# Patient Record
Sex: Female | Born: 1970
Health system: Southern US, Community
[De-identification: ages and names within clinical notes are randomized; demographics above are authoritative.]

## PROBLEM LIST (undated history)

## (undated) DIAGNOSIS — M1990 Unspecified osteoarthritis, unspecified site: Secondary | ICD-10-CM

## (undated) DIAGNOSIS — N83209 Unspecified ovarian cyst, unspecified side: Secondary | ICD-10-CM

## (undated) DIAGNOSIS — F419 Anxiety disorder, unspecified: Secondary | ICD-10-CM

## (undated) DIAGNOSIS — T7840XA Allergy, unspecified, initial encounter: Secondary | ICD-10-CM

## (undated) DIAGNOSIS — D649 Anemia, unspecified: Secondary | ICD-10-CM

## (undated) DIAGNOSIS — D219 Benign neoplasm of connective and other soft tissue, unspecified: Secondary | ICD-10-CM

## (undated) DIAGNOSIS — I10 Essential (primary) hypertension: Secondary | ICD-10-CM

## (undated) HISTORY — PX: CHOLECYSTECTOMY: SHX55

## (undated) HISTORY — PX: ELBOW SURGERY: SHX618

## (undated) HISTORY — DX: Unspecified ovarian cyst, unspecified side: N83.209

## (undated) HISTORY — DX: Anxiety disorder, unspecified: F41.9

## (undated) HISTORY — DX: Unspecified osteoarthritis, unspecified site: M19.90

## (undated) HISTORY — PX: TUBAL LIGATION: SHX77

## (undated) HISTORY — DX: Allergy, unspecified, initial encounter: T78.40XA

## (undated) HISTORY — DX: Benign neoplasm of connective and other soft tissue, unspecified: D21.9

## (undated) HISTORY — PX: COLONOSCOPY: SHX174

---

## 1998-01-23 ENCOUNTER — Other Ambulatory Visit: Admission: RE | Admit: 1998-01-23 | Discharge: 1998-01-23 | Payer: Self-pay | Admitting: Obstetrics and Gynecology

## 1998-04-02 ENCOUNTER — Emergency Department (HOSPITAL_COMMUNITY): Admission: EM | Admit: 1998-04-02 | Discharge: 1998-04-02 | Payer: Self-pay | Admitting: Emergency Medicine

## 1998-04-05 ENCOUNTER — Emergency Department (HOSPITAL_COMMUNITY): Admission: EM | Admit: 1998-04-05 | Discharge: 1998-04-05 | Payer: Self-pay | Admitting: Emergency Medicine

## 1999-01-14 ENCOUNTER — Other Ambulatory Visit: Admission: RE | Admit: 1999-01-14 | Discharge: 1999-01-14 | Payer: Self-pay | Admitting: *Deleted

## 1999-09-02 ENCOUNTER — Ambulatory Visit (HOSPITAL_BASED_OUTPATIENT_CLINIC_OR_DEPARTMENT_OTHER): Admission: RE | Admit: 1999-09-02 | Discharge: 1999-09-02 | Payer: Self-pay | Admitting: Orthopedic Surgery

## 1999-11-16 ENCOUNTER — Encounter: Admission: RE | Admit: 1999-11-16 | Discharge: 2000-02-14 | Payer: Self-pay | Admitting: Anesthesiology

## 2001-02-16 ENCOUNTER — Emergency Department (HOSPITAL_COMMUNITY): Admission: EM | Admit: 2001-02-16 | Discharge: 2001-02-16 | Payer: Self-pay | Admitting: *Deleted

## 2001-02-16 ENCOUNTER — Encounter: Payer: Self-pay | Admitting: Emergency Medicine

## 2001-11-07 ENCOUNTER — Other Ambulatory Visit: Admission: RE | Admit: 2001-11-07 | Discharge: 2001-11-07 | Payer: Self-pay | Admitting: Obstetrics and Gynecology

## 2004-01-26 ENCOUNTER — Encounter: Admission: RE | Admit: 2004-01-26 | Discharge: 2004-01-26 | Payer: Self-pay | Admitting: Sports Medicine

## 2005-09-03 ENCOUNTER — Emergency Department (HOSPITAL_COMMUNITY): Admission: EM | Admit: 2005-09-03 | Discharge: 2005-09-03 | Payer: Self-pay | Admitting: Emergency Medicine

## 2007-04-06 ENCOUNTER — Emergency Department (HOSPITAL_COMMUNITY): Admission: EM | Admit: 2007-04-06 | Discharge: 2007-04-06 | Payer: Self-pay | Admitting: Emergency Medicine

## 2009-07-25 ENCOUNTER — Emergency Department (HOSPITAL_COMMUNITY): Admission: EM | Admit: 2009-07-25 | Discharge: 2009-07-25 | Payer: Self-pay | Admitting: Family Medicine

## 2010-05-27 ENCOUNTER — Emergency Department (HOSPITAL_COMMUNITY): Admission: EM | Admit: 2010-05-27 | Discharge: 2010-05-27 | Payer: Self-pay | Admitting: Family Medicine

## 2010-06-17 ENCOUNTER — Emergency Department (HOSPITAL_COMMUNITY): Admission: EM | Admit: 2010-06-17 | Discharge: 2010-06-17 | Payer: Self-pay | Admitting: Family Medicine

## 2011-01-14 LAB — WET PREP, GENITAL
Clue Cells Wet Prep HPF POC: NONE SEEN
Trich, Wet Prep: NONE SEEN
Yeast Wet Prep HPF POC: NONE SEEN

## 2011-01-14 LAB — POCT URINALYSIS DIPSTICK
Glucose, UA: NEGATIVE mg/dL
Hgb urine dipstick: NEGATIVE
Ketones, ur: NEGATIVE mg/dL
Specific Gravity, Urine: >=1.03 (ref 1.005–1.030)
Urobilinogen, UA: 0.2 mg/dL (ref 0.0–1.0)

## 2011-01-14 LAB — GC/CHLAMYDIA PROBE AMP, GENITAL: GC Probe Amp, Genital: NEGATIVE

## 2011-01-15 LAB — POCT URINALYSIS DIP (DEVICE)
Bilirubin Urine: NEGATIVE
Nitrite: NEGATIVE
Protein, ur: NEGATIVE mg/dL
Urobilinogen, UA: 0.2 mg/dL (ref 0.0–1.0)
pH: 6.5 (ref 5.0–8.0)

## 2011-02-04 LAB — POCT URINALYSIS DIP (DEVICE)
Hgb urine dipstick: NEGATIVE
Nitrite: NEGATIVE
Protein, ur: NEGATIVE mg/dL
Urobilinogen, UA: 0.2 mg/dL (ref 0.0–1.0)
pH: 6.5 (ref 5.0–8.0)

## 2011-03-27 ENCOUNTER — Inpatient Hospital Stay (INDEPENDENT_AMBULATORY_CARE_PROVIDER_SITE_OTHER)
Admission: RE | Admit: 2011-03-27 | Discharge: 2011-03-27 | Disposition: A | Discharge status: Home / Self Care | Payer: 59 | Source: Ambulatory Visit | Attending: Emergency Medicine | Admitting: Emergency Medicine

## 2011-03-27 DIAGNOSIS — J069 Acute upper respiratory infection, unspecified: Secondary | ICD-10-CM

## 2011-06-06 ENCOUNTER — Inpatient Hospital Stay (INDEPENDENT_AMBULATORY_CARE_PROVIDER_SITE_OTHER)
Admission: RE | Admit: 2011-06-06 | Discharge: 2011-06-06 | Disposition: A | Discharge status: Home / Self Care | Payer: 59 | Source: Ambulatory Visit | Attending: Family Medicine | Admitting: Family Medicine

## 2011-06-06 DIAGNOSIS — N760 Acute vaginitis: Secondary | ICD-10-CM

## 2011-06-06 DIAGNOSIS — A499 Bacterial infection, unspecified: Secondary | ICD-10-CM

## 2011-06-07 LAB — GC/CHLAMYDIA PROBE AMP, GENITAL: Chlamydia, DNA Probe: NEGATIVE

## 2011-07-31 ENCOUNTER — Emergency Department (HOSPITAL_COMMUNITY)
Admission: EM | Admit: 2011-07-31 | Discharge: 2011-07-31 | Disposition: A | Discharge status: Home / Self Care | Payer: 59 | Attending: Emergency Medicine | Admitting: Emergency Medicine

## 2011-07-31 ENCOUNTER — Emergency Department (HOSPITAL_COMMUNITY): Payer: 59

## 2011-07-31 DIAGNOSIS — R079 Chest pain, unspecified: Secondary | ICD-10-CM | POA: Insufficient documentation

## 2011-07-31 DIAGNOSIS — Z79899 Other long term (current) drug therapy: Secondary | ICD-10-CM | POA: Insufficient documentation

## 2011-07-31 DIAGNOSIS — R110 Nausea: Secondary | ICD-10-CM | POA: Insufficient documentation

## 2011-07-31 DIAGNOSIS — I10 Essential (primary) hypertension: Secondary | ICD-10-CM | POA: Insufficient documentation

## 2011-07-31 DIAGNOSIS — M25519 Pain in unspecified shoulder: Secondary | ICD-10-CM | POA: Insufficient documentation

## 2011-07-31 LAB — POCT I-STAT, CHEM 8
BUN: 8 mg/dL (ref 6–23)
Creatinine, Ser: 0.7 mg/dL (ref 0.50–1.10)
Potassium: 2.9 mEq/L — ABNORMAL LOW (ref 3.5–5.1)
Sodium: 139 mEq/L (ref 135–145)

## 2011-07-31 LAB — CBC
MCV: 76.7 fL — ABNORMAL LOW (ref 78.0–100.0)
Platelets: 478 10*3/uL — ABNORMAL HIGH (ref 150–400)
RBC: 4.54 MIL/uL (ref 3.87–5.11)
RDW: 14.7 % (ref 11.5–15.5)
WBC: 7.6 10*3/uL (ref 4.0–10.5)

## 2011-07-31 LAB — BASIC METABOLIC PANEL
CO2: 27 mEq/L (ref 19–32)
Chloride: 101 mEq/L (ref 96–112)
Creatinine, Ser: 0.6 mg/dL (ref 0.50–1.10)
GFR calc Af Amer: >60 mL/min (ref >60)
Potassium: 3 mEq/L — ABNORMAL LOW (ref 3.5–5.1)

## 2011-07-31 LAB — DIFFERENTIAL
Basophils Absolute: 0 10*3/uL (ref 0.0–0.1)
Eosinophils Absolute: 0.1 10*3/uL (ref 0.0–0.7)
Lymphocytes Relative: 30 % (ref 12–46)
Lymphs Abs: 2.2 10*3/uL (ref 0.7–4.0)
Neutrophils Relative %: 63 % (ref 43–77)

## 2011-07-31 LAB — POCT I-STAT TROPONIN I
Troponin i, poc: 0.01 ng/mL (ref 0.00–0.08)
Troponin i, poc: 0.01 ng/mL (ref 0.00–0.08)

## 2011-07-31 LAB — POCT PREGNANCY, URINE: Preg Test, Ur: NEGATIVE

## 2011-09-18 ENCOUNTER — Emergency Department (INDEPENDENT_AMBULATORY_CARE_PROVIDER_SITE_OTHER)
Admission: EM | Admit: 2011-09-18 | Discharge: 2011-09-18 | Disposition: A | Discharge status: Home / Self Care | Payer: 59 | Source: Home / Self Care | Attending: Family Medicine | Admitting: Family Medicine

## 2011-09-18 ENCOUNTER — Encounter: Payer: Self-pay | Admitting: *Deleted

## 2011-09-18 DIAGNOSIS — L02219 Cutaneous abscess of trunk, unspecified: Secondary | ICD-10-CM

## 2011-09-18 DIAGNOSIS — L02211 Cutaneous abscess of abdominal wall: Secondary | ICD-10-CM

## 2011-09-18 HISTORY — DX: Essential (primary) hypertension: I10

## 2011-09-18 MED ORDER — FLUCONAZOLE 200 MG PO TABS: 200.0000 mg | ORAL_TABLET | Freq: Every day | ORAL | Status: AC

## 2011-09-18 MED ORDER — HYDROCODONE-ACETAMINOPHEN 5-325 MG PO TABS: 1.0000 | ORAL_TABLET | Freq: Three times a day (TID) | ORAL | Status: AC | PRN

## 2011-09-18 MED ORDER — DOXYCYCLINE HYCLATE 100 MG PO CAPS: 100.0000 mg | ORAL_CAPSULE | Freq: Two times a day (BID) | ORAL | Status: AC

## 2011-09-18 NOTE — ED Provider Notes (Signed)
History     CSN: [id number] Arrival date & time: 09/18/2011  5:46 PM   First MD Initiated Contact with Patient 09/18/11 1710      Chief Complaint  Patient presents with  . Recurrent Skin Infections    ABCESS RIGHT UPPER ABD ONSET X 3 DAYS  DRAINING X ONE REMAINS PAINFUL     (Consider location/radiation/quality/duration/timing/severity/associated sxs/prior treatment) Patient is a 40 y.o. female presenting with abscess. The history is provided by the patient.  Abscess  This is a new problem. The current episode started less than one week ago. The onset was gradual. Progression since onset: STARTED TO DRAIN TODAY. Affected Location: INFERIOR TO RIGHT BREAST. The problem is mild.  SHE STATES IT STARTED TO DRAIN TODAY. HX OF PRIOR EPISODES OF BOILS  Past Medical History  Diagnosis Date  . Hypertension     Past Surgical History  Procedure Date  . Cholecystectomy   . Elbow surgery     History reviewed. No pertinent family history.  History  Substance Use Topics  . Smoking status: Never Smoker   . Smokeless tobacco: Not on file  . Alcohol Use: No    OB History    Grav Para Term Preterm Abortions TAB SAB Ect Mult Living                  Review of Systems  Constitutional: Negative.   HENT: Negative.   Respiratory: Negative.   Cardiovascular: Negative.   Gastrointestinal: Negative.     Allergies  Review of patient's allergies indicates no known allergies.  Home Medications   Current Outpatient Rx  Name Route Sig Dispense Refill  . AMLODIPINE BESYLATE-VALSARTAN 10-160 MG PO TABS Oral Take 1 tablet by mouth daily.      Marland Kitchen FERROUS FUMARATE 325 (106 FE) MG PO TABS Oral Take 1 tablet by mouth.      Marland Kitchen HYDROCHLOROTHIAZIDE 25 MG PO TABS Oral Take 25 mg by mouth daily.      Marland Kitchen DOXYCYCLINE HYCLATE 100 MG PO CAPS Oral Take 1 capsule (100 mg total) by mouth 2 (two) times daily. 20 capsule 0  . HYDROCODONE-ACETAMINOPHEN 5-325 MG PO TABS Oral Take 1 tablet by mouth every 8  (eight) hours as needed for pain. 12 tablet 0    BP 110/61  Pulse 66  Temp(Src) 97.6 F (36.4 C) (Oral)  Resp 19  SpO2 98%  LMP 09/09/2011  Physical Exam  Nursing note and vitals reviewed. Constitutional: She appears well-developed and well-nourished.  HENT:  Head: Normocephalic.  Cardiovascular: Normal rate and regular rhythm.   Pulmonary/Chest: Effort normal and breath sounds normal.  Skin:       DRAINING ABSCESS RIGHT UPPER QUAD OF THE ABS. SURROUNDING INDURATION.     ED Course  Procedures (including critical care time)  Labs Reviewed - No data to display No results found.   1. Abscess of abdominal wall       MDM          Randa Spike, MD 09/18/11 1806

## 2012-01-30 ENCOUNTER — Emergency Department (HOSPITAL_COMMUNITY)
Admission: EM | Admit: 2012-01-30 | Discharge: 2012-01-30 | Disposition: A | Discharge status: Home / Self Care | Payer: 59 | Attending: Emergency Medicine | Admitting: Emergency Medicine

## 2012-01-30 ENCOUNTER — Other Ambulatory Visit: Payer: Self-pay

## 2012-01-30 ENCOUNTER — Encounter (HOSPITAL_COMMUNITY): Payer: Self-pay | Admitting: *Deleted

## 2012-01-30 ENCOUNTER — Emergency Department (HOSPITAL_COMMUNITY): Payer: 59

## 2012-01-30 DIAGNOSIS — I10 Essential (primary) hypertension: Secondary | ICD-10-CM | POA: Insufficient documentation

## 2012-01-30 DIAGNOSIS — R0602 Shortness of breath: Secondary | ICD-10-CM | POA: Insufficient documentation

## 2012-01-30 DIAGNOSIS — R109 Unspecified abdominal pain: Secondary | ICD-10-CM | POA: Insufficient documentation

## 2012-01-30 DIAGNOSIS — R079 Chest pain, unspecified: Secondary | ICD-10-CM | POA: Insufficient documentation

## 2012-01-30 DIAGNOSIS — R10819 Abdominal tenderness, unspecified site: Secondary | ICD-10-CM | POA: Insufficient documentation

## 2012-01-30 LAB — BASIC METABOLIC PANEL
BUN: 7 mg/dL (ref 6–23)
Calcium: 9.6 mg/dL (ref 8.4–10.5)
GFR calc non Af Amer: >90 mL/min (ref >90)
Glucose, Bld: 95 mg/dL (ref 70–99)

## 2012-01-30 LAB — CBC
HCT: 37.7 % (ref 36.0–46.0)
Hemoglobin: 12.3 g/dL (ref 12.0–15.0)
MCH: 25.4 pg — ABNORMAL LOW (ref 26.0–34.0)
MCHC: 32.6 g/dL (ref 30.0–36.0)
MCV: 77.7 fL — ABNORMAL LOW (ref 78.0–100.0)

## 2012-01-30 LAB — HEPATIC FUNCTION PANEL
Alkaline Phosphatase: 71 U/L (ref 39–117)
Total Bilirubin: 0.4 mg/dL (ref 0.3–1.2)

## 2012-01-30 LAB — LIPASE, BLOOD: Lipase: 21 U/L (ref 11–59)

## 2012-01-30 LAB — URINALYSIS, ROUTINE W REFLEX MICROSCOPIC
Bilirubin Urine: NEGATIVE
Glucose, UA: NEGATIVE mg/dL
Hgb urine dipstick: NEGATIVE
Protein, ur: NEGATIVE mg/dL
Specific Gravity, Urine: 1.019 (ref 1.005–1.030)
Urobilinogen, UA: 0.2 mg/dL (ref 0.0–1.0)

## 2012-01-30 MED ORDER — ONDANSETRON HCL 4 MG/2ML IJ SOLN
4.0000 mg | Freq: Once | INTRAMUSCULAR | Status: AC
  Administered 2012-01-30: 4 mg via INTRAVENOUS
  Filled 2012-01-30: qty 2

## 2012-01-30 MED ORDER — IBUPROFEN 600 MG PO TABS: 600.0000 mg | ORAL_TABLET | Freq: Four times a day (QID) | ORAL | Status: AC | PRN

## 2012-01-30 MED ORDER — MORPHINE SULFATE 4 MG/ML IJ SOLN
4.0000 mg | Freq: Once | INTRAMUSCULAR | Status: AC
  Administered 2012-01-30: 4 mg via INTRAVENOUS
  Filled 2012-01-30: qty 1

## 2012-01-30 MED ORDER — ASPIRIN 81 MG PO CHEW
324.0000 mg | CHEWABLE_TABLET | Freq: Once | ORAL | Status: AC
  Administered 2012-01-30: 324 mg via ORAL
  Filled 2012-01-30: qty 4

## 2012-01-30 MED ORDER — KETOROLAC TROMETHAMINE 30 MG/ML IJ SOLN
30.0000 mg | Freq: Once | INTRAMUSCULAR | Status: AC
  Administered 2012-01-30: 30 mg via INTRAVENOUS
  Filled 2012-01-30: qty 1

## 2012-01-30 MED ORDER — SODIUM CHLORIDE 0.9 % IV BOLUS (SEPSIS)
1000.0000 mL | Freq: Once | INTRAVENOUS | Status: AC
  Administered 2012-01-30: 1000 mL via INTRAVENOUS

## 2012-01-30 MED ORDER — POTASSIUM CHLORIDE 20 MEQ/15ML (10%) PO LIQD
60.0000 meq | Freq: Once | ORAL | Status: AC
  Administered 2012-01-30: 60 meq via ORAL
  Filled 2012-01-30: qty 45
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
## 2012-01-30 NOTE — ED Notes (Signed)
Several attempts to place iv-pt unable to relax for placement-MD notified
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
## 2012-01-30 NOTE — Discharge Instructions (Signed)
Abdominal Pain (Nonspecific) Your exam might not show the exact reason you have abdominal pain. Since there are many different causes of abdominal pain, another checkup and more tests may be needed. It is very important to follow up for lasting (persistent) or worsening symptoms. A possible cause of abdominal pain in any person who still has his or her appendix is acute appendicitis. Appendicitis is often hard to diagnose. Normal blood tests, urine tests, ultrasound, and CT scans do not completely rule out early appendicitis or other causes of abdominal pain. Sometimes, only the changes that happen over time will allow appendicitis and other causes of abdominal pain to be determined. Other potential problems that may require surgery may also take time to become more apparent. Because of this, it is important that you follow all of the instructions below. HOME CARE INSTRUCTIONS   Rest as much as possible.   Do not eat solid food until your pain is gone.   While adults or children have pain: A diet of water, weak decaffeinated tea, broth or bouillon, gelatin, oral rehydration solutions (ORS), frozen ice pops, or ice chips may be helpful.   When pain is gone in adults or children: Start a light diet (dry toast, crackers, applesauce, or white rice). Increase the diet slowly as long as it does not bother you. Eat no dairy products (including cheese and eggs) and no spicy, fatty, fried, or high-fiber foods.   Use no alcohol, caffeine, or cigarettes.   Take your regular medicines unless your caregiver told you not to.   Take any prescribed medicine as directed.   Only take over-the-counter or prescription medicines for pain, discomfort, or fever as directed by your caregiver. Do not give aspirin to children.  If your caregiver has given you a follow-up appointment, it is very important to keep that appointment. Not keeping the appointment could result in a permanent injury and/or lasting (chronic) pain  and/or disability. If there is any problem keeping the appointment, you must call to reschedule.  SEEK IMMEDIATE MEDICAL CARE IF:   Your pain is not gone in 24 hours.   Your pain becomes worse, changes location, or feels different.   You or your child has an oral temperature above 102 F (38.9 C), not controlled by medicine.   Your baby is older than 3 months with a rectal temperature of 102 F (38.9 C) or higher.   Your baby is 35 months old or younger with a rectal temperature of 100.4 F (38 C) or higher.   You have shaking chills.   You keep throwing up (vomiting) or cannot drink liquids.   There is blood in your vomit or you see blood in your bowel movements.   Your bowel movements become dark or black.   You have frequent bowel movements.   Your bowel movements stop (become blocked) or you cannot pass gas.   You have bloody, frequent, or painful urination.   You have yellow discoloration in the skin or whites of the eyes.   Your stomach becomes bloated or bigger.   You have dizziness or fainting.   You have chest or back pain.  MAKE SURE YOU:   Understand these instructions.   Will watch your condition.   Will get help right away if you are not doing well or get worse.  Document Released: 10/17/2005 Document Revised: 10/06/2011 Document Reviewed: 09/14/2009 [facility] Patient Information 2012 [place], [place].  Chest Pain (Nonspecific) It is often hard to give a specific diagnosis  for the cause of chest pain. There is always a chance that your pain could be related to something serious, such as a heart attack or a blood clot in the lungs. You need to follow up with your caregiver for further evaluation. CAUSES   Heartburn.   Pneumonia or bronchitis.   Anxiety or stress.   Inflammation around your heart (pericarditis) or lung (pleuritis or pleurisy).   A blood clot in the lung.   A collapsed lung (pneumothorax). It can develop suddenly on its own  (spontaneous pneumothorax) or from injury (trauma) to the chest.   Shingles infection (herpes zoster virus).  The chest wall is composed of bones, muscles, and cartilage. Any of these can be the source of the pain.  The bones can be bruised by injury.   The muscles or cartilage can be strained by coughing or overwork.   The cartilage can be affected by inflammation and become sore (costochondritis).  DIAGNOSIS  Lab tests or other studies, such as X-rays, electrocardiography, stress testing, or cardiac imaging, may be needed to find the cause of your pain.  TREATMENT   Treatment depends on what may be causing your chest pain. Treatment may include:   Acid blockers for heartburn.   Anti-inflammatory medicine.   Pain medicine for inflammatory conditions.   Antibiotics if an infection is present.   You may be advised to change lifestyle habits. This includes stopping smoking and avoiding alcohol, caffeine, and chocolate.   You may be advised to keep your head raised (elevated) when sleeping. This reduces the chance of acid going backward from your stomach into your esophagus.   Most of the time, nonspecific chest pain will improve within 2 to 3 days with rest and mild pain medicine.  HOME CARE INSTRUCTIONS   If antibiotics were prescribed, take your antibiotics as directed. Finish them even if you start to feel better.   For the next few days, avoid physical activities that bring on chest pain. Continue physical activities as directed.   Do not smoke.   Avoid drinking alcohol.   Only take over-the-counter or prescription medicine for pain, discomfort, or fever as directed by your caregiver.   Follow your caregiver's suggestions for further testing if your chest pain does not go away.   Keep any follow-up appointments you made. If you do not go to an appointment, you could develop lasting (chronic) problems with pain. If there is any problem keeping an appointment, you must call  to reschedule.  SEEK MEDICAL CARE IF:   You think you are having problems from the medicine you are taking. Read your medicine instructions carefully.   Your chest pain does not go away, even after treatment.   You develop a rash with blisters on your chest.  SEEK IMMEDIATE MEDICAL CARE IF:   You have increased chest pain or pain that spreads to your arm, neck, jaw, back, or abdomen.   You develop shortness of breath, an increasing cough, or you are coughing up blood.   You have severe back or abdominal pain, feel nauseous, or vomit.   You develop severe weakness, fainting, or chills.   You have a fever.  THIS IS AN EMERGENCY. Do not wait to see if the pain will go away. Get medical help at once. Call your local emergency services (911 in [place] Do not drive yourself to the hospital. MAKE SURE YOU:   Understand these instructions.   Will watch your condition.   Will get help right  away if you are not doing well or get worse.  Document Released: 07/27/2005 Document Revised: 10/06/2011 Document Reviewed: 05/22/2008 [facility] Patient Information 2012 [place], [place].   RESOURCE GUIDE  Dental Problems  Patients with Medicaid: [facility] [phone] [address]                                           [phone] Phone:  [phone]                                                   Phone:  [phone]  If unable to pay or uninsured, contact:  [facility] or [facility]. to become qualified for the adult dental clinic.  Chronic Pain Problems Contact [facility]  [phone] Patients need to be referred by their primary care doctor.  Insufficient Money for Medicine Contact [employer]:  call "211" or [facility] [phone].  No Primary Care Doctor Call [facility]  [phone] Other agencies that provide inexpensive medical care    [facility]  [phone]    [facility]  [phone]    [facility]  [phone]    [facility]  [phone]    [facility]  [phone]    [facility]  [phone]  Psychological Services [facility]  [phone] [facility] [place]  [phone] [facility]   [phone] (emergency services [phone])  Abuse/Neglect [facility] Child Abuse Hotline [phone] [facility] Child Abuse Hotline (715)113-4495 (After Hours)  Emergency Shelter [facility] [phone]  M[facility] Homes [facility] [place] [facility] [phone] [place] Alert Services [phone]  MRSA Hotline #:   [phone]    [facility] Resources  [facility] [place]  [employer]                           [facility] [address] [place]                     [address]         [address]  Blondell Reveal Phone:  [phone]                                  Phone:  [phone]                   Phone:  [phone]  [facility] Phone:  [phone]  [facility] Child Abuse Hotline [phone] [phone] (After Hours)

## 2012-08-10 ENCOUNTER — Emergency Department (HOSPITAL_COMMUNITY)
Admission: EM | Admit: 2012-08-10 | Discharge: 2012-08-10 | Disposition: A | Discharge status: Home / Self Care | Payer: 59 | Source: Home / Self Care | Attending: Emergency Medicine | Admitting: Emergency Medicine

## 2012-08-10 ENCOUNTER — Encounter (HOSPITAL_COMMUNITY): Payer: Self-pay | Admitting: *Deleted

## 2012-08-10 DIAGNOSIS — S335XXA Sprain of ligaments of lumbar spine, initial encounter: Secondary | ICD-10-CM

## 2012-08-10 DIAGNOSIS — S39012A Strain of muscle, fascia and tendon of lower back, initial encounter: Secondary | ICD-10-CM

## 2012-08-10 HISTORY — DX: Anemia, unspecified: D64.9

## 2012-08-10 LAB — POCT URINALYSIS DIP (DEVICE)
Bilirubin Urine: NEGATIVE
Nitrite: NEGATIVE
Specific Gravity, Urine: 1.025 (ref 1.005–1.030)
pH: 6.5 (ref 5.0–8.0)

## 2012-08-10 MED ORDER — METHOCARBAMOL 500 MG PO TABS: 500.0000 mg | ORAL_TABLET | Freq: Three times a day (TID) | ORAL | Status: DC

## 2012-08-10 MED ORDER — TRAMADOL HCL 50 MG PO TABS: 100.0000 mg | ORAL_TABLET | Freq: Three times a day (TID) | ORAL | Status: DC | PRN

## 2012-08-10 MED ORDER — MELOXICAM 15 MG PO TABS: 15.0000 mg | ORAL_TABLET | Freq: Every day | ORAL | Status: DC

## 2012-08-10 NOTE — ED Notes (Signed)
Started with lower back pain Wed while at work; went to PCP that afternoon - was told urine was completely neg, and pain was likely from a pulled muscle (patient started Zumba 3 wks ago); was told to take 800mg  IBU, but it's not helping.  Pain is worse when reaching or bending.  Denies any radiculopathy or parasthesias.

## 2012-08-10 NOTE — ED Provider Notes (Signed)
Chief Complaint  Patient presents with  . Back Pain    History of Present Illness:   The patient is a 41 year old female who has had a three-day history of pain in the lower lumbar spine around the waistline. 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 The pain is rated as 7 to 8/10 in intensity. It's worse with bending and better with heat. There is no radiation down the legs, no numbness, tingling, weakness, dysuria, frequency, urgency, hematuria, or urinary incontinence. There is no incontinence of stool or saddle anesthesia. No fever, chills, unintentional weight loss, or abdominal pain. The patient's primary care physician, Dr. Archie Endo at onset of pain and she had a urinalysis which is normal. She was treated with ibuprofen, but feels no better.  Review of Systems:  Other than noted above, the patient denies any of the following symptoms: Systemic:  No fever, chills, severe fatigue, or unexplained weight loss. GI:  No abdominal pain, nausea, vomiting, diarrhea, constipation, incontinence of bowel, or blood in stool. GU:  No dysuria, frequency, urgency, or hematuria. No incontinence of urine or difficulty urinating.  M-S:  No neck pain, joint pain, arthritis, or myalgias. Neuro:  No paresthesias, saddle anesthesia, muscular weakness, or progressive neurological deficit.  PMFSH:  Past medical history, family history, social history, meds, and allergies were reviewed. Specifically, there is no history of cancer, major trauma, osteoporosis, immunosuppression, HIV, or IV or injection drug use.   Physical Exam:   Vital signs:  BP 139/82  Pulse 82  Temp 99.1 F (37.3 C) (Oral)  Resp 18  SpO2 100%  LMP 07/23/2012 General:  Alert, oriented, in no distress. Abdomen:  Soft, non-tender.  No organomegaly or mass.  No pulsatile midline abdominal mass or bruit. Back:  There is pain to palpation bilaterally at the waistline area. Her back had a full range of motion  with slight pain. Straight leg raising was negative. Lasegue's sign was negative. Neuro:  Normal muscle strength, sensations and DTRs. Extremities: Pedal pulses were full, there was no edema. Skin:  Clear, warm and dry.  No rash.  Labs:   Results for orders placed during the hospital encounter of 08/10/12  POCT URINALYSIS DIP (DEVICE)      Component Value Range   Glucose, UA NEGATIVE  NEGATIVE mg/dL   Bilirubin Urine NEGATIVE  NEGATIVE   Ketones, ur NEGATIVE  NEGATIVE mg/dL   Specific Gravity, Urine 1.025  1.005 - 1.030   Hgb urine dipstick SMALL (*) NEGATIVE   pH 6.5  5.0 - 8.0   Protein, ur 30 (*) NEGATIVE mg/dL   Urobilinogen, UA 0.2  0.0 - 1.0 mg/dL   Nitrite NEGATIVE  NEGATIVE   Leukocytes, UA NEGATIVE  NEGATIVE  POCT PREGNANCY, URINE      Component Value Range   Preg Test, Ur NEGATIVE  NEGATIVE    Assessment:  The encounter diagnosis was Lumbar strain.  Plan:   1.  The following meds were prescribed:   New Prescriptions   MELOXICAM (MOBIC) 15 MG TABLET    Take 1 tablet (15 mg total) by mouth daily.   METHOCARBAMOL (ROBAXIN) 500 MG TABLET    Take 1 tablet (500 mg total) by mouth 3 (three) times daily.   TRAMADOL (ULTRAM) 50 MG TABLET    Take 2 tablets (100 mg total) by mouth every 8 (eight) hours as needed for pain.   2.  The patient was instructed in symptomatic care and handouts were  given. 3.  The patient was told to return if becoming worse in any way, if no better in 2 weeks, and given some red flag symptoms that would indicate earlier return. 4.  The patient was encouraged to try to be as active as possible and given some exercises to do followed by moist heat.    Megan Likes, MD 08/10/12 Jerene Bears

## 2012-08-30 ENCOUNTER — Emergency Department (HOSPITAL_COMMUNITY): Payer: No Typology Code available for payment source

## 2012-08-30 ENCOUNTER — Emergency Department (HOSPITAL_COMMUNITY)
Admission: EM | Admit: 2012-08-30 | Discharge: 2012-08-30 | Disposition: A | Discharge status: Home / Self Care | Payer: No Typology Code available for payment source | Attending: Emergency Medicine | Admitting: Emergency Medicine

## 2012-08-30 DIAGNOSIS — Z79899 Other long term (current) drug therapy: Secondary | ICD-10-CM | POA: Insufficient documentation

## 2012-08-30 DIAGNOSIS — D649 Anemia, unspecified: Secondary | ICD-10-CM | POA: Insufficient documentation

## 2012-08-30 DIAGNOSIS — M25529 Pain in unspecified elbow: Secondary | ICD-10-CM | POA: Insufficient documentation

## 2012-08-30 DIAGNOSIS — M549 Dorsalgia, unspecified: Secondary | ICD-10-CM | POA: Insufficient documentation

## 2012-08-30 DIAGNOSIS — Y9389 Activity, other specified: Secondary | ICD-10-CM | POA: Insufficient documentation

## 2012-08-30 DIAGNOSIS — Y92410 Unspecified street and highway as the place of occurrence of the external cause: Secondary | ICD-10-CM | POA: Insufficient documentation

## 2012-08-30 DIAGNOSIS — I10 Essential (primary) hypertension: Secondary | ICD-10-CM | POA: Insufficient documentation

## 2012-08-30 DIAGNOSIS — M542 Cervicalgia: Secondary | ICD-10-CM | POA: Insufficient documentation

## 2012-08-30 MED ORDER — ONDANSETRON HCL 4 MG PO TABS: 4.0000 mg | ORAL_TABLET | Freq: Once | ORAL | Status: DC

## 2012-08-30 MED ORDER — DIAZEPAM 5 MG/ML IJ SOLN
10.0000 mg | Freq: Once | INTRAMUSCULAR | Status: AC
  Administered 2012-08-30: 10 mg via INTRAMUSCULAR
  Filled 2012-08-30: qty 2

## 2012-08-30 MED ORDER — ONDANSETRON 4 MG PO TBDP
4.0000 mg | ORAL_TABLET | Freq: Once | ORAL | Status: AC
  Administered 2012-08-30: 4 mg via ORAL
  Filled 2012-08-30: qty 1

## 2012-08-30 MED ORDER — HYDROCODONE-ACETAMINOPHEN 5-325 MG PO TABS
2.0000 | ORAL_TABLET | Freq: Once | ORAL | Status: AC
  Administered 2012-08-30: 2 via ORAL
  Filled 2012-08-30: qty 2

## 2012-08-30 MED ORDER — HYDROCODONE-ACETAMINOPHEN 5-325 MG PO TABS: 2.0000 | ORAL_TABLET | Freq: Four times a day (QID) | ORAL | Status: DC | PRN

## 2012-08-30 MED ORDER — DIAZEPAM 5 MG PO TABS: 5.0000 mg | ORAL_TABLET | Freq: Two times a day (BID) | ORAL | Status: DC
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
## 2012-08-30 NOTE — ED Notes (Signed)
Pt is in xray
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
## 2012-08-31 ENCOUNTER — Emergency Department (INDEPENDENT_AMBULATORY_CARE_PROVIDER_SITE_OTHER)
Admission: EM | Admit: 2012-08-31 | Discharge: 2012-08-31 | Disposition: A | Discharge status: Home / Self Care | Payer: Self-pay | Source: Home / Self Care

## 2012-08-31 ENCOUNTER — Encounter (HOSPITAL_COMMUNITY): Payer: Self-pay

## 2012-08-31 DIAGNOSIS — S60229A Contusion of unspecified hand, initial encounter: Secondary | ICD-10-CM
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
## 2012-08-31 NOTE — ED Provider Notes (Signed)
Medical screening examination/treatment/procedure(s) were performed by non-physician practitioner and as supervising physician I was immediately available for consultation/collaboration.  Raynald Blend, MD 08/31/12 1719
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
## 2012-08-31 NOTE — ED Notes (Signed)
discussed wait time w patient

## 2012-08-31 NOTE — ED Provider Notes (Signed)
Medical screening examination/treatment/procedure(s) were performed by non-physician practitioner and as supervising physician I was immediately available for consultation/collaboration.  Flint Melter, MD 08/31/12 [phone]

## 2012-10-18 ENCOUNTER — Other Ambulatory Visit (HOSPITAL_COMMUNITY): Payer: Self-pay | Admitting: Family Medicine

## 2012-11-06 ENCOUNTER — Other Ambulatory Visit (HOSPITAL_COMMUNITY): Payer: Self-pay | Admitting: Family Medicine

## 2012-11-06 DIAGNOSIS — N920 Excessive and frequent menstruation with regular cycle: Secondary | ICD-10-CM

## 2013-02-07 ENCOUNTER — Emergency Department (HOSPITAL_COMMUNITY)
Admission: EM | Admit: 2013-02-07 | Discharge: 2013-02-07 | Disposition: A | Discharge status: Home / Self Care | Payer: 59 | Source: Home / Self Care | Attending: Emergency Medicine | Admitting: Emergency Medicine

## 2013-02-07 ENCOUNTER — Encounter (HOSPITAL_COMMUNITY): Payer: Self-pay | Admitting: *Deleted

## 2013-02-07 DIAGNOSIS — R21 Rash and other nonspecific skin eruption: Secondary | ICD-10-CM

## 2013-02-07 DIAGNOSIS — M67432 Ganglion, left wrist: Secondary | ICD-10-CM

## 2013-02-07 MED ORDER — SULFAMETHOXAZOLE-TRIMETHOPRIM 800-160 MG PO TABS: 1.0000 | ORAL_TABLET | Freq: Two times a day (BID) | ORAL | Status: DC

## 2013-02-07 NOTE — ED Provider Notes (Signed)
History     CSN: [id number]  Arrival date & time 02/07/13  1002   First MD Initiated Contact with Patient 02/07/13 1022      Chief Complaint  Patient presents with  . Insect Bite    (Consider location/radiation/quality/duration/timing/severity/associated sxs/prior treatment) The history is provided by the patient.    Past Medical History  Diagnosis Date  . Hypertension   . Anemia     Past Surgical History  Procedure Laterality Date  . Cholecystectomy    . Elbow surgery    . Tubal ligation      No family history on file.  History  Substance Use Topics  . Smoking status: Never Smoker   . Smokeless tobacco: Not on file  . Alcohol Use: No    OB History   Grav Para Term Preterm Abortions TAB SAB Ect Mult Living                  Review of Systems  Skin: Positive for rash.  All other systems reviewed and are negative.    Allergies  Review of patient's allergies indicates no known allergies.  Home Medications   Current Outpatient Rx  Name  Route  Sig  Dispense  Refill  . amLODipine-valsartan (EXFORGE) 10-160 MG per tablet   Oral   Take 1 tablet by mouth daily.           . ferrous fumarate (HEMOCYTE - 106 MG FE) 325 (106 FE) MG TABS   Oral   Take 1 tablet by mouth.           . fluconazole (DIFLUCAN) 150 MG tablet   Oral   Take 1 tablet (150 mg total) by mouth once.   1 tablet   0   . hydrochlorothiazide (HYDRODIURIL) 25 MG tablet   Oral   Take 25 mg by mouth daily.           Marland Kitchen HYDROcodone-acetaminophen (NORCO/VICODIN) 5-325 MG per tablet   Oral   Take 2 tablets by mouth every 6 (six) hours as needed for pain.   15 tablet   0   . meloxicam (MOBIC) 15 MG tablet   Oral   Take 1 tablet (15 mg total) by mouth daily.   15 tablet   0   . methocarbamol (ROBAXIN) 500 MG tablet   Oral   Take 1 tablet (500 mg total) by mouth 3 (three) times daily.   30 tablet   0   . norethindrone-ethinyl estradiol (MICROGESTIN,JUNEL,LOESTRIN) 1-20  MG-MCG tablet   Oral   Take 1 tablet by mouth daily.         Marland Kitchen sulfamethoxazole-trimethoprim (SEPTRA DS) 800-160 MG per tablet   Oral   Take 1 tablet by mouth every 12 (twelve) hours.   20 tablet   0   . traMADol (ULTRAM) 50 MG tablet   Oral   Take 2 tablets (100 mg total) by mouth every 8 (eight) hours as needed for pain.   30 tablet   0     BP 113/87  Pulse 71  Temp(Src) 99.4 F (37.4 C) (Oral)  Resp 18  SpO2 100%  LMP 02/04/2013  Physical Exam  Constitutional: She appears well-developed.  Musculoskeletal: Normal range of motion. She exhibits tenderness.  Small ganglion cyst left wrist  Skin: Skin is warm. Rash noted.   small blisters noted left lower leg  ED Course  Procedures (including critical care time)  Labs Reviewed - No data to display No results found.  1. Rash and nonspecific skin eruption   2. Ganglion cyst of wrist, left       MDM          Jani Files, MD 02/27/13 [phone]

## 2013-02-07 NOTE — ED Notes (Signed)
Pt  Presents  With  2  Issues  She noticed  A  Small    Bump  That itches  On lefet  Lower  Leg  About 1  Week  Ago      unknwn as  To  Etiology      She  Has  Been  Scratching it  And  Has  Become  Red  And  Irritated  Also  Noticed  sm  Bump on l  Wrist  Today     denys  specefic injury  But types  A  Lot

## 2013-02-12 ENCOUNTER — Telehealth (HOSPITAL_COMMUNITY): Payer: Self-pay | Admitting: *Deleted

## 2013-02-12 MED ORDER — FLUCONAZOLE 150 MG PO TABS: 150.0000 mg | ORAL_TABLET | Freq: Once | ORAL | Status: DC

## 2013-02-12 NOTE — ED Provider Notes (Signed)
Addendum: Patient had an allergic reaction to Septra, so this was stopped. She was switched to another antibiotic which caused a yeast infection. We'll send a prescription for Diflucan to her pharmacy.  Reuben Likes, MD 02/12/13 2100

## 2013-02-12 NOTE — ED Notes (Signed)
Pt. called on VM @ 1547 and said she had been switched to a different antibiotic due to allergic reaction to Septra DS.  She has developed a yeast infection from this antibiotic and requests Diflucan be sent to the [facility].  I called pt. one time to clarify her need and left a message. I listened to her voicemail and asked Dr. Lorenz Coaster if he would do the order.  He e-prescribed the Diflucan to the [facility]. I called pt. back and left her another message telling her what we sent and where to pick it up.  I told her to call back tomorrow if any further questions and left my number and [phone]. Vassie Moselle 02/12/2013

## 2013-02-12 NOTE — ED Notes (Signed)
Pt. called back and said someone took care of it earlier today and she had already taken the pill. I apologized for bothering her and explained that I did not see a note that it was handled or where the medication had been ordered. Vassie Moselle 02/12/2013

## 2013-06-02 ENCOUNTER — Emergency Department (HOSPITAL_COMMUNITY)
Admission: EM | Admit: 2013-06-02 | Discharge: 2013-06-02 | Disposition: A | Discharge status: Home / Self Care | Payer: 59 | Source: Home / Self Care | Attending: Emergency Medicine | Admitting: Emergency Medicine

## 2013-06-02 ENCOUNTER — Encounter (HOSPITAL_COMMUNITY): Payer: Self-pay | Admitting: *Deleted

## 2013-06-02 DIAGNOSIS — J310 Chronic rhinitis: Secondary | ICD-10-CM

## 2013-06-02 DIAGNOSIS — I10 Essential (primary) hypertension: Secondary | ICD-10-CM

## 2013-06-02 MED ORDER — AMLODIPINE BESYLATE-VALSARTAN 10-160 MG PO TABS: 1.0000 | ORAL_TABLET | Freq: Every day | ORAL | Status: DC

## 2013-06-02 MED ORDER — HYDROCHLOROTHIAZIDE 25 MG PO TABS: 25.0000 mg | ORAL_TABLET | Freq: Every day | ORAL | Status: DC

## 2013-06-02 NOTE — ED Notes (Signed)
States she will run out of her Exforge after today, and will need a refill on her HCTZ also until she is able to get appt with PCP in next 4 wks.  Also c/o post-nasal drainage, itchy throat (no pain), slight nasal congestion, sneezing over past 3 days.  Has been taking Alka Seltzer Cold Plus and Sudafed without relief.

## 2013-06-02 NOTE — ED Provider Notes (Signed)
Chief Complaint:   Chief Complaint  Patient presents with  . Medication Refill  . Allergic Rhinitis     History of Present Illness:   Megan Mitchell is a 42 year old female who has had a 3 to four-day history of post nasal drip, scratchy throat, sneezing, headache, and a dry cough. She denies any fever, chills, nasal congestion, rhinorrhea, swollen glands, wheezing, chest pain, or GI symptoms. She has no known sick exposures and denies any history of asthma or allergies.  She also needs refills on her blood pressure medicine. Her primary care physician, Dr. Archie Endo, is out of the state for a couple of months. She needs refills on Exforge 10/160 and hydrochlorothiazide 25 mg per day.  Review of Systems:  Other than noted above, the patient denies any of the following symptoms: Systemic:  No fevers, chills, sweats, weight loss or gain, fatigue, or tiredness. Eye:  No redness or discharge. ENT:  No ear pain, drainage, headache, nasal congestion, drainage, sinus pressure, difficulty swallowing, or sore throat. Neck:  No neck pain or swollen glands. Lungs:  No cough, sputum production, hemoptysis, wheezing, chest tightness, shortness of breath or chest pain. GI:  No abdominal pain, nausea, vomiting or diarrhea.  PMFSH:  Past medical history, family history, social history, meds, and allergies were reviewed. She's allergic to sulfa. She has high blood pressure. She takes an iron supplement for anemia.  Physical Exam:   Vital signs:  BP 131/84  Pulse 76  Temp(Src) 98.3 F (36.8 C) (Oral)  Resp 16  SpO2 97%  LMP 05/27/2013 General:  Alert and oriented.  In no distress.  Skin warm and dry. Eye:  No conjunctival injection or drainage. Lids were normal. ENT:  TMs and canals were normal, without erythema or inflammation.  Nasal mucosa was clear and uncongested, without drainage.  Mucous membranes were moist.  Pharynx was clear with no exudate or drainage.  There were no oral ulcerations or  lesions. Neck:  Supple, no adenopathy, tenderness or mass. Lungs:  No respiratory distress.  Lungs were clear to auscultation, without wheezes, rales or rhonchi.  Breath sounds were clear and equal bilaterally.  Heart:  Regular rhythm, without gallops, murmers or rubs. Skin:  Clear, warm, and dry, without rash or lesions.  Assessment:  The primary encounter diagnosis was Rhinitis. A diagnosis of Hypertension was also pertinent to this visit.  This could be a virus or could be allergic rhinitis. I have suggested she use over-the-counter antihistamines and nasal sprays, throat lozenges and hot salt water gargles. Return if no improvement in a total of 7-10 days.  She was provided with enough refills on her blood pressure medicine to last for 2 months.  Plan:   1.  The following meds were prescribed:   Discharge Medication List as of 06/02/2013  9:33 AM    START taking these medications   Details  !! amLODipine-valsartan (EXFORGE) 10-160 MG per tablet Take 1 tablet by mouth daily., Starting 06/02/2013, Until Discontinued, Normal    !! hydrochlorothiazide (HYDRODIURIL) 25 MG tablet Take 1 tablet (25 mg total) by mouth daily., Starting 06/02/2013, Until Discontinued, Normal     !! - Potential duplicate medications found. Please discuss with provider.     2.  The patient was instructed in symptomatic care and handouts were given. 3.  The patient was told to return if becoming worse in any way, if no better in 3 or 4 days, and given some red flag symptoms such as fever or  difficulty breathing that would indicate earlier return. 4.  Follow up here if necessary.      Reuben Likes, MD 06/02/13 [phone]

## 2013-07-30 ENCOUNTER — Encounter (HOSPITAL_COMMUNITY): Payer: Self-pay | Admitting: Emergency Medicine

## 2013-07-30 ENCOUNTER — Emergency Department (HOSPITAL_COMMUNITY)
Admission: EM | Admit: 2013-07-30 | Discharge: 2013-07-30 | Disposition: A | Discharge status: Home / Self Care | Payer: 59 | Source: Home / Self Care

## 2013-07-30 DIAGNOSIS — I10 Essential (primary) hypertension: Secondary | ICD-10-CM

## 2013-07-30 MED ORDER — HYDROCHLOROTHIAZIDE 25 MG PO TABS: 25.0000 mg | ORAL_TABLET | Freq: Every day | ORAL | Status: DC

## 2013-07-30 MED ORDER — AMLODIPINE BESYLATE-VALSARTAN 10-160 MG PO TABS: 1.0000 | ORAL_TABLET | Freq: Every day | ORAL | Status: DC

## 2013-07-30 NOTE — ED Notes (Signed)
Reports needing refill on bp med. Exforge. Unable to see primary due to balance owed. Pt states will run out on Thursday. Pt voices no concerns at this time.

## 2013-07-30 NOTE — ED Provider Notes (Signed)
CSN: [id number]     Arrival date & time 07/30/13  1751 History   First MD Initiated Contact with Patient 07/30/13 1850     Chief Complaint  Patient presents with  . Medication Refill    needs refill on BP medication.    (Consider location/radiation/quality/duration/timing/severity/associated sxs/prior Treatment) HPI Comments: 42 year old obese female with a history of hypertension presents to the urgent care to have her medications refilled. She states she has her PCP money and they will not see her. She was here a little over 2 months ago for the same reason and received his two-month supply. She has been told by the PCP that she needs lab work and an office visit to continue her blood pressure medication. She is having no complaints.   Past Medical History  Diagnosis Date  . Hypertension   . Anemia    Past Surgical History  Procedure Laterality Date  . Cholecystectomy    . Elbow surgery    . Tubal ligation     History reviewed. No pertinent family history. History  Substance Use Topics  . Smoking status: Never Smoker   . Smokeless tobacco: Not on file  . Alcohol Use: No   OB History   Grav Para Term Preterm Abortions TAB SAB Ect Mult Living                 Review of Systems  HENT: Negative.   Respiratory: Negative.   Cardiovascular: Negative.   Gastrointestinal: Negative.   Genitourinary: Negative.   Neurological: Negative.   All other systems reviewed and are negative.    Allergies  Sulfa antibiotics  Home Medications   Current Outpatient Rx  Name  Route  Sig  Dispense  Refill  . amLODipine-valsartan (EXFORGE) 10-160 MG per tablet   Oral   Take 1 tablet by mouth daily.           . hydrochlorothiazide (HYDRODIURIL) 25 MG tablet   Oral   Take 25 mg by mouth daily.           . hydrochlorothiazide (HYDRODIURIL) 25 MG tablet   Oral   Take 1 tablet (25 mg total) by mouth daily.   30 tablet   1   . meloxicam (MOBIC) 15 MG tablet   Oral   Take 1  tablet (15 mg total) by mouth daily.   15 tablet   0   . norethindrone-ethinyl estradiol (MICROGESTIN,JUNEL,LOESTRIN) 1-20 MG-MCG tablet   Oral   Take 1 tablet by mouth daily.         Marland Kitchen amLODipine-valsartan (EXFORGE) 10-160 MG per tablet   Oral   Take 1 tablet by mouth daily.   30 tablet   1   . amLODipine-valsartan (EXFORGE) 10-160 MG per tablet   Oral   Take 1 tablet by mouth daily.   30 tablet   0   . ferrous fumarate (HEMOCYTE - 106 MG FE) 325 (106 FE) MG TABS   Oral   Take 1 tablet by mouth.           . fluconazole (DIFLUCAN) 150 MG tablet   Oral   Take 1 tablet (150 mg total) by mouth once.   1 tablet   0   . hydrochlorothiazide (HYDRODIURIL) 25 MG tablet   Oral   Take 1 tablet (25 mg total) by mouth daily.   30 tablet   0   . HYDROcodone-acetaminophen (NORCO/VICODIN) 5-325 MG per tablet   Oral   Take 2 tablets by mouth  every 6 (six) hours as needed for pain.   15 tablet   0   . methocarbamol (ROBAXIN) 500 MG tablet   Oral   Take 1 tablet (500 mg total) by mouth 3 (three) times daily.   30 tablet   0   . sulfamethoxazole-trimethoprim (SEPTRA DS) 800-160 MG per tablet   Oral   Take 1 tablet by mouth every 12 (twelve) hours.   20 tablet   0   . traMADol (ULTRAM) 50 MG tablet   Oral   Take 2 tablets (100 mg total) by mouth every 8 (eight) hours as needed for pain.   30 tablet   0    BP 135/85  Pulse 70  Temp(Src) 98.3 F (36.8 C) (Oral)  Resp 14  SpO2 98%  LMP 07/23/2013 Physical Exam  Nursing note and vitals reviewed. Constitutional: She is oriented to person, place, and time. She appears well-developed and well-nourished. No distress.  Neck: Normal range of motion. Neck supple.  Pulmonary/Chest: Effort normal and breath sounds normal.  Musculoskeletal: She exhibits no edema.  Neurological: She is alert and oriented to person, place, and time. She exhibits normal muscle tone.  Skin: No rash noted.    ED Course  Procedures  (including critical care time) Labs Review Labs Reviewed - No data to display Imaging Review No results found.  MDM   1. HTN (hypertension)     Rx for Exforge 10/161 daily #30 Rx for HCTZ 25 mg one daily for blood pressure #30. Followup with your PCP soon as possible.   Hayden Rasmussen, NP 07/30/13 [phone]

## 2013-07-30 NOTE — ED Provider Notes (Signed)
Medical screening examination/treatment/procedure(s) were performed by non-physician practitioner and as supervising physician I was immediately available for consultation/collaboration.  Leslee Home, M.D.  Reuben Likes, MD 07/30/13 2250

## 2013-09-01 ENCOUNTER — Emergency Department (INDEPENDENT_AMBULATORY_CARE_PROVIDER_SITE_OTHER)
Admission: EM | Admit: 2013-09-01 | Discharge: 2013-09-01 | Disposition: A | Discharge status: Home / Self Care | Payer: 59 | Source: Home / Self Care

## 2013-09-01 ENCOUNTER — Encounter (HOSPITAL_COMMUNITY): Payer: Self-pay | Admitting: Emergency Medicine

## 2013-09-01 DIAGNOSIS — N921 Excessive and frequent menstruation with irregular cycle: Secondary | ICD-10-CM

## 2013-09-01 DIAGNOSIS — N920 Excessive and frequent menstruation with regular cycle: Secondary | ICD-10-CM

## 2013-09-01 DIAGNOSIS — I10 Essential (primary) hypertension: Secondary | ICD-10-CM

## 2013-09-01 MED ORDER — AMLODIPINE BESYLATE-VALSARTAN 10-160 MG PO TABS: 1.0000 | ORAL_TABLET | Freq: Every day | ORAL | Status: DC

## 2013-09-01 MED ORDER — HYDROCHLOROTHIAZIDE 25 MG PO TABS: 25.0000 mg | ORAL_TABLET | Freq: Every day | ORAL | Status: DC

## 2013-09-01 MED ORDER — NORETHIN ACE-ETH ESTRAD-FE 1.5-30 MG-MCG PO TABS: 1.0000 | ORAL_TABLET | Freq: Every day | ORAL | Status: DC

## 2013-09-01 NOTE — ED Notes (Signed)
Pt  Here  For  Refill    Of her meds       She  denys   Any         Physical  Symptoms

## 2013-09-01 NOTE — ED Provider Notes (Signed)
Chief Complaint:   Chief Complaint  Patient presents with  . 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 She presents today for refills on her blood pressure medication. She cannot get in to see her primary care physician since she has an outstanding balance. She has been on Exforge 10/160 once a day and hydrochlorothiazide 25 mg once a day. She also takes a birth control pill, Larin/FE 1.5/30, for cycle control. She has a tubal ligation, so she does not need it for birth control. She denies any side effects with any of these medications and she has had no headaches, dizziness, blurry vision, shortness of breath, chest pain, tightness, pressure, ankle edema, or strokelike symptoms. She denies any history of diabetes, elevated cholesterol, renal disease, or cigarette smoking. She does watch her salt intake and is trying to lose weight. She plans to get in to see her primary care physician in the near future. She has had blood work done within the past year.  Review of Systems:  Other than noted above, the patient denies any of the following symptoms: Systemic:  No fever, chills, fatigue, weight loss or gain. Respiratory:  No coughing, wheezing, or shortness of breath. Cardiac:  No chest pain, tightness, pressure, palpitations, syncope, or edema. Neuro:  No headache, dizziness, blurred vision, weakness, paresthesias, or strokelike symptoms.  PMFSH:  Past medical history, family history, social history, meds, and allergies were reviewed.  Physical Exam:   Vital signs:  BP 129/69  Pulse 80  Temp(Src) 99 F (37.2 C) (Oral)  Resp 18  SpO2 100%  LMP 08/20/2013 General:  Alert, oriented, in no distress. Lungs:  Breath sounds clear and equal bilaterally.  No wheezes, rales, or rhonchi. Heart:  Regular rhythm, no gallops, murmers, clicks or rubs.  Abdomen:  Soft and flat.  Nontender,  no organomegaly or mass.  No pulsatile midline abdominal mass or bruit. Ext:  No edema, pulses full. Neurological exam:  Alert and oriented.  Speech is clear.  No pronator drift.  CNs intact.  Assessment:  The primary encounter diagnosis was Hypertension. A diagnosis of Menometrorrhagia was also pertinent to this visit.  Plan:   1.  Meds:  The following meds were prescribed:   Discharge Medication List as of 09/01/2013  2:45 PM    START taking these medications   Details  !! amLODipine-valsartan (EXFORGE) 10-160 MG per tablet Take 1 tablet by mouth daily., Starting 09/01/2013, Until Discontinued, Normal    !! hydrochlorothiazide (HYDRODIURIL) 25 MG tablet Take 1 tablet (25 mg total) by mouth daily., Starting 09/01/2013, Until Discontinued, Normal    norethindrone-ethinyl estradiol-iron (LARIN FE 1.5/30) 1.5-30 MG-MCG tablet Take 1 tablet by mouth daily., Starting 09/01/2013, Until Discontinued, Normal     !! - Potential duplicate medications found. Please discuss with provider.      2.  Patient Education/Counseling:  The patient was given appropriate handouts, self care instructions, and instructed in symptomatic relief. Specifically discussed salt and sodium restriction, weight control, and exercise.  3.  Follow up:  The patient was told to follow up if no better in 3 to 4 days, if becoming worse in any way, and given some red flag symptoms such as shortness of breath or chest pain which would prompt immediate return.  Follow up with Dr. Merri Brunette as soon as possible.     Reuben Likes, MD  09/01/13 2019 

## 2014-01-27 ENCOUNTER — Ambulatory Visit (INDEPENDENT_AMBULATORY_CARE_PROVIDER_SITE_OTHER): Payer: 59 | Admitting: Internal Medicine

## 2014-01-27 ENCOUNTER — Ambulatory Visit: Payer: 59

## 2014-01-27 VITALS — BP 105/81 | HR 75 | Temp 98.0°F | Resp 18 | Ht 68.25 in | Wt 273.0 lb

## 2014-01-27 DIAGNOSIS — M25529 Pain in unspecified elbow: Secondary | ICD-10-CM

## 2014-01-27 DIAGNOSIS — S5000XA Contusion of unspecified elbow, initial encounter: Secondary | ICD-10-CM

## 2014-01-27 MED ORDER — MELOXICAM 15 MG PO TABS: 15.0000 mg | ORAL_TABLET | Freq: Every day | ORAL | Status: DC

## 2014-01-27 NOTE — Progress Notes (Signed)
   Subjective:    Patient ID: HIYA POINT, female    DOB: 1971/06/10, 43 y.o.   MRN: [id number]  HPIhit elbow (lateral aspect) yesterday--ok after Woke today with pain lateral L elbow worse with touching or moving  There are no active problems to display for this patient.     Review of Systems noncontr    Objective:   Physical Exam BP 105/81  Pulse 75  Temp(Src) 98 F (36.7 C) (Oral)  Resp 18  Ht 5' 8.25" (1.734 m)  Wt 273 lb (123.832 kg)  BMI 41.18 kg/m2  SpO2 98%  LMP 01/13/2014  Tender over Lateral epicondyle with lack of full extension due to pain, but no swelling or ecchymoses and full sup and pron w/out pain//grip full      [facility] reading (PRIMARY) by  Dr. Stasia Cavalier acute fracture. ?defect at medial joint margin.   Assessment & Plan:  Pain, elbow - Plan: DG Elbow Complete Left  Contusion, elbow - Plan: DG Elbow Complete Left  Meds ordered this encounter  Medications  . meloxicam (MOBIC) 15 MG tablet    Sig: Take 1 tablet (15 mg total) by mouth daily.    Dispense:  30 tablet    Refill:  0   ROM Ice F/u 7 days

## 2014-03-22 ENCOUNTER — Ambulatory Visit (INDEPENDENT_AMBULATORY_CARE_PROVIDER_SITE_OTHER): Payer: 59 | Admitting: Internal Medicine

## 2014-03-22 VITALS — BP 122/75 | HR 72 | Temp 98.8°F | Resp 18 | Wt 268.0 lb

## 2014-03-22 DIAGNOSIS — R05 Cough: Secondary | ICD-10-CM

## 2014-03-22 DIAGNOSIS — R059 Cough, unspecified: Secondary | ICD-10-CM

## 2014-03-22 DIAGNOSIS — I10 Essential (primary) hypertension: Secondary | ICD-10-CM | POA: Insufficient documentation

## 2014-03-22 DIAGNOSIS — J329 Chronic sinusitis, unspecified: Secondary | ICD-10-CM

## 2014-03-22 DIAGNOSIS — J301 Allergic rhinitis due to pollen: Secondary | ICD-10-CM

## 2014-03-22 MED ORDER — AMLODIPINE BESYLATE-VALSARTAN 10-160 MG PO TABS: 1.0000 | ORAL_TABLET | Freq: Every day | ORAL | Status: DC

## 2014-03-22 MED ORDER — AMOXICILLIN 875 MG PO TABS: 875.0000 mg | ORAL_TABLET | Freq: Two times a day (BID) | ORAL | Status: DC

## 2014-03-22 MED ORDER — PREDNISONE 20 MG PO TABS: ORAL_TABLET | ORAL | Status: DC

## 2014-03-22 MED ORDER — HYDROCODONE-HOMATROPINE 5-1.5 MG/5ML PO SYRP: 5.0000 mL | ORAL_SOLUTION | Freq: Four times a day (QID) | ORAL | Status: DC | PRN

## 2014-03-22 NOTE — Progress Notes (Signed)
Subjective:    Patient ID: Megan Mitchell, female    DOB: 04-23-1971, 43 y.o.   MRN: [id number]  URI  Associated symptoms include coughing, ear pain, sneezing and a sore throat.   This chart was scribed for Tami Lin, MD by Ladene Artist, ED Scribe. The patient was seen in room 9. Patient's care was started at 8:20 AM.  HPI Comments: Megan Mitchell is a 43 y.o. female who presents to the [facility] complaining of sneezing onset 1 week ago. She reports associated sore throat, productive cough, ear pain, eye itching, eye discharge. She denies fever. Pt has a h/o allergies. She has tried Claritin, Robitussin and Mucinex with no relief.   Past Medical History  Diagnosis Date  . Hypertension   . Anemia    Current Outpatient Prescriptions on File Prior to Visit  Medication Sig Dispense Refill  . amLODipine-valsartan (EXFORGE) 10-160 MG per tablet Take 1 tablet by mouth daily.        . ferrous fumarate (HEMOCYTE - 106 MG FE) 325 (106 FE) MG TABS Take 1 tablet by mouth.        . hydrochlorothiazide (HYDRODIURIL) 25 MG tablet Take 25 mg by mouth daily.        . norethindrone-ethinyl estradiol-iron (LARIN FE 1.5/30) 1.5-30 MG-MCG tablet Take 1 tablet by mouth daily.  1 Package  11  . HYDROcodone-acetaminophen (NORCO/VICODIN) 5-325 MG per tablet Take 2 tablets by mouth every 6 (six) hours as needed for pain.  15 tablet  0   No current facility-administered medications on file prior to visit.   Allergies  Allergen Reactions  . Sulfa Antibiotics   she also wants refill exforge and will sch f/u w/ her MD  Review of Systems  Constitutional: Negative for fever.  HENT: Positive for ear pain, sneezing and sore throat.   Eyes: Positive for discharge and itching.  Respiratory: Positive for cough.   Allergic/Immunologic: Positive for environmental allergies.      Objective:   Physical Exam  Nursing note and vitals reviewed. Constitutional: She is oriented to  person, place, and time. She appears well-developed and well-nourished. No distress.  HENT:  Head: Normocephalic and atraumatic.  Right Ear: Tympanic membrane normal.  Left Ear: Tympanic membrane normal.  Nose: Rhinorrhea present.  Mouth/Throat: Oropharynx is clear and moist and mucous membranes are normal.  Eyes: EOM are normal. Right conjunctiva is injected.  Neck: Neck supple.  Cardiovascular: Normal rate.   Pulmonary/Chest: Effort normal and breath sounds normal. No respiratory distress. She has no wheezes.  Musculoskeletal: Normal range of motion.  Lymphadenopathy:    She has cervical adenopathy (small but tender).  Neurological: She is alert and oriented to person, place, and time.  Skin: Skin is warm and dry.  Psychiatric: She has a normal mood and affect. Her behavior is normal.      Assessment & Plan:  Sinusitis - Plan: amoxicillin (AMOXIL) 875 MG tablet  Allergic rhinitis due to pollen - Plan: predniSONE (DELTASONE) 20 MG tablet  Cough - Plan: HYDROcodone-homatropine (HYCODAN) 5-1.5 MG/5ML syrup  HTN (hypertension)  Severe obesity (BMI >= 40)  Meds ordered this encounter  Medications  . amoxicillin (AMOXIL) 875 MG tablet    Sig: Take 1 tablet (875 mg total) by mouth 2 (two) times daily.    Dispense:  20 tablet    Refill:  0  . predniSONE (DELTASONE) 20 MG tablet    Sig: 3/3/2/2/1/1 single daily dose for 6 days  Dispense:  12 tablet    Refill:  0  . HYDROcodone-homatropine (HYCODAN) 5-1.5 MG/5ML syrup    Sig: Take 5 mLs by mouth every 6 (six) hours as needed for cough.    Dispense:  120 mL    Refill:  0  . amLODipine-valsartan (EXFORGE) 10-160 MG per tablet    Sig: Take 1 tablet by mouth daily.    Dispense:  90 tablet    Refill:  1     I personally performed the services described in this documentation, which was scribed in my presence. The recorded information has been reviewed and is accurate.

## 2014-03-24 ENCOUNTER — Telehealth: Payer: Self-pay | Admitting: Internal Medicine

## 2014-03-24 MED ORDER — FLUCONAZOLE 150 MG PO TABS: 150.0000 mg | ORAL_TABLET | Freq: Once | ORAL | Status: DC

## 2014-03-24 NOTE — Telephone Encounter (Signed)
Done

## 2014-03-24 NOTE — Telephone Encounter (Signed)
LMVM that medication was called into the pharmacy as requested.  Please call back if you have any further questions or concerns.

## 2014-03-24 NOTE — Telephone Encounter (Signed)
Patient was on an antibiotic and now has a yeast infection. States that she wants Diflucan called in to [place]

## 2014-05-30 ENCOUNTER — Other Ambulatory Visit: Payer: Self-pay

## 2014-05-30 MED ORDER — HYDROCHLOROTHIAZIDE 25 MG PO TABS: 25.0000 mg | ORAL_TABLET | Freq: Every day | ORAL | Status: DC

## 2014-05-30 NOTE — Telephone Encounter (Signed)
Dr Laney Pastor, you saw pt 03/22/14 and wrote for another BP med, but you haven't Rxd the HCTZ previously (Dr Philipp Deputy has in the past). Do you want to Rx for pt? I verified w/pt that she wants it to go to [facility] outpt, not [facility].

## 2014-05-30 NOTE — Telephone Encounter (Signed)
PT WOULD LIKE A REFILL ON HCTZ, SHE IS CURRENTLY COMPLETELY OUT. Megan Mitchell

## 2014-08-15 ENCOUNTER — Ambulatory Visit (INDEPENDENT_AMBULATORY_CARE_PROVIDER_SITE_OTHER): Payer: 59 | Admitting: Emergency Medicine

## 2014-08-15 VITALS — BP 108/64 | HR 68 | Temp 99.3°F | Resp 16 | Ht 67.5 in | Wt 265.0 lb

## 2014-08-15 DIAGNOSIS — B373 Candidiasis of vulva and vagina: Secondary | ICD-10-CM

## 2014-08-15 DIAGNOSIS — R1900 Intra-abdominal and pelvic swelling, mass and lump, unspecified site: Secondary | ICD-10-CM

## 2014-08-15 DIAGNOSIS — B3731 Acute candidiasis of vulva and vagina: Secondary | ICD-10-CM

## 2014-08-15 DIAGNOSIS — R350 Frequency of micturition: Secondary | ICD-10-CM

## 2014-08-15 LAB — POCT WET PREP WITH KOH
Clue Cells Wet Prep HPF POC: NEGATIVE
KOH PREP POC: POSITIVE
RBC Wet Prep HPF POC: NEGATIVE
TRICHOMONAS UA: NEGATIVE
YEAST WET PREP PER HPF POC: POSITIVE

## 2014-08-15 LAB — POCT URINALYSIS DIPSTICK
BILIRUBIN UA: NEGATIVE
GLUCOSE UA: NEGATIVE
KETONES UA: NEGATIVE
LEUKOCYTES UA: NEGATIVE
NITRITE UA: NEGATIVE
Protein, UA: NEGATIVE
Spec Grav, UA: >=1.03
Urobilinogen, UA: 2
pH, UA: 7

## 2014-08-15 LAB — POCT UA - MICROSCOPIC ONLY
Bacteria, U Microscopic: NEGATIVE
Casts, Ur, LPF, POC: NEGATIVE
Crystals, Ur, HPF, POC: NEGATIVE
Mucus, UA: NEGATIVE
WBC, UR, HPF, POC: NEGATIVE
YEAST UA: NEGATIVE

## 2014-08-15 LAB — POCT URINE PREGNANCY: PREG TEST UR: NEGATIVE

## 2014-08-15 MED ORDER — NORETHIN ACE-ETH ESTRAD-FE 1.5-30 MG-MCG PO TABS: 1.0000 | ORAL_TABLET | Freq: Every day | ORAL | Status: DC

## 2014-08-15 MED ORDER — FLUCONAZOLE 150 MG PO TABS: 150.0000 mg | ORAL_TABLET | Freq: Once | ORAL | Status: DC

## 2014-08-15 NOTE — Progress Notes (Addendum)
Subjective:    Patient ID: Megan Mitchell, female    DOB: October 30, 1971, 43 y.o.   MRN: [id number] This chart was scribed for Arlyss Queen, MD by Marti Sleigh, Medical Scribe. This patient was seen in Room 12 and the patient's care was started at 4:49 PM.  HPI HPI Comments: Megan SANTOYO is a 43 y.o. female who presents to [facility] complaining of dysuria that started two days ago. Pt got up one time last night to urinate. Pt is on birth control, and states her period is normal. Pt states she will see her PCP in the second week of December. Pt states her pcp told her she would order an ultrasound at her next visit.   Review of Systems  Genitourinary: Positive for dysuria.       Objective:   Physical Exam  Nursing note and vitals reviewed. Constitutional: She is oriented to person, place, and time. She appears well-developed and well-nourished.  HENT:  Head: Normocephalic and atraumatic.  Eyes: Pupils are equal, round, and reactive to light.  Neck: No JVD present.  Cardiovascular: Normal rate and regular rhythm.   Pulmonary/Chest: Effort normal and breath sounds normal. No respiratory distress.  Abdominal: Bowel sounds are normal. There is no tenderness.  Palpable irregular uterus.   Neurological: She is alert and oriented to person, place, and time.  Skin: Skin is warm and dry.  Psychiatric: She has a normal mood and affect. Her behavior is normal.   Results for orders placed in visit on 08/15/14  POCT UA - MICROSCOPIC ONLY      Result Value Ref Range   WBC, Ur, HPF, POC neg     RBC, urine, microscopic 0-1     Bacteria, U Microscopic neg     Mucus, UA neg     Epithelial cells, urine per micros 1-3     Crystals, Ur, HPF, POC neg     Casts, Ur, LPF, POC neg     Yeast, UA neg    POCT URINALYSIS DIPSTICK      Result Value Ref Range   Color, UA yellow     Clarity, UA clear     Glucose, UA neg     Bilirubin, UA neg     Ketones, UA neg     Spec Grav, UA >=1.030     Blood, UA  trace-lysed     pH, UA 7.0     Protein, UA neg     Urobilinogen, UA 2.0     Nitrite, UA neg     Leukocytes, UA Negative    POCT URINE PREGNANCY      Result Value Ref Range   Preg Test, Ur Negative     Results for orders placed in visit on 08/15/14  POCT UA - MICROSCOPIC ONLY      Result Value Ref Range   WBC, Ur, HPF, POC neg     RBC, urine, microscopic 0-1     Bacteria, U Microscopic neg     Mucus, UA neg     Epithelial cells, urine per micros 1-3     Crystals, Ur, HPF, POC neg     Casts, Ur, LPF, POC neg     Yeast, UA neg    POCT URINALYSIS DIPSTICK      Result Value Ref Range   Color, UA yellow     Clarity, UA clear     Glucose, UA neg     Bilirubin, UA neg     Ketones,  UA neg     Spec Grav, UA >=1.030     Blood, UA trace-lysed     pH, UA 7.0     Protein, UA neg     Urobilinogen, UA 2.0     Nitrite, UA neg     Leukocytes, UA Negative    POCT URINE PREGNANCY      Result Value Ref Range   Preg Test, Ur Negative    POCT WET PREP WITH KOH      Result Value Ref Range   Trichomonas, UA Negative     Clue Cells Wet Prep HPF POC neg     Epithelial Wet Prep HPF POC 3-5     Yeast Wet Prep HPF POC pos     Bacteria Wet Prep HPF POC trace     RBC Wet Prep HPF POC neg     WBC Wet Prep HPF POC 0-1     KOH Prep POC Positive         Assessment & Plan:  Patient had positive monilia on her self wet prep. She does have what appears to be fibroids on exam. She is due to see her regular doctor in December for evaluation. She has been advised to have an ultrasound. We'll treat with Diflucan. No pelvic was done today. If she is symptomatic at the end of the week and treatment for yeast we'll do her exam at that time. Pregnancy test was negative . She has refills on her blood pressure medication. She was given 3 months of her birth control pills I personally performed the services described in this documentation, which was scribed in my presence. The recorded information has been  reviewed and is accurate. Patient declined physical exam her pelvic exam while she was in the office she prefers to wait

## 2014-08-15 NOTE — Patient Instructions (Signed)

## 2014-08-16 LAB — URINE CULTURE
COLONY COUNT: NO GROWTH
ORGANISM ID, BACTERIA: NO GROWTH

## 2014-08-18 ENCOUNTER — Other Ambulatory Visit: Payer: Self-pay | Admitting: Emergency Medicine

## 2014-08-18 DIAGNOSIS — R1900 Intra-abdominal and pelvic swelling, mass and lump, unspecified site: Secondary | ICD-10-CM

## 2014-08-21 ENCOUNTER — Other Ambulatory Visit: Payer: Self-pay | Admitting: Emergency Medicine

## 2014-08-21 ENCOUNTER — Telehealth: Payer: Self-pay | Admitting: *Deleted

## 2014-08-21 MED ORDER — ONDANSETRON 8 MG PO TBDP: 8.0000 mg | ORAL_TABLET | Freq: Three times a day (TID) | ORAL | Status: DC | PRN

## 2014-08-21 NOTE — Telephone Encounter (Signed)
Call and let her know I sent the prescription in. If she is not feeling well she needs to RTC for recheck

## 2014-08-21 NOTE — Telephone Encounter (Signed)
Pt called and is asking if she may get a prescription for Zofran. She states she has been nauseous and has diarrhea since her visit on Friday.   [place]

## 2014-08-22 NOTE — Telephone Encounter (Signed)
Pt.notified

## 2014-10-03 ENCOUNTER — Other Ambulatory Visit: Payer: Self-pay | Admitting: Internal Medicine

## 2014-10-23 ENCOUNTER — Other Ambulatory Visit: Payer: Self-pay | Admitting: Emergency Medicine

## 2014-11-27 ENCOUNTER — Other Ambulatory Visit: Payer: Self-pay | Admitting: Family Medicine

## 2014-11-27 ENCOUNTER — Other Ambulatory Visit: Payer: Self-pay | Admitting: Physician Assistant

## 2014-12-02 ENCOUNTER — Telehealth: Payer: Self-pay

## 2014-12-02 NOTE — Telephone Encounter (Signed)
Pt needs a refill on her birth control and blood pressure medicine.  She may have said another one, but I cant remember now.  I have been trying to put this message in for almost a hour.   [phone]

## 2014-12-04 ENCOUNTER — Other Ambulatory Visit: Payer: Self-pay | Admitting: Family Medicine

## 2014-12-04 ENCOUNTER — Telehealth: Payer: Self-pay

## 2014-12-04 NOTE — Telephone Encounter (Signed)
I spoke with pt to verify which Rx's she needed. LARIN FE 1.5/30 1.5-30 MG-MCG tablet [[id number]] , hydrochlorothiazide (HYDRODIURIL) 25 MG tablet , amLODipine-valsartan (EXFORGE) 10-160 MG per tablet   Her pharmacist advised her that it would be cheaper to separate the Amlodipine and Valsartan and would like to change it. Can we do this for pt? Please advise.

## 2014-12-26 ENCOUNTER — Ambulatory Visit (INDEPENDENT_AMBULATORY_CARE_PROVIDER_SITE_OTHER): Payer: 59 | Admitting: Physician Assistant

## 2014-12-26 VITALS — BP 126/70 | HR 61 | Temp 97.8°F | Resp 19 | Ht 68.0 in | Wt 271.8 lb

## 2014-12-26 DIAGNOSIS — N920 Excessive and frequent menstruation with regular cycle: Secondary | ICD-10-CM

## 2014-12-26 DIAGNOSIS — I10 Essential (primary) hypertension: Secondary | ICD-10-CM

## 2014-12-26 DIAGNOSIS — D649 Anemia, unspecified: Secondary | ICD-10-CM | POA: Insufficient documentation

## 2014-12-26 MED ORDER — VALSARTAN 160 MG PO TABS
160.0000 mg | ORAL_TABLET | Freq: Every day | ORAL | Status: DC
Start: 2014-12-26 — End: 2015-03-16

## 2014-12-26 MED ORDER — HYDROCHLOROTHIAZIDE 25 MG PO TABS: 25.0000 mg | ORAL_TABLET | Freq: Every day | ORAL | Status: DC

## 2014-12-26 MED ORDER — AMLODIPINE BESYLATE 10 MG PO TABS: 10.0000 mg | ORAL_TABLET | Freq: Every day | ORAL | Status: DC

## 2014-12-26 MED ORDER — NORETHIN ACE-ETH ESTRAD-FE 1.5-30 MG-MCG PO TABS: 1.0000 | ORAL_TABLET | Freq: Every day | ORAL | Status: DC

## 2014-12-26 NOTE — Patient Instructions (Signed)
Your prescriptions have been sent to your pharmacy. Follow up with your primary care provider in April. Return with any problems/concerns.

## 2014-12-26 NOTE — Progress Notes (Signed)
Subjective:    Patient ID: Megan Mitchell, female    DOB: 1971/07/14, 44 y.o.   MRN: [id number]  HPI  This is a 44 year old female with PMH anemia and HTN who is presenting for BP recheck and med refills.  HTN: been on has been on BP meds for 5-6 years. She has a PCP, Carol Ada, who she is seeing for CPE in 2 months. She reports she was required to have OV for refills however a new office policy is that all balances have to be paid for new OVs. Pt has not yet paid balance from last visit and is unable to make full payment at this time. She takes her BP meds every day and states they have worked well for her. She is wanting combo valsartan-amlodipine pill to be prescribed separately for cost purposes. She denies CP, SOB, palpitations, LE edema.  Heavy menses: takes OCP to keep period regular. Has had a tubal ligation. Does not smoke.   Review of Systems  Constitutional: Negative for fever and chills.  Respiratory: Negative for shortness of breath.   Cardiovascular: Negative for chest pain, palpitations and leg swelling.  Gastrointestinal: Negative for nausea, vomiting and abdominal pain.  Skin: Negative for rash.  Neurological: Negative for dizziness and headaches.  Hematological: Negative for adenopathy.   Patient Active Problem List   Diagnosis Date Noted  . Anemia 12/26/2014  . HTN (hypertension) 03/22/2014  . Severe obesity (BMI >= 40) 03/22/2014    Prior to Admission medications   Medication Sig Start Date End Date Taking? Authorizing Provider  ferrous fumarate (HEMOCYTE - 106 MG FE) 325 (106 FE) MG TABS Take 1 tablet by mouth.     Yes Historical Provider, MD  hydrochlorothiazide (HYDRODIURIL) 25 MG tablet TAKE 1 TABLET BY MOUTH ONCE DAILY 10/05/14  Yes Mancel Bale, PA-C  LARIN FE 1.5/30 1.5-30 MG-MCG tablet TAKE 1 TABLET BY MOUTH ONCE DAILY 10/23/14  Yes Mancel Bale, PA-C  amLODipine-valsartan (EXFORGE) 10-160 MG per tablet Take 1 tablet by mouth daily. NO MORE REFILLS  WITHOUT OFFICE VISIT - 2ND NOTICE Patient not taking: Reported on 12/26/2014 11/27/14  Yes Wardell Honour, MD   Allergies  Allergen Reactions  . Sulfa Antibiotics    Patient's social and family history were reviewed.     Objective:   Physical Exam  Constitutional: She is oriented to person, place, and time. She appears well-developed and well-nourished. No distress.  HENT:  Head: Normocephalic and atraumatic.  Right Ear: Hearing normal.  Left Ear: Hearing normal.  Nose: Nose normal.  Eyes: Conjunctivae and lids are normal. Right eye exhibits no discharge. Left eye exhibits no discharge. No scleral icterus.  Cardiovascular: Normal rate, regular rhythm, normal heart sounds, intact distal pulses and normal pulses.   No murmur heard. Pulmonary/Chest: Effort normal and breath sounds normal. No respiratory distress. She has no wheezes. She has no rhonchi. She has no rales.  Musculoskeletal: Normal range of motion.  Neurological: She is alert and oriented to person, place, and time.  Skin: Skin is warm, dry and intact. No lesion and no rash noted.  Psychiatric: She has a normal mood and affect. Her speech is normal and behavior is normal. Thought content normal.   BP 126/70 mmHg  Pulse 61  Temp(Src) 97.8 F (36.6 C) (Oral)  Resp 19  Ht 5\' 8"  (1.727 m)  Wt 271 lb 12.8 oz (123.288 kg)  BMI 41.34 kg/m2  SpO2 95%  LMP 12/08/2014  Assessment & Plan:  1. Essential hypertension Refilled pt's meds for two months until she is able to get in to see her PCP 01/2015. Blood pressure stable.  - hydrochlorothiazide (HYDRODIURIL) 25 MG tablet; Take 1 tablet (25 mg total) by mouth daily.  Dispense: 30 tablet; Refill: 2 - amLODipine (NORVASC) 10 MG tablet; Take 1 tablet (10 mg total) by mouth daily.  Dispense: 30 tablet; Refill: 2 - valsartan (DIOVAN) 160 MG tablet; Take 1 tablet (160 mg total) by mouth daily.  Dispense: 30 tablet; Refill: 2  2. Menorrhagia with regular cycle Refilled one  month of OCP until she is able to get in to see her PCP 12/2014.  - norethindrone-ethinyl estradiol-iron (LARIN FE 1.5/30) 1.5-30 MG-MCG tablet; Take 1 tablet by mouth daily.  Dispense: 28 tablet; Refill: 1   Chelli Yerkes V. Drenda Freeze, MHS [facility]  12/26/2014

## 2014-12-26 NOTE — Progress Notes (Signed)
  Medical screening examination/treatment/procedure(s) were performed by non-physician practitioner and as supervising physician I was immediately available for consultation/collaboration.     

## 2015-03-16 ENCOUNTER — Other Ambulatory Visit: Payer: Self-pay | Admitting: Physician Assistant

## 2015-04-22 ENCOUNTER — Ambulatory Visit (INDEPENDENT_AMBULATORY_CARE_PROVIDER_SITE_OTHER): Payer: 59 | Admitting: Physician Assistant

## 2015-04-22 VITALS — BP 128/76 | HR 74 | Temp 98.9°F | Resp 17 | Ht 68.0 in | Wt 274.2 lb

## 2015-04-22 DIAGNOSIS — N920 Excessive and frequent menstruation with regular cycle: Secondary | ICD-10-CM | POA: Diagnosis not present

## 2015-04-22 DIAGNOSIS — I10 Essential (primary) hypertension: Secondary | ICD-10-CM

## 2015-04-22 MED ORDER — NORETHIN ACE-ETH ESTRAD-FE 1.5-30 MG-MCG PO TABS: 1.0000 | ORAL_TABLET | Freq: Every day | ORAL | Status: DC

## 2015-04-22 MED ORDER — HYDROCHLOROTHIAZIDE 25 MG PO TABS: 25.0000 mg | ORAL_TABLET | Freq: Every day | ORAL | Status: DC

## 2015-04-22 MED ORDER — AMLODIPINE BESYLATE 10 MG PO TABS: 10.0000 mg | ORAL_TABLET | Freq: Every day | ORAL | Status: DC

## 2015-04-22 MED ORDER — VALSARTAN 160 MG PO TABS: 160.0000 mg | ORAL_TABLET | Freq: Every day | ORAL | Status: DC

## 2015-04-22 NOTE — Progress Notes (Signed)
[facility] [address], [place] [address] [phone]  Date:  04/22/2015   Name:  Megan Mitchell   DOB:  12/25/1970   MRN:  [id number]  PCP:  Reginia Naas, MD    Chief Complaint: Medication Refill   History of Present Illness:  This is a 44 y.o. female with PMH HTN, obesity and anemia who is presenting for medication refills.   I met patient 4 months ago. At that time she was coming in for refills of her HTN medications and OCP until she was able to get in with her PCP. Pt had outstanding bill at her PCPs office and was not allowed back until it was paid. Pt is back here today because she still has not paid the bill and states she is not planning to go back. She is doing well on her BP meds. She takes amlodopine, losartan and HCTZ daily and stable. She denies CP, SOB, palpitations, headache, dizziness, palpitations.  Pt has gained 3 pounds since last visit and 9 pounds in the past 8 months. She has an appointment with a nutritionist through [place] on July 7th. She is committed to losing weight but wants help with what she should and shouldn't eat. She will come back for CPE in 6 months and has a weight loss goal of 25 pounds by that time. She does not have a history of DM. Wt Readings from Last 3 Encounters:  04/22/15 274 lb 3.2 oz (124.376 kg)  12/26/14 271 lb 12.8 oz (123.288 kg)  08/15/14 265 lb (120.203 kg)   Pt is doing well on her OCP. She had a tubal ligation several years ago. She takes OCPs to keep her periods regular. When off OCPs she has very heavy and painful periods. She is starting to consider coming off OCPs as she does not want to continue taking estrogen at her age. She is interested in possible progesterone only medications/devices. She has an upcoming appt with GYN and plans to discuss with them. Her mother went through menopause in her early 51s. She is not a smoker and denies a personal or family history of blood clots.  Upcoming  trips: [place] girls trip in September and [place] cruise with family in November.  Review of Systems:  Review of Systems See HPI  Patient Active Problem List   Diagnosis Date Noted  . Anemia 12/26/2014  . HTN (hypertension) 03/22/2014  . Severe obesity (BMI >= 40) 03/22/2014    Prior to Admission medications   Medication Sig Start Date End Date Taking? Authorizing Provider  amLODipine (NORVASC) 10 MG tablet TAKE 1 TABLET BY MOUTH ONCE DAILY 03/16/15  Yes Bennett Scrape V, PA-C         ferrous fumarate (HEMOCYTE - 106 MG FE) 325 (106 FE) MG TABS Take 1 tablet by mouth.     Yes Historical Provider, MD  hydrochlorothiazide (HYDRODIURIL) 25 MG tablet TAKE 1 TABLET BY MOUTH DAILY. 03/16/15  Yes Nicole Bush V, PA-C  LARIN FE 1.5/30 1.5-30 MG-MCG tablet TAKE 1 TABLET BY MOUTH DAILY. 03/16/15  Yes Bennett Scrape V, PA-C  valsartan (DIOVAN) 160 MG tablet TAKE 1 TABLET BY MOUTH ONCE DAILY 03/16/15  Yes Bennett Scrape V, PA-C    Allergies  Allergen Reactions  . Sulfa Antibiotics     Past Surgical History  Procedure Laterality Date  . Cholecystectomy    . Elbow surgery    . Tubal ligation      History  Substance Use Topics  . Smoking status: Never Smoker   . Smokeless tobacco: Not on file  . Alcohol Use: No    Family History  Problem Relation Age of Onset  . Hypertension Mother     Medication list has been reviewed and updated.  Physical Examination:  Physical Exam  Constitutional: She is oriented to person, place, and time. She appears well-developed and well-nourished. No distress.  HENT:  Head: Normocephalic and atraumatic.  Right Ear: Hearing normal.  Left Ear: Hearing normal.  Nose: Nose normal.  Eyes: Conjunctivae and lids are normal. Right eye exhibits no discharge. Left eye exhibits no discharge. No scleral icterus.  Cardiovascular: Normal rate, regular rhythm, normal heart sounds and normal pulses.   No murmur heard. Pulmonary/Chest: Effort normal and breath sounds  normal. No respiratory distress. She has no wheezes. She has no rhonchi. She has no rales.  Musculoskeletal: Normal range of motion.  Neurological: She is alert and oriented to person, place, and time.  Skin: Skin is warm, dry and intact. No lesion and no rash noted.  No LE edema  Psychiatric: She has a normal mood and affect. Her speech is normal and behavior is normal. Thought content normal.   BP 128/76 mmHg  Pulse 74  Temp(Src) 98.9 F (37.2 C) (Oral)  Resp 17  Ht 5' 8" (1.727 m)  Wt 274 lb 3.2 oz (124.376 kg)  BMI 41.70 kg/m2  SpO2 99%  Assessment and Plan:  1. Essential hypertension BP stable, meds refilled - amLODipine (NORVASC) 10 MG tablet; Take 1 tablet (10 mg total) by mouth daily.  Dispense: 30 tablet; Refill: 5 - hydrochlorothiazide (HYDRODIURIL) 25 MG tablet; Take 1 tablet (25 mg total) by mouth daily.  Dispense: 30 tablet; Refill: 5 - valsartan (DIOVAN) 160 MG tablet; Take 1 tablet (160 mg total) by mouth daily.  Dispense: 30 tablet; Refill: 5 - Comprehensive metabolic panel - CBC  2. Menorrhagia with regular cycle OCP refilled. TSH pending. We discussed other contraceptive options that may be better than OCPs. She has an upcoming appt with GYN to discuss. - norethindrone-ethinyl estradiol-iron (LARIN FE 1.5/30) 1.5-30 MG-MCG tablet; Take 1 tablet by mouth daily.  Dispense: 28 tablet; Refill: 11 - TSH  3. Severe obesity (BMI >= 40) Upcoming appt with nutritionist. She will return in 6 months for CPE and f/u. 25 pound weight loss goal by that time.   Benjaman Pott Drenda Freeze, MHS [facility]  04/25/2015

## 2015-04-22 NOTE — Patient Instructions (Signed)
Return in 6 months for complete physical. Come fasting so we can check your cholesterol. Mirena IUD Work on weight loss - goal 25 pound weight loss by beginning - mid December. 249.2 lb goal weight loss by then. Return with further problems/concerns.    Levonorgestrel intrauterine device (IUD) What is this medicine? LEVONORGESTREL IUD (LEE voe nor jes trel) is a contraceptive (birth control) device. The device is placed inside the uterus by a healthcare professional. It is used to prevent pregnancy and can also be used to treat heavy bleeding that occurs during your period. Depending on the device, it can be used for 3 to 5 years. This medicine may be used for other purposes; ask your health care provider or pharmacist if you have questions. COMMON BRAND NAME(S): Jackson Latino, Isla Pence

## 2015-04-23 LAB — CBC
HCT: 37.8 % (ref 36.0–46.0)
HEMOGLOBIN: 12.6 g/dL (ref 12.0–15.0)
MCH: 26.4 pg (ref 26.0–34.0)
MCHC: 33.3 g/dL (ref 30.0–36.0)
MCV: 79.1 fL (ref 78.0–100.0)
MPV: 9.7 fL (ref 8.6–12.4)
Platelets: 539 10*3/uL — ABNORMAL HIGH (ref 150–400)
RBC: 4.78 MIL/uL (ref 3.87–5.11)
RDW: 14.1 % (ref 11.5–15.5)
WBC: 9.5 10*3/uL (ref 4.0–10.5)

## 2015-04-23 LAB — COMPREHENSIVE METABOLIC PANEL
ALBUMIN: 4 g/dL (ref 3.5–5.2)
ALT: 14 U/L (ref 0–35)
AST: 13 U/L (ref 0–37)
Alkaline Phosphatase: 50 U/L (ref 39–117)
BUN: 11 mg/dL (ref 6–23)
CHLORIDE: 99 meq/L (ref 96–112)
CO2: 25 mEq/L (ref 19–32)
CREATININE: 0.74 mg/dL (ref 0.50–1.10)
Calcium: 10.1 mg/dL (ref 8.4–10.5)
Glucose, Bld: 72 mg/dL (ref 70–99)
Potassium: 3.8 mEq/L (ref 3.5–5.3)
Sodium: 139 mEq/L (ref 135–145)
Total Bilirubin: 0.5 mg/dL (ref 0.2–1.2)
Total Protein: 7.9 g/dL (ref 6.0–8.3)

## 2015-04-23 LAB — TSH: TSH: 2.865 u[IU]/mL (ref 0.350–4.500)

## 2015-04-24 ENCOUNTER — Telehealth: Payer: Self-pay

## 2015-04-24 NOTE — Telephone Encounter (Signed)
Pt calling about labs. Please review. Thanks  

## 2015-04-25 ENCOUNTER — Encounter: Payer: Self-pay | Admitting: Physician Assistant

## 2015-04-30 NOTE — Telephone Encounter (Signed)
Patient returned missed phone call. Please call her at work. [phone]

## 2015-04-30 NOTE — Telephone Encounter (Signed)
Called to discuss lab results. Left VM to call back.

## 2015-05-06 ENCOUNTER — Ambulatory Visit (INDEPENDENT_AMBULATORY_CARE_PROVIDER_SITE_OTHER): Payer: 59 | Admitting: Family Medicine

## 2015-05-06 ENCOUNTER — Encounter: Payer: Self-pay | Admitting: Physician Assistant

## 2015-05-06 ENCOUNTER — Telehealth: Payer: Self-pay

## 2015-05-06 VITALS — BP 118/70 | HR 85 | Temp 98.3°F | Resp 18 | Ht 68.25 in | Wt 274.8 lb

## 2015-05-06 DIAGNOSIS — R300 Dysuria: Secondary | ICD-10-CM

## 2015-05-06 DIAGNOSIS — N3000 Acute cystitis without hematuria: Secondary | ICD-10-CM

## 2015-05-06 LAB — POCT UA - MICROSCOPIC ONLY
Casts, Ur, LPF, POC: NEGATIVE
Crystals, Ur, HPF, POC: NEGATIVE
YEAST UA: NEGATIVE

## 2015-05-06 LAB — POCT URINALYSIS DIPSTICK
BILIRUBIN UA: NEGATIVE
Glucose, UA: NEGATIVE
KETONES UA: NEGATIVE
Nitrite, UA: POSITIVE
Protein, UA: 30
SPEC GRAV UA: 1.02
Urobilinogen, UA: 1
pH, UA: 6.5

## 2015-05-06 MED ORDER — CIPROFLOXACIN HCL 250 MG PO TABS: 500.0000 mg | ORAL_TABLET | Freq: Once | ORAL | Status: DC

## 2015-05-06 MED ORDER — CIPROFLOXACIN HCL 250 MG PO TABS: 250.0000 mg | ORAL_TABLET | Freq: Two times a day (BID) | ORAL | Status: DC

## 2015-05-06 NOTE — Telephone Encounter (Signed)
Pt think she may have a uti and since she work at the [employer], wanted to know if she was to do a culture there and send the results to Bennett Scrape, would she call her something in if it is positive Please call [phone]. Spoke with Elmyra Ricks and she said yes

## 2015-05-06 NOTE — Progress Notes (Signed)
[facility] [address] [place] [address] [phone]  Date:  05/06/2015   Name:  Megan Mitchell   DOB:  06-Jan-1971   MRN:  [id number]  PCP:  Reginia Naas, MD    Chief Complaint: Urinary Tract Infection   History of Present Illness:  Megan Mitchell is a 44 y.o. very pleasant female patient who presents with the following:  Here today with possible UTI- she notes sx of dysuria and urgency.  No hematuria She just had her menses- ended a few days ago No fever, belly pain, back pain, vomiting.   No vaginal sx.  She notes onset of sx this am when she was preparing for work She does tend to hold her urine- she is often busy at work and has to hold her bladder for long periods.    Patient Active Problem List   Diagnosis Date Noted  . Anemia 12/26/2014  . HTN (hypertension) 03/22/2014  . Severe obesity (BMI >= 40) 03/22/2014    Past Medical History  Diagnosis Date  . Hypertension   . Anemia     Past Surgical History  Procedure Laterality Date  . Cholecystectomy    . Elbow surgery    . Tubal ligation      History  Substance Use Topics  . Smoking status: Never Smoker   . Smokeless tobacco: Not on file  . Alcohol Use: No    Family History  Problem Relation Age of Onset  . Hypertension Mother     Allergies  Allergen Reactions  . Sulfa Antibiotics     Medication list has been reviewed and updated.  Current Outpatient Prescriptions on File Prior to Visit  Medication Sig Dispense Refill  . amLODipine (NORVASC) 10 MG tablet Take 1 tablet (10 mg total) by mouth daily. 30 tablet 5  . ferrous fumarate (HEMOCYTE - 106 MG FE) 325 (106 FE) MG TABS Take 1 tablet by mouth.      . hydrochlorothiazide (HYDRODIURIL) 25 MG tablet Take 1 tablet (25 mg total) by mouth daily. 30 tablet 5  . norethindrone-ethinyl estradiol-iron (LARIN FE 1.5/30) 1.5-30 MG-MCG tablet Take 1 tablet by mouth daily. 28 tablet 11  . valsartan (DIOVAN) 160 MG tablet  Take 1 tablet (160 mg total) by mouth daily. 30 tablet 5   No current facility-administered medications on file prior to visit.    Review of Systems:  As per HPI- otherwise negative.   Physical Examination: Filed Vitals:   05/06/15 1901  BP: 118/70  Pulse: 85  Temp: 98.3 F (36.8 C)  Resp: 18   Filed Vitals:   05/06/15 1901  Height: 5' 8.25" (1.734 m)  Weight: 274 lb 12.8 oz (124.648 kg)   Body mass index is 41.46 kg/(m^2). Ideal Body Weight: Weight in (lb) to have BMI = 25: 165.3  GEN: WDWN, NAD, Non-toxic, A & O x 3, obese, looks well HEENT: Atraumatic, Normocephalic. Neck supple. No masses, No LAD. Ears and Nose: No external deformity. CV: RRR, No M/G/R. No JVD. No thrill. No extra heart sounds. PULM: CTA B, no wheezes, crackles, rhonchi. No retractions. No resp. distress. No accessory muscle use. ABD: S, NT, ND, +BS. No rebound. No HSM. EXTR: No c/c/e NEURO Normal gait.  PSYCH: Normally interactive. Conversant. Not depressed or anxious appearing.  Calm demeanor.   She does not wish to use an alternative pharmacy to [facility] which is closed now- will give her one 500 mg cipro to take now from  our stock bottle  Assessment and Plan: Acute cystitis without hematuria - Plan: ciprofloxacin (CIPRO) tablet 500 mg  Burning with urination - Plan: POCT UA - Microscopic Only, POCT urinalysis dipstick, ciprofloxacin (CIPRO) 250 MG tablet, Urine culture  Start on cipro for UTI.   Await culture She will let me know if not better, and I will contact if any concerns when culture comes in She does not use ocp for contraception- s/p BTL  Signed Lamar Blinks, MD

## 2015-05-06 NOTE — Patient Instructions (Signed)
We are going to treat you for a UTI with cipro- take the pill I gave you tonight and then start on the rx tomorrow I will be in touch if your urine culture shows any problems Let me know if you do not feel better in 1-2 days

## 2015-05-06 NOTE — Telephone Encounter (Signed)
Notified of results by phone. She was aware she had elevated platelets. she was told about 3 years ago. No further work up. When she returns for follow up we will do ESR, ferritin, and peripheral blood smear. She has an appt with GYN in 2 weeks. Advised she let her GYN know she has elevated plts to determine if she should still be on OCPs with estrogen.

## 2015-05-07 ENCOUNTER — Telehealth: Payer: Self-pay | Admitting: Family Medicine

## 2015-05-07 MED ORDER — FLUCONAZOLE 150 MG PO TABS: 150.0000 mg | ORAL_TABLET | Freq: Once | ORAL | Status: DC

## 2015-05-07 NOTE — Telephone Encounter (Signed)
Diflucan sent

## 2015-05-07 NOTE — Telephone Encounter (Signed)
Patient needs Diflucan to go with her antibiotic. [place]   [phone]

## 2015-05-08 NOTE — Telephone Encounter (Signed)
Left message Rx sent in. 

## 2015-05-09 ENCOUNTER — Encounter: Payer: 59 | Attending: Family Medicine | Admitting: Dietician

## 2015-05-09 ENCOUNTER — Encounter: Payer: Self-pay | Admitting: Family Medicine

## 2015-05-09 DIAGNOSIS — Z713 Dietary counseling and surveillance: Secondary | ICD-10-CM | POA: Insufficient documentation

## 2015-05-09 LAB — URINE CULTURE

## 2015-05-09 NOTE — Progress Notes (Signed)
Patient was seen on 05/09/15 for the Weight Loss Class at the [facility]. The following learning objectives were met by the patient during this class:   Describe healthy choices in each food group  Describe portion size of foods  Use plate method for meal planning  Demonstrate how to read Nutrition Facts food label  Set realistic goals for weight loss, diet changes, and physical activity.   Goals:  1. Make healthy food choices in each food group.  2. Reduce portion size of foods.  3. Increase fruit and vegetable intake.  4. Use plate method for meal planning.  5. Increase physical activity.    Handouts given:   1. Nutrition Strategies for Weight Loss   2. Meal plan/portion card   3. MyPlate Planner   4. Weight Management Recipe Resources   5. Bake, Broil, [place]

## 2015-07-31 ENCOUNTER — Ambulatory Visit (INDEPENDENT_AMBULATORY_CARE_PROVIDER_SITE_OTHER): Payer: 59 | Admitting: Family Medicine

## 2015-07-31 VITALS — BP 138/76 | HR 93 | Temp 98.8°F | Resp 18 | Ht 68.5 in | Wt 275.0 lb

## 2015-07-31 DIAGNOSIS — T3695XA Adverse effect of unspecified systemic antibiotic, initial encounter: Secondary | ICD-10-CM

## 2015-07-31 DIAGNOSIS — R350 Frequency of micturition: Secondary | ICD-10-CM | POA: Diagnosis not present

## 2015-07-31 DIAGNOSIS — I10 Essential (primary) hypertension: Secondary | ICD-10-CM

## 2015-07-31 LAB — POC MICROSCOPIC URINALYSIS (UMFC): Mucus: ABSENT

## 2015-07-31 LAB — POCT URINALYSIS DIP (MANUAL ENTRY)
BILIRUBIN UA: NEGATIVE
BILIRUBIN UA: NEGATIVE
Glucose, UA: NEGATIVE
Nitrite, UA: NEGATIVE
Protein Ur, POC: NEGATIVE
Spec Grav, UA: 1.02
Urobilinogen, UA: 0.2
pH, UA: 6.5

## 2015-07-31 MED ORDER — AMLODIPINE BESYLATE 10 MG PO TABS: 10.0000 mg | ORAL_TABLET | Freq: Every day | ORAL | Status: DC

## 2015-07-31 MED ORDER — FLUCONAZOLE 150 MG PO TABS: 150.0000 mg | ORAL_TABLET | Freq: Once | ORAL | Status: DC

## 2015-07-31 MED ORDER — VALSARTAN 160 MG PO TABS: 160.0000 mg | ORAL_TABLET | Freq: Every day | ORAL | Status: DC

## 2015-07-31 MED ORDER — CEPHALEXIN 500 MG PO CAPS: 500.0000 mg | ORAL_CAPSULE | Freq: Two times a day (BID) | ORAL | Status: DC

## 2015-07-31 MED ORDER — HYDROCHLOROTHIAZIDE 25 MG PO TABS: 25.0000 mg | ORAL_TABLET | Freq: Every day | ORAL | Status: DC

## 2015-07-31 NOTE — Progress Notes (Signed)
[facility] [address], [place] [address] [phone]  Date:  07/31/2015   Name:  Megan Mitchell   DOB:  1971-09-15   MRN:  [id number]  PCP:  Reginia Naas, MD    Chief Complaint: Urinary Frequency   History of Present Illness:  Megan Mitchell is a 44 y.o. very pleasant female patient who presents with the following:  Seen by myself 2 months ago with complaint of cystitis, treated with cirpo, urine culture showed staph aureus sensitive to cipro.  Here today with complaint of urinary frequency yesterday am.  She has started on cranberry juice and hydration.  No dysuria, no hematuria.  No belly pain, fever, vomiting or back pain  She tends to drink a lot of water in the evenings, and then overnight does not want to get up and pee, tends to hold her bladder at work as well.  She wonders if this could have triggered a UTI again  She has recently entered into a romantic relationship with a female partner- in fact, they are recently engaged.  They became sexually intimate a few months ago and she has not been urinating after sex. She wonders if this could be why she keeps getting UTI  Patient Active Problem List   Diagnosis Date Noted  . Anemia 12/26/2014  . HTN (hypertension) 03/22/2014  . Severe obesity (BMI >= 40) 03/22/2014    Past Medical History  Diagnosis Date  . Hypertension   . Anemia     Past Surgical History  Procedure Laterality Date  . Cholecystectomy    . Elbow surgery    . Tubal ligation      Social History  Substance Use Topics  . Smoking status: Never Smoker   . Smokeless tobacco: None  . Alcohol Use: No    Family History  Problem Relation Age of Onset  . Hypertension Mother     Allergies  Allergen Reactions  . Sulfa Antibiotics     Medication list has been reviewed and updated.  Current Outpatient Prescriptions on File Prior to Visit  Medication Sig Dispense Refill  . amLODipine (NORVASC) 10 MG tablet Take 1  tablet (10 mg total) by mouth daily. 30 tablet 5  . ferrous fumarate (HEMOCYTE - 106 MG FE) 325 (106 FE) MG TABS Take 1 tablet by mouth.      . hydrochlorothiazide (HYDRODIURIL) 25 MG tablet Take 1 tablet (25 mg total) by mouth daily. 30 tablet 5  . norethindrone-ethinyl estradiol-iron (LARIN FE 1.5/30) 1.5-30 MG-MCG tablet Take 1 tablet by mouth daily. 28 tablet 11  . valsartan (DIOVAN) 160 MG tablet Take 1 tablet (160 mg total) by mouth daily. 30 tablet 5  . ciprofloxacin (CIPRO) 250 MG tablet Take 1 tablet (250 mg total) by mouth 2 (two) times daily. 10 tablet 0  . fluconazole (DIFLUCAN) 150 MG tablet Take 1 tablet (150 mg total) by mouth once. Repeat if needed (Patient not taking: Reported on 07/31/2015) 2 tablet 0   Current Facility-Administered Medications on File Prior to Visit  Medication Dose Route Frequency Provider Last Rate Last Dose  . ciprofloxacin (CIPRO) tablet 500 mg  500 mg Oral Once Darreld Mclean, MD        Review of Systems:  As per HPI- otherwise negative.   Physical Examination: Filed Vitals:   07/31/15 0928  BP: 138/76  Pulse: 93  Temp: 98.8 F (37.1 C)  Resp: 18   Filed Vitals:   07/31/15 2956  Height: 5' 8.5" (1.74 m)  Weight: 275 lb (124.739 kg)   Body mass index is 41.2 kg/(m^2). Ideal Body Weight: Weight in (lb) to have BMI = 25: 166.5  GEN: WDWN, NAD, Non-toxic, A & O x 3, obese, looks well HEENT: Atraumatic, Normocephalic. Neck supple. No masses, No LAD. Ears and Nose: No external deformity. CV: RRR, No M/G/R. No JVD. No thrill. No extra heart sounds. PULM: CTA B, no wheezes, crackles, rhonchi. No retractions. No resp. distress. No accessory muscle use. ABD: S, NT, ND. No rebound. No HSM.  Benign belly no CVA tenderness  EXTR: No c/c/e NEURO Normal gait.  PSYCH: Normally interactive. Conversant. Not depressed or anxious appearing.  Calm demeanor.   Results for orders placed or performed in visit on 07/31/15  POCT urinalysis dipstick   Result Value Ref Range   Color, UA yellow yellow   Clarity, UA cloudy (A) clear   Glucose, UA negative negative   Bilirubin, UA negative negative   Ketones, POC UA negative negative   Spec Grav, UA 1.020    Blood, UA small (A) negative   pH, UA 6.5    Protein Ur, POC negative negative   Urobilinogen, UA 0.2    Nitrite, UA Negative Negative   Leukocytes, UA Trace (A) Negative  POCT Microscopic Urinalysis ([facility])  Result Value Ref Range   WBC,UR,HPF,POC Few (A) None WBC/hpf   RBC,UR,HPF,POC Few (A) None RBC/hpf   Bacteria Few (A) None   Mucus Absent Absent   Epithelial Cells, UR Per Microscopy Few (A) None cells/hpf    Assessment and Plan: Urinary frequency - Plan: POCT urinalysis dipstick, POCT Microscopic Urinalysis ([facility]), Urine culture, cephALEXin (KEFLEX) 500 MG capsule  Antibiotics causing adverse effect in therapeutic use, initial encounter - Plan: fluconazole (DIFLUCAN) 150 MG tablet  Essential hypertension - Plan: amLODipine (NORVASC) 10 MG tablet, hydrochlorothiazide (HYDRODIURIL) 25 MG tablet, valsartan (DIOVAN) 160 MG tablet  Treat for likely UTI with keflex for one week, await culture Encouraged her to urinate when she feels the needs, and to urinate before and after intercourse.   Also diflucan rx in case she gets a yeast infection   Signed Lamar Blinks, MD

## 2015-07-31 NOTE — Patient Instructions (Addendum)
I will be in touch with your urine .  We are going to treat you for likely UTI with keflex antibiotic  Be sure to urinate before and after intercourse- this will help to prevent UTI.   Also try to be better about using the bathroom when you need to during the day- I know that this is hard! Let me know if you do not feel better in the next few days- Sooner if worse.

## 2015-08-02 LAB — URINE CULTURE: Colony Count: >=100000

## 2015-08-03 ENCOUNTER — Encounter: Payer: Self-pay | Admitting: Family Medicine

## 2015-08-08 ENCOUNTER — Ambulatory Visit (INDEPENDENT_AMBULATORY_CARE_PROVIDER_SITE_OTHER): Payer: 59 | Admitting: Physician Assistant

## 2015-08-08 VITALS — BP 117/79 | HR 81 | Temp 98.3°F | Resp 16 | Ht 69.5 in | Wt 274.6 lb

## 2015-08-08 DIAGNOSIS — M545 Low back pain, unspecified: Secondary | ICD-10-CM

## 2015-08-08 DIAGNOSIS — M62830 Muscle spasm of back: Secondary | ICD-10-CM

## 2015-08-08 MED ORDER — CYCLOBENZAPRINE HCL 5 MG PO TABS: 5.0000 mg | ORAL_TABLET | Freq: Three times a day (TID) | ORAL | Status: DC | PRN

## 2015-08-08 NOTE — Progress Notes (Signed)
   Subjective:    Patient ID: Megan Mitchell, female    DOB: 02-11-1971, 44 y.o.   MRN: [id number]  Chief Complaint  Patient presents with  . Back Pain   Medications, allergies, past medical history, surgical history, family history, social history and problem list reviewed and updated.  HPI  10 yof presents with low back pain.   Grocery shopping 2 days ago, moving groceries from cart to car and while twisting felt sudden pull left lumbar area. Pain has been constant 8/10. No radiation. No sciatica. No numbness or tingling. Denies bowel/bladder dysfunction. Ibuprofen with minimal relief.   Treated for uti last week, denies dysuria.   Review of Systems No fevers, chills.     Objective:   Physical Exam  Constitutional: She is oriented to person, place, and time. She appears well-developed and well-nourished.  Non-toxic appearance. She does not have a sickly appearance. She does not appear ill. No distress.  BP 117/79 mmHg  Pulse 81  Temp(Src) 98.3 F (36.8 C) (Oral)  Resp 16  Ht 5' 9.5" (1.765 m)  Wt 274 lb 9.6 oz (124.558 kg)  BMI 39.98 kg/m2  SpO2 99%  LMP 07/21/2015   Musculoskeletal:       Lumbar back: She exhibits tenderness and spasm. She exhibits no bony tenderness.       Back:  Large palpable spasm left lumbar paraspinals. Negative straight leg raise.   Neurological: She is alert and oriented to person, place, and time.  Psychiatric: She has a normal mood and affect. Her speech is normal and behavior is normal.      Assessment & Plan:   Muscle spasm of back - Plan: cyclobenzaprine (FLEXERIL) 5 MG tablet  Left-sided low back pain without sciatica --suspect muscle strain/spasm as etiology of pain --flexeril, heat, massage, rest, light range of motion --rtc if no improvement one week  [name], PA-C Physician Assistant-Certified [facility]  08/08/2015 11:02 AM

## 2015-08-08 NOTE — Patient Instructions (Signed)
You have a muscle spasm causing your sharp pain in the low back. Please take the flexeril every 8 hours as needed for the spasm.  Keep in mind this can make you drowsy.  Applying heat to the area along with light massage will help.  Please come back to see [place] if you're not better in 1 week.   Muscle Cramps and Spasms Muscle cramps and spasms occur when a muscle or muscles tighten and you have no control over this tightening (involuntary muscle contraction). They are a common problem and can develop in any muscle. The most common place is in the calf muscles of the leg. Both muscle cramps and muscle spasms are involuntary muscle contractions, but they also have differences:   Muscle cramps are sporadic and painful. They may last a few seconds to a quarter of an hour. Muscle cramps are often more forceful and last longer than muscle spasms.  Muscle spasms may or may not be painful. They may also last just a few seconds or much longer. CAUSES  It is uncommon for cramps or spasms to be due to a serious underlying problem. In many cases, the cause of cramps or spasms is unknown. Some common causes are:   Overexertion.   Overuse from repetitive motions (doing the same thing over and over).   Remaining in a certain position for a long period of time.   Improper preparation, form, or technique while performing a sport or activity.   Dehydration.   Injury.   Side effects of some medicines.   Abnormally low levels of the salts and ions in your blood (electrolytes), especially potassium and calcium. This could happen if you are taking water pills (diuretics) or you are pregnant.  Some underlying medical problems can make it more likely to develop cramps or spasms. These include, but are not limited to:   Diabetes.   Parkinson disease.   Hormone disorders, such as thyroid problems.   Alcohol abuse.   Diseases specific to muscles, joints, and bones.   Blood vessel disease  where not enough blood is getting to the muscles.  HOME CARE INSTRUCTIONS   Stay well hydrated. Drink enough water and fluids to keep your urine clear or pale yellow.  It may be helpful to massage, stretch, and relax the affected muscle.  For tight or tense muscles, use a warm towel, heating pad, or hot shower water directed to the affected area.  If you are sore or have pain after a cramp or spasm, applying ice to the affected area may relieve discomfort.  Put ice in a plastic bag.  Place a towel between your skin and the bag.  Leave the ice on for 15-20 minutes, 03-04 times a day.  Medicines used to treat a known cause of cramps or spasms may help reduce their frequency or severity. Only take over-the-counter or prescription medicines as directed by your caregiver. SEEK MEDICAL CARE IF:  Your cramps or spasms get more severe, more frequent, or do not improve over time.  MAKE SURE YOU:   Understand these instructions.  Will watch your condition.  Will get help right away if you are not doing well or get worse.   This information is not intended to replace advice given to you by your health care provider. Make sure you discuss any questions you have with your health care provider.   Document Released: 04/08/2002 Document Revised: 02/11/2013 Document Reviewed: 10/03/2012 Elsevier Interactive Patient Education [employer].

## 2015-09-09 ENCOUNTER — Telehealth: Payer: Self-pay

## 2015-09-09 NOTE — Telephone Encounter (Signed)
She was seen on  Progress Notes      Darreld Mclean, MD at 07/31/2015 9:35 AM     Status: Signed     I believe she has to RTC? Please advise.

## 2015-09-09 NOTE — Telephone Encounter (Signed)
Patient states that she thinks she has another UTI. She wants some medication called to [facility]   [phone] (work) or [phone] (cell)

## 2015-09-09 NOTE — Telephone Encounter (Signed)
Advised pt to RTC 

## 2015-09-09 NOTE — Telephone Encounter (Addendum)
First message was placed at 1:08pm. Patient called again at 3:40pm to check the status. Informed the patient, as I did with the previous phone call, that this could take 24-72 hours to address. She has requested that if we call something in today after 4:00pm she would like it sent to Vladimir Faster at [facility].   Thanks, [employer]

## 2015-09-09 NOTE — Telephone Encounter (Signed)
RTC.  This is a new problem.  Philis Fendt, MS, PA-C   4:12 PM, 09/09/2015

## 2015-09-10 ENCOUNTER — Ambulatory Visit (INDEPENDENT_AMBULATORY_CARE_PROVIDER_SITE_OTHER): Payer: 59 | Admitting: Family Medicine

## 2015-09-10 VITALS — BP 132/80 | HR 85 | Temp 98.4°F | Resp 18 | Ht 69.0 in | Wt 277.8 lb

## 2015-09-10 DIAGNOSIS — N3090 Cystitis, unspecified without hematuria: Secondary | ICD-10-CM | POA: Diagnosis not present

## 2015-09-10 DIAGNOSIS — R300 Dysuria: Secondary | ICD-10-CM | POA: Diagnosis not present

## 2015-09-10 DIAGNOSIS — N9489 Other specified conditions associated with female genital organs and menstrual cycle: Secondary | ICD-10-CM

## 2015-09-10 DIAGNOSIS — N898 Other specified noninflammatory disorders of vagina: Secondary | ICD-10-CM

## 2015-09-10 LAB — POC MICROSCOPIC URINALYSIS (UMFC)

## 2015-09-10 LAB — POCT URINALYSIS DIP (MANUAL ENTRY)
BILIRUBIN UA: NEGATIVE
Glucose, UA: NEGATIVE
Leukocytes, UA: NEGATIVE
NITRITE UA: NEGATIVE
PH UA: 6
PROTEIN UA: NEGATIVE
Spec Grav, UA: 1.025
Urobilinogen, UA: 0.2

## 2015-09-10 MED ORDER — METRONIDAZOLE 500 MG PO TABS: 500.0000 mg | ORAL_TABLET | Freq: Two times a day (BID) | ORAL | Status: DC

## 2015-09-10 MED ORDER — NITROFURANTOIN MONOHYD MACRO 100 MG PO CAPS: 100.0000 mg | ORAL_CAPSULE | Freq: Two times a day (BID) | ORAL | Status: DC

## 2015-09-10 NOTE — Patient Instructions (Addendum)
Drink plenty of fluids  Take the nitrofurantoin one twice daily for infection  If the vaginal odor persists, then get the prescription for metronidazole filled and take one twice daily  If problems persist she will need a vaginal exam  Return if not improving  Urinary Tract Infection Urinary tract infections (UTIs) can develop anywhere along your urinary tract. Your urinary tract is your body's drainage system for removing wastes and extra water. Your urinary tract includes two kidneys, two ureters, a bladder, and a urethra. Your kidneys are a pair of bean-shaped organs. Each kidney is about the size of your fist. They are located below your ribs, one on each side of your spine. CAUSES Infections are caused by microbes, which are microscopic organisms, including fungi, viruses, and bacteria. These organisms are so small that they can only be seen through a microscope. Bacteria are the microbes that most commonly cause UTIs. SYMPTOMS  Symptoms of UTIs may vary by age and gender of the patient and by the location of the infection. Symptoms in young women typically include a frequent and intense urge to urinate and a painful, burning feeling in the bladder or urethra during urination. Older women and men are more likely to be tired, shaky, and weak and have muscle aches and abdominal pain. A fever may mean the infection is in your kidneys. Other symptoms of a kidney infection include pain in your back or sides below the ribs, nausea, and vomiting. DIAGNOSIS To diagnose a UTI, your caregiver will ask you about your symptoms. Your caregiver will also ask you to provide a urine sample. The urine sample will be tested for bacteria and white blood cells. White blood cells are made by your body to help fight infection. TREATMENT  Typically, UTIs can be treated with medication. Because most UTIs are caused by a bacterial infection, they usually can be treated with the use of antibiotics. The choice of  antibiotic and length of treatment depend on your symptoms and the type of bacteria causing your infection. HOME CARE INSTRUCTIONS  If you were prescribed antibiotics, take them exactly as your caregiver instructs you. Finish the medication even if you feel better after you have only taken some of the medication.  Drink enough water and fluids to keep your urine clear or pale yellow.  Avoid caffeine, tea, and carbonated beverages. They tend to irritate your bladder.  Empty your bladder often. Avoid holding urine for long periods of time.  Empty your bladder before and after sexual intercourse.  After a bowel movement, women should cleanse from front to back. Use each tissue only once. SEEK MEDICAL CARE IF:   You have back pain.  You develop a fever.  Your symptoms do not begin to resolve within 3 days. SEEK IMMEDIATE MEDICAL CARE IF:   You have severe back pain or lower abdominal pain.  You develop chills.  You have nausea or vomiting.  You have continued burning or discomfort with urination. MAKE SURE YOU:   Understand these instructions.  Will watch your condition.  Will get help right away if you are not doing well or get worse.   This information is not intended to replace advice given to you by your health care provider. Make sure you discuss any questions you have with your health care provider.   Document Released: 07/27/2005 Document Revised: 07/08/2015 Document Reviewed: 11/25/2011 Elsevier Interactive Patient Education [employer].

## 2015-09-10 NOTE — Progress Notes (Signed)
Patient ID: Megan Mitchell, female    DOB: 07-22-1971  Age: 44 y.o. MRN: [id number]  Chief Complaint  Patient presents with  . Urinary Tract Infection    Subjective:   44 year old lady who has been having problems with some discomfort when her bladder is been over 4 and she urinates and stops she started hurting a little. She has not been running fevers. She has not had nausea or vomiting. She has not had nocturia. She has some frequency just because of her diuretic medication. She does not feel certain that she is having a lot of other frequency. She does not have any abnormal bleeding, he is on contraceptive pills and has regular cycles from that. She has not been having excessive pain with intercourse. She has a history of some UTIs in the past, last treated here couple of months ago.  Current allergies, medications, problem list, past/family and social histories reviewed.  Objective:  BP 132/80 mmHg  Pulse 85  Temp(Src) 98.4 F (36.9 C) (Oral)  Resp 18  Ht 5\' 9"  (1.753 m)  Wt 277 lb 12.8 oz (126.009 kg)  BMI 41.01 kg/m2  SpO2 99%  LMP 09/02/2015  Abdomen soft without masses or tenderness. No CVA tenderness. Genitourinary exam not done.  Results for orders placed or performed in visit on 09/10/15  POCT Microscopic Urinalysis ([facility])  Result Value Ref Range   WBC,UR,HPF,POC Moderate (A) None WBC/hpf   RBC,UR,HPF,POC Few (A) None RBC/hpf   Bacteria Few (A) None, Too numerous to count   Mucus Present (A) Absent   Epithelial Cells, UR Per Microscopy Few (A) None, Too numerous to count cells/hpf  POCT urinalysis dipstick  Result Value Ref Range   Color, UA yellow yellow   Clarity, UA hazy (A) clear   Glucose, UA negative negative   Bilirubin, UA negative negative   Ketones, POC UA trace (5) (A) negative   Spec Grav, UA 1.025    Blood, UA trace-intact (A) negative   pH, UA 6.0    Protein Ur, POC negative negative   Urobilinogen, UA 0.2    Nitrite, UA Negative Negative   Leukocytes, UA Negative Negative     Assessment & Plan:   Assessment: 1. Dysuria   2. Cystitis   3. Vaginal odor       Plan: Urinalysis.  She does mention that she has had a little bit of a nonspecific discharge.  Orders Placed This Encounter  Procedures  . POCT Microscopic Urinalysis ([facility])  . POCT urinalysis dipstick    Meds ordered this encounter  Medications  . nitrofurantoin, macrocrystal-monohydrate, (MACROBID) 100 MG capsule    Sig: Take 1 capsule (100 mg total) by mouth 2 (two) times daily.    Dispense:  20 capsule    Refill:  0  . metroNIDAZOLE (FLAGYL) 500 MG tablet    Sig: Take 1 tablet (500 mg total) by mouth 2 (two) times daily with a meal. DO NOT CONSUME ALCOHOL WHILE TAKING THIS MEDICATION.    Dispense:  14 tablet    Refill:  0         Patient Instructions  Drink plenty of fluids  Take the nitrofurantoin one twice daily for infection  If the vaginal odor persists, then get the prescription for metronidazole filled and take one twice daily  If problems persist she will need a vaginal exam  Return if not improving  Urinary Tract Infection Urinary tract infections (UTIs) can develop anywhere along your urinary tract. Your urinary tract  is your body's drainage system for removing wastes and extra water. Your urinary tract includes two kidneys, two ureters, a bladder, and a urethra. Your kidneys are a pair of bean-shaped organs. Each kidney is about the size of your fist. They are located below your ribs, one on each side of your spine. CAUSES Infections are caused by microbes, which are microscopic organisms, including fungi, viruses, and bacteria. These organisms are so small that they can only be seen through a microscope. Bacteria are the microbes that most commonly cause UTIs. SYMPTOMS  Symptoms of UTIs may vary by age and gender of the patient and by the location of the infection. Symptoms in young women typically include a frequent and  intense urge to urinate and a painful, burning feeling in the bladder or urethra during urination. Older women and men are more likely to be tired, shaky, and weak and have muscle aches and abdominal pain. A fever may mean the infection is in your kidneys. Other symptoms of a kidney infection include pain in your back or sides below the ribs, nausea, and vomiting. DIAGNOSIS To diagnose a UTI, your caregiver will ask you about your symptoms. Your caregiver will also ask you to provide a urine sample. The urine sample will be tested for bacteria and white blood cells. White blood cells are made by your body to help fight infection. TREATMENT  Typically, UTIs can be treated with medication. Because most UTIs are caused by a bacterial infection, they usually can be treated with the use of antibiotics. The choice of antibiotic and length of treatment depend on your symptoms and the type of bacteria causing your infection. HOME CARE INSTRUCTIONS  If you were prescribed antibiotics, take them exactly as your caregiver instructs you. Finish the medication even if you feel better after you have only taken some of the medication.  Drink enough water and fluids to keep your urine clear or pale yellow.  Avoid caffeine, tea, and carbonated beverages. They tend to irritate your bladder.  Empty your bladder often. Avoid holding urine for long periods of time.  Empty your bladder before and after sexual intercourse.  After a bowel movement, women should cleanse from front to back. Use each tissue only once. SEEK MEDICAL CARE IF:   You have back pain.  You develop a fever.  Your symptoms do not begin to resolve within 3 days. SEEK IMMEDIATE MEDICAL CARE IF:   You have severe back pain or lower abdominal pain.  You develop chills.  You have nausea or vomiting.  You have continued burning or discomfort with urination. MAKE SURE YOU:   Understand these instructions.  Will watch your  condition.  Will get help right away if you are not doing well or get worse.   This information is not intended to replace advice given to you by your health care provider. Make sure you discuss any questions you have with your health care provider.   Document Released: 07/27/2005 Document Revised: 07/08/2015 Document Reviewed: 11/25/2011 Elsevier Interactive Patient Education [employer].      did not give any Diflucan but if she calls back that she has yeast type symptoms we might need to give her some.   Return if symptoms worsen or fail to improve.   Shakeita Vandevander, MD 09/10/2015

## 2015-09-28 ENCOUNTER — Other Ambulatory Visit: Payer: Self-pay | Admitting: Family Medicine

## 2015-09-29 NOTE — Telephone Encounter (Signed)
Pt  States [facility] pharmacy has requested a refill on her Flagyl(she left hers in hotel room in [place]),and has received no response   Best phone for pt is [phone]

## 2015-11-09 ENCOUNTER — Ambulatory Visit (INDEPENDENT_AMBULATORY_CARE_PROVIDER_SITE_OTHER): Payer: 59 | Admitting: Internal Medicine

## 2015-11-09 ENCOUNTER — Telehealth: Payer: Self-pay

## 2015-11-09 VITALS — BP 116/70 | HR 82 | Temp 98.0°F | Resp 16 | Ht 68.5 in | Wt 276.8 lb

## 2015-11-09 DIAGNOSIS — J069 Acute upper respiratory infection, unspecified: Secondary | ICD-10-CM

## 2015-11-09 DIAGNOSIS — H66003 Acute suppurative otitis media without spontaneous rupture of ear drum, bilateral: Secondary | ICD-10-CM

## 2015-11-09 DIAGNOSIS — B9789 Other viral agents as the cause of diseases classified elsewhere: Secondary | ICD-10-CM

## 2015-11-09 DIAGNOSIS — H60393 Other infective otitis externa, bilateral: Secondary | ICD-10-CM | POA: Diagnosis not present

## 2015-11-09 MED ORDER — FLUCONAZOLE 150 MG PO TABS: 150.0000 mg | ORAL_TABLET | Freq: Once | ORAL | Status: DC

## 2015-11-09 MED ORDER — NEOMYCIN-POLYMYXIN-HC 3.5-10000-1 OT SUSP: 3.0000 [drp] | Freq: Two times a day (BID) | OTIC | Status: DC

## 2015-11-09 MED ORDER — AMOXICILLIN 875 MG PO TABS: 875.0000 mg | ORAL_TABLET | Freq: Two times a day (BID) | ORAL | Status: DC

## 2015-11-09 MED ORDER — HYDROCODONE-HOMATROPINE 5-1.5 MG/5ML PO SYRP: 5.0000 mL | ORAL_SOLUTION | Freq: Four times a day (QID) | ORAL | Status: DC | PRN

## 2015-11-09 NOTE — Telephone Encounter (Signed)
Advised pt to RTC. Pt understood. 

## 2015-11-09 NOTE — Telephone Encounter (Signed)
Patient has a sinus infection and wants to know if we can send something in.

## 2015-11-09 NOTE — Progress Notes (Addendum)
Subjective:  By signing my name below, I, [name], attest that this documentation has been prepared under the direction and in the presence of Baxter Kail, MD.  Electronically Signed: Thea Alken, ED Scribe. 11/09/2015. 5:13 PM.   Patient ID: Megan Mitchell, female    DOB: 04-07-1971, 45 y.o.   MRN: [id number]  HPI   Chief Complaint  Patient presents with  . Nasal Congestion    x 3 days  . Headache  . Ear Fullness  . Sore Throat    describes as scratchy   HPI Comments: Megan Mitchell is a 45 y.o. female who presents to the [facility] complaining of otalgia. She reports symptoms started 2-3 days ago with a scratchy throat which gradually worsened to a sore throat along with cough, congestion and otalgia. She uses Qtips in her ear occasionally. She denies nausea, emesis and fever. Pt is allergic to sulfa abx.    Past Medical History  Diagnosis Date  . Hypertension   . Anemia    Past Surgical History  Procedure Laterality Date  . Cholecystectomy    . Elbow surgery    . Tubal ligation     Prior to Admission medications   Medication Sig Start Date End Date Taking? Authorizing Provider  amLODipine (NORVASC) 10 MG tablet Take 1 tablet (10 mg total) by mouth daily. 07/31/15  Yes Gay Filler Copland, MD  ferrous fumarate (HEMOCYTE - 106 MG FE) 325 (106 FE) MG TABS Take 1 tablet by mouth.     Yes Historical Provider, MD  hydrochlorothiazide (HYDRODIURIL) 25 MG tablet Take 1 tablet (25 mg total) by mouth daily. 07/31/15  Yes Gay Filler Copland, MD  norethindrone-ethinyl estradiol-iron (LARIN FE 1.5/30) 1.5-30 MG-MCG tablet Take 1 tablet by mouth daily. 04/22/15  Yes Bennett Scrape V, PA-C  valsartan (DIOVAN) 160 MG tablet Take 1 tablet (160 mg total) by mouth daily. 07/31/15  Yes Gay Filler Copland, MD  cyclobenzaprine (FLEXERIL) 5 MG tablet Take 1 tablet (5 mg total) by mouth 3 (three) times daily as needed for muscle spasms. Patient not taking: Reported on 11/09/2015  08/08/15   Araceli Bouche, PA  fluconazole (DIFLUCAN) 150 MG tablet Take 1 tablet (150 mg total) by mouth once. Repeat if needed Patient not taking: Reported on 11/09/2015 07/31/15   Gay Filler Copland, MD  metroNIDAZOLE (FLAGYL) 500 MG tablet TAKE 1 TABLET BY MOUTH TWICE DAILY WITH A MEAL. *DO NOT CONSUME ANY ALCOHOL WHILE TAKING THIS MEDICATION* Patient not taking: Reported on 11/09/2015 09/29/15   Mancel Bale, PA-C  nitrofurantoin, macrocrystal-monohydrate, (MACROBID) 100 MG capsule Take 1 capsule (100 mg total) by mouth 2 (two) times daily. Patient not taking: Reported on 11/09/2015 09/10/15   Posey Boyer, MD   Review of Systems  Constitutional: Negative for fever and chills.  HENT: Positive for congestion, ear pain and sore throat.   Respiratory: Positive for cough. Negative for shortness of breath.   Gastrointestinal: Negative for nausea and vomiting.  Neurological: Positive for headaches.    Objective:   Physical Exam  Constitutional: She is oriented to person, place, and time. She appears well-developed and well-nourished. No distress.  HENT:  Head: Normocephalic and atraumatic.  Right Ear: Tympanic membrane is erythematous.  Left Ear: Tympanic membrane is erythematous.  Nose: Nose normal.  Mouth/Throat: Uvula is midline, oropharynx is clear and moist and mucous membranes are normal. No oropharyngeal exudate or posterior oropharyngeal erythema.  Cerumen present bilaterally. Skin of ear canal are  irritated.  Eyes: Conjunctivae and EOM are normal.  Neck: Neck supple.  Cardiovascular: Normal rate.   Pulmonary/Chest: Effort normal and breath sounds normal. She has no wheezes. She has no rales.  Musculoskeletal: Normal range of motion.  Neurological: She is alert and oriented to person, place, and time.  Skin: Skin is warm and dry.  Psychiatric: She has a normal mood and affect. Her behavior is normal.  Nursing note and vitals reviewed.  Filed Vitals:   11/09/15 1703  BP: 116/70    Pulse: 82  Temp: 98 F (36.7 C)  TempSrc: Oral  Resp: 16  Height: 5' 8.5" (1.74 m)  Weight: 276 lb 12.8 oz (125.556 kg)  SpO2: 99%   Assessment & Plan:  Acute suppurative otitis media of both ears without spontaneous rupture of tympanic membranes, recurrence not specified  Otitis, externa, infective, bilateral  Viral URI with cough     Meds ordered this encounter  Medications  . amoxicillin (AMOXIL) 875 MG tablet    Sig: Take 1 tablet (875 mg total) by mouth 2 (two) times daily.    Dispense:  20 tablet    Refill:  0  . HYDROcodone-homatropine (HYCODAN) 5-1.5 MG/5ML syrup    Sig: Take 5 mLs by mouth every 6 (six) hours as needed.    Dispense:  120 mL    Refill:  0    I have completed the patient encounter in its entirety as documented by the scribe, with editing by me where necessary. Mikelle Myrick P. Laney Pastor, M.D.   Addend---these were actual orders for 1/9 Meds ordered this encounter  Medications  . amoxicillin (AMOXIL) 875 MG tablet    Sig: Take 1 tablet (875 mg total) by mouth 2 (two) times daily.    Dispense:  20 tablet    Refill:  0  . HYDROcodone-homatropine (HYCODAN) 5-1.5 MG/5ML syrup    Sig: Take 5 mLs by mouth every 6 (six) hours as needed.    Dispense:  120 mL    Refill:  0  . neomycin-polymyxin-hydrocortisone (CORTISPORIN) 3.5-10000-1 otic suspension    Sig: Place 3 drops into the left ear 2 (two) times daily.    Dispense:  10 mL    Refill:  0  . fluconazole (DIFLUCAN) 150 MG tablet    Sig: Take 1 tablet (150 mg total) by mouth once. If reacts to antibiotic    Dispense:  1 tablet    Refill:  1  gets yeast with antibios

## 2015-11-10 ENCOUNTER — Telehealth: Payer: Self-pay

## 2015-11-10 NOTE — Telephone Encounter (Signed)
Maybe an ear drop?

## 2015-11-10 NOTE — Telephone Encounter (Addendum)
Pt states her ears are still hurting really bad and doesn't understand why she wasn't given a pill instead of the other medicine. States she works in the [employer] at [employer] and have to speak on the phone A lot but her ears are really bothering her. Please call before 4:00 to [place]

## 2015-11-10 NOTE — Telephone Encounter (Signed)
Spoke with pt, yes she did, she wants something for the ear to take orally. Her head is still congested really bad. I advised her that the amoxicillin can treat ear infection as well. What should we advise pt.

## 2015-11-10 NOTE — Telephone Encounter (Signed)
She was given amox .Marland Kitchen A pill and cortisporinn otic suspension--added later/not in the note as I forgot to refresh--both sent in on 9th

## 2015-11-10 NOTE — Telephone Encounter (Signed)
Pt is requesting a "PO" medication- would like pain relief// and states the ear drops are running right out/// she is still dealing with congestion.  Please call before 4:00 to [id number] After 4pm  [phone]

## 2015-11-10 NOTE — Telephone Encounter (Signed)
i chose amox as it covers both

## 2015-11-11 NOTE — Telephone Encounter (Signed)
Advised pt. She feels better.

## 2015-11-23 MED FILL — HYDROCHLOROTHIAZIDE 25 MG T: 25 | 90 days supply | Qty: 90 | Fill #0

## 2015-11-23 MED FILL — VALSARTAN 160 MG TABLET: 160 | 30 days supply | Qty: 30 | Fill #1

## 2015-12-25 MED FILL — VALSARTAN 160 MG TABLET: 160 | 30 days supply | Qty: 30 | Fill #2

## 2015-12-25 MED FILL — LARIN FE 1.5-30 TABLET: 1.5-30 | 84 days supply | Qty: 84 | Fill #3

## 2016-01-02 ENCOUNTER — Ambulatory Visit (INDEPENDENT_AMBULATORY_CARE_PROVIDER_SITE_OTHER): Payer: 59 | Admitting: Family Medicine

## 2016-01-02 VITALS — BP 125/78 | HR 76 | Temp 98.5°F | Resp 18 | Ht 68.5 in | Wt 277.1 lb

## 2016-01-02 DIAGNOSIS — F4322 Adjustment disorder with anxiety: Secondary | ICD-10-CM | POA: Diagnosis not present

## 2016-01-02 MED ORDER — HYDROXYZINE HCL 25 MG PO TABS: 25.0000 mg | ORAL_TABLET | Freq: Three times a day (TID) | ORAL | Status: DC | PRN

## 2016-01-02 MED ORDER — BUSPIRONE HCL 10 MG PO TABS: ORAL_TABLET | ORAL | Status: DC

## 2016-01-02 NOTE — Patient Instructions (Signed)
adjustmen Adjustment Disorder Adjustment disorder is an unusually severe reaction to a stressful life event, such as the loss of a job or physical illness. The event may be any stressful event other than the loss of a loved one. Adjustment disorder may affect your feelings, your thinking, how you act, or a combination of these. It may interfere with personal relationships or with the way you are at work, school, or home. People with this disorder are at risk for suicide and substance abuse. They may develop a more serious mental disorder, such as major depressive disorder or post-traumatic stress disorder. SIGNS AND SYMPTOMS  Symptoms may include:  Sadness, depressed mood, or crying spells.  Loss of enjoyment.  Change in appetite or weight.  Sense of loss or hopelessness.  Thoughts of suicide.  Anxiety, worry, or nervousness.  Trouble sleeping.  Avoiding family and friends.  Poor school performance.  Fighting or vandalism.  Reckless driving.  Skipping school.  Poor work Systems analyst.  Ignoring bills. Symptoms of adjustment disorder start within 3 months of the stressful life event. They do not last more than 6 months after the event has ended. DIAGNOSIS  To make a diagnosis, your health care provider will ask about what has happened in your life and how it has affected you. He or she may also ask about your medical history and use of medicines, alcohol, and other substances. Your health care provider may do a physical exam and order lab tests or other studies. You may be referred to a mental health specialist for evaluation. TREATMENT  Treatment options include:  Counseling or talk therapy. Talk therapy is usually provided by mental health specialists.  Medicine. Certain medicines may help with depression, anxiety, and sleep.  Support groups. Support groups offer emotional support, advice, and guidance. They are made up of people who have had similar experiences. HOME CARE  INSTRUCTIONS  Keep all follow-up visits as directed by your health care provider. This is important.  Take medicines only as directed by your health care provider. SEEK MEDICAL CARE IF:  Your symptoms get worse.  SEEK IMMEDIATE MEDICAL CARE IF: You have serious thoughts about hurting yourself or someone else. MAKE SURE YOU:  Understand these instructions.  Will watch your condition.  Will get help right away if you are not doing well or get worse.   This information is not intended to replace advice given to you by your health care provider. Make sure you discuss any questions you have with your health care provider.   Document Released: 06/21/2006 Document Revised: 11/07/2014 Document Reviewed: 03/11/2014 Elsevier Interactive Patient Education 2016 [place] and Stress Management Stress is a normal reaction to life events. It is what you feel when life demands more than you are used to or more than you can handle. Some stress can be useful. For example, the stress reaction can help you catch the last bus of the day, study for a test, or meet a deadline at work. But stress that occurs too often or for too long can cause problems. It can affect your emotional health and interfere with relationships and normal daily activities. Too much stress can weaken your immune system and increase your risk for physical illness. If you already have a medical problem, stress can make it worse. CAUSES  All sorts of life events may cause stress. An event that causes stress for one person may not be stressful for another person. Major life events commonly cause stress. These may be positive  or negative. Examples include losing your job, moving into a new home, getting married, having a baby, or losing a loved one. Less obvious life events may also cause stress, especially if they occur day after day or in combination. Examples include working long hours, driving in traffic, caring for children,  being in debt, or being in a difficult relationship. SIGNS AND SYMPTOMS Stress may cause emotional symptoms including, the following:  Anxiety. This is feeling worried, afraid, on edge, overwhelmed, or out of control.  Anger. This is feeling irritated or impatient.  Depression. This is feeling sad, down, helpless, or guilty.  Difficulty focusing, remembering, or making decisions. Stress may cause physical symptoms, including the following:   Aches and pains. These may affect your head, neck, back, stomach, or other areas of your body.  Tight muscles or clenched jaw.  Low energy or trouble sleeping. Stress may cause unhealthy behaviors, including the following:   Eating to feel better (overeating) or skipping meals.  Sleeping too little, too much, or both.  Working too much or putting off tasks (procrastination).  Smoking, drinking alcohol, or using drugs to feel better. DIAGNOSIS  Stress is diagnosed through an assessment by your health care provider. Your health care provider will ask questions about your symptoms and any stressful life events.Your health care provider will also ask about your medical history and may order blood tests or other tests. Certain medical conditions and medicine can cause physical symptoms similar to stress. Mental illness can cause emotional symptoms and unhealthy behaviors similar to stress. Your health care provider may refer you to a mental health professional for further evaluation.  TREATMENT  Stress management is the recommended treatment for stress.The goals of stress management are reducing stressful life events and coping with stress in healthy ways.  Techniques for reducing stressful life events include the following:  Stress identification. Self-monitor for stress and identify what causes stress for you. These skills may help you to avoid some stressful events.  Time management. Set your priorities, keep a calendar of events, and learn  to say "no." These tools can help you avoid making too many commitments. Techniques for coping with stress include the following:  Rethinking the problem. Try to think realistically about stressful events rather than ignoring them or overreacting. Try to find the positives in a stressful situation rather than focusing on the negatives.  Exercise. Physical exercise can release both physical and emotional tension. The key is to find a form of exercise you enjoy and do it regularly.  Relaxation techniques. These relax the body and mind. Examples include yoga, meditation, tai chi, biofeedback, deep breathing, progressive muscle relaxation, listening to music, being out in nature, journaling, and other hobbies. Again, the key is to find one or more that you enjoy and can do regularly.  Healthy lifestyle. Eat a balanced diet, get plenty of sleep, and do not smoke. Avoid using alcohol or drugs to relax.  Strong support network. Spend time with family, friends, or other people you enjoy being around.Express your feelings and talk things over with someone you trust. Counseling or talktherapy with a mental health professional may be helpful if you are having difficulty managing stress on your own. Medicine is typically not recommended for the treatment of stress.Talk to your health care provider if you think you need medicine for symptoms of stress. HOME CARE INSTRUCTIONS  Keep all follow-up visits as directed by your health care provider.  Take all medicines as directed by your health care  provider. SEEK MEDICAL CARE IF:  Your symptoms get worse or you start having new symptoms.  You feel overwhelmed by your problems and can no longer manage them on your own. SEEK IMMEDIATE MEDICAL CARE IF:  You feel like hurting yourself or someone else.   This information is not intended to replace advice given to you by your health care provider. Make sure you discuss any questions you have with your health  care provider.   Document Released: 04/12/2001 Document Revised: 11/07/2014 Document Reviewed: 06/11/2013 Elsevier Interactive Patient Education [employer].

## 2016-01-02 NOTE — Progress Notes (Signed)
Subjective:  By signing my name below, I, [name], attest that this documentation has been prepared under the direction and in the presence of Delman Cheadle, MD.  Electronically Signed: Thea Alken, ED Scribe. 01/02/2016. 4:09 PM.   Patient ID: Megan Mitchell, female    DOB: 14-Sep-1971, 45 y.o.   MRN: [id number]  HPI   Chief Complaint  Patient presents with  . Anxiety    work related-started about a month ago   HPI Comments: Megan Mitchell is a 45 y.o. female who presents to the [facility] complaining of anxiety. Pt not on any psychiatric meds. 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 She is planning a wedding and planning to buy a house. Pt states over the past couple weeks she's become more fidgety and that she feels like she is going to fall apart. She states she gets upset over little thing which is not normal for her. She states she had a situation at work a couple days ago where a patient "went off" on her causing her to break down and fall apart. She had similar symptoms 15 years ago while going through her divorce. At that time she was not on medication but found relief through counseling with her church. She is not sleeping well at night due to her mind race. She tries to be in bed by 8-8:30 because she wakes up at 5-5:30. She occasionally wakes up around 2-3 but has trouble falling asleep due to her mind racing.  She denies family hx of anxiety. Her old son, 85 y.o., has ADD. No change in appetite, tremors, SOB, and palpitation.    Past Medical History  Diagnosis Date  . Hypertension   . Anemia    Past Surgical History  Procedure Laterality Date  . Cholecystectomy    . Elbow surgery    . Tubal ligation     Prior to Admission medications   Medication Sig Start Date End Date Taking? Authorizing Provider  amLODipine (NORVASC) 10 MG tablet Take 1 tablet (10 mg total) by mouth daily. 07/31/15  Yes Gay Filler Copland, MD  ferrous fumarate (HEMOCYTE  - 106 MG FE) 325 (106 FE) MG TABS Take 1 tablet by mouth.     Yes Historical Provider, MD  hydrochlorothiazide (HYDRODIURIL) 25 MG tablet Take 1 tablet (25 mg total) by mouth daily. 07/31/15  Yes Gay Filler Copland, MD  norethindrone-ethinyl estradiol-iron (LARIN FE 1.5/30) 1.5-30 MG-MCG tablet Take 1 tablet by mouth daily. 04/22/15  Yes Bennett Scrape V, PA-C  valsartan (DIOVAN) 160 MG tablet Take 1 tablet (160 mg total) by mouth daily. 07/31/15  Yes Gay Filler Copland, MD  amoxicillin (AMOXIL) 875 MG tablet Take 1 tablet (875 mg total) by mouth 2 (two) times daily. Patient not taking: Reported on 01/02/2016 11/09/15   Leandrew Koyanagi, MD  fluconazole (DIFLUCAN) 150 MG tablet Take 1 tablet (150 mg total) by mouth once. Repeat if needed Patient not taking: Reported on 11/09/2015 07/31/15   Gay Filler Copland, MD  fluconazole (DIFLUCAN) 150 MG tablet Take 1 tablet (150 mg total) by mouth once. If reacts to antibiotic Patient not taking: Reported on 01/02/2016 11/09/15   Leandrew Koyanagi, MD  HYDROcodone-homatropine [facility]) 5-1.5 MG/5ML syrup Take 5 mLs by mouth every 6 (six) hours as needed. Patient not taking: Reported on 01/02/2016 11/09/15   Leandrew Koyanagi, MD  neomycin-polymyxin-hydrocortisone (CORTISPORIN) 3.5-10000-1 otic suspension Place 3 drops into the left ear  2 (two) times daily. Patient not taking: Reported on 01/02/2016 11/09/15   Leandrew Koyanagi, MD  nitrofurantoin, macrocrystal-monohydrate, (MACROBID) 100 MG capsule Take 1 capsule (100 mg total) by mouth 2 (two) times daily. Patient not taking: Reported on 11/09/2015 09/10/15   Posey Boyer, MD   Review of Systems  Constitutional: Negative for appetite change.  Respiratory: Negative for apnea and shortness of breath.   Cardiovascular: Negative for chest pain and palpitations.  Neurological: Negative for tremors.  Psychiatric/Behavioral: Positive for sleep disturbance. Negative for dysphoric mood. The patient is nervous/anxious.    Objective:     Physical Exam  Constitutional: She is oriented to person, place, and time. She appears well-developed and well-nourished. No distress.  HENT:  Head: Normocephalic and atraumatic.  Eyes: Conjunctivae and EOM are normal.  Neck: Neck supple.  Cardiovascular: Normal rate, regular rhythm and normal heart sounds.   No murmur heard. Pulmonary/Chest: Effort normal and breath sounds normal. No respiratory distress. She has no wheezes. She has no rales. She exhibits no tenderness.  Musculoskeletal: Normal range of motion.  Neurological: She is alert and oriented to person, place, and time.  Skin: Skin is warm and dry.  Psychiatric: She has a normal mood and affect. Her behavior is normal.  Nursing note and vitals reviewed.  Filed Vitals:   01/02/16 1601  BP: 125/78  Pulse: 76  Temp: 98.5 F (36.9 C)  TempSrc: Oral  Resp: 18  Height: 5' 8.5" (1.74 m)  Weight: 277 lb 2 oz (125.703 kg)  SpO2: 97%   Assessment & Plan:   1. Adjustment disorder with anxious mood   Has never been on any mood sxs prior but under a lot of pressure with mult current important life events so try below.  Meds ordered this encounter  Medications  . busPIRone (BUSPAR) 10 MG tablet    Sig: Start 1/2 tab po bid x 1 wk, then 1 tab po qd    Dispense:  60 tablet    Refill:  5  . hydrOXYzine (ATARAX/VISTARIL) 25 MG tablet    Sig: Take 1-2 tablets (25-50 mg total) by mouth 3 (three) times daily as needed for anxiety.    Dispense:  30 tablet    Refill:  1    I personally performed the services described in this documentation, which was scribed in my presence. The recorded information has been reviewed and considered, and addended by me as needed.  Delman Cheadle, MD MPH

## 2016-01-15 ENCOUNTER — Ambulatory Visit (INDEPENDENT_AMBULATORY_CARE_PROVIDER_SITE_OTHER): Payer: 59 | Admitting: Physician Assistant

## 2016-01-15 VITALS — BP 132/82 | HR 94 | Temp 99.0°F | Resp 18 | Ht 68.5 in | Wt 279.0 lb

## 2016-01-15 DIAGNOSIS — F458 Other somatoform disorders: Secondary | ICD-10-CM | POA: Diagnosis not present

## 2016-01-15 DIAGNOSIS — K2100 Gastro-esophageal reflux disease with esophagitis, without bleeding: Secondary | ICD-10-CM

## 2016-01-15 DIAGNOSIS — R0989 Other specified symptoms and signs involving the circulatory and respiratory systems: Secondary | ICD-10-CM

## 2016-01-15 DIAGNOSIS — R198 Other specified symptoms and signs involving the digestive system and abdomen: Secondary | ICD-10-CM

## 2016-01-15 MED ORDER — LORAZEPAM 0.5 MG PO TABS: 0.5000 mg | ORAL_TABLET | Freq: Two times a day (BID) | ORAL | Status: DC | PRN

## 2016-01-15 MED ORDER — RANITIDINE HCL 150 MG PO TABS: 150.0000 mg | ORAL_TABLET | Freq: Two times a day (BID) | ORAL | Status: DC

## 2016-01-15 MED ORDER — ESCITALOPRAM OXALATE 10 MG PO TABS: 10.0000 mg | ORAL_TABLET | Freq: Every day | ORAL | Status: DC

## 2016-01-15 NOTE — Patient Instructions (Signed)
     IF you received an x-ray today, you will receive an invoice from [facility]. Please contact [facility] at [phone] with questions or concerns regarding your invoice.   IF you received labwork today, you will receive an invoice from [facility]. Please contact [facility] at [phone] with questions or concerns regarding your invoice.   Our billing staff will not be able to assist you with questions regarding bills from these companies.  You will be contacted with the lab results as soon as they are available. The fastest way to get your results is to activate your My Chart account. Instructions are located on the last page of this paperwork. If you have not heard from us regarding the results in 2 weeks, please contact this office.      

## 2016-01-15 NOTE — Progress Notes (Signed)
01/21/2016 6:18 AM   DOB: October 11, 1971 / MRN: [id number]  SUBJECTIVE:  Megan Mitchell is a 45 y.o. female presenting for throat tightness. This started two days ago and she feels she is getting worse.  She denies throat pain, neck swelling, fever, dysphagia, odynophagia and difficulty tolerating PO. She is a non smoker. She has no history of ENT surgery.   She complains of increased stress at this time.  Reports her work is very busy right now and she is getting married in a few months. Also reports there is some illness in her family and this is contributing.  Has a long history of increased stress and reports she has had similar sensations of throat tightness.  She denies dysthymic mood and anhedonia.  Does report having some difficulty with falling asleep, however has no problem staying asleep. CHL reveals she has been tried on several nongabaergics.    She does complain of one episode of GERD with esophagitis that started three days ago after eating some takeout.  She took some tums and had good relief with this.  She reports this rarely happens to her.       She is allergic to sulfa antibiotics.   She  has a past medical history of Hypertension and Anemia.    She  reports that she has never smoked. She does not have any smokeless tobacco history on file. She reports that she does not drink alcohol or use illicit drugs. She  reports that she currently engages in sexual activity. She reports using the following method of birth control/protection: Pill. The patient  has past surgical history that includes Cholecystectomy; Elbow surgery; and Tubal ligation.  Her family history includes Hypertension in her mother.  Review of Systems  Constitutional: Negative for fever and chills.  Respiratory: Negative for cough.   Cardiovascular: Negative for chest pain.  Gastrointestinal: Positive for heartburn. Negative for diarrhea.  Skin: Negative for rash.  Neurological: Negative for dizziness and  headaches.  Psychiatric/Behavioral: Negative for depression, hallucinations, memory loss and substance abuse. The patient is nervous/anxious. The patient does not have insomnia.     Problem list and medications reviewed and updated by myself where necessary, and exist elsewhere in the encounter.   OBJECTIVE:  BP 132/82 mmHg  Pulse 94  Temp(Src) 99 F (37.2 C) (Oral)  Resp 18  Ht 5' 8.5" (1.74 m)  Wt 279 lb (126.554 kg)  BMI 41.80 kg/m2  SpO2 99%  LMP 01/08/2016  Physical Exam  Constitutional: She appears well-developed and well-nourished. No distress.  HENT:  Mouth/Throat: Uvula is midline, oropharynx is clear and moist and mucous membranes are normal. No oral lesions. No trismus in the jaw. No uvula swelling. No oropharyngeal exudate, posterior oropharyngeal edema, posterior oropharyngeal erythema or tonsillar abscesses.  Neck: Normal range of motion. No tracheal tenderness present. No rigidity. Normal range of motion present. No thyroid mass and no thyromegaly present.  Pulmonary/Chest: No stridor.  Skin: She is not diaphoretic.    No results found for this or any previous visit (from the past 72 hour(s)).  No results found.  Lab Results  Component Value Date   TSH 2.865 04/22/2015   Lab Results  Component Value Date   WBC 9.5 04/22/2015   HGB 12.6 04/22/2015   HCT 37.8 04/22/2015   MCV 79.1 04/22/2015   PLT 539* 04/22/2015     ASSESSMENT AND PLAN  Megan Mitchell was seen today for trouble swallowing.  Diagnoses and all orders for this visit:  Globus sensation: Her physical exam is normal.  She does feel that stress is causing her symptoms.  Treat her for anxiety and see her back in three months, sooner if needed.   -     escitalopram (LEXAPRO) 10 MG tablet; Take 1 tablet (10 mg total) by mouth daily. -     LORazepam (ATIVAN) 0.5 MG tablet; Take 1 tablet (0.5 mg total) by mouth 2 (two) times daily as needed for anxiety. Take for acute anxiety only.  Gastroesophageal  reflux disease with esophagitis -     ranitidine (ZANTAC) 150 MG tablet; Take 1 tablet (150 mg total) by mouth 2 (two) times daily.   The patient was advised to call or return to clinic if she does not see an improvement in symptoms or to seek the care of the closest emergency department if she worsens with the above plan.   Megan Mitchell, MHS, PA-C [facility] 01/21/2016 6:18 AM

## 2016-01-21 MED FILL — AMLODIPINE BESYLATE 10 MG T: 10 | 90 days supply | Qty: 90 | Fill #1

## 2016-01-21 MED FILL — VALSARTAN 160 MG TABLET: 160 | 30 days supply | Qty: 30 | Fill #3

## 2016-02-16 ENCOUNTER — Ambulatory Visit: Payer: 59 | Admitting: Dietician

## 2016-02-26 MED FILL — HYDROCHLOROTHIAZIDE 25 MG T: 25 | 90 days supply | Qty: 90 | Fill #1

## 2016-02-26 MED FILL — VALSARTAN 160 MG TABLET: 160 | 30 days supply | Qty: 30 | Fill #4

## 2016-03-01 ENCOUNTER — Other Ambulatory Visit: Payer: Self-pay | Admitting: Physician Assistant

## 2016-03-11 NOTE — Telephone Encounter (Signed)
close

## 2016-03-23 MED FILL — LARIN FE 1.5-30 TABLET: 1.5-30 | 28 days supply | Qty: 28 | Fill #0

## 2016-03-23 MED FILL — VALSARTAN 160 MG TABLET: 160 | 30 days supply | Qty: 30 | Fill #5

## 2016-03-25 MED FILL — FLUCONAZOLE 150 MG TABLET: 150 | 1 days supply | Qty: 1 | Fill #0

## 2016-04-27 ENCOUNTER — Ambulatory Visit (INDEPENDENT_AMBULATORY_CARE_PROVIDER_SITE_OTHER): Payer: 59

## 2016-04-27 ENCOUNTER — Ambulatory Visit (INDEPENDENT_AMBULATORY_CARE_PROVIDER_SITE_OTHER): Payer: 59 | Admitting: Physician Assistant

## 2016-04-27 VITALS — BP 130/86 | HR 81 | Temp 98.2°F | Resp 18 | Ht 68.5 in | Wt 281.0 lb

## 2016-04-27 DIAGNOSIS — M25561 Pain in right knee: Secondary | ICD-10-CM

## 2016-04-27 DIAGNOSIS — M179 Osteoarthritis of knee, unspecified: Secondary | ICD-10-CM | POA: Diagnosis not present

## 2016-04-27 DIAGNOSIS — I10 Essential (primary) hypertension: Secondary | ICD-10-CM | POA: Diagnosis not present

## 2016-04-27 DIAGNOSIS — Z3040 Encounter for surveillance of contraceptives, unspecified: Secondary | ICD-10-CM | POA: Diagnosis not present

## 2016-04-27 MED ORDER — HYDROCHLOROTHIAZIDE 25 MG PO TABS: 25.0000 mg | ORAL_TABLET | Freq: Every day | ORAL | Status: DC

## 2016-04-27 MED ORDER — NORETHIN ACE-ETH ESTRAD-FE 1.5-30 MG-MCG PO TABS: 1.0000 | ORAL_TABLET | Freq: Every day | ORAL | Status: DC

## 2016-04-27 MED ORDER — VALSARTAN 160 MG PO TABS: 160.0000 mg | ORAL_TABLET | Freq: Every day | ORAL | Status: DC

## 2016-04-27 MED FILL — LARIN FE 1.5-30 TABLET: 1.5-30 | 84 days supply | Qty: 84 | Fill #0

## 2016-04-27 NOTE — Progress Notes (Signed)
04/27/2016 2:38 PM   DOB: 1970-12-04 / MRN: [id number]  SUBJECTIVE:  Megan Mitchell is a 45 y.o. female presenting for medication refills and knee pain.  She would like her BP medications refilled today and has a history of well controlled HTN. She denies chest pain, SOB, DOE, presyncope and diaphoresis.  She takes her medication without fail.    She would like her OCP refilled today for one year.  She is a non smoker and denies leg pain and swelling.  She is not missing doses.   She complains right knee pain that is worse in the morning and feels better once warmed up.  The pain is mostly medial.  There has been no frank injury.  She thinks her weight is contributing to the pain.  She denies a history of trauma to the knee.    She is allergic to sulfa antibiotics.   She  has a past medical history of Hypertension and Anemia.    She  reports that she has never smoked. She does not have any smokeless tobacco history on file. She reports that she does not drink alcohol or use illicit drugs. She  reports that she currently engages in sexual activity. She reports using the following method of birth control/protection: Pill. The patient  has past surgical history that includes Cholecystectomy; Elbow surgery; and Tubal ligation.  Her family history includes Hypertension in her mother.  Review of Systems  Constitutional: Negative for fever and chills.  Skin: Negative for itching and rash.  Neurological: Negative for headaches.    Problem list and medications reviewed and updated by myself where necessary, and exist elsewhere in the encounter.   OBJECTIVE:  BP 130/86 mmHg  Pulse 81  Temp(Src) 98.2 F (36.8 C) (Oral)  Resp 18  Ht 5' 8.5" (1.74 m)  Wt 281 lb (127.461 kg)  BMI 42.10 kg/m2  SpO2 99%  LMP 04/26/2016  Physical Exam  Constitutional: She is oriented to person, place, and time. She appears well-nourished. No distress.  Eyes: EOM are normal. Pupils are equal, round, and  reactive to light.  Cardiovascular: Normal rate, regular rhythm and normal heart sounds.   Pulmonary/Chest: Effort normal and breath sounds normal.  Abdominal: She exhibits no distension.  Musculoskeletal: Normal range of motion.       Right knee: She exhibits normal range of motion, no swelling, no effusion, no erythema, normal patellar mobility, no bony tenderness, normal meniscus and no MCL laxity. Tenderness found. Medial joint line tenderness noted. No lateral joint line, no MCL, no LCL and no patellar tendon tenderness noted.  Neurological: She is alert and oriented to person, place, and time. No cranial nerve deficit. Gait normal.  Skin: Skin is dry. She is not diaphoretic.  Psychiatric: She has a normal mood and affect.  Vitals reviewed.   No results found for this or any previous visit (from the past 72 hour(s)).  No results found.  ASSESSMENT AND PLAN  [name] was seen today for knee pain and medication refill.  Diagnoses and all orders for this visit:  Essential hypertension -     Basic metabolic panel -     hydrochlorothiazide (HYDRODIURIL) 25 MG tablet; Take 1 tablet (25 mg total) by mouth daily. -     valsartan (DIOVAN) 160 MG tablet; Take 1 tablet (160 mg total) by mouth daily.  Encounter for surveillance of contraceptives -     norethindrone-ethinyl estradiol-iron (LARIN FE 1.5/30) 1.5-30 MG-MCG tablet; Take 1 tablet by mouth  daily.  Acute knee pain, right: Rads showing mild OA.  Advised she exercise and lose weight.  Tylenol as needed, ibuprofen for flares as outline in the AVS.  RTC if this fails to help and will consider a brace.   -     DG Knee Complete 4 Views Right; Future    The patient was advised to call or return to clinic if she does not see an improvement in symptoms or to seek the care of the closest emergency department if she worsens with the above plan.   Philis Fendt, MHS, PA-C [facility] 04/27/2016  2:38 PM

## 2016-04-27 NOTE — Patient Instructions (Addendum)
For knee arthritis exercise often and take Tylenol [phone] mg every 8 hours as needed for pain.  If you are having a flare then its okay to take 400 mg of Ibuprofen every eight hours for 7 days.      IF you received an x-ray today, you will receive an invoice from [facility]. Please contact [facility] at [phone] with questions or concerns regarding your invoice.   IF you received labwork today, you will receive an invoice from [facility]. Please contact [facility] at [phone] with questions or concerns regarding your invoice.   Our billing staff will not be able to assist you with questions regarding bills from these companies.  You will be contacted with the lab results as soon as they are available. The fastest way to get your results is to activate your My Chart account. Instructions are located on the last page of this paperwork. If you have not heard from [place] regarding the results in 2 weeks, please contact this office.

## 2016-04-28 MED FILL — VALSARTAN 160 MG TABLET: 160 | 90 days supply | Qty: 90 | Fill #0

## 2016-04-29 MED FILL — AMLODIPINE BESYLATE 10 MG T: 10 | 90 days supply | Qty: 90 | Fill #2

## 2016-05-25 ENCOUNTER — Encounter: Payer: Self-pay | Admitting: Skilled Nursing Facility1

## 2016-05-25 ENCOUNTER — Encounter: Payer: 59 | Attending: Family Medicine | Admitting: Skilled Nursing Facility1

## 2016-05-25 DIAGNOSIS — Z713 Dietary counseling and surveillance: Secondary | ICD-10-CM | POA: Diagnosis not present

## 2016-05-25 NOTE — Progress Notes (Signed)
  Medical Nutrition Therapy:  Appt start time: 1700 end time:  1730.   Assessment:  Primary concerns today: [employer] employee. Pt states she just wants her wellness points but now wants to lose wt. Pt states she has cut out sugar. Pts states she wants to lose 20-30 pounds. Pts diet hx: slim fast and exercising when she was younger. Pt states she will continue to do what she is doing and was considering calorie counting and the sleeve gastrectomy but maybe will not.  Preferred Learning Style:   No preference indicated   Learning Readiness:   Change in progress  MEDICATIONS: See List    Usual physical activity:  30 minutes a day   Progress Towards Goal(s):  In progress.  Intervention:  Nutrition counseling for obesity. Dietitian answered the pts questions and educated on general healthy habits with the desired effect of wt loss.  Teaching Method Utilized:  Visual Auditory Hands on   Barriers to learning/adherence to lifestyle change: none identified  Demonstrated degree of understanding via:  Teach Back   Monitoring/Evaluation:  Dietary intake, exercise, and body weight prn.

## 2016-06-09 MED FILL — HYDROCHLOROTHIAZIDE 25 MG T: 25 | 90 days supply | Qty: 90 | Fill #2

## 2016-06-25 ENCOUNTER — Ambulatory Visit (INDEPENDENT_AMBULATORY_CARE_PROVIDER_SITE_OTHER): Payer: 59 | Admitting: Physician Assistant

## 2016-06-25 ENCOUNTER — Encounter: Payer: Self-pay | Admitting: Physician Assistant

## 2016-06-25 VITALS — BP 130/80 | HR 71 | Temp 98.8°F | Resp 17 | Ht 68.0 in | Wt 278.0 lb

## 2016-06-25 DIAGNOSIS — F411 Generalized anxiety disorder: Secondary | ICD-10-CM

## 2016-06-25 NOTE — Patient Instructions (Signed)
     IF you received an x-ray today, you will receive an invoice from [facility]. Please contact [facility] at [phone] with questions or concerns regarding your invoice.   IF you received labwork today, you will receive an invoice from [facility]. Please contact [facility] at [phone] with questions or concerns regarding your invoice.   Our billing staff will not be able to assist you with questions regarding bills from these companies.  You will be contacted with the lab results as soon as they are available. The fastest way to get your results is to activate your My Chart account. Instructions are located on the last page of this paperwork. If you have not heard from us regarding the results in 2 weeks, please contact this office.      

## 2016-06-25 NOTE — Progress Notes (Signed)
06/25/2016 10:10 AM   DOB: 1971-09-19 / MRN: [id number]  SUBJECTIVE:  Megan Mitchell is a 45 y.o. female presenting for acute anxiety.  I have seen her in the past and treated her for anxiety and she did not follow through with the treatment plan.  Reports that she is increasingly stressed out right now due to some changes at work.  She is also closing on a house and this is stressful as well.  She denies any chest pain, SOB.  She has her meds with her today which includes a benzodiazepine and Lexapro.  She is will to start this today to get some relief.   GAD 7 : Generalized Anxiety Score 06/25/2016  Nervous, Anxious, on Edge 3  Control/stop worrying 2  Worry too much - different things 3  Trouble relaxing 3  Restless 1  Easily annoyed or irritable 1  Afraid - awful might happen 1  Total GAD 7 Score 14  Anxiety Difficulty Somewhat difficult      She is allergic to sulfa antibiotics.   She  has a past medical history of Anemia and Hypertension.    She  reports that she has never smoked. She does not have any smokeless tobacco history on file. She reports that she does not drink alcohol or use drugs. She  reports that she currently engages in sexual activity. She reports using the following method of birth control/protection: Pill. The patient  has a past surgical history that includes Cholecystectomy; Elbow surgery; and Tubal ligation.  Her family history includes Hypertension in her mother.  Review of Systems  Constitutional: Negative for fever.  Gastrointestinal: Negative for nausea.  Neurological: Negative for dizziness, tingling, loss of consciousness and headaches.  Psychiatric/Behavioral: Negative for depression, hallucinations, memory loss, substance abuse and suicidal ideas. The patient is nervous/anxious. The patient does not have insomnia.     The problem list and medications were reviewed and updated by myself where necessary and exist elsewhere in the encounter.    OBJECTIVE:  BP 130/80 (BP Location: Right Arm, Patient Position: Sitting, Cuff Size: Large)   Pulse 71   Temp 98.8 F (37.1 C) (Oral)   Resp 17   Ht 5\' 8"  (1.727 m)   Wt 278 lb (126.1 kg)   LMP 06/21/2016 (Approximate)   SpO2 97%   BMI 42.27 kg/m   Physical Exam  Constitutional: She is oriented to person, place, and time. She appears well-nourished. No distress.  Eyes: EOM are normal. Pupils are equal, round, and reactive to light.  Cardiovascular: Normal rate.   Pulmonary/Chest: Effort normal.  Abdominal: She exhibits no distension.  Neurological: She is alert and oriented to person, place, and time. No cranial nerve deficit. Gait normal.  Skin: Skin is dry. She is not diaphoretic.  Psychiatric: Her speech is normal and behavior is normal. Judgment and thought content normal. Her mood appears anxious. Her affect is not labile. She does not exhibit a depressed mood.  Vitals reviewed.   No results found for this or any previous visit (from the past 72 hour(s)).  No results found.  ASSESSMENT AND PLAN  Marcine was seen today for anxiety.  Diagnoses and all orders for this visit:  GAD (generalized anxiety disorder): See GAD7.  Advised she start her SSRI and take her benoz as needed.  Mechanisms of medications explained with particular attention to abuse potential of benzo and the delayed relief effect of Lexapro.  She understands and will follow through with treatment this time.  RTC as needed.  Call for refills.     The patient is advised to call or return to clinic if she does not see an improvement in symptoms, or to seek the care of the closest emergency department if she worsens with the above plan.   Philis Fendt, MHS, PA-C [facility] 06/25/2016 10:10 AM

## 2016-06-28 ENCOUNTER — Telehealth: Payer: Self-pay

## 2016-06-28 NOTE — Telephone Encounter (Signed)
Patient states that she was advised to call if the lexapro doesn't work. Patient states that it causing stomach pain and nausea. Patient already has a prescription for hydroxyzine and wants to know if taking this will be okay.  [phone]

## 2016-06-29 ENCOUNTER — Other Ambulatory Visit: Payer: Self-pay | Admitting: Physician Assistant

## 2016-06-29 DIAGNOSIS — F411 Generalized anxiety disorder: Secondary | ICD-10-CM

## 2016-06-29 MED ORDER — FLUOXETINE HCL 20 MG PO CAPS: 20.0000 mg | ORAL_CAPSULE | Freq: Every day | ORAL | 3 refills | Status: DC

## 2016-06-29 NOTE — Progress Notes (Signed)
Patient having too much nausea with Lexapro.  Will stop that and start prozac 20 mg daily. Philis Fendt, MS, PA-C 12:07 PM, 06/29/2016

## 2016-06-29 NOTE — Telephone Encounter (Signed)
Please call her. Will start her on Fluoxetine 20 mg.  Sending to pharmacy via orders only.  Stop Lexapro today.  Start Fluoxetine today and continue Klonopin only as needed. Philis Fendt, MS, PA-C 12:04 PM, 06/29/2016

## 2016-06-30 ENCOUNTER — Telehealth: Payer: Self-pay

## 2016-06-30 NOTE — Telephone Encounter (Signed)
You do not have to wean off of fluoxetine as the half life is very long. This is used for Anxiety and/or depression. Philis Fendt, MS, PA-C 5:07 PM, 06/30/2016

## 2016-06-30 NOTE — Telephone Encounter (Signed)
Pharmacy advised patient that fluoxetine was for depression and that she would have to wean off of the fluoxetine if she started to take it.  She wanted to know why she would have to wean off.

## 2016-06-30 NOTE — Telephone Encounter (Signed)
Patient stated the medication that was called in is for depression. The pharmacist explained to her husband that if she take the medication then she may need to take another medication to get off of it. If you call after 4 please contact patient at [phone].

## 2016-06-30 NOTE — Telephone Encounter (Signed)
See other phone note

## 2016-07-01 ENCOUNTER — Telehealth: Payer: Self-pay | Admitting: Emergency Medicine

## 2016-07-01 NOTE — Telephone Encounter (Signed)
Called patient to follow up with medication concerns. Pt will start taking Fluoxetine today and will call us if any problems next week.   Informed her, she will not need to wean off medication if taking more than 30 days

## 2016-07-18 MED FILL — LARIN FE 1.5-30 TABLET: 1.5-30 | 84 days supply | Qty: 84 | Fill #1

## 2016-07-18 MED FILL — FLUCONAZOLE 150 MG TABLET: 150 | 1 days supply | Qty: 1 | Fill #1

## 2016-07-27 MED FILL — AMLODIPINE BESYLATE 10 MG T: 10 | 90 days supply | Qty: 90 | Fill #3

## 2016-07-27 MED FILL — VALSARTAN 160 MG TABLET: 160 | 90 days supply | Qty: 90 | Fill #1

## 2016-09-05 MED FILL — HYDROCHLOROTHIAZIDE 25 MG T: 25 | 90 days supply | Qty: 90 | Fill #0

## 2016-10-03 MED FILL — LARIN FE 1.5-30 TABLET: 1.5-30 | 84 days supply | Qty: 84 | Fill #2

## 2016-10-28 ENCOUNTER — Other Ambulatory Visit: Payer: Self-pay | Admitting: Family Medicine

## 2016-10-28 DIAGNOSIS — I10 Essential (primary) hypertension: Secondary | ICD-10-CM

## 2016-10-28 MED FILL — AMLODIPINE BESYLATE 10 MG T: 10 | 90 days supply | Qty: 90 | Fill #0

## 2016-10-28 MED FILL — VALSARTAN 160 MG TABLET: 160 | 90 days supply | Qty: 90 | Fill #2

## 2016-11-04 ENCOUNTER — Encounter (HOSPITAL_COMMUNITY): Payer: Self-pay

## 2016-11-04 ENCOUNTER — Emergency Department (HOSPITAL_COMMUNITY): Payer: 59

## 2016-11-04 ENCOUNTER — Emergency Department (HOSPITAL_COMMUNITY)
Admission: EM | Admit: 2016-11-04 | Discharge: 2016-11-05 | Disposition: A | Discharge status: Home / Self Care | Payer: 59 | Attending: Physician Assistant | Admitting: Physician Assistant

## 2016-11-04 DIAGNOSIS — R0789 Other chest pain: Secondary | ICD-10-CM | POA: Insufficient documentation

## 2016-11-04 DIAGNOSIS — R079 Chest pain, unspecified: Secondary | ICD-10-CM

## 2016-11-04 DIAGNOSIS — I10 Essential (primary) hypertension: Secondary | ICD-10-CM | POA: Diagnosis not present

## 2016-11-04 LAB — BASIC METABOLIC PANEL
Anion gap: 13 (ref 5–15)
BUN: 12 mg/dL (ref 6–20)
CO2: 22 mmol/L (ref 22–32)
CREATININE: 0.71 mg/dL (ref 0.44–1.00)
Calcium: 9 mg/dL (ref 8.9–10.3)
Chloride: 102 mmol/L (ref 101–111)
GFR calc non Af Amer: >60 mL/min (ref >60)
GLUCOSE: 80 mg/dL (ref 65–99)
Potassium: 3 mmol/L — ABNORMAL LOW (ref 3.5–5.1)
Sodium: 137 mmol/L (ref 135–145)

## 2016-11-04 LAB — CBC
HCT: 37.5 % (ref 36.0–46.0)
Hemoglobin: 12.6 g/dL (ref 12.0–15.0)
MCH: 26.5 pg (ref 26.0–34.0)
MCHC: 33.6 g/dL (ref 30.0–36.0)
MCV: 78.8 fL (ref 78.0–100.0)
PLATELETS: 506 10*3/uL — AB (ref 150–400)
RBC: 4.76 MIL/uL (ref 3.87–5.11)
RDW: 14.1 % (ref 11.5–15.5)
WBC: 10.6 10*3/uL — ABNORMAL HIGH (ref 4.0–10.5)

## 2016-11-04 LAB — I-STAT TROPONIN, ED: TROPONIN I, POC: 0.01 ng/mL (ref 0.00–0.08)

## 2016-11-04 MED ORDER — SODIUM CHLORIDE 0.9 % IV BOLUS (SEPSIS)
1000.0000 mL | Freq: Once | INTRAVENOUS | Status: AC
  Administered 2016-11-04: 1000 mL via INTRAVENOUS

## 2016-11-04 MED ORDER — POTASSIUM CHLORIDE CRYS ER 20 MEQ PO TBCR
40.0000 meq | EXTENDED_RELEASE_TABLET | Freq: Once | ORAL | Status: AC
  Administered 2016-11-04: 40 meq via ORAL
  Filled 2016-11-04: qty 2

## 2016-11-04 MED ORDER — POTASSIUM CHLORIDE ER 10 MEQ PO TBCR: 10.0000 meq | EXTENDED_RELEASE_TABLET | Freq: Every day | ORAL | 0 refills | Status: DC

## 2016-11-04 NOTE — ED Provider Notes (Signed)
[place] DEPT Provider Note   CSN: [id number] Arrival date & time: 11/04/16  2015     History   Chief Complaint Chief Complaint  Patient presents with  . Chest Pain    HPI Megan Mitchell is a 46 y.o. female.  HPI   Patient is a 46 year old female presenting today with chest pain. Patient has occasional chest pains in her left lateral chest wall and into her left shoulder blade. This happens occasionally over the last couple of days. Not associated with exertion.   Not associated with diaphoresis, SOB. Does not radiate to left arm or neck.  Past Medical History:  Diagnosis Date  . Anemia   . Hypertension     Patient Active Problem List   Diagnosis Date Noted  . Anemia 12/26/2014  . HTN (hypertension) 03/22/2014  . Severe obesity (BMI >= 40) ([place]) 03/22/2014    Past Surgical History:  Procedure Laterality Date  . CHOLECYSTECTOMY    . ELBOW SURGERY    . TUBAL LIGATION      OB History    No data available       Home Medications    Prior to Admission medications   Medication Sig Start Date End Date Taking? Authorizing Provider  amLODipine (NORVASC) 10 MG tablet TAKE 1 TABLET BY MOUTH ONCE DAILY 10/28/16  Yes Gay Filler Copland, MD  ferrous fumarate (HEMOCYTE - 106 MG FE) 325 (106 FE) MG TABS Take 1 tablet by mouth 2 (two) times daily.    Yes Historical Provider, MD  hydrochlorothiazide (HYDRODIURIL) 25 MG tablet Take 1 tablet (25 mg total) by mouth daily. 04/27/16  Yes Tereasa Coop, PA-C  ibuprofen (ADVIL,MOTRIN) 200 MG tablet Take 200-800 mg by mouth every 6 (six) hours as needed for moderate pain.   Yes Historical Provider, MD  norethindrone-ethinyl estradiol-iron (LARIN FE 1.5/30) 1.5-30 MG-MCG tablet Take 1 tablet by mouth daily. 04/27/16  Yes Tereasa Coop, PA-C  valsartan (DIOVAN) 160 MG tablet Take 160 mg by mouth daily.   Yes Historical Provider, MD    Family History Family History  Problem Relation Age of Onset  . Hypertension Mother      Social History Social History  Substance Use Topics  . Smoking status: Never Smoker  . Smokeless tobacco: Never Used  . Alcohol use No     Allergies   Sulfa antibiotics   Review of Systems Review of Systems  Constitutional: Negative for fatigue and fever.  Respiratory: Positive for chest tightness. Negative for cough and shortness of breath.   Cardiovascular: Negative for palpitations.  Gastrointestinal: Negative for abdominal pain.  All other systems reviewed and are negative.    Physical Exam Updated Vital Signs BP 97/85   Pulse 61   Temp 98.5 F (36.9 C) (Oral)   Resp 12   Ht 5\' 7"  (1.702 m)   Wt 254 lb (115.2 kg)   LMP 10/11/2016 (Within Days)   SpO2 100%   BMI 39.78 kg/m   Physical Exam  Constitutional: She is oriented to person, place, and time. She appears well-developed and well-nourished.  HENT:  Head: Normocephalic and atraumatic.  Eyes: Right eye exhibits no discharge.  Cardiovascular: Normal rate, regular rhythm and normal heart sounds.   No murmur heard. Pulmonary/Chest: Effort normal and breath sounds normal. She has no wheezes. She has no rales.  Abdominal: Soft. She exhibits no distension. There is no tenderness.  Neurological: She is oriented to person, place, and time.  Skin: Skin is warm and  dry. She is not diaphoretic.  Psychiatric: She has a normal mood and affect.  Nursing note and vitals reviewed.    ED Treatments / Results  Labs (all labs ordered are listed, but only abnormal results are displayed) Labs Reviewed  [facility], ED    EKG  EKG Interpretation  Date/Time:  Friday November 04 2016 20:22:29 EST Ventricular Rate:  64 PR Interval:    QRS Duration: 93 QT Interval:  420 QTC Calculation: 434 R Axis:   -15 Text Interpretation:  Normal sinus rhythm Confirmed by Gerald Leitz ([id number]) on 11/04/2016 11:19:33 PM       Radiology Dg Chest 2 View  Result Date: 11/04/2016 CLINICAL  DATA:  Chest pain EXAM: CHEST  2 VIEW COMPARISON:  January 30, 2012 FINDINGS: Lungs are clear. Heart size and pulmonary vascularity are normal. No adenopathy. No pneumothorax. There is mild degenerative change in the thoracic spine. IMPRESSION: No edema or consolidation. Electronically Signed   By: Lowella Grip III M.D.   On: 11/04/2016 21:12    Procedures Procedures (including critical care time)  Medications Ordered in ED Medications - No data to display   Initial Impression / Assessment and Plan / ED Course  I have reviewed the triage vital signs and the nursing notes.  Pertinent labs & imaging results that were available during my care of the patient were reviewed by me and considered in my medical decision making (see chart for details).  Clinical Course    Patient is a 46 year old female presenting with chest pain. She's had occasional pangs of pain to her left chest and left shoulder. There less than 10 seconds in nature and sharp. Patient has no history of diabetes no hypertension no hyperlipidemia no early primary cardiac death. And is under 31 years old. Patient therefore presents very low risk chest pain.  Initial EKG and troponin are nonischemic. Chest x-ray negative.  We will get a delta troponin.  Patient has chronically low platelets and low potassium, she's not been taking her potassium at home. She reports she does not like the taste liquid. We will put her a prescription for pill potassium and have her follow up this week with her primary care physician.  Final Clinical Impressions(s) / ED Diagnoses   Final diagnoses:  None    New Prescriptions New Prescriptions   No medications on file     Noble Cicalese Julio Alm, MD 11/04/16 2322

## 2016-11-04 NOTE — ED Triage Notes (Signed)
Pt from home with new on set chest pain that intermittently started with in the last week and got worse tonight around 7pm. Pt given 324 ASA and 2 nitros with slight relief of pain

## 2016-11-04 NOTE — ED Notes (Signed)
Pt requested Lab to come draw blood

## 2016-11-04 NOTE — Discharge Instructions (Signed)
We are unsure what is causing  your chest pain. Please follow up with her primary care physician this week. You may need to see a cardiologist.  You still have a low potassium because you are not taking her potassium at home. We'll recommend take your oral potassium for the next several days and follow up and get repeat labs.  His return with any concerning symptoms such as increasing chest pain, shortness of breath or other concerns.

## 2016-11-04 NOTE — ED Notes (Signed)
Patient transported to X-ray 

## 2016-11-05 LAB — I-STAT TROPONIN, ED: Troponin i, poc: 0 ng/mL (ref 0.00–0.08)

## 2016-11-08 MED FILL — POTASSIUM CL 10 MEQ TAB SA: 10 | 10 days supply | Qty: 10 | Fill #0

## 2016-11-25 ENCOUNTER — Ambulatory Visit: Payer: 59

## 2016-12-05 MED FILL — HYDROCHLOROTHIAZIDE 25 MG T: 25 | 90 days supply | Qty: 90 | Fill #1

## 2016-12-10 ENCOUNTER — Ambulatory Visit (INDEPENDENT_AMBULATORY_CARE_PROVIDER_SITE_OTHER): Payer: 59 | Admitting: Physician Assistant

## 2016-12-10 ENCOUNTER — Ambulatory Visit (INDEPENDENT_AMBULATORY_CARE_PROVIDER_SITE_OTHER): Payer: 59

## 2016-12-10 VITALS — BP 138/86 | HR 82 | Temp 98.4°F | Resp 18 | Ht 69.0 in | Wt 284.0 lb

## 2016-12-10 DIAGNOSIS — M545 Low back pain: Secondary | ICD-10-CM | POA: Diagnosis not present

## 2016-12-10 DIAGNOSIS — R292 Abnormal reflex: Secondary | ICD-10-CM | POA: Diagnosis not present

## 2016-12-10 DIAGNOSIS — M47817 Spondylosis without myelopathy or radiculopathy, lumbosacral region: Secondary | ICD-10-CM

## 2016-12-10 DIAGNOSIS — M1288 Other specific arthropathies, not elsewhere classified, other specified site: Secondary | ICD-10-CM

## 2016-12-10 DIAGNOSIS — F411 Generalized anxiety disorder: Secondary | ICD-10-CM

## 2016-12-10 MED ORDER — CYCLOBENZAPRINE HCL 10 MG PO TABS: 5.0000 mg | ORAL_TABLET | Freq: Three times a day (TID) | ORAL | 0 refills | Status: DC | PRN

## 2016-12-10 MED ORDER — MELOXICAM 15 MG PO TABS: 7.5000 mg | ORAL_TABLET | Freq: Every day | ORAL | 0 refills | Status: AC

## 2016-12-10 NOTE — Patient Instructions (Addendum)
Please do not miss doses of prozac.  It will take 6 weeks before you notice significant reductions in daily anxiety.  Cmoe back in three months fo anxiety recheck. Come back in about 2 weeks if you back is still bothering you.   Dg Lumbar Spine Complete  Result Date: 12/10/2016 CLINICAL DATA:  Low back pain no history of injury  NCP EXAM: LUMBAR SPINE - COMPLETE 4+ VIEW COMPARISON:  08/30/2012 FINDINGS: There is no evidence of lumbar spine fracture. Alignment is normal. Intervertebral disc spaces are maintained. Progressive hypertrophic facet DJD L3-S1. Progressive peripheral calcification in the pelvis presumably degenerated fibroids. Cholecystectomy clips. IMPRESSION: 1. Negative for fracture or other acute finding. 2. Progressive lower lumbar facet DJD. 3. Degenerated fibroids Electronically Signed   By: Lucrezia Europe M.D.   On: 12/10/2016 10:15      IF you received an x-ray today, you will receive an invoice from [facility]. Please contact [facility] at [phone] with questions or concerns regarding your invoice.   IF you received labwork today, you will receive an invoice from [facility]. Please contact [facility] at [phone] with questions or concerns regarding your invoice.   Our billing staff will not be able to assist you with questions regarding bills from these companies.  You will be contacted with the lab results as soon as they are available. The fastest way to get your results is to activate your My Chart account. Instructions are located on the last page of this paperwork. If you have not heard from [place] regarding the results in 2 weeks, please contact this office.

## 2016-12-10 NOTE — Progress Notes (Signed)
12/10/2016 10:22 AM   DOB: [birth year] / MRN: [id number]  SUBJECTIVE:  Megan Mitchell is a 46 y.o. female presenting for "piercing" low back pain that started about 6 days ago and she says "i could barely move." She denies weakness and numbness in her legs. She denies dysuria, urgency, frequency and hematuria.  She takes birth control and also has had tubal ligation in 1997 or so.  She has tried Ibuprofen 800 mg once a days.  This helps some.  SHe would like to discuss her anxiety.  I am treating her with prozac 20 (which she has stopped) and hydroxazine, buspar and klonopin.  I have given her celexa in the past and she does not take this either.  She wants to stop the klonopin and celexa and start back the prozac.     Immunization History  Administered Date(s) Administered  . Influenza-Unspecified 07/31/2016   Current Meds  Medication Sig  . amLODipine (NORVASC) 10 MG tablet TAKE 1 TABLET BY MOUTH ONCE DAILY  . ferrous fumarate (HEMOCYTE - 106 MG FE) 325 (106 FE) MG TABS Take 1 tablet by mouth 2 (two) times daily.   Marland Kitchen FLUoxetine (PROZAC) 20 MG capsule Take 20 mg by mouth daily.  . hydrochlorothiazide (HYDRODIURIL) 25 MG tablet Take 1 tablet (25 mg total) by mouth daily.  . hydrOXYzine (ATARAX/VISTARIL) 25 MG tablet Take 25 mg by mouth 3 (three) times daily as needed.  . norethindrone-ethinyl estradiol-iron (LARIN FE 1.5/30) 1.5-30 MG-MCG tablet Take 1 tablet by mouth daily.  . valsartan (DIOVAN) 160 MG tablet Take 160 mg by mouth daily.   She is allergic to sulfa antibiotics.   She  has a past medical history of Anemia and Hypertension.    She  reports that she has never smoked. She has never used smokeless tobacco. She reports that she does not drink alcohol or use drugs. She  reports that she currently engages in sexual activity. She reports using the following method of birth control/protection: Pill. The patient  has a past surgical history that includes Cholecystectomy; Elbow surgery;  and Tubal ligation.  Her family history includes Hypertension in her mother.  Review of Systems  Constitutional: Negative for chills and fever.  Cardiovascular: Negative for chest pain.  Genitourinary: Negative for dysuria, frequency and urgency.  Musculoskeletal: Positive for back pain and myalgias. Negative for falls, joint pain and neck pain.  Neurological: Negative for dizziness.  Psychiatric/Behavioral: The patient is nervous/anxious.     The problem list and medications were reviewed and updated by myself where necessary and exist elsewhere in the encounter.   OBJECTIVE:  BP 138/86   Pulse 82   Temp 98.4 F (36.9 C) (Oral)   Resp 18   Ht 5\' 9"  (1.753 m)   Wt 284 lb (128.8 kg)   LMP 12/06/2016   SpO2 98%   BMI 41.94 kg/m   Lab Results  Component Value Date   TSH 2.865 04/22/2015     Physical Exam  Constitutional: She is oriented to person, place, and time.  Cardiovascular: Normal rate and regular rhythm.   Pulmonary/Chest: Effort normal and breath sounds normal.  Musculoskeletal: Normal range of motion.  Neurological: She is alert and oriented to person, place, and time.  Skin: Skin is warm.  Psychiatric: She has a normal mood and affect. Her behavior is normal. Judgment and thought content normal.    No results found for this or any previous visit (from the past 72 hour(s)).  Dg Lumbar Spine  Complete  Result Date: 12/10/2016 CLINICAL DATA:  Low back pain no history of injury  NCP EXAM: LUMBAR SPINE - COMPLETE 4+ VIEW COMPARISON:  08/30/2012 FINDINGS: There is no evidence of lumbar spine fracture. Alignment is normal. Intervertebral disc spaces are maintained. Progressive hypertrophic facet DJD L3-S1. Progressive peripheral calcification in the pelvis presumably degenerated fibroids. Cholecystectomy clips. IMPRESSION: 1. Negative for fracture or other acute finding. 2. Progressive lower lumbar facet DJD. 3. Degenerated fibroids Electronically Signed   By: Lucrezia Europe M.D.   On: 12/10/2016 10:15    ASSESSMENT AND PLAN:  Tone was seen today for back pain and anxiety.  Diagnoses and all orders for this visit:  Diminished reflexes on examination: See rads.  I would like an orthopedist opinion and have referred.  She may need an MR.  Comments: Odd exam finding and at the patellar reflexes only.  Will follow labs and rads.  Orders: -     Basic metabolic panel -     TSH  Acute low back pain, unspecified back pain laterality, with sciatica presence unspecified -     DG Lumbar Spine Complete; Future -     meloxicam (MOBIC) 15 MG tablet; Take 0.5-1 tablets (7.5-15 mg total) by mouth daily. Take with food. Do not take Ibuprofen, Goody's, or Aleve while taking this medication. -     cyclobenzaprine (FLEXERIL) 10 MG tablet; Take 0.5-1 tablets (5-10 mg total) by mouth 3 (three) times daily as needed for muscle spasms (May cause drowsiness.).  GAD (generalized anxiety disorder): Advised she pick an SSRI and stay with it.  She wants to stay on prozac.  I agree.  She has buspar and hydrox for relief of acute symptoms.  Klonopin disposed of today along with celexa.     The patient is advised to call or return to clinic if she does not see an improvement in symptoms, or to seek the care of the closest emergency department if she worsens with the above plan.   Philis Fendt, MHS, PA-C [facility] 12/10/2016 10:22 AM

## 2016-12-11 LAB — TSH: TSH: 1.29 u[IU]/mL (ref 0.450–4.500)

## 2016-12-11 LAB — BASIC METABOLIC PANEL
BUN / CREAT RATIO: 15 (ref 9–23)
BUN: 11 mg/dL (ref 6–24)
CHLORIDE: 100 mmol/L (ref 96–106)
CO2: 24 mmol/L (ref 18–29)
CREATININE: 0.75 mg/dL (ref 0.57–1.00)
Calcium: 9.6 mg/dL (ref 8.7–10.2)
GFR calc Af Amer: 110 mL/min/{1.73_m2} (ref >59)
GFR calc non Af Amer: 95 mL/min/{1.73_m2} (ref >59)
GLUCOSE: 91 mg/dL (ref 65–99)
Potassium: 4 mmol/L (ref 3.5–5.2)
SODIUM: 142 mmol/L (ref 134–144)

## 2016-12-12 MED FILL — MELOXICAM 15 MG TABLET: 15 | 30 days supply | Qty: 30 | Fill #0

## 2016-12-12 MED FILL — CYCLOBENZAPRINE 10 MG TAB: 10 | 10 days supply | Qty: 30 | Fill #0

## 2016-12-17 ENCOUNTER — Telehealth: Payer: Self-pay

## 2016-12-17 NOTE — Telephone Encounter (Signed)
Patient would like to have someone give her a call about her lab work from 12/10/16 her call back number is [phone]

## 2016-12-17 NOTE — Telephone Encounter (Signed)
I advised the labs look normal, will keep her referral appt for ortho

## 2017-01-03 MED FILL — LARIN FE 1.5-30 TABLET: 1.5-30 | 84 days supply | Qty: 84 | Fill #3

## 2017-01-30 MED FILL — VALSARTAN 160 MG TABLET: 160 | 90 days supply | Qty: 90 | Fill #3

## 2017-01-30 MED FILL — AMLODIPINE BESYLATE 10 MG T: 10 | 90 days supply | Qty: 90 | Fill #1

## 2017-03-01 ENCOUNTER — Other Ambulatory Visit: Payer: Self-pay | Admitting: Physician Assistant

## 2017-03-01 DIAGNOSIS — Z3040 Encounter for surveillance of contraceptives, unspecified: Secondary | ICD-10-CM

## 2017-03-02 NOTE — Telephone Encounter (Signed)
10/2016 last lab 12/2016 last ov

## 2017-03-10 MED FILL — HYDROCHLOROTHIAZIDE 25 MG T: 25 | 90 days supply | Qty: 90 | Fill #2

## 2017-03-14 MED FILL — LARIN FE 1.5-30 TABLET: 1.5-30 | 84 days supply | Qty: 84 | Fill #0

## 2017-03-20 IMAGING — DX DG LUMBAR SPINE COMPLETE 4+V
5 series · 5 of 5 positions shown · non-contrast
Comparison: 08/30/2012

CLINICAL DATA: Low back pain no history of injury  NCP

EXAM:
LUMBAR SPINE - COMPLETE 4+ VIEW

[l-spine ap]
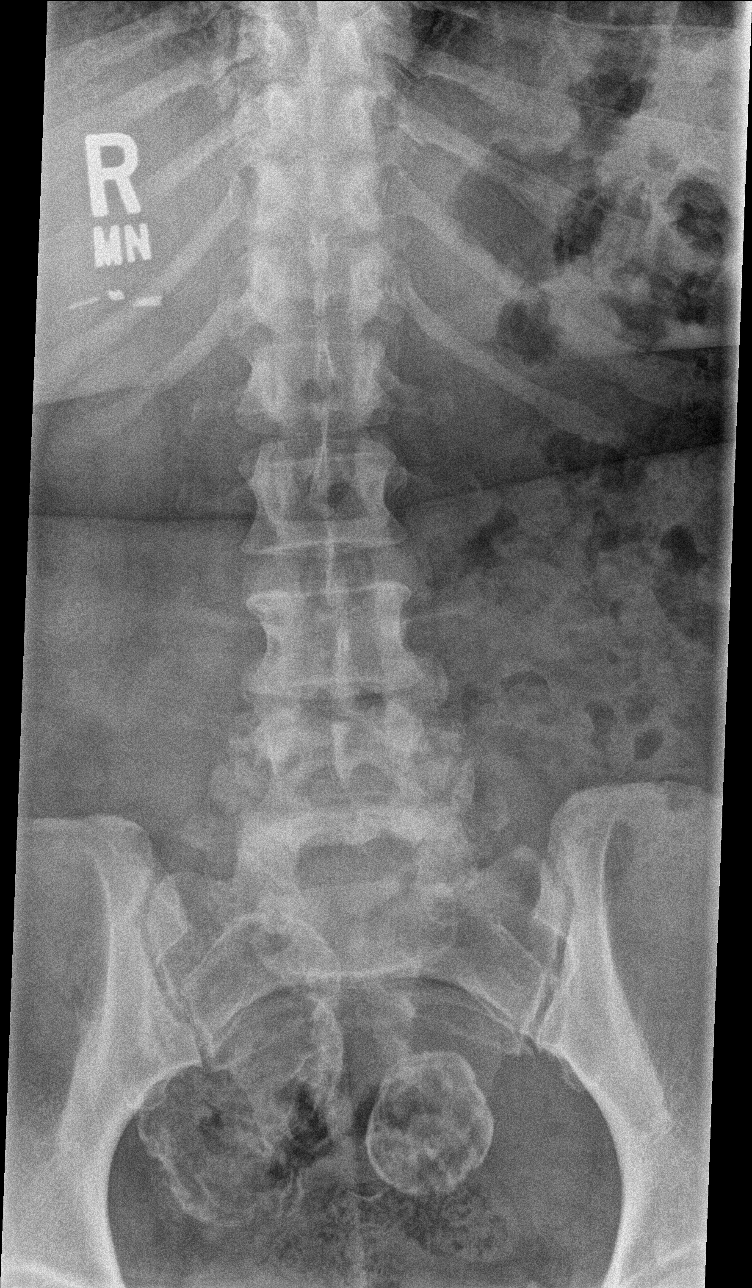

[l-spine obl (1 of 2)]
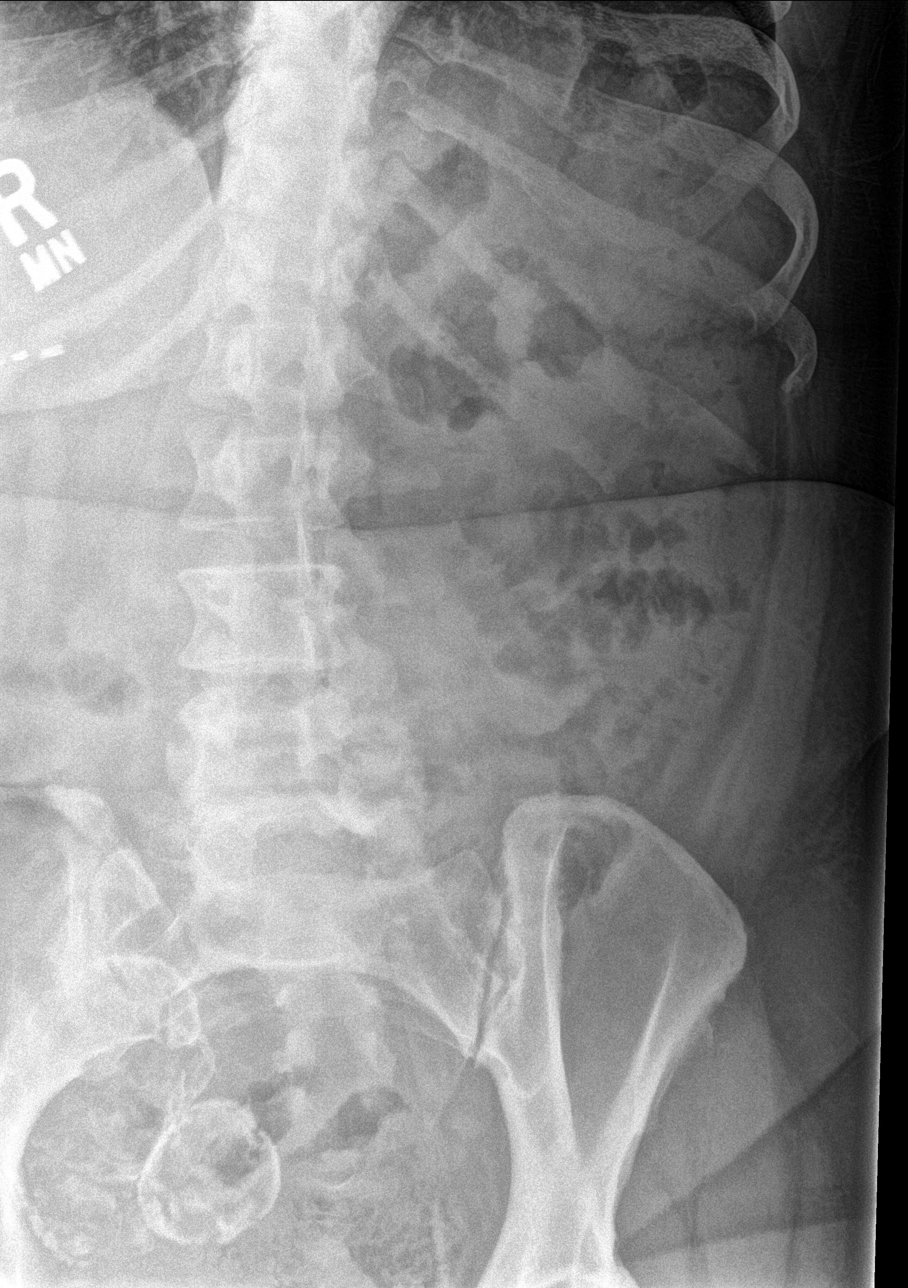

[l-spine obl (2 of 2)]
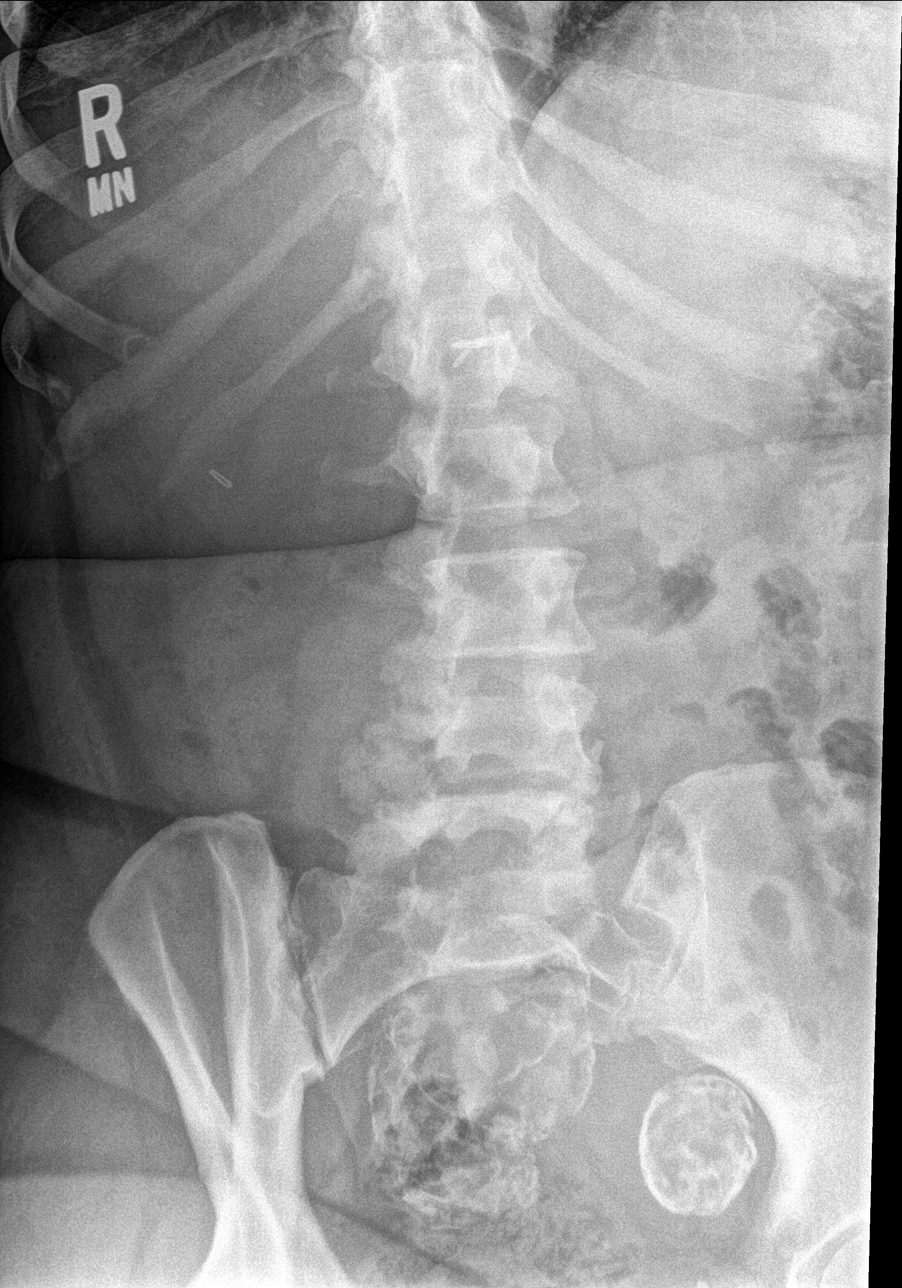

[l-spine lat]
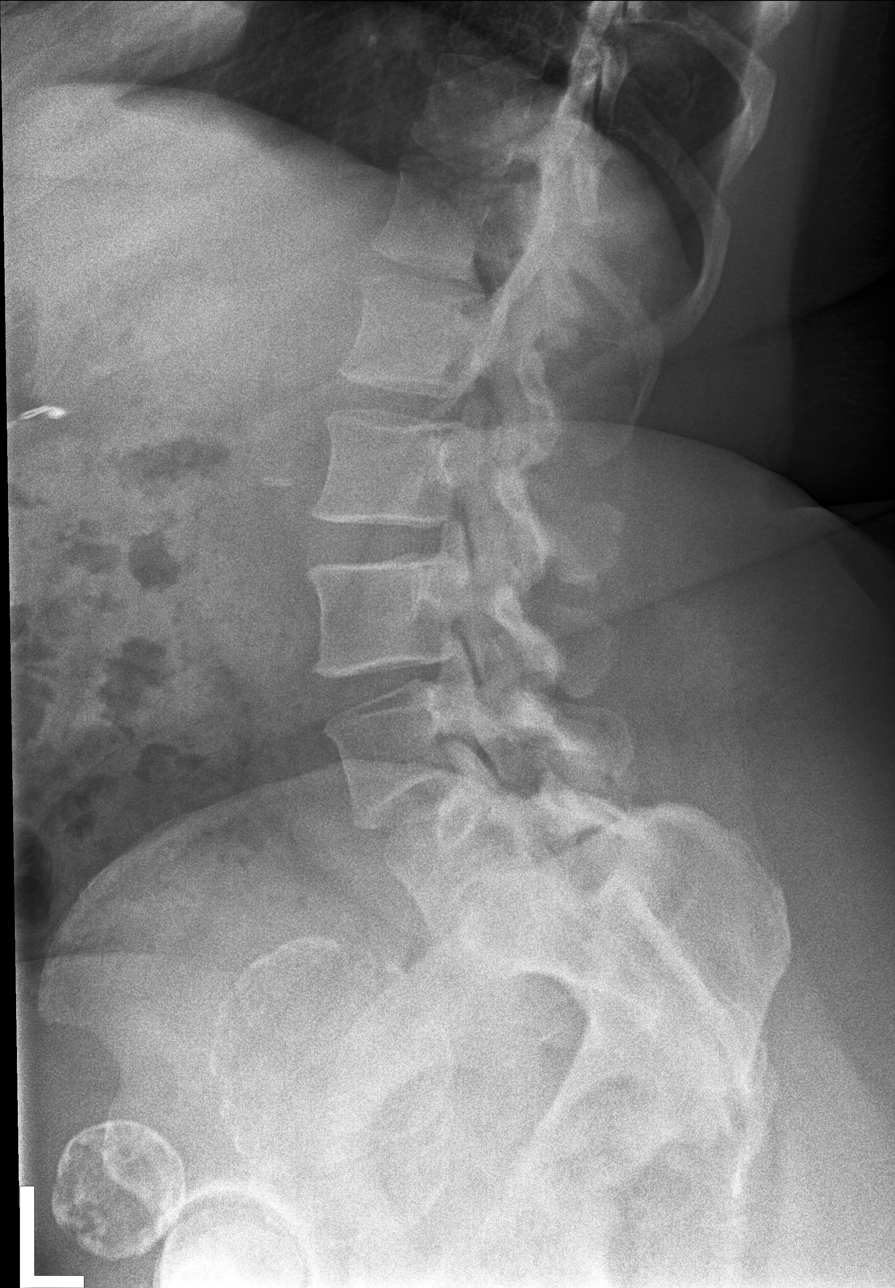

[l-spine l5-s1]
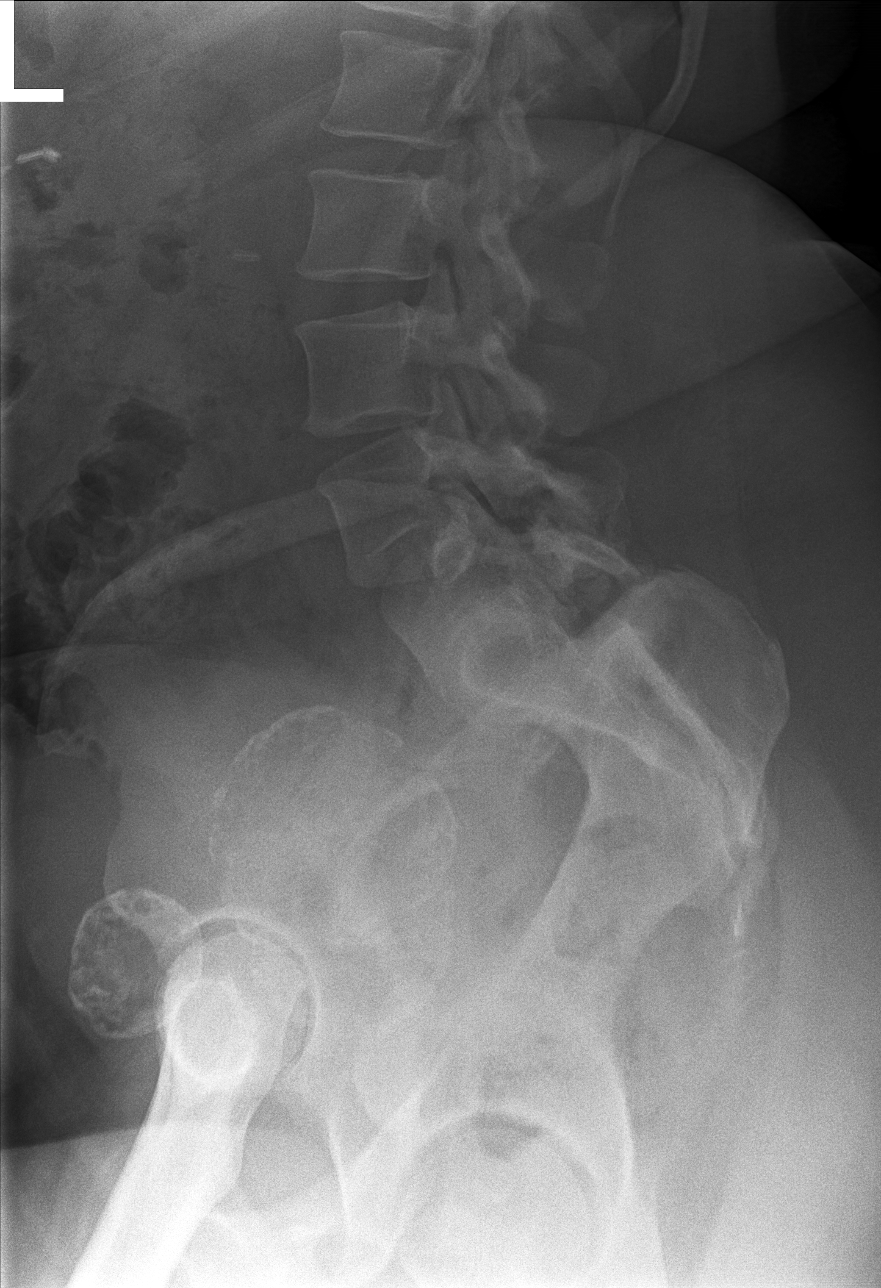

[5 of 5 positions shown; findings below may reference images not displayed]

FINDINGS: There is no evidence of lumbar spine fracture. Alignment is normal.
Intervertebral disc spaces are maintained. Progressive hypertrophic
facet DJD L3-S1. Progressive peripheral calcification in the pelvis
presumably degenerated fibroids. Cholecystectomy clips.
IMPRESSION: 1. Negative for fracture or other acute finding.
2. Progressive lower lumbar facet DJD.
3. Degenerated fibroids

## 2017-04-06 ENCOUNTER — Ambulatory Visit: Payer: 59 | Admitting: Physician Assistant

## 2017-04-28 ENCOUNTER — Other Ambulatory Visit: Payer: Self-pay | Admitting: Physician Assistant

## 2017-04-28 DIAGNOSIS — I10 Essential (primary) hypertension: Secondary | ICD-10-CM

## 2017-04-28 NOTE — Telephone Encounter (Signed)
Please review and advise, thank you/ S.Ulis Kaps,CMA

## 2017-05-05 MED FILL — VALSARTAN 160 MG TABLET: 160 | 90 days supply | Qty: 90 | Fill #0

## 2017-05-05 MED FILL — AMLODIPINE BESYLATE 10 MG T: 10 | 90 days supply | Qty: 90 | Fill #2

## 2017-06-07 ENCOUNTER — Other Ambulatory Visit: Payer: Self-pay | Admitting: Physician Assistant

## 2017-06-07 DIAGNOSIS — I10 Essential (primary) hypertension: Secondary | ICD-10-CM

## 2017-06-07 MED FILL — HYDROCHLOROTHIAZIDE 25 MG T: 25 | 90 days supply | Qty: 90 | Fill #0

## 2017-06-07 MED FILL — LARIN FE 1.5-30 TABLET: 1.5-30 | 84 days supply | Qty: 84 | Fill #1

## 2017-07-27 DIAGNOSIS — M9905 Segmental and somatic dysfunction of pelvic region: Secondary | ICD-10-CM | POA: Diagnosis not present

## 2017-07-27 DIAGNOSIS — M791 Myalgia: Secondary | ICD-10-CM | POA: Diagnosis not present

## 2017-07-27 DIAGNOSIS — M9903 Segmental and somatic dysfunction of lumbar region: Secondary | ICD-10-CM | POA: Diagnosis not present

## 2017-07-27 DIAGNOSIS — M545 Low back pain: Secondary | ICD-10-CM | POA: Diagnosis not present

## 2017-07-27 DIAGNOSIS — M9904 Segmental and somatic dysfunction of sacral region: Secondary | ICD-10-CM | POA: Diagnosis not present

## 2017-07-27 DIAGNOSIS — M5442 Lumbago with sciatica, left side: Secondary | ICD-10-CM | POA: Diagnosis not present

## 2017-07-31 MED FILL — VALSARTAN 160 MG TABLET: 160 | 30 days supply | Qty: 30 | Fill #1

## 2017-07-31 MED FILL — AMLODIPINE BESYLATE 10 MG T: 10 | 90 days supply | Qty: 90 | Fill #3

## 2017-08-07 DIAGNOSIS — M9905 Segmental and somatic dysfunction of pelvic region: Secondary | ICD-10-CM | POA: Diagnosis not present

## 2017-08-07 DIAGNOSIS — M9904 Segmental and somatic dysfunction of sacral region: Secondary | ICD-10-CM | POA: Diagnosis not present

## 2017-08-07 DIAGNOSIS — M7918 Myalgia, other site: Secondary | ICD-10-CM | POA: Diagnosis not present

## 2017-08-07 DIAGNOSIS — M9903 Segmental and somatic dysfunction of lumbar region: Secondary | ICD-10-CM | POA: Diagnosis not present

## 2017-08-07 DIAGNOSIS — M5442 Lumbago with sciatica, left side: Secondary | ICD-10-CM | POA: Diagnosis not present

## 2017-08-07 DIAGNOSIS — M545 Low back pain: Secondary | ICD-10-CM | POA: Diagnosis not present

## 2017-08-14 DIAGNOSIS — M9905 Segmental and somatic dysfunction of pelvic region: Secondary | ICD-10-CM | POA: Diagnosis not present

## 2017-08-14 DIAGNOSIS — M7918 Myalgia, other site: Secondary | ICD-10-CM | POA: Diagnosis not present

## 2017-08-14 DIAGNOSIS — M5442 Lumbago with sciatica, left side: Secondary | ICD-10-CM | POA: Diagnosis not present

## 2017-08-14 DIAGNOSIS — M9904 Segmental and somatic dysfunction of sacral region: Secondary | ICD-10-CM | POA: Diagnosis not present

## 2017-08-14 DIAGNOSIS — M545 Low back pain: Secondary | ICD-10-CM | POA: Diagnosis not present

## 2017-08-14 DIAGNOSIS — M9903 Segmental and somatic dysfunction of lumbar region: Secondary | ICD-10-CM | POA: Diagnosis not present

## 2017-08-17 ENCOUNTER — Ambulatory Visit: Payer: 59 | Admitting: Physician Assistant

## 2017-08-21 ENCOUNTER — Ambulatory Visit (INDEPENDENT_AMBULATORY_CARE_PROVIDER_SITE_OTHER): Payer: 59

## 2017-08-21 ENCOUNTER — Encounter: Payer: Self-pay | Admitting: Physician Assistant

## 2017-08-21 ENCOUNTER — Ambulatory Visit (INDEPENDENT_AMBULATORY_CARE_PROVIDER_SITE_OTHER): Payer: 59 | Admitting: Physician Assistant

## 2017-08-21 VITALS — BP 126/88 | HR 84 | Temp 98.6°F | Resp 16 | Ht 69.0 in | Wt 290.2 lb

## 2017-08-21 DIAGNOSIS — M179 Osteoarthritis of knee, unspecified: Secondary | ICD-10-CM | POA: Diagnosis not present

## 2017-08-21 DIAGNOSIS — Z3041 Encounter for surveillance of contraceptive pills: Secondary | ICD-10-CM

## 2017-08-21 DIAGNOSIS — R718 Other abnormality of red blood cells: Secondary | ICD-10-CM

## 2017-08-21 DIAGNOSIS — M25562 Pain in left knee: Secondary | ICD-10-CM | POA: Diagnosis not present

## 2017-08-21 DIAGNOSIS — I10 Essential (primary) hypertension: Secondary | ICD-10-CM

## 2017-08-21 MED ORDER — NORETHIN ACE-ETH ESTRAD-FE 1.5-30 MG-MCG PO TABS: 1.0000 | ORAL_TABLET | Freq: Every day | ORAL | 5 refills | Status: DC

## 2017-08-21 MED ORDER — VALSARTAN 160 MG PO TABS: 160.0000 mg | ORAL_TABLET | Freq: Every day | ORAL | 3 refills | Status: DC

## 2017-08-21 MED ORDER — HYDROCHLOROTHIAZIDE 25 MG PO TABS: ORAL_TABLET | ORAL | 0 refills | Status: DC

## 2017-08-21 MED ORDER — AMLODIPINE BESYLATE 10 MG PO TABS: 10.0000 mg | ORAL_TABLET | Freq: Every day | ORAL | 3 refills | Status: DC

## 2017-08-21 MED ORDER — DICLOFENAC SODIUM 1 % TD GEL: 4.0000 g | Freq: Two times a day (BID) | TRANSDERMAL | 1 refills | Status: DC

## 2017-08-21 NOTE — Patient Instructions (Signed)
     IF you received an x-ray today, you will receive an invoice from [facility]. Please contact [facility] at [phone] with questions or concerns regarding your invoice.   IF you received labwork today, you will receive an invoice from [facility]. Please contact [facility] at [phone] with questions or concerns regarding your invoice.   Our billing staff will not be able to assist you with questions regarding bills from these companies.  You will be contacted with the lab results as soon as they are available. The fastest way to get your results is to activate your My Chart account. Instructions are located on the last page of this paperwork. If you have not heard from us regarding the results in 2 weeks, please contact this office.     

## 2017-08-21 NOTE — Progress Notes (Signed)
08/23/2017 9:15 AM   DOB: [birth year] / MRN: [id number]  SUBJECTIVE:  Megan Mitchell is a 46 y.o. female presenting for left knee pain that started 2 weeks ago. Pain is worsened by ambulating and getting up. Pain is better with rest. No known injury and she has never had problems with the left knew.  She has tried ibuprofen 800 and will do this once every 4-5 days. Ice has also been somewhat helpful.   She would like refills of her BP medication today.   Most recent menstrual cycle was about 3 days and was off 2 days later. Last pap smear was about 1 year ago. She had a tubal ligation 23 years ago.      She is allergic to sulfa antibiotics.   She  has a past medical history of Anemia and Hypertension.    She  reports that she has never smoked. She has never used smokeless tobacco. She reports that she does not drink alcohol or use drugs. She  reports that she currently engages in sexual activity. She reports using the following method of birth control/protection: Pill. The patient  has a past surgical history that includes Cholecystectomy; Elbow surgery; and Tubal ligation.  Her family history includes Hypertension in her mother.  Review of Systems  Constitutional: Negative for chills, diaphoresis and fever.  Eyes: Negative.   Respiratory: Negative for shortness of breath.   Cardiovascular: Negative for chest pain, orthopnea and leg swelling.  Gastrointestinal: Negative for abdominal pain, blood in stool, constipation, diarrhea, heartburn, melena, nausea and vomiting.  Genitourinary: Negative for flank pain.  Skin: Negative for rash.  Neurological: Negative for dizziness, sensory change, speech change, focal weakness and headaches.    The problem list and medications were reviewed and updated by myself where necessary and exist elsewhere in the encounter.   OBJECTIVE:  BP 126/88 (BP Location: Right Arm, Patient Position: Sitting, Cuff Size: Large)   Pulse 84   Temp 98.6 F (37  C) (Oral)   Resp 16   Ht 5\' 9"  (1.753 m)   Wt 290 lb 3.2 oz (131.6 kg)   LMP 08/16/2017   SpO2 97%   BMI 42.86 kg/m     Physical Exam  Constitutional: She is oriented to person, place, and time. She is active.  Non-toxic appearance.  Cardiovascular: Normal rate.   Pulmonary/Chest: Effort normal. No tachypnea.  Musculoskeletal: Normal range of motion. She exhibits no edema, tenderness or deformity.  Right knee exam WNL.  Neurological: She is alert and oriented to person, place, and time. She displays normal reflexes. No cranial nerve deficit. She exhibits normal muscle tone. Coordination normal.  Skin: Skin is warm and dry. She is not diaphoretic. No pallor.    Results for orders placed or performed in visit on 08/21/17 (from the past 72 hour(s))  CBC     Status: Abnormal   Collection Time: 08/21/17  6:03 PM  Result Value Ref Range   WBC 10.8 3.4 - 10.8 x10E3/uL   RBC 4.71 3.77 - 5.28 x10E6/uL   Hemoglobin 12.3 11.1 - 15.9 g/dL   Hematocrit 37.7 34.0 - 46.6 %   MCV 80 79 - 97 fL   MCH 26.1 (L) 26.6 - 33.0 pg   MCHC 32.6 31.5 - 35.7 g/dL   RDW 14.0 12.3 - 15.4 %   Platelets 589 (H) 150 - 379 x10E3/uL  TSH     Status: None   Collection Time: 08/21/17  6:03 PM  Result Value  Ref Range   TSH 1.470 0.450 - 4.500 uIU/mL  Basic metabolic panel     Status: Abnormal   Collection Time: 08/21/17  6:03 PM  Result Value Ref Range   Glucose 83 65 - 99 mg/dL   BUN 11 6 - 24 mg/dL   Creatinine, Ser 0.81 0.57 - 1.00 mg/dL   GFR calc non Af Amer 86 >59 mL/min/1.73   GFR calc Af Amer 99 >59 mL/min/1.73   BUN/Creatinine Ratio 14 9 - 23   Sodium 141 134 - 144 mmol/L   Potassium 3.5 3.5 - 5.2 mmol/L   Chloride 96 96 - 106 mmol/L   CO2 28 20 - 29 mmol/L   Calcium 10.3 (H) 8.7 - 10.2 mg/dL  Hepatic function panel     Status: None   Collection Time: 08/21/17  6:03 PM  Result Value Ref Range   Total Protein 7.4 6.0 - 8.5 g/dL   Albumin 3.9 3.5 - 5.5 g/dL   Bilirubin Total 0.4 0.0 - 1.2  mg/dL   Bilirubin, Direct 0.11 0.00 - 0.40 mg/dL   Alkaline Phosphatase 65 39 - 117 IU/L   AST 12 0 - 40 IU/L   ALT 14 0 - 32 IU/L    No results found.  ASSESSMENT AND PLAN:  Megan Mitchell was seen today for knee pain and medication refill.  Diagnoses and all orders for this visit:  Acute pain of left knee: OA.  Will start with gel and advised that she take tylenol 1000 mg every 8.   -     DG Knee Complete 4 Views Left; Future -     diclofenac sodium (VOLTAREN) 1 % GEL; Apply 4 g topically 2 (two) times daily.  Essential hypertension -     valsartan (DIOVAN) 160 MG tablet; Take 1 tablet (160 mg total) by mouth daily. -     hydrochlorothiazide (HYDRODIURIL) 25 MG tablet; TAKE 1 TABLET BY MOUTH ONCE DAILY. -     amLODipine (NORVASC) 10 MG tablet; Take 1 tablet (10 mg total) by mouth daily. -     CBC -     TSH -     Basic metabolic panel -     Hepatic function panel  Encounter for surveillance of contraceptive pills -     norethindrone-ethinyl estradiol-iron (LARIN FE 1.5/30) 1.5-30 MG-MCG tablet; Take 1 tablet by mouth daily.    The patient is advised to call or return to clinic if she does not see an improvement in symptoms, or to seek the care of the closest emergency department if she worsens with the above plan.   Philis Fendt, MHS, PA-C [facility] 08/23/2017 9:15 AM

## 2017-08-22 DIAGNOSIS — M9904 Segmental and somatic dysfunction of sacral region: Secondary | ICD-10-CM | POA: Diagnosis not present

## 2017-08-22 DIAGNOSIS — M9903 Segmental and somatic dysfunction of lumbar region: Secondary | ICD-10-CM | POA: Diagnosis not present

## 2017-08-22 DIAGNOSIS — M7918 Myalgia, other site: Secondary | ICD-10-CM | POA: Diagnosis not present

## 2017-08-22 DIAGNOSIS — M5442 Lumbago with sciatica, left side: Secondary | ICD-10-CM | POA: Diagnosis not present

## 2017-08-22 DIAGNOSIS — M9905 Segmental and somatic dysfunction of pelvic region: Secondary | ICD-10-CM | POA: Diagnosis not present

## 2017-08-22 DIAGNOSIS — M545 Low back pain: Secondary | ICD-10-CM | POA: Diagnosis not present

## 2017-08-22 LAB — HEPATIC FUNCTION PANEL
ALK PHOS: 65 IU/L (ref 39–117)
ALT: 14 IU/L (ref 0–32)
AST: 12 IU/L (ref 0–40)
Albumin: 3.9 g/dL (ref 3.5–5.5)
BILIRUBIN TOTAL: 0.4 mg/dL (ref 0.0–1.2)
BILIRUBIN, DIRECT: 0.11 mg/dL (ref 0.00–0.40)
Total Protein: 7.4 g/dL (ref 6.0–8.5)

## 2017-08-22 LAB — BASIC METABOLIC PANEL
BUN/Creatinine Ratio: 14 (ref 9–23)
BUN: 11 mg/dL (ref 6–24)
CALCIUM: 10.3 mg/dL — AB (ref 8.7–10.2)
CO2: 28 mmol/L (ref 20–29)
CREATININE: 0.81 mg/dL (ref 0.57–1.00)
Chloride: 96 mmol/L (ref 96–106)
GFR, EST AFRICAN AMERICAN: 99 mL/min/{1.73_m2} (ref >59)
GFR, EST NON AFRICAN AMERICAN: 86 mL/min/{1.73_m2} (ref >59)
Glucose: 83 mg/dL (ref 65–99)
POTASSIUM: 3.5 mmol/L (ref 3.5–5.2)
Sodium: 141 mmol/L (ref 134–144)

## 2017-08-22 LAB — CBC
HEMATOCRIT: 37.7 % (ref 34.0–46.6)
Hemoglobin: 12.3 g/dL (ref 11.1–15.9)
MCH: 26.1 pg — AB (ref 26.6–33.0)
MCHC: 32.6 g/dL (ref 31.5–35.7)
MCV: 80 fL (ref 79–97)
PLATELETS: 589 10*3/uL — AB (ref 150–379)
RBC: 4.71 x10E6/uL (ref 3.77–5.28)
RDW: 14 % (ref 12.3–15.4)
WBC: 10.8 10*3/uL (ref 3.4–10.8)

## 2017-08-22 LAB — TSH: TSH: 1.47 u[IU]/mL (ref 0.450–4.500)

## 2017-08-22 MED FILL — DICLOFENAC SODIUM 1% GEL: 1 | 25 days supply | Qty: 200 | Fill #0

## 2017-08-22 MED FILL — HYDROCHLOROTHIAZIDE 25 MG T: 25 | 90 days supply | Qty: 90 | Fill #0

## 2017-08-22 MED FILL — LARIN FE 1.5-30 TABLET: 1.5-30 | 28 days supply | Qty: 28 | Fill #0

## 2017-08-28 DIAGNOSIS — R718 Other abnormality of red blood cells: Secondary | ICD-10-CM | POA: Diagnosis not present

## 2017-08-28 NOTE — Addendum Note (Signed)
Addended by: Gari Crown D on: 08/28/2017 11:40 AM   Modules accepted: Orders

## 2017-08-29 ENCOUNTER — Telehealth: Payer: Self-pay | Admitting: Physician Assistant

## 2017-08-29 LAB — IRON,TIBC AND FERRITIN PANEL
FERRITIN: 81 ng/mL (ref 15–150)
IRON SATURATION: 17 % (ref 15–55)
IRON: 66 ug/dL (ref 27–159)
TIBC: 398 ug/dL (ref 250–450)
UIBC: 332 ug/dL (ref 131–425)

## 2017-08-29 NOTE — Telephone Encounter (Signed)
Pt states it has been over a week and no lab results yet   Best number [phone] til [phone]

## 2017-08-30 NOTE — Telephone Encounter (Signed)
Patient calling back about labs that's been over a week pt is upset that know one haven't called her

## 2017-08-30 NOTE — Telephone Encounter (Signed)
Sent to Le[facility] Como to advise.  Some labs added on. Not released yet.

## 2017-08-30 NOTE — Telephone Encounter (Signed)
Her labs look great. Blood counts, iron, kidneys and liver all WNL.   Please call and advise that all the labs are fine and no medication changes are required.

## 2017-08-30 NOTE — Telephone Encounter (Signed)
IC pt with Michael's lab message.  She was happy to get results.  Asked about her potassium - advised in normal limits.  Can eat bananas and baked potatoes for potassium

## 2017-09-01 MED FILL — VALSARTAN 160 MG TABS: 160 | 30 days supply | Qty: 30 | Fill #2

## 2017-09-27 MED FILL — VALSARTAN 160 MG TABS: 160 | 30 days supply | Qty: 30 | Fill #3

## 2017-09-27 MED FILL — LARIN FE 1.5-30 TABLET: 1.5-30 | 84 days supply | Qty: 84 | Fill #1

## 2017-10-20 ENCOUNTER — Ambulatory Visit: Payer: 59 | Admitting: Physician Assistant

## 2017-10-25 MED FILL — VALSARTAN 160 MG TABLET: 160 | 30 days supply | Qty: 30 | Fill #4

## 2017-11-09 ENCOUNTER — Other Ambulatory Visit: Payer: Self-pay | Admitting: Physician Assistant

## 2017-11-09 DIAGNOSIS — I10 Essential (primary) hypertension: Secondary | ICD-10-CM

## 2017-11-09 MED FILL — AMLODIPINE BESYLATE 10 MG T: 10 | 90 days supply | Qty: 90 | Fill #0

## 2017-11-10 MED FILL — HYDROCHLOROTHIAZIDE 25 MG T: 25 | 30 days supply | Qty: 30 | Fill #0

## 2017-11-30 ENCOUNTER — Other Ambulatory Visit: Payer: Self-pay | Admitting: Physician Assistant

## 2017-11-30 DIAGNOSIS — I10 Essential (primary) hypertension: Secondary | ICD-10-CM

## 2017-11-30 MED FILL — VALSARTAN 160 MG TABLET: 160 | 90 days supply | Qty: 90 | Fill #5

## 2017-11-30 NOTE — Telephone Encounter (Signed)
Attempted to contact pt regarding refill request for hydrodiuril; pt needs office visit before refill can be granted; left message on voice mail [phone].

## 2017-12-05 ENCOUNTER — Other Ambulatory Visit: Payer: Self-pay | Admitting: Physician Assistant

## 2017-12-05 DIAGNOSIS — I10 Essential (primary) hypertension: Secondary | ICD-10-CM

## 2018-01-01 ENCOUNTER — Other Ambulatory Visit: Payer: Self-pay | Admitting: Physician Assistant

## 2018-01-01 DIAGNOSIS — Z3041 Encounter for surveillance of contraceptive pills: Secondary | ICD-10-CM

## 2018-01-01 MED FILL — LARIN FE 1.5-30 TABLET: 1.5-30 | 56 days supply | Qty: 56 | Fill #2

## 2018-01-05 ENCOUNTER — Other Ambulatory Visit: Payer: Self-pay

## 2018-01-05 ENCOUNTER — Encounter: Payer: Self-pay | Admitting: Physician Assistant

## 2018-01-05 ENCOUNTER — Ambulatory Visit: Payer: 59 | Admitting: Physician Assistant

## 2018-01-05 VITALS — BP 125/80 | HR 86 | Temp 98.7°F | Resp 18 | Ht 69.0 in | Wt 294.8 lb

## 2018-01-05 DIAGNOSIS — Z3041 Encounter for surveillance of contraceptive pills: Secondary | ICD-10-CM

## 2018-01-05 DIAGNOSIS — I10 Essential (primary) hypertension: Secondary | ICD-10-CM | POA: Diagnosis not present

## 2018-01-05 DIAGNOSIS — J069 Acute upper respiratory infection, unspecified: Secondary | ICD-10-CM

## 2018-01-05 MED ORDER — CETIRIZINE-PSEUDOEPHEDRINE ER 5-120 MG PO TB12: 1.0000 | ORAL_TABLET | Freq: Two times a day (BID) | ORAL | 0 refills | Status: AC

## 2018-01-05 MED ORDER — HYDROCHLOROTHIAZIDE 25 MG PO TABS: ORAL_TABLET | ORAL | 3 refills | Status: DC

## 2018-01-05 MED ORDER — NORETHIN ACE-ETH ESTRAD-FE 1.5-30 MG-MCG PO TABS: 1.0000 | ORAL_TABLET | Freq: Every day | ORAL | 11 refills | Status: DC

## 2018-01-05 MED FILL — HYDROCHLOROTHIAZIDE 25 MG T: 25 | 90 days supply | Qty: 90 | Fill #0

## 2018-01-05 NOTE — Patient Instructions (Signed)
     IF you received an x-ray today, you will receive an invoice from [facility]. Please contact [facility] at [phone] with questions or concerns regarding your invoice.   IF you received labwork today, you will receive an invoice from [facility]. Please contact [facility] at [phone] with questions or concerns regarding your invoice.   Our billing staff will not be able to assist you with questions regarding bills from these companies.  You will be contacted with the lab results as soon as they are available. The fastest way to get your results is to activate your My Chart account. Instructions are located on the last page of this paperwork. If you have not heard from us regarding the results in 2 weeks, please contact this office.     

## 2018-01-05 NOTE — Progress Notes (Signed)
01/07/2018 12:07 PM   DOB: [birth year] / MRN: [id number]  SUBJECTIVE:  Megan Mitchell is a 47 y.o. female presenting for sinus congestion and dry cough for three days.  She is worsening.  Husband has also been sick at home and has been starting to feel better.  Taking OTC cold and cough with some relief.   She would like her HCTZ refilled today.  Was seen recently and tells me the HCTZ was the only medication that was not written for long term.   She would like her OCP refilled.  Still having periods which are coming regularly however they are shorter and she has less bleeding.  She wonders if she is about to go through "the change."   She is allergic to sulfa antibiotics.   She  has a past medical history of Anemia and Hypertension.    She  reports that  has never smoked. she has never used smokeless tobacco. She reports that she does not drink alcohol or use drugs. She  reports that she currently engages in sexual activity. She reports using the following method of birth control/protection: Pill. The patient  has a past surgical history that includes Cholecystectomy; Elbow surgery; and Tubal ligation.  Her family history includes Hypertension in her mother.  ROS  Negative for hemoptysis, leg swelling, shortness of breath, DOE.  The problem list and medications were reviewed and updated by myself where necessary and exist elsewhere in the encounter.   OBJECTIVE:  BP 125/80 (BP Location: Right Arm, Patient Position: Sitting, Cuff Size: Large)   Pulse 86   Temp 98.7 F (37.1 C) (Oral)   Resp 18   Ht 5\' 9"  (1.753 m)   Wt 294 lb 12.8 oz (133.7 kg)   LMP 01/02/2018   SpO2 98%   BMI 43.53 kg/m   Lab Results  Component Value Date   CREATININE 0.81 08/21/2017   BUN 11 08/21/2017   NA 141 08/21/2017   K 3.5 08/21/2017   CL 96 08/21/2017   CO2 28 08/21/2017   Physical Exam  Constitutional: She is oriented to person, place, and time. She is active.  Non-toxic appearance.    Eyes: EOM are normal. Pupils are equal, round, and reactive to light.  Cardiovascular: Normal rate, regular rhythm, S1 normal, S2 normal, normal heart sounds and intact distal pulses. Exam reveals no gallop, no friction rub and no decreased pulses.  No murmur heard. Pulmonary/Chest: Effort normal. No stridor. No tachypnea. No respiratory distress. She has no wheezes. She has no rales.  Abdominal: She exhibits no distension.  Musculoskeletal: She exhibits no edema.  Neurological: She is alert and oriented to person, place, and time. She has normal strength and normal reflexes. She is not disoriented. She displays no atrophy. No cranial nerve deficit or sensory deficit. She exhibits normal muscle tone. Coordination and gait normal.  Skin: Skin is warm and dry. She is not diaphoretic. No pallor.  Psychiatric: Her behavior is normal.    No results found for this or any previous visit (from the past 72 hour(s)).  No results found.  ASSESSMENT AND PLAN:  Shaquanda was seen today for sinus problem and medication refill.  Diagnoses and all orders for this visit:  Acute URI -     cetirizine-pseudoephedrine (ZYRTEC-D) 5-120 MG tablet; Take 1 tablet by mouth 2 (two) times daily for 7 days.  Encounter for surveillance of contraceptive pills -     norethindrone-ethinyl estradiol-iron (LARIN FE 1.5/30) 1.5-30 MG-MCG tablet;  Take 1 tablet by mouth daily.  Essential hypertension -     hydrochlorothiazide (HYDRODIURIL) 25 MG tablet; TAKE 1 TABLET BY MOUTH ONCE DAILY.    The patient is advised to call or return to clinic if she does not see an improvement in symptoms, or to seek the care of the closest emergency department if she worsens with the above plan.   Philis Fendt, MHS, PA-C [facility] 01/07/2018 12:07 PM

## 2018-01-07 ENCOUNTER — Encounter: Payer: Self-pay | Admitting: Physician Assistant

## 2018-02-08 MED FILL — AMLODIPINE BESYLATE 10 MG T: 10 | 90 days supply | Qty: 90 | Fill #1

## 2018-02-21 MED FILL — LARIN FE 1.5-30 TABLET: 1.5-30 | 84 days supply | Qty: 84 | Fill #0

## 2018-02-26 MED FILL — VALSARTAN 160 MG TABLET: 160 | 60 days supply | Qty: 60 | Fill #6

## 2018-04-10 MED FILL — HYDROCHLOROTHIAZIDE 25 MG T: 25 | 90 days supply | Qty: 90 | Fill #1

## 2018-04-23 ENCOUNTER — Ambulatory Visit (INDEPENDENT_AMBULATORY_CARE_PROVIDER_SITE_OTHER): Payer: 59 | Admitting: Physician Assistant

## 2018-04-23 ENCOUNTER — Encounter: Payer: Self-pay | Admitting: Physician Assistant

## 2018-04-23 VITALS — BP 124/85 | HR 83 | Temp 98.0°F | Resp 18 | Ht 69.0 in | Wt 286.6 lb

## 2018-04-23 DIAGNOSIS — Z0000 Encounter for general adult medical examination without abnormal findings: Secondary | ICD-10-CM

## 2018-04-23 DIAGNOSIS — D473 Essential (hemorrhagic) thrombocythemia: Secondary | ICD-10-CM

## 2018-04-23 DIAGNOSIS — Z114 Encounter for screening for human immunodeficiency virus [HIV]: Secondary | ICD-10-CM | POA: Diagnosis not present

## 2018-04-23 DIAGNOSIS — Z130 Encounter for screening for diseases of the blood and blood-forming organs and certain disorders involving the immune mechanism: Secondary | ICD-10-CM | POA: Diagnosis not present

## 2018-04-23 DIAGNOSIS — Z1389 Encounter for screening for other disorder: Secondary | ICD-10-CM

## 2018-04-23 DIAGNOSIS — Z1329 Encounter for screening for other suspected endocrine disorder: Secondary | ICD-10-CM | POA: Diagnosis not present

## 2018-04-23 DIAGNOSIS — D75839 Thrombocytosis, unspecified: Secondary | ICD-10-CM

## 2018-04-23 DIAGNOSIS — Z131 Encounter for screening for diabetes mellitus: Secondary | ICD-10-CM | POA: Diagnosis not present

## 2018-04-23 DIAGNOSIS — Z13220 Encounter for screening for lipoid disorders: Secondary | ICD-10-CM

## 2018-04-23 NOTE — Progress Notes (Signed)
04/23/2018 4:37 PM   DOB: 03/03/1971 / MRN: [id number]  SUBJECTIVE:  Megan Mitchell is a 47 y.o. female presenting for annual exam.  She is receiving GYN care at [place].  She plans to continue there.  Pap smear and mammogram are current and within normal limits.  She is a never smoker, has a  history of very well-controlled hypertension, is not on diabetic and does denys a family history of early cardiac death.  No family history of colon cancer.  She is allergic to sulfa antibiotics.   She  has a past medical history of Anemia and Hypertension.    She  reports that she has never smoked. She has never used smokeless tobacco. She reports that she does not drink alcohol or use drugs. She  reports that she currently engages in sexual activity. She reports using the following method of birth control/protection: Pill. The patient  has a past surgical history that includes Cholecystectomy; Elbow surgery; and Tubal ligation.  Her family history includes Hypertension in her mother.  Review of Systems  Constitutional: Negative for chills, diaphoresis and fever.  Eyes: Negative.   Respiratory: Negative for cough, hemoptysis, sputum production, shortness of breath and wheezing.   Cardiovascular: Negative for chest pain, orthopnea and leg swelling.  Gastrointestinal: Negative for abdominal pain, blood in stool, constipation, diarrhea, heartburn, melena, nausea and vomiting.  Genitourinary: Negative for dysuria, flank pain, frequency, hematuria and urgency.  Skin: Negative for rash.  Neurological: Negative for dizziness, sensory change, speech change, focal weakness and headaches.    The problem list and medications were reviewed and updated by myself where necessary and exist elsewhere in the encounter.   OBJECTIVE:  BP 124/85   Pulse 83   Temp 98 F (36.7 C) (Oral)   Resp 18   Ht 5\' 9"  (1.753 m)   Wt 286 lb 9.6 oz (130 kg)   LMP 03/05/2018   SpO2 98%   BMI 42.32 kg/m   Wt  Readings from Last 3 Encounters:  04/23/18 286 lb 9.6 oz (130 kg)  01/05/18 294 lb 12.8 oz (133.7 kg)  08/21/17 290 lb 3.2 oz (131.6 kg)   Temp Readings from Last 3 Encounters:  04/23/18 98 F (36.7 C) (Oral)  01/05/18 98.7 F (37.1 C) (Oral)  08/21/17 98.6 F (37 C) (Oral)   BP Readings from Last 3 Encounters:  04/23/18 124/85  01/05/18 125/80  08/21/17 126/88   Pulse Readings from Last 3 Encounters:  04/23/18 83  01/05/18 86  08/21/17 84    Physical Exam  Constitutional: She is oriented to person, place, and time. She appears well-nourished. No distress.  Eyes: Pupils are equal, round, and reactive to light. EOM are normal.  Cardiovascular: Normal rate, regular rhythm, S1 normal, S2 normal, normal heart sounds and intact distal pulses. Exam reveals no gallop, no friction rub and no decreased pulses.  No murmur heard. Pulmonary/Chest: Effort normal. No stridor. No respiratory distress. She has no wheezes. She has no rales.  Abdominal: She exhibits no distension and no mass. There is no tenderness. There is no rebound and no guarding. No hernia.  Musculoskeletal: She exhibits no edema.  Neurological: She is alert and oriented to person, place, and time. No cranial nerve deficit. Gait normal.  Skin: Skin is dry. She is not diaphoretic.  Psychiatric: She has a normal mood and affect.  Vitals reviewed.    Lab Results  Component Value Date   WBC 10.8 08/21/2017   HGB  12.3 08/21/2017   HCT 37.7 08/21/2017   MCV 80 08/21/2017   PLT 589 (H) 08/21/2017    Lab Results  Component Value Date   CREATININE 0.81 08/21/2017   BUN 11 08/21/2017   NA 141 08/21/2017   K 3.5 08/21/2017   CL 96 08/21/2017   CO2 28 08/21/2017    Lab Results  Component Value Date   ALT 14 08/21/2017   AST 12 08/21/2017   ALKPHOS 65 08/21/2017   BILITOT 0.4 08/21/2017    Lab Results  Component Value Date   TSH 1.470 08/21/2017    ASSESSMENT AND PLAN:  Megan Mitchell was seen today for annual  exam.  Diagnoses and all orders for this visit:  Annual physical exam  Screening for HIV (human immunodeficiency virus) -     HIV antibody  Screening for diabetes mellitus -     Hemoglobin A1c  Screening for deficiency anemia -     CBC  Screening for nephropathy -     Comprehensive metabolic panel  Screening for lipid disorders -     Lipid panel  Screening for thyroid disorder -     TSH -     Cancel: HM Diabetes Foot Exam  Other orders -     Cancel: Pneumococcal polysaccharide vaccine 23-valent greater than or equal to 2yo subcutaneous/IM -     Cancel: Tdap vaccine greater than or equal to 7yo IM    The patient is advised to call or return to clinic if she does not see an improvement in symptoms, or to seek the care of the closest emergency department if she worsens with the above plan.   Philis Fendt, MHS, PA-C [facility] 04/23/2018 4:37 PM

## 2018-04-23 NOTE — Patient Instructions (Addendum)
Come back in three months for a hypertension check.   Take tylenol 1000 mg every 8 hours as needed for everyday aches and pain. Ibuprofen 600 mg every 8 hours is also safe.    Health Maintenance, Female Adopting a healthy lifestyle and getting preventive care can go a long way to promote health and wellness. Talk with your health care provider about what schedule of regular examinations is right for you. This is a good chance for you to check in with your provider about disease prevention and staying healthy. In between checkups, there are plenty of things you can do on your own. Experts have done a lot of research about which lifestyle changes and preventive measures are most likely to keep you healthy. Ask your health care provider for more information. Weight and diet Eat a healthy diet  Be sure to include plenty of vegetables, fruits, low-fat dairy products, and lean protein.  Do not eat a lot of foods high in solid fats, added sugars, or salt.  Get regular exercise. This is one of the most important things you can do for your health. ? Most adults should exercise for at least 150 minutes each week. The exercise should increase your heart rate and make you sweat (moderate-intensity exercise). ? Most adults should also do strengthening exercises at least twice a week. This is in addition to the moderate-intensity exercise.  Maintain a healthy weight  Body mass index (BMI) is a measurement that can be used to identify possible weight problems. It estimates body fat based on height and weight. Your health care provider can help determine your BMI and help you achieve or maintain a healthy weight.  For females 40 years of age and older: ? A BMI below 18.5 is considered underweight. ? A BMI of 18.5 to 24.9 is normal. ? A BMI of 25 to 29.9 is considered overweight. ? A BMI of 30 and above is considered obese.  Watch levels of cholesterol and blood lipids  You should start having your  blood tested for lipids and cholesterol at 49 years of age, then have this test every 5 years.  You may need to have your cholesterol levels checked more often if: ? Your lipid or cholesterol levels are high. ? You are older than 49 years of age. ? You are at high risk for heart disease.  Cancer screening Lung Cancer  Lung cancer screening is recommended for adults 60-61 years old who are at high risk for lung cancer because of a history of smoking.  A yearly low-dose CT scan of the lungs is recommended for people who: ? Currently smoke. ? Have quit within the past 15 years. ? Have at least a 30-pack-year history of smoking. A pack year is smoking an average of one pack of cigarettes a day for 1 year.  Yearly screening should continue until it has been 15 years since you quit.  Yearly screening should stop if you develop a health problem that would prevent you from having lung cancer treatment.  Breast Cancer  Practice breast self-awareness. This means understanding how your breasts normally appear and feel.  It also means doing regular breast self-exams. Let your health care provider know about any changes, no matter how small.  If you are in your 20s or 30s, you should have a clinical breast exam (CBE) by a health care provider every 1-3 years as part of a regular health exam.  If you are 54 or older, have a CBE  every year. Also consider having a breast X-ray (mammogram) every year.  If you have a family history of breast cancer, talk to your health care provider about genetic screening.  If you are at high risk for breast cancer, talk to your health care provider about having an MRI and a mammogram every year.  Breast cancer gene (BRCA) assessment is recommended for women who have family members with BRCA-related cancers. BRCA-related cancers include: ? Breast. ? Ovarian. ? Tubal. ? Peritoneal cancers.  Results of the assessment will determine the need for genetic  counseling and BRCA1 and BRCA2 testing.  Cervical Cancer Your health care provider may recommend that you be screened regularly for cancer of the pelvic organs (ovaries, uterus, and vagina). This screening involves a pelvic examination, including checking for microscopic changes to the surface of your cervix (Pap test). You may be encouraged to have this screening done every 3 years, beginning at age 21.  For women ages 30-65, health care providers may recommend pelvic exams and Pap testing every 3 years, or they may recommend the Pap and pelvic exam, combined with testing for human papilloma virus (HPV), every 5 years. Some types of HPV increase your risk of cervical cancer. Testing for HPV may also be done on women of any age with unclear Pap test results.  Other health care providers may not recommend any screening for nonpregnant women who are considered low risk for pelvic cancer and who do not have symptoms. Ask your health care provider if a screening pelvic exam is right for you.  If you have had past treatment for cervical cancer or a condition that could lead to cancer, you need Pap tests and screening for cancer for at least 20 years after your treatment. If Pap tests have been discontinued, your risk factors (such as having a new sexual partner) need to be reassessed to determine if screening should resume. Some women have medical problems that increase the chance of getting cervical cancer. In these cases, your health care provider may recommend more frequent screening and Pap tests.  Colorectal Cancer  This type of cancer can be detected and often prevented.  Routine colorectal cancer screening usually begins at 50 years of age and continues through 49 years of age.  Your health care provider may recommend screening at an earlier age if you have risk factors for colon cancer.  Your health care provider may also recommend using home test kits to check for hidden blood in the  stool.  A small camera at the end of a tube can be used to examine your colon directly (sigmoidoscopy or colonoscopy). This is done to check for the earliest forms of colorectal cancer.  Routine screening usually begins at age 50.  Direct examination of the colon should be repeated every 5-10 years through 49 years of age. However, you may need to be screened more often if early forms of precancerous polyps or small growths are found.  Skin Cancer  Check your skin from head to toe regularly.  Tell your health care provider about any new moles or changes in moles, especially if there is a change in a mole's shape or color.  Also tell your health care provider if you have a mole that is larger than the size of a pencil eraser.  Always use sunscreen. Apply sunscreen liberally and repeatedly throughout the day.  Protect yourself by wearing long sleeves, pants, a wide-brimmed hat, and sunglasses whenever you are outside.  Heart disease, diabetes,   and high blood pressure  High blood pressure causes heart disease and increases the risk of stroke. High blood pressure is more likely to develop in: ? People who have blood pressure in the high end of the normal range (130-139/85-89 mm Hg). ? People who are overweight or obese. ? People who are African American.  If you are 18-39 years of age, have your blood pressure checked every 3-5 years. If you are 40 years of age or older, have your blood pressure checked every year. You should have your blood pressure measured twice-once when you are at a hospital or clinic, and once when you are not at a hospital or clinic. Record the average of the two measurements. To check your blood pressure when you are not at a hospital or clinic, you can use: ? An automated blood pressure machine at a pharmacy. ? A home blood pressure monitor.  If you are between 55 years and 79 years old, ask your health care provider if you should take aspirin to prevent  strokes.  Have regular diabetes screenings. This involves taking a blood sample to check your fasting blood sugar level. ? If you are at a normal weight and have a low risk for diabetes, have this test once every three years after 49 years of age. ? If you are overweight and have a high risk for diabetes, consider being tested at a younger age or more often. Preventing infection Hepatitis B  If you have a higher risk for hepatitis B, you should be screened for this virus. You are considered at high risk for hepatitis B if: ? You were born in a country where hepatitis B is common. Ask your health care provider which countries are considered high risk. ? Your parents were born in a high-risk country, and you have not been immunized against hepatitis B (hepatitis B vaccine). ? You have HIV or AIDS. ? You use needles to inject street drugs. ? You live with someone who has hepatitis B. ? You have had sex with someone who has hepatitis B. ? You get hemodialysis treatment. ? You take certain medicines for conditions, including cancer, organ transplantation, and autoimmune conditions.  Hepatitis C  Blood testing is recommended for: ? Everyone born from 1945 through 1965. ? Anyone with known risk factors for hepatitis C.  Sexually transmitted infections (STIs)  You should be screened for sexually transmitted infections (STIs) including gonorrhea and chlamydia if: ? You are sexually active and are younger than 49 years of age. ? You are older than 49 years of age and your health care provider tells you that you are at risk for this type of infection. ? Your sexual activity has changed since you were last screened and you are at an increased risk for chlamydia or gonorrhea. Ask your health care provider if you are at risk.  If you do not have HIV, but are at risk, it may be recommended that you take a prescription medicine daily to prevent HIV infection. This is called pre-exposure prophylaxis  (PrEP). You are considered at risk if: ? You are sexually active and do not regularly use condoms or know the HIV status of your partner(s). ? You take drugs by injection. ? You are sexually active with a partner who has HIV.  Talk with your health care provider about whether you are at high risk of being infected with HIV. If you choose to begin PrEP, you should first be tested for HIV. You should then   be tested every 3 months for as long as you are taking PrEP. Pregnancy  If you are premenopausal and you may become pregnant, ask your health care provider about preconception counseling.  If you may become pregnant, take 400 to 800 micrograms (mcg) of folic acid every day.  If you want to prevent pregnancy, talk to your health care provider about birth control (contraception). Osteoporosis and menopause  Osteoporosis is a disease in which the bones lose minerals and strength with aging. This can result in serious bone fractures. Your risk for osteoporosis can be identified using a bone density scan.  If you are 45 years of age or older, or if you are at risk for osteoporosis and fractures, ask your health care provider if you should be screened.  Ask your health care provider whether you should take a calcium or vitamin D supplement to lower your risk for osteoporosis.  Menopause may have certain physical symptoms and risks.  Hormone replacement therapy may reduce some of these symptoms and risks. Talk to your health care provider about whether hormone replacement therapy is right for you. Follow these instructions at home:  Schedule regular health, dental, and eye exams.  Stay current with your immunizations.  Do not use any tobacco products including cigarettes, chewing tobacco, or electronic cigarettes.  If you are pregnant, do not drink alcohol.  If you are breastfeeding, limit how much and how often you drink alcohol.  Limit alcohol intake to no more than 1 drink per day for  nonpregnant women. One drink equals 12 ounces of beer, 5 ounces of wine, or 1 ounces of hard liquor.  Do not use street drugs.  Do not share needles.  Ask your health care provider for help if you need support or information about quitting drugs.  Tell your health care provider if you often feel depressed.  Tell your health care provider if you have ever been abused or do not feel safe at home. This information is not intended to replace advice given to you by your health care provider. Make sure you discuss any questions you have with your health care provider. Document Released: 05/02/2011 Document Revised: 03/24/2016 Document Reviewed: 07/21/2015 Elsevier Interactive Patient Education  2018 [employer].    IF you received an x-ray today, you will receive an invoice from [facility]. Please contact [facility] at [phone] with questions or concerns regarding your invoice.   IF you received labwork today, you will receive an invoice from [place]. Please contact [facility] at [phone] with questions or concerns regarding your invoice.   Our billing staff will not be able to assist you with questions regarding bills from these companies.  You will be contacted with the lab results as soon as they are available. The fastest way to get your results is to activate your My Chart account. Instructions are located on the last page of this paperwork. If you have not heard from [place] regarding the results in 2 weeks, please contact this office.

## 2018-04-24 LAB — COMPREHENSIVE METABOLIC PANEL
A/G RATIO: 1.2 (ref 1.2–2.2)
ALK PHOS: 61 IU/L (ref 39–117)
ALT: 14 IU/L (ref 0–32)
AST: 12 IU/L (ref 0–40)
Albumin: 4.2 g/dL (ref 3.5–5.5)
BILIRUBIN TOTAL: 0.3 mg/dL (ref 0.0–1.2)
BUN/Creatinine Ratio: 13 (ref 9–23)
BUN: 10 mg/dL (ref 6–24)
CHLORIDE: 101 mmol/L (ref 96–106)
CO2: 24 mmol/L (ref 20–29)
Calcium: 10.4 mg/dL — ABNORMAL HIGH (ref 8.7–10.2)
Creatinine, Ser: 0.75 mg/dL (ref 0.57–1.00)
GFR calc Af Amer: 109 mL/min/{1.73_m2} (ref >59)
GFR calc non Af Amer: 95 mL/min/{1.73_m2} (ref >59)
Globulin, Total: 3.4 g/dL (ref 1.5–4.5)
Glucose: 67 mg/dL (ref 65–99)
Potassium: 4 mmol/L (ref 3.5–5.2)
Sodium: 143 mmol/L (ref 134–144)
Total Protein: 7.6 g/dL (ref 6.0–8.5)

## 2018-04-24 LAB — HEMOGLOBIN A1C
Est. average glucose Bld gHb Est-mCnc: 117 mg/dL
Hgb A1c MFr Bld: 5.7 % — ABNORMAL HIGH (ref 4.8–5.6)

## 2018-04-24 LAB — LIPID PANEL
Chol/HDL Ratio: 3.9 ratio (ref 0.0–4.4)
Cholesterol, Total: 216 mg/dL — ABNORMAL HIGH (ref 100–199)
HDL: 55 mg/dL (ref >39)
LDL CALC: 142 mg/dL — AB (ref 0–99)
TRIGLYCERIDES: 96 mg/dL (ref 0–149)
VLDL CHOLESTEROL CAL: 19 mg/dL (ref 5–40)

## 2018-04-24 LAB — CBC
Hematocrit: 38.9 % (ref 34.0–46.6)
Hemoglobin: 12.2 g/dL (ref 11.1–15.9)
MCH: 25.5 pg — AB (ref 26.6–33.0)
MCHC: 31.4 g/dL — AB (ref 31.5–35.7)
MCV: 81 fL (ref 79–97)
PLATELETS: 612 10*3/uL — AB (ref 150–450)
RBC: 4.78 x10E6/uL (ref 3.77–5.28)
RDW: 14.8 % (ref 12.3–15.4)
WBC: 8.6 10*3/uL (ref 3.4–10.8)

## 2018-04-24 LAB — HIV ANTIBODY (ROUTINE TESTING W REFLEX): HIV Screen 4th Generation wRfx: NONREACTIVE

## 2018-04-24 LAB — TSH: TSH: 2.55 u[IU]/mL (ref 0.450–4.500)

## 2018-04-26 DIAGNOSIS — D473 Essential (hemorrhagic) thrombocythemia: Secondary | ICD-10-CM | POA: Diagnosis not present

## 2018-04-26 NOTE — Addendum Note (Signed)
Addended by: Ileana Roup on: 04/26/2018 08:17 AM   Modules accepted: Orders

## 2018-04-27 LAB — IRON,TIBC AND FERRITIN PANEL
Ferritin: 57 ng/mL (ref 15–150)
Iron Saturation: 12 % — ABNORMAL LOW (ref 15–55)
Iron: 52 ug/dL (ref 27–159)
Total Iron Binding Capacity: 439 ug/dL (ref 250–450)
UIBC: 387 ug/dL (ref 131–425)

## 2018-04-30 ENCOUNTER — Other Ambulatory Visit: Payer: Self-pay | Admitting: Physician Assistant

## 2018-04-30 DIAGNOSIS — I10 Essential (primary) hypertension: Secondary | ICD-10-CM

## 2018-04-30 LAB — PATHOLOGIST SMEAR REVIEW
BASOS: 0 %
Basophils Absolute: 0 10*3/uL (ref 0.0–0.2)
EOS (ABSOLUTE): 0.3 10*3/uL (ref 0.0–0.4)
Eos: 3 %
HEMOGLOBIN: 10.8 g/dL — AB (ref 11.1–15.9)
Hematocrit: 34.9 % (ref 34.0–46.6)
Immature Grans (Abs): 0 10*3/uL (ref 0.0–0.1)
Immature Granulocytes: 0 %
LYMPHS ABS: 2.7 10*3/uL (ref 0.7–3.1)
Lymphs: 30 %
MCH: 26 pg — AB (ref 26.6–33.0)
MCHC: 30.9 g/dL — ABNORMAL LOW (ref 31.5–35.7)
MCV: 84 fL (ref 79–97)
Monocytes Absolute: 0.5 10*3/uL (ref 0.1–0.9)
Monocytes: 5 %
Neutrophils Absolute: 5.4 10*3/uL (ref 1.4–7.0)
Neutrophils: 62 %
Platelets: 649 10*3/uL — ABNORMAL HIGH (ref 150–450)
RBC: 4.16 x10E6/uL (ref 3.77–5.28)
RDW: 15 % (ref 12.3–15.4)
WBC: 8.9 10*3/uL (ref 3.4–10.8)

## 2018-04-30 MED FILL — VALSARTAN 160 MG TABS: 160 | 90 days supply | Qty: 90 | Fill #0

## 2018-04-30 NOTE — Telephone Encounter (Signed)
valsartan refill Last Refill:08/21/17 # 90 3 RF Last OV: 04/23/18 PCP: Philis Fendt PA [place]

## 2018-05-07 ENCOUNTER — Telehealth: Payer: Self-pay | Admitting: Physician Assistant

## 2018-05-07 NOTE — Telephone Encounter (Signed)
Copied from [place] [id number]. Topic: Quick Communication - Lab Results >> May 07, 2018  8:58 AM Scherrie Gerlach wrote: Pt has not heard anything about labs.  Pt wants to know if she should return?  Pt states she keeps getting something on mychart, but does not know what it is  Pt st work until 3:30 pm and ok to call at work.

## 2018-05-10 ENCOUNTER — Other Ambulatory Visit: Payer: Self-pay

## 2018-05-10 ENCOUNTER — Other Ambulatory Visit: Payer: Self-pay | Admitting: Physician Assistant

## 2018-05-10 ENCOUNTER — Encounter: Payer: Self-pay | Admitting: Physician Assistant

## 2018-05-10 DIAGNOSIS — D473 Essential (hemorrhagic) thrombocythemia: Secondary | ICD-10-CM

## 2018-05-10 DIAGNOSIS — D75839 Thrombocytosis, unspecified: Secondary | ICD-10-CM

## 2018-05-10 NOTE — Telephone Encounter (Signed)
Message to Philis Fendt.  Add on labs need review and comment.

## 2018-05-14 MED FILL — AMLODIPINE BESYLATE 10 MG T: 10 | 90 days supply | Qty: 90 | Fill #2

## 2018-05-14 MED FILL — LARIN FE 1.5-30 TABLET: 1.5-30 | 84 days supply | Qty: 84 | Fill #1

## 2018-06-15 ENCOUNTER — Ambulatory Visit: Payer: 59 | Admitting: Physician Assistant

## 2018-06-22 ENCOUNTER — Ambulatory Visit (INDEPENDENT_AMBULATORY_CARE_PROVIDER_SITE_OTHER): Payer: 59 | Admitting: Physician Assistant

## 2018-06-22 ENCOUNTER — Encounter: Payer: Self-pay | Admitting: Physician Assistant

## 2018-06-22 VITALS — BP 141/79 | HR 71 | Temp 98.4°F | Resp 18 | Ht 69.0 in | Wt 290.0 lb

## 2018-06-22 DIAGNOSIS — Z3041 Encounter for surveillance of contraceptive pills: Secondary | ICD-10-CM

## 2018-06-22 DIAGNOSIS — Z23 Encounter for immunization: Secondary | ICD-10-CM | POA: Diagnosis not present

## 2018-06-22 DIAGNOSIS — I10 Essential (primary) hypertension: Secondary | ICD-10-CM

## 2018-06-22 MED ORDER — HYDROCHLOROTHIAZIDE 25 MG PO TABS: ORAL_TABLET | ORAL | 3 refills | Status: DC

## 2018-06-22 MED ORDER — AMLODIPINE BESYLATE 10 MG PO TABS: 10.0000 mg | ORAL_TABLET | Freq: Every day | ORAL | 3 refills | Status: DC

## 2018-06-22 MED ORDER — VALSARTAN 160 MG PO TABS: 160.0000 mg | ORAL_TABLET | Freq: Every day | ORAL | 3 refills | Status: DC

## 2018-06-22 MED ORDER — NORETHIN ACE-ETH ESTRAD-FE 1.5-30 MG-MCG PO TABS: 1.0000 | ORAL_TABLET | Freq: Every day | ORAL | 11 refills | Status: DC

## 2018-06-22 NOTE — Progress Notes (Signed)
06/22/2018 4:18 PM   DOB: 06-17-1971 / MRN: [id number]  SUBJECTIVE:  Megan Mitchell is a 47 y.o. female presenting for med refills. She feels well and denies complaint.   She is allergic to sulfa antibiotics.   She  has a past medical history of Anemia and Hypertension.    She  reports that she has never smoked. She has never used smokeless tobacco. She reports that she does not drink alcohol or use drugs. She  reports that she currently engages in sexual activity. She reports using the following method of birth control/protection: Pill. The patient  has a past surgical history that includes Cholecystectomy; Elbow surgery; and Tubal ligation.  Her family history includes Hypertension in her mother.  Review of Systems  Constitutional: Negative for chills, diaphoresis and fever.  Respiratory: Negative for cough, hemoptysis, sputum production, shortness of breath and wheezing.   Cardiovascular: Negative for chest pain, orthopnea and leg swelling.  Gastrointestinal: Negative for abdominal pain, blood in stool, constipation, diarrhea, heartburn, melena, nausea and vomiting.  Genitourinary: Negative for dysuria, flank pain, frequency, hematuria and urgency.  Skin: Negative for rash.  Neurological: Negative for dizziness.    The problem list and medications were reviewed and updated by myself where necessary and exist elsewhere in the encounter.   OBJECTIVE:  BP (!) 141/79   Pulse 71   Temp 98.4 F (36.9 C) (Oral)   Resp 18   Ht 5\' 9"  (1.753 m)   Wt 290 lb (131.5 kg)   SpO2 100%   BMI 42.83 kg/m   Wt Readings from Last 3 Encounters:  06/22/18 290 lb (131.5 kg)  04/23/18 286 lb 9.6 oz (130 kg)  01/05/18 294 lb 12.8 oz (133.7 kg)   Temp Readings from Last 3 Encounters:  06/22/18 98.4 F (36.9 C) (Oral)  04/23/18 98 F (36.7 C) (Oral)  01/05/18 98.7 F (37.1 C) (Oral)   BP Readings from Last 3 Encounters:  06/22/18 (!) 141/79  04/23/18 124/85  01/05/18 125/80   Pulse  Readings from Last 3 Encounters:  06/22/18 71  04/23/18 83  01/05/18 86    Physical Exam  Constitutional: She is oriented to person, place, and time. She appears well-nourished.  Non-toxic appearance. No distress.  HENT:  Right Ear: Hearing, tympanic membrane, external ear and ear canal normal.  Left Ear: Hearing, tympanic membrane, external ear and ear canal normal.  Nose: Nose normal. Right sinus exhibits no maxillary sinus tenderness and no frontal sinus tenderness. Left sinus exhibits no maxillary sinus tenderness and no frontal sinus tenderness.  Mouth/Throat: Uvula is midline, oropharynx is clear and moist and mucous membranes are normal. Mucous membranes are not dry. No oropharyngeal exudate, posterior oropharyngeal edema or tonsillar abscesses.  Eyes: Pupils are equal, round, and reactive to light. EOM are normal.  Cardiovascular: Normal rate, regular rhythm, S1 normal, S2 normal, normal heart sounds and intact distal pulses. Exam reveals no gallop, no friction rub and no decreased pulses.  No murmur heard. Pulmonary/Chest: Effort normal. No stridor. No respiratory distress. She has no wheezes. She has no rales.  Abdominal: She exhibits no distension.  Musculoskeletal: She exhibits no edema.  Lymphadenopathy:       Head (right side): No submandibular and no tonsillar adenopathy present.       Head (left side): No submandibular and no tonsillar adenopathy present.    She has no cervical adenopathy.  Neurological: She is alert and oriented to person, place, and time. She has normal strength  and normal reflexes. She is not disoriented. She displays no atrophy. No cranial nerve deficit or sensory deficit. She exhibits normal muscle tone. Coordination and gait normal.  Skin: Skin is warm and dry. She is not diaphoretic. No pallor.  Psychiatric: She has a normal mood and affect. Her behavior is normal.  Vitals reviewed.   Lab Results  Component Value Date   HGBA1C 5.7 (H) 04/23/2018      Lab Results  Component Value Date   WBC 8.9 04/26/2018   HGB 10.8 (L) 04/26/2018   HCT 34.9 04/26/2018   MCV 84 04/26/2018   PLT 649 (H) 04/26/2018    Lab Results  Component Value Date   CREATININE 0.75 04/23/2018   BUN 10 04/23/2018   NA 143 04/23/2018   K 4.0 04/23/2018   CL 101 04/23/2018   CO2 24 04/23/2018    Lab Results  Component Value Date   ALT 14 04/23/2018   AST 12 04/23/2018   ALKPHOS 61 04/23/2018   BILITOT 0.3 04/23/2018    Lab Results  Component Value Date   TSH 2.550 04/23/2018    Lab Results  Component Value Date   CHOL 216 (H) 04/23/2018   HDL 55 04/23/2018   LDLCALC 142 (H) 04/23/2018   TRIG 96 04/23/2018   CHOLHDL 3.9 04/23/2018   The 10-year ASCVD risk score Mikey Bussing DC Jr., et al., 2013) is: 5.1%   Values used to calculate the score:     Age: 44 years     Sex: Female     Is Non-Hispanic African American: Yes     Diabetic: No     Tobacco smoker: No     Systolic Blood Pressure: 237 mmHg     Is BP treated: Yes     HDL Cholesterol: 55 mg/dL     Total Cholesterol: 216 mg/dL    ASSESSMENT AND PLAN:  Megan Mitchell was seen today for medication refill.  Diagnoses and all orders for this visit:  Essential hypertension: Well controlled.  Meds refilled.   Encounter for surveillance of contraceptive pills    The patient is advised to call or return to clinic if she does not see an improvement in symptoms, or to seek the care of the closest emergency department if she worsens with the above plan.   Philis Fendt, MHS, PA-C [facility] 06/22/2018 4:18 PM

## 2018-06-22 NOTE — Patient Instructions (Addendum)
     If you stay here I recommend Dr. Pamella Pert.  If you want to stay with me I will see you at [facility] at [address], [place], [place] [address]. [phone]     If you have lab work done today you will be contacted with your lab results within the next 2 weeks.  If you have not heard from [place] then please contact us. The fastest way to get your results is to register for My Chart.   IF you received an x-ray today, you will receive an invoice from [facility]. Please contact [facility] at [phone] with questions or concerns regarding your invoice.   IF you received labwork today, you will receive an invoice from [place]. Please contact [facility] at [phone] with questions or concerns regarding your invoice.   Our billing staff will not be able to assist you with questions regarding bills from these companies.  You will be contacted with the lab results as soon as they are available. The fastest way to get your results is to activate your My Chart account. Instructions are located on the last page of this paperwork. If you have not heard from [place] regarding the results in 2 weeks, please contact this office.

## 2018-06-26 ENCOUNTER — Emergency Department (HOSPITAL_COMMUNITY): Payer: 59

## 2018-06-26 ENCOUNTER — Other Ambulatory Visit: Payer: Self-pay

## 2018-06-26 ENCOUNTER — Emergency Department (HOSPITAL_COMMUNITY)
Admission: EM | Admit: 2018-06-26 | Discharge: 2018-06-26 | Disposition: A | Discharge status: Home / Self Care | Payer: 59 | Attending: Emergency Medicine | Admitting: Emergency Medicine

## 2018-06-26 DIAGNOSIS — M7918 Myalgia, other site: Secondary | ICD-10-CM | POA: Diagnosis not present

## 2018-06-26 DIAGNOSIS — Z79899 Other long term (current) drug therapy: Secondary | ICD-10-CM | POA: Diagnosis not present

## 2018-06-26 DIAGNOSIS — S3992XA Unspecified injury of lower back, initial encounter: Secondary | ICD-10-CM | POA: Diagnosis not present

## 2018-06-26 DIAGNOSIS — M545 Low back pain: Secondary | ICD-10-CM | POA: Diagnosis not present

## 2018-06-26 DIAGNOSIS — M546 Pain in thoracic spine: Secondary | ICD-10-CM | POA: Diagnosis not present

## 2018-06-26 DIAGNOSIS — R52 Pain, unspecified: Secondary | ICD-10-CM | POA: Diagnosis not present

## 2018-06-26 DIAGNOSIS — I10 Essential (primary) hypertension: Secondary | ICD-10-CM | POA: Insufficient documentation

## 2018-06-26 DIAGNOSIS — M5489 Other dorsalgia: Secondary | ICD-10-CM | POA: Diagnosis not present

## 2018-06-26 LAB — POC URINE PREG, ED: PREG TEST UR: NEGATIVE

## 2018-06-26 MED ORDER — HYDROCODONE-ACETAMINOPHEN 5-325 MG PO TABS
1.0000 | ORAL_TABLET | Freq: Once | ORAL | Status: AC
  Administered 2018-06-26: 1 via ORAL
  Filled 2018-06-26: qty 1

## 2018-06-26 MED ORDER — METHOCARBAMOL 500 MG PO TABS: 500.0000 mg | ORAL_TABLET | Freq: Two times a day (BID) | ORAL | 0 refills | Status: DC

## 2018-06-26 MED ORDER — METHOCARBAMOL 500 MG PO TABS: 500.0000 mg | ORAL_TABLET | Freq: Two times a day (BID) | ORAL | 0 refills | Status: AC
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
## 2018-06-26 NOTE — Discharge Instructions (Signed)

## 2018-06-27 MED FILL — METHOCARBAMOL 500 MG TABLET: 500 | 5 days supply | Qty: 10 | Fill #0

## 2018-07-09 MED FILL — HYDROCHLOROTHIAZIDE 25 MG T: 25 | 90 days supply | Qty: 90 | Fill #2

## 2018-07-27 ENCOUNTER — Telehealth: Payer: Self-pay

## 2018-07-27 NOTE — Telephone Encounter (Signed)
Pt arrived to office.  States she was seen by Philis Fendt and told to return here for f/u.  States she has been calling all week, was originally told she has appt 09/27 at 11:00 then called back and told 09/27 at 1:30 to follow up.  Pt advised that no record of conversations and no appts available at this time.  Offered to find another appointment and pt questioned what to do for her pain.  Offered again to find next available appointment but patient walked out of office.

## 2018-07-27 NOTE — Telephone Encounter (Signed)
Pt will see dr hopper on Monday 07-30-18 at 4 pm

## 2018-07-30 ENCOUNTER — Encounter: Payer: Self-pay | Admitting: Family Medicine

## 2018-07-30 ENCOUNTER — Ambulatory Visit (INDEPENDENT_AMBULATORY_CARE_PROVIDER_SITE_OTHER): Payer: 59 | Admitting: Family Medicine

## 2018-07-30 VITALS — BP 93/57 | HR 73 | Temp 98.0°F | Resp 16 | Ht 68.0 in | Wt 285.0 lb

## 2018-07-30 DIAGNOSIS — S39012D Strain of muscle, fascia and tendon of lower back, subsequent encounter: Secondary | ICD-10-CM

## 2018-07-30 DIAGNOSIS — M545 Low back pain, unspecified: Secondary | ICD-10-CM

## 2018-07-30 MED ORDER — DICLOFENAC SODIUM 75 MG PO TBEC: 75.0000 mg | DELAYED_RELEASE_TABLET | Freq: Two times a day (BID) | ORAL | 1 refills | Status: DC

## 2018-07-30 MED ORDER — METAXALONE 800 MG PO TABS: ORAL_TABLET | ORAL | 1 refills | Status: DC

## 2018-07-30 MED FILL — LARIN FE 1.5-30 TABLET: 1.5-30 | 84 days supply | Qty: 84 | Fill #2

## 2018-07-30 MED FILL — METAXALONE 800 MG TABLET: 800 | 20 days supply | Qty: 60 | Fill #0

## 2018-07-30 MED FILL — VALSARTAN 160 MG TABLET: 160 | 90 days supply | Qty: 90 | Fill #1

## 2018-07-30 MED FILL — DICLOFENAC SOD EC 75 MG TAB: 75 | 15 days supply | Qty: 30 | Fill #0
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
## 2018-07-30 NOTE — Patient Instructions (Addendum)
    Take Skelaxin (metaxalone) 1 pill 2-3 times daily for muscle relaxant for the back.  Try to take 1 twice daily at least on a regular basis.  Take diclofenac (Voltaren) 75 mg 1 twice daily for pain and inflammation.  Do not take ibuprofen while you are on this.  In addition to this you can take Tylenol (acetaminophen) 2 tablets 500 mg 3 times daily for pain.  If you are getting worse at any time please return, but if you are continuing to have pain in 1 month please also return.  If after 2 weeks of this medication you do not feel like you are not improving, call back for a referral to physical therapy.   If you have lab work done today you will be contacted with your lab results within the next 2 weeks.  If you have not heard from [place] then please contact us. The fastest way to get your results is to register for My Chart.   IF you received an x-ray today, you will receive an invoice from [facility]. Please contact [facility] at [phone] with questions or concerns regarding your invoice.   IF you received labwork today, you will receive an invoice from [facility]. Please contact [facility] at [phone] with questions or concerns regarding your invoice.   Our billing staff will not be able to assist you with questions regarding bills from these companies.  You will be contacted with the lab results as soon as they are available. The fastest way to get your results is to activate your My Chart account. Instructions are located on the last page of this paperwork. If you have not heard from [place] regarding the results in 2 weeks, please contact this office.    '

## 2018-08-07 ENCOUNTER — Ambulatory Visit: Payer: 59 | Admitting: Family Medicine

## 2018-08-13 MED FILL — AMLODIPINE BESYLATE 10 MG T: 10 | 90 days supply | Qty: 90 | Fill #3

## 2018-09-10 ENCOUNTER — Ambulatory Visit: Payer: 59 | Admitting: Family Medicine

## 2018-10-01 MED FILL — HYDROCHLOROTHIAZIDE 25 MG T: 25 | 90 days supply | Qty: 90 | Fill #3

## 2018-10-05 ENCOUNTER — Ambulatory Visit: Payer: 59 | Admitting: Family Medicine

## 2018-10-29 MED FILL — DICLOFENAC SODIUM 75 MG TAB: 75 | 15 days supply | Qty: 30 | Fill #1

## 2018-10-29 MED FILL — AMLODIPINE BESYLATE 10 MG T: 10 | 90 days supply | Qty: 90 | Fill #0

## 2018-10-29 MED FILL — LARIN FE 1.5-30 TABLET: 1.5-30 | 84 days supply | Qty: 84 | Fill #3

## 2018-10-29 MED FILL — VALSARTAN 160 MG TABLET: 160 | 90 days supply | Qty: 90 | Fill #0

## 2018-11-30 ENCOUNTER — Encounter: Payer: Self-pay | Admitting: Family Medicine

## 2018-11-30 ENCOUNTER — Ambulatory Visit: Payer: 59 | Admitting: Family Medicine

## 2018-11-30 ENCOUNTER — Other Ambulatory Visit: Payer: Self-pay

## 2018-11-30 VITALS — BP 120/70 | HR 82 | Temp 98.0°F | Resp 20 | Ht 67.52 in | Wt 278.0 lb

## 2018-11-30 DIAGNOSIS — D473 Essential (hemorrhagic) thrombocythemia: Secondary | ICD-10-CM

## 2018-11-30 DIAGNOSIS — R7303 Prediabetes: Secondary | ICD-10-CM | POA: Diagnosis not present

## 2018-11-30 DIAGNOSIS — I10 Essential (primary) hypertension: Secondary | ICD-10-CM | POA: Diagnosis not present

## 2018-11-30 DIAGNOSIS — Z3041 Encounter for surveillance of contraceptive pills: Secondary | ICD-10-CM

## 2018-11-30 DIAGNOSIS — D75839 Thrombocytosis, unspecified: Secondary | ICD-10-CM

## 2018-11-30 DIAGNOSIS — F411 Generalized anxiety disorder: Secondary | ICD-10-CM | POA: Diagnosis not present

## 2018-11-30 MED ORDER — FLUOXETINE HCL 10 MG PO CAPS: 10.0000 mg | ORAL_CAPSULE | Freq: Every day | ORAL | 3 refills | Status: DC

## 2018-11-30 MED ORDER — NORETHIN ACE-ETH ESTRAD-FE 1.5-30 MG-MCG PO TABS: 1.0000 | ORAL_TABLET | Freq: Every day | ORAL | 11 refills | Status: DC

## 2018-11-30 MED FILL — FLUoxetine HCL 10 MG CAPS: 10 | 30 days supply | Qty: 30 | Fill #0

## 2018-11-30 NOTE — Progress Notes (Signed)
1/31/20204:14 PM  Megan Mitchell April 25, 1971, 48 y.o. female [id number]  Chief Complaint  Patient presents with  . Establish Care  . Medication Refill    norethin ace-eth estrad- fe  . Depression    score 5  . Anxiety    score 4    HPI:   Patient is a 48 y.o. female with past medical history significant for anemia from menorrhagia, thrombocytosis, HTN, HLP, prediabetes who presents today to establish care   Previous PCP Carlis Abbott, PA-C Having issues with anxiety, stressful work Has been prescribed fluoxetine and buspar but never really tried either Has never done counseling, does have good support in her pastor Started weight watchers in [place], has lost about 15 lbs Trying to walk twice a week Has had no interruption in her [facility]  CBC Latest Ref Rng & Units 04/26/2018 04/23/2018 08/21/2017  WBC 3.4 - 10.8 x10E3/uL 8.9 8.6 10.8  Hemoglobin 11.1 - 15.9 g/dL 10.8(L) 12.2 12.3  Hematocrit 34.0 - 46.6 % 34.9 38.9 37.7  Platelets 150 - 450 x10E3/uL 649(H) 612(H) 589(H)    Fall Risk  11/30/2018 01/05/2018 08/21/2017 12/10/2016 04/27/2016  Falls in the past year? 0 No No No No  Number falls in past yr: 1 - - - -  Injury with Fall? 0 - - - -    GAD 7 : Generalized Anxiety Score 11/30/2018 06/25/2016  Nervous, Anxious, on Edge 1 3  Control/stop worrying 1 2  Worry too much - different things 1 3  Trouble relaxing 0 3  Restless 0 1  Easily annoyed or irritable 1 1  Afraid - awful might happen 0 1  Total GAD 7 Score 4 14  Anxiety Difficulty Somewhat difficult Somewhat difficult     Depression screen [facility] 2/9 11/30/2018 11/30/2018 01/05/2018  Decreased Interest 0 0 0  Down, Depressed, Hopeless 1 0 0  PHQ - 2 Score 1 0 0  Altered sleeping 0 - -  Tired, decreased energy 2 - -  Change in appetite 2 - -  Feeling bad or failure about yourself  0 - -  Trouble concentrating 0 - -  Moving slowly or fidgety/restless 0 - -  Suicidal thoughts 0 - -  PHQ-9 Score 5 - -  Difficult doing work/chores  Somewhat difficult - -    Allergies  Allergen Reactions  . Sulfa Antibiotics Other (See Comments)    Makes mouth break out with a rash    Prior to Admission medications   Medication Sig Start Date End Date Taking? Authorizing Provider  amLODipine (NORVASC) 10 MG tablet Take 1 tablet (10 mg total) by mouth daily. 06/22/18  Yes Tereasa Coop, PA-C  diclofenac (VOLTAREN) 75 MG EC tablet Take 1 tablet (75 mg total) by mouth 2 (two) times daily. 07/30/18  Yes Posey Boyer, MD  ferrous fumarate (HEMOCYTE - 106 MG FE) 325 (106 FE) MG TABS Take 1 tablet by mouth 2 (two) times daily.    Yes [provider]  hydrochlorothiazide (HYDRODIURIL) 25 MG tablet TAKE 1 TABLET BY MOUTH ONCE DAILY. 06/22/18  Yes Tereasa Coop, PA-C  metaxalone (SKELAXIN) 800 MG tablet Take 1 tablet 2-3 times daily for back 07/30/18  Yes Posey Boyer, MD  norethindrone-ethinyl estradiol-iron (LARIN FE 1.5/30) 1.5-30 MG-MCG tablet Take 1 tablet by mouth daily. 06/22/18  Yes Tereasa Coop, PA-C  valsartan (DIOVAN) 160 MG tablet Take 1 tablet (160 mg total) by mouth daily. 06/22/18  Yes Tereasa Coop, PA-C  Past Medical History:  Diagnosis Date  . Anemia   . Hypertension     Past Surgical History:  Procedure Laterality Date  . CHOLECYSTECTOMY    . ELBOW SURGERY    . TUBAL LIGATION      Social History   Tobacco Use  . Smoking status: Never Smoker  . Smokeless tobacco: Never Used  Substance Use Topics  . Alcohol use: No    Family History  Problem Relation Age of Onset  . Hypertension Mother     Review of Systems  Constitutional: Negative for chills and fever.  Respiratory: Negative for cough and shortness of breath.   Cardiovascular: Negative for chest pain, palpitations and leg swelling.  Gastrointestinal: Negative for abdominal pain, constipation, nausea and vomiting.     OBJECTIVE:  Blood pressure 120/70, pulse 82, temperature 98 F (36.7 C), temperature source Oral, resp.  rate 20, height 5' 7.52" (1.715 m), weight 278 lb (126.1 kg), last menstrual period 11/14/2018, SpO2 100 %. Body mass index is 42.87 kg/m.   Physical Exam Vitals signs and nursing note reviewed.  Constitutional:      Appearance: She is well-developed.  HENT:     Head: Normocephalic and atraumatic.     Mouth/Throat:     Pharynx: No oropharyngeal exudate.  Eyes:     General: No scleral icterus.    Conjunctiva/sclera: Conjunctivae normal.     Pupils: Pupils are equal, round, and reactive to light.  Neck:     Musculoskeletal: Neck supple.  Cardiovascular:     Rate and Rhythm: Normal rate and regular rhythm.     Heart sounds: Normal heart sounds. No murmur. No friction rub. No gallop.   Pulmonary:     Effort: Pulmonary effort is normal.     Breath sounds: Normal breath sounds. No wheezing or rales.  Skin:    General: Skin is warm and dry.  Neurological:     Mental Status: She is alert and oriented to person, place, and time.    ASSESSMENT and PLAN  1. Essential hypertension Controlled. Continue current regime.  - Comprehensive metabolic panel  2. Thrombocytosis ([place]) Rechecking labs. Consider heme referral - CBC with Differential/Platelet  3. Prediabetes Checking labs today. Cont with LFM - Hemoglobin A1c  4. Encounter for surveillance of contraceptive pills - norethindrone-ethinyl estradiol-iron (LARIN FE 1.5/30) 1.5-30 MG-MCG tablet; Take 1 tablet by mouth daily.  5. GAD (generalized anxiety disorder) New diagnosis to me. Discussed treatment options, would like to try medication. Not interested in counseling at this time. Will restart fluoxetine low dose. Discussed with patient she is able to increase to 20mg  once a day after 2 weeks if needed. Reviewed med r/se/b. - FLUoxetine (PROZAC) 10 MG capsule; Take 1 capsule (10 mg total) by mouth daily.  Return in about 4 weeks (around 12/28/2018) for anxiety.    Rutherford Guys, MD Primary Care at [place], [place] [address] Ph.  [phone] Fax [phone]

## 2018-11-30 NOTE — Patient Instructions (Signed)
° ° ° °  If you have lab work done today you will be contacted with your lab results within the next 2 weeks.  If you have not heard from us then please contact us. The fastest way to get your results is to register for My Chart. ° ° °IF you received an x-ray today, you will receive an invoice from [facility]. Please contact [facility] at [phone] with questions or concerns regarding your invoice.  ° °IF you received labwork today, you will receive an invoice from [facility]. Please contact [facility] at [phone] with questions or concerns regarding your invoice.  ° °Our billing staff will not be able to assist you with questions regarding bills from these companies. ° °You will be contacted with the lab results as soon as they are available. The fastest way to get your results is to activate your My Chart account. Instructions are located on the last page of this paperwork. If you have not heard from us regarding the results in 2 weeks, please contact this office. °  ° ° ° °

## 2018-12-01 LAB — COMPREHENSIVE METABOLIC PANEL
ALT: 17 IU/L (ref 0–32)
AST: 18 IU/L (ref 0–40)
Albumin/Globulin Ratio: 1.3 (ref 1.2–2.2)
Albumin: 4.4 g/dL (ref 3.8–4.8)
Alkaline Phosphatase: 62 IU/L (ref 39–117)
BUN/Creatinine Ratio: 16 (ref 9–23)
BUN: 11 mg/dL (ref 6–24)
Bilirubin Total: 0.3 mg/dL (ref 0.0–1.2)
CO2: 20 mmol/L (ref 20–29)
Calcium: 10.5 mg/dL — ABNORMAL HIGH (ref 8.7–10.2)
Chloride: 99 mmol/L (ref 96–106)
Creatinine, Ser: 0.69 mg/dL (ref 0.57–1.00)
GFR calc Af Amer: 118 mL/min/{1.73_m2} (ref >59)
GFR calc non Af Amer: 103 mL/min/{1.73_m2} (ref >59)
Globulin, Total: 3.5 g/dL (ref 1.5–4.5)
Glucose: 88 mg/dL (ref 65–99)
Potassium: 4.5 mmol/L (ref 3.5–5.2)
Sodium: 142 mmol/L (ref 134–144)
Total Protein: 7.9 g/dL (ref 6.0–8.5)

## 2018-12-01 LAB — CBC WITH DIFFERENTIAL/PLATELET
Basophils Absolute: 0.1 10*3/uL (ref 0.0–0.2)
Basos: 1 %
EOS (ABSOLUTE): 0.2 10*3/uL (ref 0.0–0.4)
Eos: 2 %
Hematocrit: 38.4 % (ref 34.0–46.6)
Hemoglobin: 12.8 g/dL (ref 11.1–15.9)
Immature Grans (Abs): 0 10*3/uL (ref 0.0–0.1)
Immature Granulocytes: 0 %
Lymphocytes Absolute: 3.3 10*3/uL — ABNORMAL HIGH (ref 0.7–3.1)
Lymphs: 35 %
MCH: 26.6 pg (ref 26.6–33.0)
MCHC: 33.3 g/dL (ref 31.5–35.7)
MCV: 80 fL (ref 79–97)
Monocytes Absolute: 0.6 10*3/uL (ref 0.1–0.9)
Monocytes: 7 %
Neutrophils Absolute: 5.2 10*3/uL (ref 1.4–7.0)
Neutrophils: 55 %
Platelets: 587 10*3/uL — ABNORMAL HIGH (ref 150–450)
RBC: 4.81 x10E6/uL (ref 3.77–5.28)
RDW: 13.3 % (ref 11.7–15.4)
WBC: 9.4 10*3/uL (ref 3.4–10.8)

## 2018-12-01 LAB — HEMOGLOBIN A1C
Est. average glucose Bld gHb Est-mCnc: 114 mg/dL
Hgb A1c MFr Bld: 5.6 % (ref 4.8–5.6)

## 2018-12-07 ENCOUNTER — Telehealth: Payer: Self-pay | Admitting: Family Medicine

## 2018-12-07 NOTE — Telephone Encounter (Signed)
Copied from [place] [id number]. Topic: General - Other >> Dec 07, 2018  3:18 PM Yvette Rack wrote: Reason for CRM: : pt calling wanting someone to go over her labs with her she saw them in my chart she doesn't understand them

## 2018-12-08 ENCOUNTER — Other Ambulatory Visit: Payer: Self-pay

## 2018-12-08 DIAGNOSIS — D473 Essential (hemorrhagic) thrombocythemia: Secondary | ICD-10-CM

## 2018-12-08 DIAGNOSIS — D75839 Thrombocytosis, unspecified: Secondary | ICD-10-CM

## 2018-12-08 NOTE — Telephone Encounter (Signed)
Spoke with pt about labs, she verbalized understanding. Pt was advised to stop taking her hctz 3 days before appointment for labs. She verbalized understanding.

## 2018-12-17 NOTE — Addendum Note (Signed)
Addended by: Rutherford Guys on: 12/17/2018 08:29 PM   Modules accepted: Orders

## 2018-12-25 ENCOUNTER — Ambulatory Visit: Payer: 59 | Admitting: Family Medicine

## 2018-12-25 ENCOUNTER — Other Ambulatory Visit: Payer: Self-pay

## 2018-12-25 ENCOUNTER — Encounter: Payer: Self-pay | Admitting: Family Medicine

## 2018-12-25 VITALS — BP 128/84 | HR 111 | Temp 101.0°F | Resp 18 | Ht 67.52 in | Wt 274.2 lb

## 2018-12-25 DIAGNOSIS — R6889 Other general symptoms and signs: Secondary | ICD-10-CM | POA: Diagnosis not present

## 2018-12-25 DIAGNOSIS — R509 Fever, unspecified: Secondary | ICD-10-CM | POA: Diagnosis not present

## 2018-12-25 LAB — POC INFLUENZA A&B (BINAX/QUICKVUE)
INFLUENZA A, POC: NEGATIVE
Influenza B, POC: NEGATIVE

## 2018-12-25 MED ORDER — OSELTAMIVIR PHOSPHATE 75 MG PO CAPS: 75.0000 mg | ORAL_CAPSULE | Freq: Two times a day (BID) | ORAL | 0 refills | Status: DC

## 2018-12-25 MED FILL — OSELTAMIVIR PHOSPHATE 75 MG: 75 | 5 days supply | Qty: 10 | Fill #0

## 2018-12-25 NOTE — Patient Instructions (Addendum)
  Even though your flu test is negative, you have the classic symptoms of influenza.  It is been going for about 48 hours, so you are still in the range where you should benefit from Tamiflu.  Take 1 twice daily for 5 days.  Drink plenty of fluids and get enough rest  Take Tylenol or ibuprofen as needed for fever and aching  Over-the-counter cough syrups if needed  Do not return to work until you have been fever free for 24 hours.  If you are having more difficulty breathing or are getting worse, get rechecked.  You do have some wax building up in your ear, and when you get feeling better I recommend you use some over-the-counter Debrox drops to try and soften the wax and flush it out.   If you have lab work done today you will be contacted with your lab results within the next 2 weeks.  If you have not heard from [place] then please contact us. The fastest way to get your results is to register for My Chart.   IF you received an x-ray today, you will receive an invoice from [facility]. Please contact [facility] at [phone] with questions or concerns regarding your invoice.   IF you received labwork today, you will receive an invoice from [facility]. Please contact [facility] at [phone] with questions or concerns regarding your invoice.   Our billing staff will not be able to assist you with questions regarding bills from these companies.  You will be contacted with the lab results as soon as they are available. The fastest way to get your results is to activate your My Chart account. Instructions are located on the last page of this paperwork. If you have not heard from [place] regarding the results in 2 weeks, please contact this office.

## 2018-12-25 NOTE — Progress Notes (Signed)
Patient ID: Megan Mitchell, female    DOB: 09/03/1971  Age: 48 y.o. MRN: [id number]  Chief Complaint  Patient presents with  . Cough    started yesterday   . Fever    temp was 101 today   . Chills    everything hurts     Subjective:   Patient has a 48-hour history of being sick.  She has had some sore throat.  Head congestion.  Cough.  Feels wheezy at times.  She is coughing day and night.  Breathing deep triggers her cough.  She has a bad headache and generalized body aches.  Has not been around anyone with the flu that she is aware of, but had been at a rodeo with a lot of people all weekend.  She has had her flu shot.  Works the [employer].  Does insurance billing.  Current allergies, medications, problem list, past/family and social histories reviewed.  Objective:  BP 128/84   Pulse (!) 111   Temp (!) 101 F (38.3 C) (Oral)   Resp 18   Ht 5' 7.52" (1.715 m)   Wt 274 lb 3.2 oz (124.4 kg)   LMP 11/27/2018   SpO2 97%   BMI 42.29 kg/m   Ill-appearing lady, mask on, coughing a moderate amount.  TMs normal but some dry wax in both canals.  Throat clear.  Neck supple without nodes.  Eyes mildly injected.  Chest clear to auscultation.  Heart regular without murmur.  Assessment & Plan:   Assessment: 1. Fever, unspecified   2. Flu-like symptoms       Plan: Even though flu test is negative, her symptoms are classic enough that I think she deserves treatment.  Orders Placed This Encounter  Procedures  . POC Influenza A&B(BINAX/QUICKVUE)    Meds ordered this encounter  Medications  . oseltamivir (TAMIFLU) 75 MG capsule    Sig: Take 1 capsule (75 mg total) by mouth 2 (two) times daily.    Dispense:  10 capsule    Refill:  0         Patient Instructions    Even though your flu test is negative, you have the classic symptoms of influenza.  It is been going for about 48 hours, so you are still in the range where you should benefit from Tamiflu.  Take 1 twice  daily for 5 days.  Drink plenty of fluids and get enough rest  Take Tylenol or ibuprofen as needed for fever and aching  Over-the-counter cough syrups if needed  Do not return to work until you have been fever free for 24 hours.  If you are having more difficulty breathing or are getting worse, get rechecked.  You do have some wax building up in your ear, and when you get feeling better I recommend you use some over-the-counter Debrox drops to try and soften the wax and flush it out.   If you have lab work done today you will be contacted with your lab results within the next 2 weeks.  If you have not heard from [place] then please contact us. The fastest way to get your results is to register for My Chart.   IF you received an x-ray today, you will receive an invoice from [facility]. Please contact [facility] at [phone] with questions or concerns regarding your invoice.   IF you received labwork today, you will receive an invoice from [place]. Please contact [facility] at [phone] with questions or concerns regarding your  invoice.   Our billing staff will not be able to assist you with questions regarding bills from these companies.  You will be contacted with the lab results as soon as they are available. The fastest way to get your results is to activate your My Chart account. Instructions are located on the last page of this paperwork. If you have not heard from [place] regarding the results in 2 weeks, please contact this office.        Return if symptoms worsen or fail to improve.   Ruben Reason, MD 12/25/2018

## 2019-01-01 MED FILL — HYDROCHLOROTHIAZIDE 25 MG T: 25 | 90 days supply | Qty: 90 | Fill #0

## 2019-01-01 MED FILL — FLUoxetine HCL 10 MG CAPS: 10 | 30 days supply | Qty: 30 | Fill #1

## 2019-01-11 ENCOUNTER — Encounter: Payer: Self-pay | Admitting: Family Medicine

## 2019-01-11 ENCOUNTER — Ambulatory Visit: Payer: 59 | Admitting: Family Medicine

## 2019-01-11 ENCOUNTER — Other Ambulatory Visit: Payer: Self-pay

## 2019-01-11 VITALS — BP 112/62 | HR 74 | Temp 98.3°F | Resp 18 | Ht 67.32 in | Wt 278.2 lb

## 2019-01-11 DIAGNOSIS — I10 Essential (primary) hypertension: Secondary | ICD-10-CM | POA: Diagnosis not present

## 2019-01-11 DIAGNOSIS — F411 Generalized anxiety disorder: Secondary | ICD-10-CM | POA: Diagnosis not present

## 2019-01-11 DIAGNOSIS — Z3009 Encounter for other general counseling and advice on contraception: Secondary | ICD-10-CM | POA: Diagnosis not present

## 2019-01-11 DIAGNOSIS — E785 Hyperlipidemia, unspecified: Secondary | ICD-10-CM

## 2019-01-11 MED ORDER — NORETHINDRONE ACET-ETHINYL EST 1-20 MG-MCG PO TABS: 1.0000 | ORAL_TABLET | Freq: Every day | ORAL | 11 refills | Status: DC

## 2019-01-11 MED ORDER — FLUOXETINE HCL 20 MG PO CAPS: 20.0000 mg | ORAL_CAPSULE | Freq: Every day | ORAL | 1 refills | Status: DC

## 2019-01-11 MED FILL — FLUoxetine HCL 20 MG CAPS: 20 | 90 days supply | Qty: 90 | Fill #0

## 2019-01-11 MED FILL — NORETHIND-ETH ESTRAD 1-0.02: 1-20 | 21 days supply | Qty: 21 | Fill #0

## 2019-01-11 NOTE — Patient Instructions (Addendum)
For therapy -- 1. [facility] (Beth Kincaid, Ernest McCoy,Heather Kitchens, Karla Townsend) - [phone]  2. [facility] (Julie Whitt) - [phone] 3. [facility] - [phone] 4. [facility] - [phone] (do not file insurance) 5. [facility] - accepts most major insurances. [phone] or email [email or web address]    If you have lab work done today you will be contacted with your lab results within the next 2 weeks.  If you have not heard from us then please contact us. The fastest way to get your results is to register for My Chart.   IF you received an x-ray today, you will receive an invoice from [facility]. Please contact [facility] at [phone] with questions or concerns regarding your invoice.   IF you received labwork today, you will receive an invoice from [facility]. Please contact [facility] at [phone] with questions or concerns regarding your invoice.   Our billing staff will not be able to assist you with questions regarding bills from these companies.  You will be contacted with the lab results as soon as they are available. The fastest way to get your results is to activate your My Chart account. Instructions are located on the last page of this paperwork. If you have not heard from us regarding the results in 2 weeks, please contact this office.     

## 2019-01-11 NOTE — Progress Notes (Signed)
3/13/20203:17 PM  Megan Mitchell November 08, 1970, 48 y.o. female [id number]  Chief Complaint  Patient presents with  . Depression    score 3  . Anxiety    score 7    HPI:   Patient is a 48 y.o. 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 [place], elevated plts since at least 2012 She forgot to stop her HCTZ, denies any supplements, lactose intolerance, has minimal calcium intake She is wanting to decrease her OCPs as she almost 48 and getting close to perimenopause, periods and acne well controlled, has BTL   CBC Latest Ref Rng & Units 11/30/2018 04/26/2018 04/23/2018  WBC 3.4 - 10.8 x10E3/uL 9.4 8.9 8.6  Hemoglobin 11.1 - 15.9 g/dL 12.8 10.8(L) 12.2  Hematocrit 34.0 - 46.6 % 38.4 34.9 38.9  Platelets 150 - 450 x10E3/uL 587(H) 649(H) 612(H)    Lab Results  Component Value Date   CALCIUM 10.5 (H) 11/30/2018   CAION 1.13 07/31/2011    Lab Results  Component Value Date   HGBA1C 5.6 11/30/2018   HGBA1C 5.7 (H) 04/23/2018   Lab Results  Component Value Date   LDLCALC 142 (H) 04/23/2018   CREATININE 0.69 11/30/2018    Fall Risk  01/11/2019 12/25/2018 11/30/2018 01/05/2018 08/21/2017  Falls in the past year? 0 0 0 No No  Number falls in past yr: 0 - 1 - -  Injury with Fall? 0 - 0 - -  Follow up Falls evaluation completed Falls evaluation completed - - -    GAD 7 : Generalized Anxiety Score 01/11/2019 11/30/2018 06/25/2016  Nervous, Anxious, on Edge 1 1 3   Control/stop worrying 1 1 2   Worry too much - different things 1 1 3   Trouble relaxing 0 0 3  Restless 0 0 1  Easily annoyed or irritable 1 1 1   Afraid - awful  might happen 0 0 1  Total GAD 7 Score 4 4 14   Anxiety Difficulty Somewhat difficult Somewhat difficult Somewhat difficult     Depression screen [facility] 2/9 01/11/2019 12/25/2018 11/30/2018  Decreased Interest 0 0 0  Down, Depressed, Hopeless 2 0 1  PHQ - 2 Score 2 0 1  Altered sleeping 0 - 0  Tired, decreased energy 1 - 2  Change in appetite 0 - 2  Feeling bad or failure about yourself  0 - 0  Trouble concentrating 0 - 0  Moving slowly or fidgety/restless 0 - 0  Suicidal thoughts 0 - 0  PHQ-9 Score 3 - 5  Difficult doing work/chores Not difficult at all - Somewhat difficult    Allergies  Allergen Reactions  . Sulfa Antibiotics Other (See Comments)    Makes mouth break out with a rash    Prior to Admission medications   Medication Sig Start Date End Date Taking? Authorizing Provider  amLODipine (NORVASC) 10 MG tablet Take 1 tablet (10 mg total) by mouth daily. 06/22/18  Yes Tereasa Coop, PA-C  FLUoxetine (PROZAC) 10 MG capsule Take 1 capsule (10 mg total) by mouth daily. 11/30/18  Yes Rutherford Guys, MD  hydrochlorothiazide (HYDRODIURIL) 25 MG tablet TAKE 1 TABLET  BY MOUTH ONCE DAILY. 06/22/18  Yes Tereasa Coop, PA-C  norethindrone-ethinyl estradiol-iron (LARIN FE 1.5/30) 1.5-30 MG-MCG tablet Take 1 tablet by mouth daily. 11/30/18  Yes Rutherford Guys, MD  oseltamivir (TAMIFLU) 75 MG capsule Take 1 capsule (75 mg total) by mouth 2 (two) times daily. 12/25/18  Yes Posey Boyer, MD  valsartan (DIOVAN) 160 MG tablet Take 1 tablet (160 mg total) by mouth daily. 06/22/18  Yes Hillis Range    Past Medical History:  Diagnosis Date  . Anemia   . Hypertension     Past Surgical History:  Procedure Laterality Date  . CHOLECYSTECTOMY    . ELBOW SURGERY    . TUBAL LIGATION      Social History   Tobacco Use  . Smoking status: Never Smoker  . Smokeless tobacco: Never Used  Substance Use Topics  . Alcohol use: No    Family History  Problem Relation Age of Onset   . Hypertension Mother     ROS Per hpi  OBJECTIVE:  Blood pressure 112/62, pulse 74, temperature 98.3 F (36.8 C), temperature source Oral, resp. rate 18, height 5' 7.32" (1.71 m), weight 278 lb 3.2 oz (126.2 kg), last menstrual period 01/01/2019, SpO2 99 %. Body mass index is 43.16 kg/m.    Physical Exam Vitals signs and nursing note reviewed.  Constitutional:      Appearance: She is well-developed.  HENT:     Head: Normocephalic and atraumatic.  Eyes:     General: No scleral icterus.    Conjunctiva/sclera: Conjunctivae normal.     Pupils: Pupils are equal, round, and reactive to light.  Neck:     Musculoskeletal: Neck supple.  Pulmonary:     Effort: Pulmonary effort is normal.  Skin:    General: Skin is warm and dry.  Neurological:     Mental Status: She is alert and oriented to person, place, and time.     ASSESSMENT and PLAN  1. GAD (generalized anxiety disorder) Not controlled. Increasing prozac to 20mg  daily Provided referral for counseling  2. Essential hypertension Controlled. Continue current regime.  Might need to dc HCTZ if elevated iCa  3. Hypercalcemia Long standing, ionized calcium in the past normal. Rechecking ionize calcium. Further workup pending results. - Calcium, ionized  4. Hyperlipidemia, unspecified hyperlipidemia type Checking labs today, medications will be adjusted as needed.  - Lipid panel  5. Birth control counseling Decreasing dose. Discussed stopping. Reviewed r/se/b  Other orders - FLUoxetine (PROZAC) 20 MG capsule; Take 1 capsule (20 mg total) by mouth daily. - norethindrone-ethinyl estradiol (MICROGESTIN,JUNEL,LOESTRIN) 1-20 MG-MCG tablet; Take 1 tablet by mouth daily.  Return in about 3 months (around 04/13/2019) for anxiety.    Rutherford Guys, MD Primary Care at [place], [place] [address] Ph.  [phone] Fax [phone]

## 2019-01-12 LAB — LIPID PANEL
Chol/HDL Ratio: 3.7 ratio (ref 0.0–4.4)
Cholesterol, Total: 217 mg/dL — ABNORMAL HIGH (ref 100–199)
HDL: 58 mg/dL (ref >39)
LDL Calculated: 141 mg/dL — ABNORMAL HIGH (ref 0–99)
Triglycerides: 90 mg/dL (ref 0–149)
VLDL Cholesterol Cal: 18 mg/dL (ref 5–40)

## 2019-01-12 LAB — CALCIUM, IONIZED: Calcium, Ion: 5.2 mg/dL (ref 4.5–5.6)

## 2019-01-18 ENCOUNTER — Encounter: Payer: Self-pay | Admitting: Family Medicine

## 2019-01-21 MED FILL — LARIN FE 1.5-30 TABLET: 1.5-30 | 84 days supply | Qty: 84 | Fill #0

## 2019-01-26 MED FILL — VALSARTAN 160 MG TABLET: 160 | 90 days supply | Qty: 90 | Fill #1

## 2019-01-26 MED FILL — AMLODIPINE BESYLATE 10 MG T: 10 | 90 days supply | Qty: 90 | Fill #1

## 2019-04-02 MED FILL — HYDROCHLOROTHIAZIDE 25 MG T: 25 | 90 days supply | Qty: 90 | Fill #1

## 2019-04-03 ENCOUNTER — Ambulatory Visit: Payer: 59 | Admitting: Family Medicine

## 2019-04-03 ENCOUNTER — Encounter: Payer: Self-pay | Admitting: Family Medicine

## 2019-04-03 ENCOUNTER — Other Ambulatory Visit: Payer: Self-pay

## 2019-04-03 VITALS — BP 128/84 | HR 97 | Temp 99.1°F

## 2019-04-03 DIAGNOSIS — H1131 Conjunctival hemorrhage, right eye: Secondary | ICD-10-CM

## 2019-04-03 DIAGNOSIS — I10 Essential (primary) hypertension: Secondary | ICD-10-CM

## 2019-04-03 DIAGNOSIS — F411 Generalized anxiety disorder: Secondary | ICD-10-CM

## 2019-04-03 MED ORDER — FLUOXETINE HCL 40 MG PO CAPS: 40.0000 mg | ORAL_CAPSULE | Freq: Every day | ORAL | 3 refills | Status: DC

## 2019-04-03 NOTE — Patient Instructions (Addendum)
No concerns per history or exam. Routine HCM labs ordered. HCM reviewed/discussed. Anticipatory guidance regarding healthy weight, lifestyle and choices given.      If you have lab work done today you will be contacted with your lab results within the next 2 weeks.  If you have not heard from [place] then please contact us. The fastest way to get your results is to register for My Chart.   IF you received an x-ray today, you will receive an invoice from [facility]. Please contact [facility] at [phone] with questions or concerns regarding your invoice.   IF you received labwork today, you will receive an invoice from [facility]. Please contact [facility] at [phone] with questions or concerns regarding your invoice.   Our billing staff will not be able to assist you with questions regarding bills from these companies.  You will be contacted with the lab results as soon as they are available. The fastest way to get your results is to activate your My Chart account. Instructions are located on the last page of this paperwork. If you have not heard from [place] regarding the results in 2 weeks, please contact this office.     Subconjunctival Hemorrhage  Subconjunctival hemorrhage is bleeding that happens between the white part of your eye (sclera) and the clear membrane that covers the outside of your eye (conjunctiva). There are many tiny blood vessels near the surface of your eye. A subconjunctival hemorrhage happens when one or more of these vessels breaks and bleeds, causing a red patch to appear on your eye. This is similar to a bruise. Depending on the amount of bleeding, the red patch may only cover a small area of your eye or it may cover the entire visible part of the sclera. If a lot of blood collects under the conjunctiva, there may also be swelling. Subconjunctival hemorrhages do not affect your vision or cause pain, but your eye may feel irritated if there is swelling.  Subconjunctival hemorrhages usually do not require treatment, and they disappear on their own within two weeks. What are the causes? This condition may be caused by:  Mild trauma, such as rubbing your eye too hard.  Severe trauma or blunt injuries.  Coughing, sneezing, or vomiting.  Straining, such as when lifting a heavy object.  High blood pressure.  Recent eye surgery.  A history of diabetes.  Certain medicines, especially blood thinners (anticoagulants).  Other conditions, such as eye tumors, bleeding disorders, or blood vessel abnormalities. Subconjunctival hemorrhages can happen without an obvious cause. What are the signs or symptoms? Symptoms of this condition include:  A bright red or dark red patch on the white part of the eye. ? The red area may spread out to cover a larger area of the eye before it goes away. ? The red area may turn brownish-yellow before it goes away.  Swelling.  Mild eye irritation. How is this diagnosed? This condition is diagnosed with a physical exam. If your subconjunctival hemorrhage was caused by trauma, your health care provider may refer you to an eye specialist (ophthalmologist) or another specialist to check for other injuries. You may have other tests, including:  An eye exam.  A blood pressure check.  Blood tests to check for bleeding disorders. If your subconjunctival hemorrhage was caused by trauma, X-rays or a CT scan may be done to check for other injuries. How is this treated? Usually, no treatment is needed. Your health care provider may recommend eye drops or  cold compresses to help with discomfort. Follow these instructions at home:  Take over-the-counter and prescription medicines only as directed by your health care provider.  Use eye drops or cold compresses to help with discomfort as directed by your health care provider.  Avoid activities, things, and environments that may irritate or injure your eye.  Keep all  follow-up visits as told by your health care provider. This is important. Contact a health care provider if:  You have pain in your eye.  The bleeding does not go away within 3 weeks.  You keep getting new subconjunctival hemorrhages. Get help right away if:  Your vision changes or you have difficulty seeing.  You suddenly develop severe sensitivity to light.  You develop a severe headache, persistent vomiting, confusion, or abnormal tiredness (lethargy).  Your eye seems to bulge or protrude from your eye socket.  You develop unexplained bruises on your body.  You have unexplained bleeding in another area of your body. This information is not intended to replace advice given to you by your health care provider. Make sure you discuss any questions you have with your health care provider. Document Released: 10/17/2005 Document Revised: 06/18/2018 Document Reviewed: 12/24/2014 Elsevier Interactive Patient Education  2019 [employer].

## 2019-04-03 NOTE — Progress Notes (Signed)
6/3/20204:39 PM  Megan Mitchell 1971-04-16, 48 y.o., female [id number]  Chief Complaint  Patient presents with  . Conjunctivitis    discovered red eye 2 days ago, denies anything hitting in the eye or pain. No problem with vision, but did buy readers. has been working on pc for along time.    HPI:   Patient is a 48 y.o. female with past medical history significant for HTN and GAD who presents today for red eye  Red eye, right, 2 days Denies any pain,drainage, trauma or vision changes  Reports that anxiety is better but not controlled Would like to increase medication    Visual Acuity Screening   Right eye Left eye Both eyes  Without correction:     With correction: 20 /50 20/50 20/50    Fall Risk  04/03/2019 01/11/2019 12/25/2018 11/30/2018 01/05/2018  Falls in the past year? 0 0 0 0 No  Number falls in past yr: 0 0 - 1 -  Injury with Fall? 0 0 - 0 -  Follow up - Falls evaluation completed Falls evaluation completed - -     Depression screen [facility] 2/9 04/03/2019 01/11/2019 12/25/2018  Decreased Interest 0 0 0  Down, Depressed, Hopeless 0 2 0  PHQ - 2 Score 0 2 0  Altered sleeping - 0 -  Tired, decreased energy - 1 -  Change in appetite - 0 -  Feeling bad or failure about yourself  - 0 -  Trouble concentrating - 0 -  Moving slowly or fidgety/restless - 0 -  Suicidal thoughts - 0 -  PHQ-9 Score - 3 -  Difficult doing work/chores - Not difficult at all -    Allergies  Allergen Reactions  . Sulfa Antibiotics Other (See Comments)    Makes mouth break out with a rash    Prior to Admission medications   Medication Sig Start Date End Date Taking? Authorizing Provider  amLODipine (NORVASC) 10 MG tablet Take 1 tablet (10 mg total) by mouth daily. 06/22/18  Yes Tereasa Coop, PA-C  FLUoxetine (PROZAC) 20 MG capsule Take 1 capsule (20 mg total) by mouth daily. 01/11/19  Yes Rutherford Guys, MD  hydrochlorothiazide (HYDRODIURIL) 25 MG tablet TAKE 1 TABLET BY MOUTH ONCE DAILY.  06/22/18  Yes Tereasa Coop, PA-C  norethindrone-ethinyl estradiol (MICROGESTIN,JUNEL,LOESTRIN) 1-20 MG-MCG tablet Take 1 tablet by mouth daily. 01/11/19  Yes Rutherford Guys, MD  valsartan (DIOVAN) 160 MG tablet Take 1 tablet (160 mg total) by mouth daily. 06/22/18  Yes Hillis Range    Past Medical History:  Diagnosis Date  . Anemia   . Hypertension     Past Surgical History:  Procedure Laterality Date  . CHOLECYSTECTOMY    . ELBOW SURGERY    . TUBAL LIGATION      Social History   Tobacco Use  . Smoking status: Never Smoker  . Smokeless tobacco: Never Used  Substance Use Topics  . Alcohol use: No    Family History  Problem Relation Age of Onset  . Hypertension Mother     ROS Per hpi  OBJECTIVE:  Today's Vitals   04/03/19 1553  BP: 128/84  Pulse: 97  Temp: 99.1 F (37.3 C)  TempSrc: Oral  SpO2: 95%   There is no height or weight on file to calculate BMI.   Physical Exam Vitals signs and nursing note reviewed.  Constitutional:      Appearance: Normal appearance.  HENT:  Head: Normocephalic and atraumatic.  Eyes:     General: Lids are normal. No visual field deficit.       Right eye: No discharge.        Left eye: No discharge.     Extraocular Movements: Extraocular movements intact.     Conjunctiva/sclera:     Right eye: Hemorrhage present.     Left eye: No hemorrhage. Neurological:     Mental Status: She is alert.     ASSESSMENT and PLAN  1. Subconjunctival hemorrhage of right eye Reassured patient. Provided educational handout.  2. GAD (generalized anxiety disorder) Not controlled. Increasing fluoxetine. Check-in via mychart in 1 month.   3. Essential hypertension Controlled. Continue current regime.   Other orders - FLUoxetine (PROZAC) 40 MG capsule; Take 1 capsule (40 mg total) by mouth daily.  Return in about 6 months (around 10/03/2019).    Rutherford Guys, MD Primary Care at [place],   [address] Ph.  [phone] Fax [phone]

## 2019-04-04 MED FILL — FLUoxetine HCL 40 MG CAPS: 40 | 30 days supply | Qty: 30 | Fill #0

## 2019-04-17 ENCOUNTER — Other Ambulatory Visit: Payer: Self-pay | Admitting: Family Medicine

## 2019-04-17 MED ORDER — LOSARTAN POTASSIUM 50 MG PO TABS: 50.0000 mg | ORAL_TABLET | Freq: Every day | ORAL | 3 refills | Status: DC

## 2019-04-17 MED FILL — LARIN FE 1.5-30 TABLET: 1.5-30 | 84 days supply | Qty: 84 | Fill #1

## 2019-04-17 MED FILL — LOSARTAN POTASSIUM 50 MG TA: 50 | 30 days supply | Qty: 30 | Fill #0

## 2019-04-17 MED FILL — AMLODIPINE BESYLATE 10 MG T: 10 | 90 days supply | Qty: 90 | Fill #2

## 2019-04-18 ENCOUNTER — Ambulatory Visit: Payer: 59 | Admitting: Family Medicine

## 2019-05-01 MED FILL — FLUoxetine HCL 40 MG CAPS: 40 | 30 days supply | Qty: 30 | Fill #1

## 2019-05-27 MED FILL — FLUoxetine HCL 40 MG CAPS: 40 | 30 days supply | Qty: 30 | Fill #2

## 2019-05-27 MED FILL — LOSARTAN POTASSIUM 50 MG TA: 50 | 30 days supply | Qty: 30 | Fill #1

## 2019-06-28 ENCOUNTER — Other Ambulatory Visit: Payer: Self-pay | Admitting: Physician Assistant

## 2019-06-28 DIAGNOSIS — I10 Essential (primary) hypertension: Secondary | ICD-10-CM

## 2019-06-28 MED FILL — HYDROCHLOROTHIAZIDE 25 MG T: 25 | 90 days supply | Qty: 90 | Fill #0

## 2019-06-28 MED FILL — FLUoxetine HCL 40 MG CAPS: 40 | 30 days supply | Qty: 30 | Fill #3

## 2019-06-28 MED FILL — LOSARTAN POTASSIUM 50 MG TA: 50 | 30 days supply | Qty: 30 | Fill #2

## 2019-06-28 NOTE — Telephone Encounter (Signed)
Requested medication (s) are due for refill today: yes  Requested medication (s) are on the active medication list: yes  Last refill:  04/02/2019  Future visit scheduled: yes  Notes to clinic: ordering provider and pcp are different  Requested Prescriptions  Pending Prescriptions Disp Refills   hydrochlorothiazide (HYDRODIURIL) 25 MG tablet [Pharmacy Med Name: HYDROCHLOROTHIAZIDE 25 MG T 25 TAB] 90 tablet 3    Sig: TAKE 1 TABLET BY MOUTH ONCE DAILY.     Cardiovascular: Diuretics - Thiazide Failed - 06/28/2019  7:40 AM      Failed - Ca in normal range and within 360 days    Calcium  Date Value Ref Range Status  11/30/2018 10.5 (H) 8.7 - 10.2 mg/dL Final         Passed - Cr in normal range and within 360 days    Creat  Date Value Ref Range Status  04/22/2015 0.74 0.50 - 1.10 mg/dL Final   Creatinine, Ser  Date Value Ref Range Status  11/30/2018 0.69 0.57 - 1.00 mg/dL Final         Passed - K in normal range and within 360 days    Potassium  Date Value Ref Range Status  11/30/2018 4.5 3.5 - 5.2 mmol/L Final    Comment:    Specimen received hemolyzed. Clinical correlation indicated.         Passed - Na in normal range and within 360 days    Sodium  Date Value Ref Range Status  11/30/2018 142 134 - 144 mmol/L Final         Passed - Last BP in normal range    BP Readings from Last 1 Encounters:  04/03/19 128/84         Passed - Valid encounter within last 6 months    Recent Outpatient Visits          2 months ago Subconjunctival hemorrhage of right eye   [facility], Lilia Argue, MD   5 months ago GAD (generalized anxiety disorder)   [facility], Lilia Argue, MD   6 months ago Fever, unspecified   Primary Care at [facility], Fenton Malling, MD   7 months ago Essential hypertension   [facility], Lilia Argue, MD   11 months ago Acute midline low back pain without sciatica   Primary Care at [facility], Fenton Malling, MD       Future Appointments            In 3 months Rutherford Guys, MD Primary Care at [place], [facility]

## 2019-07-03 MED FILL — LARIN FE 1.5-30 TABLET: 1.5-30 | 84 days supply | Qty: 84 | Fill #2

## 2019-07-29 ENCOUNTER — Other Ambulatory Visit: Payer: Self-pay | Admitting: Family Medicine

## 2019-07-29 ENCOUNTER — Other Ambulatory Visit: Payer: Self-pay | Admitting: Physician Assistant

## 2019-07-29 DIAGNOSIS — I10 Essential (primary) hypertension: Secondary | ICD-10-CM

## 2019-07-29 MED FILL — LOSARTAN POTASSIUM 50 MG TA: 50 | 30 days supply | Qty: 30 | Fill #3

## 2019-07-29 MED FILL — FLUoxetine HCL 40 MG CAPS: 40 | 30 days supply | Qty: 30 | Fill #0

## 2019-07-29 NOTE — Telephone Encounter (Signed)
Requested medication (s) are due for refill today: yes  Requested medication (s) are on the active medication list: yes  Last refill:  04/17/2019  Future visit scheduled: yes  Notes to clinic: ordering provider and pcp are different    Requested Prescriptions  Pending Prescriptions Disp Refills   amLODipine (NORVASC) 10 MG tablet [Pharmacy Med Name: AMLODIPINE BESYLATE 10 MG T 10 Tablet] 90 tablet 3    Sig: Take 1 tablet (10 mg total) by mouth daily.     Cardiovascular:  Calcium Channel Blockers Passed - 07/29/2019  9:41 AM      Passed - Last BP in normal range    BP Readings from Last 1 Encounters:  04/03/19 128/84         Passed - Valid encounter within last 6 months    Recent Outpatient Visits          3 months ago Subconjunctival hemorrhage of right eye   [facility], Lilia Argue, MD   6 months ago GAD (generalized anxiety disorder)   [facility], Lilia Argue, MD   7 months ago Fever, unspecified   Primary Care at [facility], Fenton Malling, MD   8 months ago Essential hypertension   [facility], Lilia Argue, MD   12 months ago Acute midline low back pain without sciatica   Primary Care at [facility], Fenton Malling, MD      Future Appointments            In 2 months Rutherford Guys, MD Primary Care at [place], [facility]

## 2019-07-30 MED FILL — AMLODIPINE BESYLATE 10 MG T: 10 | 90 days supply | Qty: 90 | Fill #0

## 2019-08-06 ENCOUNTER — Other Ambulatory Visit (HOSPITAL_COMMUNITY)
Admission: RE | Admit: 2019-08-06 | Discharge: 2019-08-06 | Disposition: A | Discharge status: Home / Self Care | Payer: 59 | Source: Ambulatory Visit | Attending: Family Medicine | Admitting: Family Medicine

## 2019-08-06 ENCOUNTER — Encounter: Payer: Self-pay | Admitting: Family Medicine

## 2019-08-06 ENCOUNTER — Other Ambulatory Visit: Payer: Self-pay

## 2019-08-06 ENCOUNTER — Ambulatory Visit: Payer: 59 | Admitting: Family Medicine

## 2019-08-06 VITALS — BP 113/75 | HR 83 | Temp 98.5°F | Ht 67.32 in | Wt 267.0 lb

## 2019-08-06 DIAGNOSIS — R300 Dysuria: Secondary | ICD-10-CM | POA: Insufficient documentation

## 2019-08-06 LAB — POCT URINALYSIS DIP (MANUAL ENTRY)
Bilirubin, UA: NEGATIVE
Glucose, UA: NEGATIVE mg/dL
Ketones, POC UA: NEGATIVE mg/dL
Leukocytes, UA: NEGATIVE
Nitrite, UA: NEGATIVE
Protein Ur, POC: NEGATIVE mg/dL
Spec Grav, UA: 1.025 (ref 1.010–1.025)
Urobilinogen, UA: 0.2 E.U./dL
pH, UA: 7 (ref 5.0–8.0)

## 2019-08-06 NOTE — Progress Notes (Signed)
10/6/20204:30 PM  Megan Mitchell 1971-01-29, 48 y.o., female [id number]  Chief Complaint  Patient presents with  . Dysuria    for the past 2 days, taking otc for the sx. Also wants to talk about if she should be considering coming of the bc pills    HPI:   Patient is a 48 y.o. female with past medical history significant for HTN, HLP, prediabetes, GAD who presents today for dysuria  2 days started having mild burning after urinating No frequency, no urgency No vaginal discharge or irritation She has been drinking enough water No fever, chills, nausea, vomiting, abd pain, flank pain Menses just ended Has been taking OTC AZO Sexually active BTL, on OCPs due to menorrhagia  Depression screen [facility] 2/9 04/03/2019 01/11/2019 12/25/2018  Decreased Interest 0 0 0  Down, Depressed, Hopeless 0 2 0  PHQ - 2 Score 0 2 0  Altered sleeping - 0 -  Tired, decreased energy - 1 -  Change in appetite - 0 -  Feeling bad or failure about yourself  - 0 -  Trouble concentrating - 0 -  Moving slowly or fidgety/restless - 0 -  Suicidal thoughts - 0 -  PHQ-9 Score - 3 -  Difficult doing work/chores - Not difficult at all -    Fall Risk  04/03/2019 01/11/2019 12/25/2018 11/30/2018 01/05/2018  Falls in the past year? 0 0 0 0 No  Number falls in past yr: 0 0 - 1 -  Injury with Fall? 0 0 - 0 -  Follow up - Falls evaluation completed Falls evaluation completed - -     Allergies  Allergen Reactions  . Sulfa Antibiotics Other (See Comments)    Makes mouth break out with a rash    Prior to Admission medications   Medication Sig Start Date End Date Taking? Authorizing Provider  amLODipine (NORVASC) 10 MG tablet TAKE 1 TABLET (10 MG TOTAL) BY MOUTH DAILY. 07/30/19  Yes Rutherford Guys, MD  FLUoxetine (PROZAC) 40 MG capsule TAKE 1 CAPSULE BY MOUTH ONCE A DAY 07/29/19  Yes Rutherford Guys, MD  hydrochlorothiazide (HYDRODIURIL) 25 MG tablet TAKE 1 TABLET BY MOUTH ONCE DAILY. 06/28/19  Yes Rutherford Guys,  MD  losartan (COZAAR) 50 MG tablet Take 1 tablet (50 mg total) by mouth daily. 04/17/19  Yes Rutherford Guys, MD  norethindrone-ethinyl estradiol (MICROGESTIN,JUNEL,LOESTRIN) 1-20 MG-MCG tablet Take 1 tablet by mouth daily. 01/11/19  Yes Rutherford Guys, MD    Past Medical History:  Diagnosis Date  . Anemia   . Hypertension     Past Surgical History:  Procedure Laterality Date  . CHOLECYSTECTOMY    . ELBOW SURGERY    . TUBAL LIGATION      Social History   Tobacco Use  . Smoking status: Never Smoker  . Smokeless tobacco: Never Used  Substance Use Topics  . Alcohol use: No    Family History  Problem Relation Age of Onset  . Hypertension Mother     ROS Per hpi  OBJECTIVE:  Today's Vitals   08/06/19 1612  BP: 113/75  Pulse: 83  Temp: 98.5 F (36.9 C)  SpO2: 99%  Weight: 267 lb (121.1 kg)  Height: 5' 7.32" (1.71 m)   Body mass index is 41.42 kg/m.   Physical Exam Vitals signs and nursing note reviewed.  Constitutional:      Appearance: She is well-developed.  HENT:     Head: Normocephalic and atraumatic.  Eyes:  General: No scleral icterus.    Conjunctiva/sclera: Conjunctivae normal.     Pupils: Pupils are equal, round, and reactive to light.  Neck:     Musculoskeletal: Neck supple.  Pulmonary:     Effort: Pulmonary effort is normal.  Skin:    General: Skin is warm and dry.  Neurological:     Mental Status: She is alert and oriented to person, place, and time.     Results for orders placed or performed in visit on 08/06/19 (from the past 24 hour(s))  POCT urinalysis dipstick     Status: Abnormal   Collection Time: 08/06/19  4:23 PM  Result Value Ref Range   Color, UA yellow yellow   Clarity, UA clear clear   Glucose, UA negative negative mg/dL   Bilirubin, UA negative negative   Ketones, POC UA negative negative mg/dL   Spec Grav, UA 1.025 1.010 - 1.025   Blood, UA small (A) negative   pH, UA 7.0 5.0 - 8.0   Protein Ur, POC negative  negative mg/dL   Urobilinogen, UA 0.2 0.2 or 1.0 E.U./dL   Nitrite, UA Negative Negative   Leukocytes, UA Negative Negative    No results found.   ASSESSMENT and PLAN  1. Dysuria UA no infection. Labs pending. Discussed pushing fluids, avoidance of bladder irritating foods. RTC precautions given - POCT urinalysis dipstick - no signs of infection - Urine cytology ancillary only - Urine Culture  Return if symptoms worsen or fail to improve.    Rutherford Guys, MD Primary Care at [place], [place] [address] Ph.  [phone] Fax [phone]

## 2019-08-06 NOTE — Patient Instructions (Signed)

## 2019-08-08 ENCOUNTER — Other Ambulatory Visit: Payer: Self-pay | Admitting: Family Medicine

## 2019-08-08 ENCOUNTER — Encounter: Payer: Self-pay | Admitting: Family Medicine

## 2019-08-08 ENCOUNTER — Telehealth: Payer: Self-pay | Admitting: Family Medicine

## 2019-08-08 LAB — URINE CULTURE

## 2019-08-08 MED ORDER — CEPHALEXIN 500 MG PO CAPS: 500.0000 mg | ORAL_CAPSULE | Freq: Two times a day (BID) | ORAL | 0 refills | Status: DC

## 2019-08-08 MED ORDER — NITROFURANTOIN MONOHYD MACRO 100 MG PO CAPS: 100.0000 mg | ORAL_CAPSULE | Freq: Two times a day (BID) | ORAL | 0 refills | Status: DC

## 2019-08-08 MED FILL — CEPHALEXIN 500 MG CAPSULE: 500 | 5 days supply | Qty: 10 | Fill #0

## 2019-08-08 NOTE — Addendum Note (Signed)
Addended by: Rutherford Guys on: 08/08/2019 08:07 AM   Modules accepted: Orders

## 2019-08-08 NOTE — Telephone Encounter (Signed)
Pt would like a 1 day yeast infection pill. She would like 2 pills of this medication, she say she has to take this when she is on an antibiotic. Please advise pt at [phone]. Please use same pharmacy.

## 2019-08-09 MED ORDER — FLUCONAZOLE 150 MG PO TABS: 150.0000 mg | ORAL_TABLET | Freq: Once | ORAL | 0 refills | Status: AC

## 2019-08-09 MED FILL — FLUCONAZOLE 150 MG TABS: 150 | 2 days supply | Qty: 2 | Fill #0

## 2019-08-09 NOTE — Telephone Encounter (Signed)
Could you send this pt medication for yeast infection?

## 2019-08-13 MED ORDER — FLUCONAZOLE 150 MG PO TABS: 150.0000 mg | ORAL_TABLET | Freq: Once | ORAL | 0 refills | Status: AC

## 2019-08-19 ENCOUNTER — Telehealth: Payer: Self-pay

## 2019-08-19 ENCOUNTER — Telehealth: Payer: Self-pay | Admitting: Family Medicine

## 2019-08-19 ENCOUNTER — Other Ambulatory Visit: Payer: Self-pay | Admitting: Family Medicine

## 2019-08-19 DIAGNOSIS — R300 Dysuria: Secondary | ICD-10-CM

## 2019-08-19 LAB — URINE CYTOLOGY ANCILLARY ONLY
Bacterial Vaginitis-Urine: NEGATIVE
Candida Urine: NEGATIVE
Chlamydia: NEGATIVE
Comment: NEGATIVE
Comment: NEGATIVE
Comment: NORMAL
Neisseria Gonorrhea: NEGATIVE
Trichomonas: NEGATIVE

## 2019-08-19 MED ORDER — FLUCONAZOLE 150 MG PO TABS: 150.0000 mg | ORAL_TABLET | Freq: Once | ORAL | 0 refills | Status: AC

## 2019-08-19 MED FILL — FLUCONAZOLE 150 MG TABS: 150 | 1 days supply | Qty: 1 | Fill #0

## 2019-08-19 NOTE — Telephone Encounter (Signed)
Pt is wanting a return call . Over the counter med is working .Marland Kitchen wants a call with lab results and does she need to taking something prescribed . FYI she stopped taking birth control pills, wants a call back asap  ok to call her at work   [employer]

## 2019-08-19 NOTE — Telephone Encounter (Signed)
Pt wants to know what to expect due to stopping the birth control pills

## 2019-08-20 ENCOUNTER — Telehealth: Payer: Self-pay

## 2019-08-20 NOTE — Telephone Encounter (Signed)
Spoke with pt verbalized understanding of doctor's advice with stopping birth control

## 2019-08-23 NOTE — Telephone Encounter (Signed)
I have called pt back and she stated that it was more an FYI to provider in regards to stopping the birth control. Pt reports that she has been feeling better off of it even though it did give her a cycle.    FYI to provider

## 2019-08-26 MED FILL — LOSARTAN POTASSIUM 50 MG TA: 50 | 30 days supply | Qty: 30 | Fill #4

## 2019-08-26 MED FILL — FLUoxetine HCL 40 MG CAPS: 40 | 30 days supply | Qty: 30 | Fill #1

## 2019-09-20 MED FILL — FLUoxetine HCL 40 MG CAPS: 40 | 30 days supply | Qty: 30 | Fill #2

## 2019-09-20 MED FILL — LARIN FE 1.5-30 TABLET: 1.5-30 | 84 days supply | Qty: 84 | Fill #3

## 2019-09-20 MED FILL — LOSARTAN POTASSIUM 50 MG TA: 50 | 30 days supply | Qty: 30 | Fill #5

## 2019-09-20 MED FILL — HYDROCHLOROTHIAZIDE 25 MG T: 25 | 90 days supply | Qty: 90 | Fill #1

## 2019-10-04 ENCOUNTER — Encounter: Payer: Self-pay | Admitting: Family Medicine

## 2019-10-04 ENCOUNTER — Other Ambulatory Visit: Payer: Self-pay

## 2019-10-04 ENCOUNTER — Ambulatory Visit: Payer: 59 | Admitting: Family Medicine

## 2019-10-04 VITALS — BP 132/84 | HR 74 | Temp 97.6°F | Ht 67.32 in | Wt 261.2 lb

## 2019-10-04 DIAGNOSIS — I10 Essential (primary) hypertension: Secondary | ICD-10-CM

## 2019-10-04 DIAGNOSIS — F411 Generalized anxiety disorder: Secondary | ICD-10-CM | POA: Diagnosis not present

## 2019-10-04 DIAGNOSIS — E785 Hyperlipidemia, unspecified: Secondary | ICD-10-CM | POA: Diagnosis not present

## 2019-10-04 DIAGNOSIS — R7303 Prediabetes: Secondary | ICD-10-CM

## 2019-10-04 MED ORDER — HYDROCHLOROTHIAZIDE 25 MG PO TABS: 25.0000 mg | ORAL_TABLET | Freq: Every day | ORAL | 3 refills | Status: DC

## 2019-10-04 MED ORDER — FLUOXETINE HCL 20 MG PO CAPS: 60.0000 mg | ORAL_CAPSULE | Freq: Every day | ORAL | 3 refills | Status: DC

## 2019-10-04 MED ORDER — AMLODIPINE BESYLATE 10 MG PO TABS: 10.0000 mg | ORAL_TABLET | Freq: Every day | ORAL | 3 refills | Status: DC

## 2019-10-04 MED ORDER — LOSARTAN POTASSIUM 50 MG PO TABS: 50.0000 mg | ORAL_TABLET | Freq: Every day | ORAL | 3 refills | Status: DC

## 2019-10-04 MED FILL — FLUoxetine HCL 20 MG CAPS: 20 | 30 days supply | Qty: 90 | Fill #0

## 2019-10-04 NOTE — Progress Notes (Signed)
12/4/20204:53 PM  Megan Mitchell 09/19/1971, 48 y.o., female [id number]  Chief Complaint  Patient presents with  . Hyperlipidemia  . Hypertension  . Anxiety    wants to increse    HPI:   Patient is a 48 y.o. female with past medical history significant for  HTN, HLP, prediabetes, GAD who presents today for routine followup  She is doing well Stopped OCPs wo any issues Has joined weight watchers - doing well Cont struggle with exercise Anxiety not well controlled - work/covid Gad7=13  Depression screen [facility] 2/9 04/03/2019 01/11/2019 12/25/2018  Decreased Interest 0 0 0  Down, Depressed, Hopeless 0 2 0  PHQ - 2 Score 0 2 0  Altered sleeping - 0 -  Tired, decreased energy - 1 -  Change in appetite - 0 -  Feeling bad or failure about yourself  - 0 -  Trouble concentrating - 0 -  Moving slowly or fidgety/restless - 0 -  Suicidal thoughts - 0 -  PHQ-9 Score - 3 -  Difficult doing work/chores - Not difficult at all -    Fall Risk  10/04/2019 04/03/2019 01/11/2019 12/25/2018 11/30/2018  Falls in the past year? 0 0 0 0 0  Number falls in past yr: 0 0 0 - 1  Injury with Fall? 0 0 0 - 0  Follow up - - Falls evaluation completed Falls evaluation completed -     Allergies  Allergen Reactions  . Sulfa Antibiotics Other (See Comments)    Makes mouth break out with a rash    Prior to Admission medications   Medication Sig Start Date End Date Taking? Authorizing Provider  amLODipine (NORVASC) 10 MG tablet TAKE 1 TABLET (10 MG TOTAL) BY MOUTH DAILY. 07/30/19  Yes Rutherford Guys, MD  cephALEXin (KEFLEX) 500 MG capsule Take 1 capsule (500 mg total) by mouth 2 (two) times daily. 08/08/19  Yes Rutherford Guys, MD  FLUoxetine (PROZAC) 40 MG capsule TAKE 1 CAPSULE BY MOUTH ONCE A DAY 07/29/19  Yes Rutherford Guys, MD  hydrochlorothiazide (HYDRODIURIL) 25 MG tablet TAKE 1 TABLET BY MOUTH ONCE DAILY. 06/28/19  Yes Rutherford Guys, MD  losartan (COZAAR) 50 MG tablet Take 1 tablet (50 mg  total) by mouth daily. 04/17/19  Yes Rutherford Guys, MD    Past Medical History:  Diagnosis Date  . Anemia   . Hypertension     Past Surgical History:  Procedure Laterality Date  . CHOLECYSTECTOMY    . ELBOW SURGERY    . TUBAL LIGATION      Social History   Tobacco Use  . Smoking status: Never Smoker  . Smokeless tobacco: Never Used  Substance Use Topics  . Alcohol use: No    Family History  Problem Relation Age of Onset  . Hypertension Mother     Review of Systems  Constitutional: Negative for chills and fever.  Respiratory: Negative for cough and shortness of breath.   Cardiovascular: Negative for chest pain, palpitations and leg swelling.  Gastrointestinal: Negative for abdominal pain, nausea and vomiting.     OBJECTIVE:  Today's Vitals   10/04/19 1642  BP: 132/84  Pulse: 74  Temp: 97.6 F (36.4 C)  SpO2: 96%  Weight: 261 lb 3.2 oz (118.5 kg)  Height: 5' 7.32" (1.71 m)   Body mass index is 40.52 kg/m.  Wt Readings from Last 3 Encounters:  10/04/19 261 lb 3.2 oz (118.5 kg)  08/06/19 267 lb (121.1 kg)  01/11/19 278  lb 3.2 oz (126.2 kg)    Physical Exam Vitals signs and nursing note reviewed.  Constitutional:      Appearance: She is well-developed.  HENT:     Head: Normocephalic and atraumatic.  Eyes:     General: No scleral icterus.    Conjunctiva/sclera: Conjunctivae normal.     Pupils: Pupils are equal, round, and reactive to light.  Neck:     Musculoskeletal: Neck supple.  Pulmonary:     Effort: Pulmonary effort is normal.  Skin:    General: Skin is warm and dry.  Neurological:     Mental Status: She is alert and oriented to person, place, and time.     No results found for this or any previous visit (from the past 24 hour(s)).  No results found.   ASSESSMENT and PLAN  1. GAD (generalized anxiety disorder) Not controlled. Increase prozac, consider counseling, discussed exercise.  2. Essential hypertension Controlled.  Continue current regime. Cont with LFM - amLODipine (NORVASC) 10 MG tablet; Take 1 tablet (10 mg total) by mouth daily. - hydrochlorothiazide (HYDRODIURIL) 25 MG tablet; Take 1 tablet (25 mg total) by mouth daily.  3. Hyperlipidemia, unspecified hyperlipidemia type Checking labs today, medications will be adjusted as needed.  - Comprehensive metabolic panel - Lipid panel  4. Prediabetes Labs pending. Cont with LFM - Hemoglobin A1c  Other orders - losartan (COZAAR) 50 MG tablet; Take 1 tablet (50 mg total) by mouth daily. - FLUoxetine (PROZAC) 20 MG capsule; Take 3 capsules (60 mg total) by mouth daily.  Return in about 3 months (around 01/02/2020).    Rutherford Guys, MD Primary Care at [place], [place] [address] Ph.  [phone] Fax [phone]

## 2019-10-04 NOTE — Patient Instructions (Signed)
° ° ° °  If you have lab work done today you will be contacted with your lab results within the next 2 weeks.  If you have not heard from us then please contact us. The fastest way to get your results is to register for My Chart. ° ° °IF you received an x-ray today, you will receive an invoice from [facility]. Please contact [facility] at [phone] with questions or concerns regarding your invoice.  ° °IF you received labwork today, you will receive an invoice from [facility]. Please contact [facility] at [phone] with questions or concerns regarding your invoice.  ° °Our billing staff will not be able to assist you with questions regarding bills from these companies. ° °You will be contacted with the lab results as soon as they are available. The fastest way to get your results is to activate your My Chart account. Instructions are located on the last page of this paperwork. If you have not heard from us regarding the results in 2 weeks, please contact this office. °  ° ° ° °

## 2019-10-05 LAB — COMPREHENSIVE METABOLIC PANEL
ALT: 14 IU/L (ref 0–32)
AST: 16 IU/L (ref 0–40)
Albumin/Globulin Ratio: 1.5 (ref 1.2–2.2)
Albumin: 4.5 g/dL (ref 3.8–4.8)
Alkaline Phosphatase: 93 IU/L (ref 39–117)
BUN/Creatinine Ratio: 19 (ref 9–23)
BUN: 13 mg/dL (ref 6–24)
Bilirubin Total: 0.4 mg/dL (ref 0.0–1.2)
CO2: 26 mmol/L (ref 20–29)
Calcium: 10.1 mg/dL (ref 8.7–10.2)
Chloride: 100 mmol/L (ref 96–106)
Creatinine, Ser: 0.67 mg/dL (ref 0.57–1.00)
GFR calc Af Amer: 119 mL/min/{1.73_m2} (ref >59)
GFR calc non Af Amer: 103 mL/min/{1.73_m2} (ref >59)
Globulin, Total: 3 g/dL (ref 1.5–4.5)
Glucose: 76 mg/dL (ref 65–99)
Potassium: 3.6 mmol/L (ref 3.5–5.2)
Sodium: 142 mmol/L (ref 134–144)
Total Protein: 7.5 g/dL (ref 6.0–8.5)

## 2019-10-05 LAB — LIPID PANEL
Chol/HDL Ratio: 3.4 ratio (ref 0.0–4.4)
Cholesterol, Total: 228 mg/dL — ABNORMAL HIGH (ref 100–199)
HDL: 67 mg/dL (ref >39)
LDL Chol Calc (NIH): 146 mg/dL — ABNORMAL HIGH (ref 0–99)
Triglycerides: 86 mg/dL (ref 0–149)
VLDL Cholesterol Cal: 15 mg/dL (ref 5–40)

## 2019-10-05 LAB — HEMOGLOBIN A1C
Est. average glucose Bld gHb Est-mCnc: 120 mg/dL
Hgb A1c MFr Bld: 5.8 % — ABNORMAL HIGH (ref 4.8–5.6)

## 2019-10-09 ENCOUNTER — Telehealth: Payer: Self-pay | Admitting: Family Medicine

## 2019-10-09 NOTE — Telephone Encounter (Signed)
Pt would like a cb concerning her lab results from 07/05/19. Please advise at [phone].

## 2019-10-16 ENCOUNTER — Encounter: Payer: Self-pay | Admitting: *Deleted

## 2019-10-16 NOTE — Telephone Encounter (Signed)
Mychart message sent.

## 2019-10-28 MED FILL — LOSARTAN POTASSIUM 50 MG TA: 50 | 90 days supply | Qty: 90 | Fill #6

## 2019-11-12 MED FILL — AMLODIPINE BESYLATE 10 MG T: 10 | 90 days supply | Qty: 90 | Fill #1

## 2019-11-27 MED FILL — FLUoxetine HCL 20 MG CAPS: 20 | 30 days supply | Qty: 90 | Fill #1

## 2019-12-26 MED FILL — FLUoxetine HCL 20 MG CAPS: 20 | 30 days supply | Qty: 90 | Fill #2

## 2019-12-26 MED FILL — HYDROCHLOROTHIAZIDE 25 MG T: 25 | 90 days supply | Qty: 90 | Fill #2

## 2020-01-02 ENCOUNTER — Ambulatory Visit: Payer: 59 | Admitting: Family Medicine

## 2020-01-02 ENCOUNTER — Other Ambulatory Visit: Payer: Self-pay

## 2020-01-02 ENCOUNTER — Encounter: Payer: Self-pay | Admitting: Family Medicine

## 2020-01-02 VITALS — BP 134/80 | HR 69 | Temp 98.2°F | Wt 258.8 lb

## 2020-01-02 DIAGNOSIS — I10 Essential (primary) hypertension: Secondary | ICD-10-CM | POA: Diagnosis not present

## 2020-01-02 DIAGNOSIS — E785 Hyperlipidemia, unspecified: Secondary | ICD-10-CM

## 2020-01-02 DIAGNOSIS — F411 Generalized anxiety disorder: Secondary | ICD-10-CM | POA: Diagnosis not present

## 2020-01-02 MED ORDER — CITALOPRAM HYDROBROMIDE 40 MG PO TABS: 40.0000 mg | ORAL_TABLET | Freq: Every day | ORAL | 3 refills | Status: DC

## 2020-01-02 MED FILL — CITALOPRAM HBR 40 MG TABLET: 40 | 30 days supply | Qty: 30 | Fill #0

## 2020-01-02 NOTE — Patient Instructions (Addendum)
  [place] [id number] - [id number]    If you have lab work done today you will be contacted with your lab results within the next 2 weeks.  If you have not heard from [place] then please contact us. The fastest way to get your results is to register for My Chart.   IF you received an x-ray today, you will receive an invoice from [facility]. Please contact [facility] at [phone] with questions or concerns regarding your invoice.   IF you received labwork today, you will receive an invoice from [facility]. Please contact [facility] at [phone] with questions or concerns regarding your invoice.   Our billing staff will not be able to assist you with questions regarding bills from these companies.  You will be contacted with the lab results as soon as they are available. The fastest way to get your results is to activate your My Chart account. Instructions are located on the last page of this paperwork. If you have not heard from [place] regarding the results in 2 weeks, please contact this office.

## 2020-01-02 NOTE — Progress Notes (Signed)
3/4/20215:15 PM  Megan Mitchell 05/21/1971, 49 y.o., female [id number]  Chief Complaint  Patient presents with  . Hypertension    HPI:   Patient is a 49 y.o. female with past medical history significant for HTN, HLP, prediabetes, GADwho presents today for routine followup  Last OV dec 2020  Increased prozac to 60mg  daily Feels anxiety is still not well controlled Has been thinking more about counseling She otherwise continues to work on LFM and weight loss Taking meds as prescribed  Denies any acute concerns  Depression screen [facility] 2/9 01/02/2020 04/03/2019 01/11/2019  Decreased Interest 0 0 0  Down, Depressed, Hopeless 0 0 2  PHQ - 2 Score 0 0 2  Altered sleeping - - 0  Tired, decreased energy - - 1  Change in appetite - - 0  Feeling bad or failure about yourself  - - 0  Trouble concentrating - - 0  Moving slowly or fidgety/restless - - 0  Suicidal thoughts - - 0  PHQ-9 Score - - 3  Difficult doing work/chores - - Not difficult at all    Fall Risk  01/02/2020 10/04/2019 04/03/2019 01/11/2019 12/25/2018  Falls in the past year? 0 0 0 0 0  Number falls in past yr: 0 0 0 0 -  Injury with Fall? 0 0 0 0 -  Follow up - - - Falls evaluation completed Falls evaluation completed     Allergies  Allergen Reactions  . Sulfa Antibiotics Other (See Comments)    Makes mouth break out with a rash    Prior to Admission medications   Medication Sig Start Date End Date Taking? Authorizing Provider  amLODipine (NORVASC) 10 MG tablet Take 1 tablet (10 mg total) by mouth daily. 10/04/19  Yes Rutherford Guys, MD  FLUoxetine (PROZAC) 20 MG capsule Take 3 capsules (60 mg total) by mouth daily. 10/04/19  Yes Rutherford Guys, MD  hydrochlorothiazide (HYDRODIURIL) 25 MG tablet Take 1 tablet (25 mg total) by mouth daily. 10/04/19  Yes Rutherford Guys, MD  losartan (COZAAR) 50 MG tablet Take 1 tablet (50 mg total) by mouth daily. 10/04/19  Yes Rutherford Guys, MD    Past Medical History:    Diagnosis Date  . Anemia   . Hypertension     Past Surgical History:  Procedure Laterality Date  . CHOLECYSTECTOMY    . ELBOW SURGERY    . TUBAL LIGATION      Social History   Tobacco Use  . Smoking status: Never Smoker  . Smokeless tobacco: Never Used  Substance Use Topics  . Alcohol use: No    Family History  Problem Relation Age of Onset  . Hypertension Mother     Review of Systems  Constitutional: Negative for chills and fever.  Respiratory: Negative for cough and shortness of breath.   Cardiovascular: Negative for chest pain, palpitations and leg swelling.  Gastrointestinal: Negative for abdominal pain, nausea and vomiting.     OBJECTIVE:  Today's Vitals   01/02/20 1707  BP: 134/80  Pulse: 69  Temp: 98.2 F (36.8 C)  SpO2: 99%  Weight: 258 lb 12.8 oz (117.4 kg)   Body mass index is 40.15 kg/m.  Wt Readings from Last 3 Encounters:  01/02/20 258 lb 12.8 oz (117.4 kg)  10/04/19 261 lb 3.2 oz (118.5 kg)  08/06/19 267 lb (121.1 kg)    Physical Exam Vitals and nursing note reviewed.  Constitutional:      Appearance: She is well-developed.  HENT:     Head: Normocephalic and atraumatic.     Mouth/Throat:     Pharynx: No oropharyngeal exudate.  Eyes:     General: No scleral icterus.    Conjunctiva/sclera: Conjunctivae normal.     Pupils: Pupils are equal, round, and reactive to light.  Cardiovascular:     Rate and Rhythm: Normal rate and regular rhythm.     Heart sounds: Normal heart sounds. No murmur. No friction rub. No gallop.   Pulmonary:     Effort: Pulmonary effort is normal.     Breath sounds: Normal breath sounds. No wheezing or rales.  Musculoskeletal:     Cervical back: Neck supple.  Skin:    General: Skin is warm and dry.  Neurological:     Mental Status: She is alert and oriented to person, place, and time.     No results found for this or any previous visit (from the past 24 hour(s)).  No results found.   ASSESSMENT and  PLAN  1. GAD (generalized anxiety disorder) Not controlled. Discussed treatment options, decided to trial of different SSRI, will try celexa, reviewed r/se/b. Provided contact info for counseling  2. Hyperlipidemia, unspecified hyperlipidemia type Cont working on LFM  3. Essential hypertension Controlled. Continue current regime.   Other orders - citalopram (CELEXA) 40 MG tablet; Take 1 tablet (40 mg total) by mouth daily.  Return in about 3 months (around 04/03/2020).    Rutherford Guys, MD Primary Care at [place], [place] [address] Ph.  [phone] Fax [phone]

## 2020-01-08 MED FILL — IBUPROFEN 400 MG TABS: 400 | 5 days supply | Qty: 30 | Fill #0

## 2020-01-22 MED FILL — LOSARTAN POTASSIUM 50 MG TA: 50 | 90 days supply | Qty: 90 | Fill #7

## 2020-01-27 MED FILL — CITALOPRAM HBR 40 MG TABLET: 40 | 30 days supply | Qty: 30 | Fill #1

## 2020-02-17 MED FILL — AMLODIPINE BESYLATE 10 MG T: 10 | 90 days supply | Qty: 90 | Fill #2

## 2020-02-26 ENCOUNTER — Telehealth: Payer: 59 | Admitting: Adult Health Nurse Practitioner

## 2020-02-26 ENCOUNTER — Other Ambulatory Visit: Payer: Self-pay

## 2020-02-26 ENCOUNTER — Telehealth: Payer: Self-pay | Admitting: Family Medicine

## 2020-02-26 NOTE — Telephone Encounter (Signed)
Patient is calling because she has gas and cramps. wondering if she would be willing to call something in for her . Patient will be at work until [employer] is [phone] After 3:30 call her cell

## 2020-02-26 NOTE — Telephone Encounter (Signed)
Put patient on the scheduled for Virtual for today.

## 2020-02-27 ENCOUNTER — Telehealth (INDEPENDENT_AMBULATORY_CARE_PROVIDER_SITE_OTHER): Payer: 59 | Admitting: Emergency Medicine

## 2020-02-27 ENCOUNTER — Encounter: Payer: Self-pay | Admitting: Emergency Medicine

## 2020-02-27 DIAGNOSIS — R141 Gas pain: Secondary | ICD-10-CM | POA: Diagnosis not present

## 2020-02-27 DIAGNOSIS — R140 Abdominal distension (gaseous): Secondary | ICD-10-CM | POA: Diagnosis not present

## 2020-02-27 DIAGNOSIS — R109 Unspecified abdominal pain: Secondary | ICD-10-CM

## 2020-02-27 NOTE — Progress Notes (Signed)
Telemedicine Encounter- SOAP NOTE Established Patient MyChart video conference This video telephone encounter was conducted with the patient's (or proxy's) verbal consent via video audio telecommunications: yes/no: Yes Patient was instructed to have this encounter in a suitably private space; and to only have persons present to whom they give permission to participate. In addition, patient identity was confirmed by use of name plus two identifiers (DOB and address).  I discussed the limitations, risks, security and privacy concerns of performing an evaluation and management service by telephone and the availability of in person appointments. I also discussed with the patient that there may be a patient responsible charge related to this service. The patient expressed understanding and agreed to proceed.  I spent a total of TIME; 0 MIN TO 60 MIN: 15 minutes talking with the patient or their proxy.  Chief Complaint  Patient presents with  . GI Problem    pt beleives she has a stomach virus. Says coworkers are complaining of the same sx.  Having bloating, cramping, passing gas and sounds coming from the stomach. Pt beleives she began to feel the gi concerns when she ate the spicy chex mix. She is taking gax X for her sx as of right now    Subjective   Megan Mitchell is a 49 y.o. female established patient. Telephone visit today complaining of abdominal pain that started 2 days ago around 4 PM.  Came back from work and not feeling very well and developed diffuse abdominal cramping with bloating and increased gas sensation.  Denies nausea or vomiting.  Denies diarrhea.  Today feeling better than 2 days ago, about 50% better.  Able to eat and drink.  Denies fever chills or flulike symptoms.  No other associated symptomatology.  Took some Pepto-Bismol with some relief. Has history of cholecystectomy.  HPI   Patient Active Problem List   Diagnosis Date Noted  . Anemia 12/26/2014  . HTN  (hypertension) 03/22/2014  . Severe obesity (BMI >= 40) ([place]) 03/22/2014    Past Medical History:  Diagnosis Date  . Anemia   . Hypertension     Current Outpatient Medications  Medication Sig Dispense Refill  . amLODipine (NORVASC) 10 MG tablet Take 1 tablet (10 mg total) by mouth daily. 90 tablet 3  . citalopram (CELEXA) 40 MG tablet Take 1 tablet (40 mg total) by mouth daily. 30 tablet 3  . hydrochlorothiazide (HYDRODIURIL) 25 MG tablet Take 1 tablet (25 mg total) by mouth daily. 90 tablet 3  . losartan (COZAAR) 50 MG tablet Take 1 tablet (50 mg total) by mouth daily. 90 tablet 3   No current facility-administered medications for this visit.    Allergies  Allergen Reactions  . Sulfa Antibiotics Other (See Comments)    Makes mouth break out with a rash    Social History   Socioeconomic History  . Marital status: Divorced    Spouse name: Not on file  . Number of children: 2  . Years of education: Not on file  . Highest education level: Not on file  Occupational History  . Not on file  Tobacco Use  . Smoking status: Never Smoker  . Smokeless tobacco: Never Used  Substance and Sexual Activity  . Alcohol use: No  . Drug use: No  . Sexual activity: Yes    Birth control/protection: Pill  Other Topics Concern  . Not on file  Social History Narrative  . Not on file   Social Determinants of Health  Financial Resource Strain:   . Difficulty of Paying Living Expenses:   Food Insecurity:   . Worried About Charity fundraiser in the Last Year:   . Arboriculturist in the Last Year:   Transportation Needs:   . Film/video editor (Medical):   Marland Kitchen Lack of Transportation (Non-Medical):   Physical Activity:   . Days of Exercise per Week:   . Minutes of Exercise per Session:   Stress:   . Feeling of Stress :   Social Connections:   . Frequency of Communication with Friends and Family:   . Frequency of Social Gatherings with Friends and Family:   . Attends Religious  Services:   . Active Member of Clubs or Organizations:   . Attends Archivist Meetings:   Marland Kitchen Marital Status:   Intimate Partner Violence:   . Fear of Current or Ex-Partner:   . Emotionally Abused:   Marland Kitchen Physically Abused:   . Sexually Abused:     Review of Systems  Constitutional: Negative.  Negative for chills and fever.  HENT: Negative.  Negative for congestion and sore throat.   Respiratory: Negative.  Negative for cough and shortness of breath.   Cardiovascular: Negative.  Negative for chest pain and palpitations.  Gastrointestinal: Positive for abdominal pain. Negative for blood in stool, constipation, diarrhea and melena.       Bloating and increased gas  Genitourinary: Negative.  Negative for dysuria, flank pain, frequency and hematuria.  Musculoskeletal: Negative.  Negative for back pain, myalgias and neck pain.  Skin: Negative.  Negative for rash.  Neurological: Negative for dizziness and headaches.  All other systems reviewed and are negative.   Objective  Alert and oriented x3 in no apparent respiratory distress. Vitals as reported by the patient: There were no vitals filed for this visit.  There are no diagnoses linked to this encounter. Megan Mitchell was seen today for gi problem.  Diagnoses and all orders for this visit:  Abdominal cramping  Abdominal gas pain  Bloating  No red flag signs or symptoms.  Clinically stable.  Afebrile and able to eat and drink.  Advised to take Mylanta or Maalox as needed.  Tylenol as needed. Advised to contact the office if no better or worse during the next several days.  ED precautions given.   I discussed the assessment and treatment plan with the patient. The patient was provided an opportunity to ask questions and all were answered. The patient agreed with the plan and demonstrated an understanding of the instructions.   The patient was advised to call back or seek an in-person evaluation if the symptoms worsen or if the  condition fails to improve as anticipated.  I provided 15 minutes of non-face-to-face time during this encounter.  Horald Pollen, MD  Primary Care at [facility]

## 2020-02-28 ENCOUNTER — Other Ambulatory Visit: Payer: Self-pay

## 2020-02-28 ENCOUNTER — Telehealth: Payer: Self-pay | Admitting: Family Medicine

## 2020-02-28 DIAGNOSIS — I10 Essential (primary) hypertension: Secondary | ICD-10-CM

## 2020-02-28 DIAGNOSIS — E785 Hyperlipidemia, unspecified: Secondary | ICD-10-CM

## 2020-02-28 NOTE — Telephone Encounter (Signed)
Pt is having her cpe on 03/26/20 and she has a nurse visit for labs for her cpe on 03/24/20. Please put orders in for her labs.

## 2020-02-28 NOTE — Telephone Encounter (Signed)
Orders have been placed for cpe

## 2020-02-28 NOTE — Telephone Encounter (Signed)
Pt called and made an annual physical appt on 03/26/20. Pt stated she will cb to look at her sch to see her availablity to do her lab work before her physical appt. Please advise.

## 2020-03-23 ENCOUNTER — Telehealth: Payer: Self-pay | Admitting: Family Medicine

## 2020-03-23 NOTE — Telephone Encounter (Signed)
Patient is calling to reschedule CPE from 03/26/20 to 03/25/20. Other wise she will keep 03/26/20 if nothing is available on Wednesday 03/25/20 Cb- [id number] Leave VM Private Voice mail.

## 2020-03-23 NOTE — Telephone Encounter (Signed)
Please see patient's previous message request to reschedule CPE appt for 5/26. thanks

## 2020-03-23 NOTE — Telephone Encounter (Signed)
Called pt regarding her wanting to change CPE for 5/26 instead for having it on original 5/27. Looked at providers sch and provider is off that day.

## 2020-03-24 ENCOUNTER — Other Ambulatory Visit: Payer: Self-pay

## 2020-03-24 ENCOUNTER — Ambulatory Visit: Payer: 59

## 2020-03-24 DIAGNOSIS — E785 Hyperlipidemia, unspecified: Secondary | ICD-10-CM | POA: Diagnosis not present

## 2020-03-24 DIAGNOSIS — I10 Essential (primary) hypertension: Secondary | ICD-10-CM

## 2020-03-25 LAB — CMP14+EGFR
ALT: 11 IU/L (ref 0–32)
AST: 16 IU/L (ref 0–40)
Albumin/Globulin Ratio: 1.4 (ref 1.2–2.2)
Albumin: 4.4 g/dL (ref 3.8–4.8)
Alkaline Phosphatase: 83 IU/L (ref 48–121)
BUN/Creatinine Ratio: 18 (ref 9–23)
BUN: 12 mg/dL (ref 6–24)
Bilirubin Total: 0.6 mg/dL (ref 0.0–1.2)
CO2: 27 mmol/L (ref 20–29)
Calcium: 10 mg/dL (ref 8.7–10.2)
Chloride: 98 mmol/L (ref 96–106)
Creatinine, Ser: 0.68 mg/dL (ref 0.57–1.00)
GFR calc Af Amer: 118 mL/min/{1.73_m2} (ref >59)
GFR calc non Af Amer: 102 mL/min/{1.73_m2} (ref >59)
Globulin, Total: 3.1 g/dL (ref 1.5–4.5)
Glucose: 76 mg/dL (ref 65–99)
Potassium: 3.4 mmol/L — ABNORMAL LOW (ref 3.5–5.2)
Sodium: 137 mmol/L (ref 134–144)
Total Protein: 7.5 g/dL (ref 6.0–8.5)

## 2020-03-25 LAB — LIPID PANEL
Chol/HDL Ratio: 3 ratio (ref 0.0–4.4)
Cholesterol, Total: 204 mg/dL — ABNORMAL HIGH (ref 100–199)
HDL: 67 mg/dL (ref >39)
LDL Chol Calc (NIH): 125 mg/dL — ABNORMAL HIGH (ref 0–99)
Triglycerides: 68 mg/dL (ref 0–149)
VLDL Cholesterol Cal: 12 mg/dL (ref 5–40)

## 2020-03-25 LAB — TSH: TSH: 1.68 u[IU]/mL (ref 0.450–4.500)

## 2020-03-25 LAB — HEMOGLOBIN A1C
Est. average glucose Bld gHb Est-mCnc: 114 mg/dL
Hgb A1c MFr Bld: 5.6 % (ref 4.8–5.6)

## 2020-03-26 ENCOUNTER — Ambulatory Visit (INDEPENDENT_AMBULATORY_CARE_PROVIDER_SITE_OTHER): Payer: 59 | Admitting: Family Medicine

## 2020-03-26 ENCOUNTER — Encounter: Payer: Self-pay | Admitting: Family Medicine

## 2020-03-26 ENCOUNTER — Other Ambulatory Visit: Payer: Self-pay

## 2020-03-26 ENCOUNTER — Other Ambulatory Visit: Payer: Self-pay | Admitting: Family Medicine

## 2020-03-26 VITALS — BP 116/73 | HR 79 | Temp 98.1°F | Ht 67.32 in | Wt 268.0 lb

## 2020-03-26 DIAGNOSIS — Z23 Encounter for immunization: Secondary | ICD-10-CM | POA: Diagnosis not present

## 2020-03-26 DIAGNOSIS — Z1211 Encounter for screening for malignant neoplasm of colon: Secondary | ICD-10-CM | POA: Diagnosis not present

## 2020-03-26 DIAGNOSIS — I10 Essential (primary) hypertension: Secondary | ICD-10-CM | POA: Diagnosis not present

## 2020-03-26 DIAGNOSIS — Z0000 Encounter for general adult medical examination without abnormal findings: Secondary | ICD-10-CM

## 2020-03-26 DIAGNOSIS — Z0001 Encounter for general adult medical examination with abnormal findings: Secondary | ICD-10-CM | POA: Diagnosis not present

## 2020-03-26 MED ORDER — POTASSIUM CHLORIDE CRYS ER 20 MEQ PO TBCR
20.0000 meq | EXTENDED_RELEASE_TABLET | Freq: Every day | ORAL | 0 refills | Status: DC
Start: 2020-03-26 — End: 2020-08-11

## 2020-03-26 MED ORDER — LOSARTAN POTASSIUM 50 MG PO TABS: 50.0000 mg | ORAL_TABLET | Freq: Every day | ORAL | 3 refills | Status: DC

## 2020-03-26 MED ORDER — AMLODIPINE BESYLATE 10 MG PO TABS: 10.0000 mg | ORAL_TABLET | Freq: Every day | ORAL | 3 refills | Status: DC

## 2020-03-26 MED ORDER — HYDROCHLOROTHIAZIDE 25 MG PO TABS: 25.0000 mg | ORAL_TABLET | Freq: Every day | ORAL | 3 refills | Status: DC

## 2020-03-26 MED FILL — POTASSIUM CHLORIDE CRYS ER: 20 | 4 days supply | Qty: 4 | Fill #0

## 2020-03-26 MED FILL — HYDROCHLOROTHIAZIDE 25 MG T: 25 | 90 days supply | Qty: 90 | Fill #0

## 2020-03-26 NOTE — Patient Instructions (Addendum)
Please return in 2 months for shingrix #2   Preventive Care 51-51 Years Old, Female Preventive care refers to visits with your health care provider and lifestyle choices that can promote health and wellness. This includes:  A yearly physical exam. This may also be called an annual well check.  Regular dental visits and eye exams.  Immunizations.  Screening for certain conditions.  Healthy lifestyle choices, such as eating a healthy diet, getting regular exercise, not using drugs or products that contain nicotine and tobacco, and limiting alcohol use. What can I expect for my preventive care visit? Physical exam Your health care provider will check your:  Height and weight. This may be used to calculate body mass index (BMI), which tells if you are at a healthy weight.  Heart rate and blood pressure.  Skin for abnormal spots. Counseling Your health care provider may ask you questions about your:  Alcohol, tobacco, and drug use.  Emotional well-being.  Home and relationship well-being.  Sexual activity.  Eating habits.  Work and work Statistician.  Method of birth control.  Menstrual cycle.  Pregnancy history. What immunizations do I need?  Influenza (flu) vaccine  This is recommended every year. Tetanus, diphtheria, and pertussis (Tdap) vaccine  You may need a Td booster every 10 years. Varicella (chickenpox) vaccine  You may need this if you have not been vaccinated. Zoster (shingles) vaccine  You may need this after age 51. Measles, mumps, and rubella (MMR) vaccine  You may need at least one dose of MMR if you were born in 1957 or later. You may also need a second dose. Pneumococcal conjugate (PCV13) vaccine  You may need this if you have certain conditions and were not previously vaccinated. Pneumococcal polysaccharide (PPSV23) vaccine  You may need one or two doses if you smoke cigarettes or if you have certain conditions. Meningococcal conjugate  (MenACWY) vaccine  You may need this if you have certain conditions. Hepatitis A vaccine  You may need this if you have certain conditions or if you travel or work in places where you may be exposed to hepatitis A. Hepatitis B vaccine  You may need this if you have certain conditions or if you travel or work in places where you may be exposed to hepatitis B. Haemophilus influenzae type b (Hib) vaccine  You may need this if you have certain conditions. Human papillomavirus (HPV) vaccine  If recommended by your health care provider, you may need three doses over 6 months. You may receive vaccines as individual doses or as more than one vaccine together in one shot (combination vaccines). Talk with your health care provider about the risks and benefits of combination vaccines. What tests do I need? Blood tests  Lipid and cholesterol levels. These may be checked every 5 years, or more frequently if you are over 45 years old.  Hepatitis C test.  Hepatitis B test. Screening  Lung cancer screening. You may have this screening every year starting at age 40 if you have a 30-pack-year history of smoking and currently smoke or have quit within the past 15 years.  Colorectal cancer screening. All adults should have this screening starting at age 36 and continuing until age 83. Your health care provider may recommend screening at age 64 if you are at increased risk. You will have tests every 1-10 years, depending on your results and the type of screening test.  Diabetes screening. This is done by checking your blood sugar (glucose) after you have  not eaten for a while (fasting). You may have this done every 1-3 years.  Mammogram. This may be done every 1-2 years. Talk with your health care provider about when you should start having regular mammograms. This may depend on whether you have a family history of breast cancer.  BRCA-related cancer screening. This may be done if you have a family  history of breast, ovarian, tubal, or peritoneal cancers.  Pelvic exam and Pap test. This may be done every 3 years starting at age 42. Starting at age 51, this may be done every 5 years if you have a Pap test in combination with an HPV test. Other tests  Sexually transmitted disease (STD) testing.  Bone density scan. This is done to screen for osteoporosis. You may have this scan if you are at high risk for osteoporosis. Follow these instructions at home: Eating and drinking  Eat a diet that includes fresh fruits and vegetables, whole grains, lean protein, and low-fat dairy.  Take vitamin and mineral supplements as recommended by your health care provider.  Do not drink alcohol if: ? Your health care provider tells you not to drink. ? You are pregnant, may be pregnant, or are planning to become pregnant.  If you drink alcohol: ? Limit how much you have to 0-1 drink a day. ? Be aware of how much alcohol is in your drink. In the [place] one drink equals one 12 oz bottle of beer (355 mL), one 5 oz glass of wine (148 mL), or one 1 oz glass of hard liquor (44 mL). Lifestyle  Take daily care of your teeth and gums.  Stay active. Exercise for at least 30 minutes on 5 or more days each week.  Do not use any products that contain nicotine or tobacco, such as cigarettes, e-cigarettes, and chewing tobacco. If you need help quitting, ask your health care provider.  If you are sexually active, practice safe sex. Use a condom or other form of birth control (contraception) in order to prevent pregnancy and STIs (sexually transmitted infections).  If told by your health care provider, take low-dose aspirin daily starting at age 62. What's next?  Visit your health care provider once a year for a well check visit.  Ask your health care provider how often you should have your eyes and teeth checked.  Stay up to date on all vaccines. This information is not intended to replace advice given to you  by your health care provider. Make sure you discuss any questions you have with your health care provider. Document Revised: 06/28/2018 Document Reviewed: 06/28/2018 Elsevier Patient Education  [employer].   If you have lab work done today you will be contacted with your lab results within the next 2 weeks.  If you have not heard from [place] then please contact us. The fastest way to get your results is to register for My Chart.   IF you received an x-ray today, you will receive an invoice from [facility]. Please contact [facility] at [phone] with questions or concerns regarding your invoice.   IF you received labwork today, you will receive an invoice from [facility]. Please contact [facility] at [phone] with questions or concerns regarding your invoice.   Our billing staff will not be able to assist you with questions regarding bills from these companies.  You will be contacted with the lab results as soon as they are available. The fastest way to get your results is to activate your My  Chart account. Instructions are located on the last page of this paperwork. If you have not heard from [place] regarding the results in 2 weeks, please contact this office.

## 2020-03-26 NOTE — Progress Notes (Signed)
5/27/20213:43 PM  Megan Mitchell 1971-02-19, 49 y.o., female [id number]  Chief Complaint  Patient presents with  . Annual Exam    feels she no longer needs the anxiety medicaiton    HPI:   Patient is a 49 y.o. female with past medical history significant for HTN who presents today for CPE  Cervical Cancer Screening: per obgyn  Dr Kendall Flack, [place] [facility] Breast Cancer Screening:per obgyn Colorectal Cancer Screening: due to today, denies fhx Bone Density Testing: at age 51 HIV Screening: 2019   Most Recent Immunizations  Administered Date(s) Administered  . Influenza Inj Mdck Quad Pf 06/25/2019  . Influenza,inj,Quad PF,6+ Mos 06/22/2018  . Influenza-Unspecified 07/31/2016  . PFIZER SARS-COV-2 Vaccination 01/22/2020  . Tdap 06/22/2018   Zoster Vaccination: due today Frequency of Dental evaluation: 2-3 times a year Frequency of Eye evaluation: had eye exam, now wearing glasses for reading/computer  Walks 2.5 miles 3 x week Doing weight watchers  Health Maintenance Due  Topic Date Due  . PAP SMEAR-Modifier  07/02/2019   The 10-year ASCVD risk score Mikey Bussing DC Jr., et al., 2013) is: 1.8%  Labs reviewed   Depression screen [facility] 2/9 03/26/2020 02/26/2020 01/02/2020  Decreased Interest 0 0 0  Down, Depressed, Hopeless 0 0 0  PHQ - 2 Score 0 0 0  Altered sleeping - - -  Tired, decreased energy - - -  Change in appetite - - -  Feeling bad or failure about yourself  - - -  Trouble concentrating - - -  Moving slowly or fidgety/restless - - -  Suicidal thoughts - - -  PHQ-9 Score - - -  Difficult doing work/chores - - -    Fall Risk  03/26/2020 02/26/2020 01/02/2020 10/04/2019 04/03/2019  Falls in the past year? 0 0 0 0 0  Number falls in past yr: 0 - 0 0 0  Injury with Fall? 0 - 0 0 0  Follow up - Falls evaluation completed - - -     Allergies  Allergen Reactions  . Sulfa Antibiotics Other (See Comments)    Makes mouth break out with a rash    Prior to  Admission medications   Medication Sig Start Date End Date Taking? Authorizing Provider  amLODipine (NORVASC) 10 MG tablet Take 1 tablet (10 mg total) by mouth daily. 10/04/19  Yes Rutherford Guys, MD  hydrochlorothiazide (HYDRODIURIL) 25 MG tablet Take 1 tablet (25 mg total) by mouth daily. 10/04/19  Yes Rutherford Guys, MD  losartan (COZAAR) 50 MG tablet Take 1 tablet (50 mg total) by mouth daily. 10/04/19  Yes Rutherford Guys, MD    Past Medical History:  Diagnosis Date  . Anemia   . Hypertension     Past Surgical History:  Procedure Laterality Date  . CHOLECYSTECTOMY    . ELBOW SURGERY    . TUBAL LIGATION      Social History   Tobacco Use  . Smoking status: Never Smoker  . Smokeless tobacco: Never Used  Substance Use Topics  . Alcohol use: No    Family History  Problem Relation Age of Onset  . Hypertension Mother     Review of Systems  Constitutional: Negative for chills and fever.  Respiratory: Negative for cough and shortness of breath.   Cardiovascular: Negative for chest pain, palpitations and leg swelling.  Gastrointestinal: Negative for abdominal pain, nausea and vomiting.  All other systems reviewed and are negative.    OBJECTIVE:  Today's Vitals  03/26/20 1536  BP: 116/73  Pulse: 79  Temp: 98.1 F (36.7 C)  SpO2: 100%  Weight: 268 lb (121.6 kg)  Height: 5' 7.32" (1.71 m)   Body mass index is 41.58 kg/m.   Wt Readings from Last 3 Encounters:  03/26/20 268 lb (121.6 kg)  01/02/20 258 lb 12.8 oz (117.4 kg)  10/04/19 261 lb 3.2 oz (118.5 kg)     Physical Exam Vitals and nursing note reviewed.  Constitutional:      Appearance: She is well-developed.  HENT:     Head: Normocephalic and atraumatic.     Right Ear: Hearing, tympanic membrane, ear canal and external ear normal.     Left Ear: Hearing, tympanic membrane, ear canal and external ear normal.     Mouth/Throat:     Mouth: Mucous membranes are moist.     Pharynx: No oropharyngeal  exudate or posterior oropharyngeal erythema.  Eyes:     Extraocular Movements: Extraocular movements intact.     Conjunctiva/sclera: Conjunctivae normal.     Pupils: Pupils are equal, round, and reactive to light.  Neck:     Thyroid: No thyromegaly.  Cardiovascular:     Rate and Rhythm: Normal rate and regular rhythm.     Heart sounds: Normal heart sounds. No murmur. No friction rub. No gallop.   Pulmonary:     Effort: Pulmonary effort is normal.     Breath sounds: Normal breath sounds. No wheezing, rhonchi or rales.  Abdominal:     General: Bowel sounds are normal. There is no distension.     Palpations: Abdomen is soft. There is no hepatomegaly, splenomegaly or mass.     Tenderness: There is no abdominal tenderness.  Musculoskeletal:        General: Normal range of motion.     Cervical back: Neck supple.     Right lower leg: No edema.     Left lower leg: No edema.  Lymphadenopathy:     Cervical: No cervical adenopathy.  Skin:    General: Skin is warm and dry.  Neurological:     Mental Status: She is alert and oriented to person, place, and time.     Cranial Nerves: No cranial nerve deficit.     Gait: Gait normal.     Deep Tendon Reflexes: Reflexes are normal and symmetric.  Psychiatric:        Mood and Affect: Mood normal.        Behavior: Behavior normal.     No results found for this or any previous visit (from the past 24 hour(s)).  No results found.   ASSESSMENT and PLAN  1. Annual physical exam No concerns per history or exam. HCM reviewed/discussed. Anticipatory guidance regarding healthy weight, lifestyle and choices given.   2. Essential hypertension Controlled. Continue current regime. Supplement KCL, discussed high K foods - amLODipine (NORVASC) 10 MG tablet; Take 1 tablet (10 mg total) by mouth daily. - hydrochlorothiazide (HYDRODIURIL) 25 MG tablet; Take 1 tablet (25 mg total) by mouth daily.  3. Colon cancer screening - IFOBT POC (occult bld, rslt  in office); Future  4. Need for vaccination shingrix #1 given today. Return nurse visit for shingrix #2  Other orders - potassium chloride SA (KLOR-CON) 20 MEQ tablet; Take 1 tablet (20 mEq total) by mouth daily. - losartan (COZAAR) 50 MG tablet; Take 1 tablet (50 mg total) by mouth daily. - Varicella-zoster vaccine IM  Return in about 6 months (around 09/26/2020).    Lilia Argue  Pamella Pert, MD Primary Care at [facility] [place], [place] [address] Ph.  [phone] Fax [phone]

## 2020-04-02 ENCOUNTER — Ambulatory Visit: Payer: 59 | Admitting: Family Medicine

## 2020-04-07 ENCOUNTER — Encounter: Payer: Self-pay | Admitting: Registered Nurse

## 2020-04-07 ENCOUNTER — Ambulatory Visit (INDEPENDENT_AMBULATORY_CARE_PROVIDER_SITE_OTHER): Payer: 59

## 2020-04-07 ENCOUNTER — Ambulatory Visit: Payer: 59 | Admitting: Registered Nurse

## 2020-04-07 ENCOUNTER — Other Ambulatory Visit: Payer: Self-pay

## 2020-04-07 VITALS — BP 129/82 | HR 68 | Temp 97.5°F | Resp 17 | Ht 67.0 in | Wt 270.6 lb

## 2020-04-07 DIAGNOSIS — M25512 Pain in left shoulder: Secondary | ICD-10-CM | POA: Diagnosis not present

## 2020-04-07 MED ORDER — METHOCARBAMOL 500 MG PO TABS: 500.0000 mg | ORAL_TABLET | Freq: Four times a day (QID) | ORAL | 0 refills | Status: DC

## 2020-04-07 MED ORDER — TRAMADOL HCL 50 MG PO TABS: 50.0000 mg | ORAL_TABLET | Freq: Three times a day (TID) | ORAL | 0 refills | Status: AC | PRN

## 2020-04-07 MED ORDER — MELOXICAM 15 MG PO TABS: 15.0000 mg | ORAL_TABLET | Freq: Every day | ORAL | 0 refills | Status: DC

## 2020-04-07 NOTE — Patient Instructions (Signed)
° ° ° °  If you have lab work done today you will be contacted with your lab results within the next 2 weeks.  If you have not heard from us then please contact us. The fastest way to get your results is to register for My Chart. ° ° °IF you received an x-ray today, you will receive an invoice from [facility]. Please contact [facility] at [phone] with questions or concerns regarding your invoice.  ° °IF you received labwork today, you will receive an invoice from [facility]. Please contact [facility] at [phone] with questions or concerns regarding your invoice.  ° °Our billing staff will not be able to assist you with questions regarding bills from these companies. ° °You will be contacted with the lab results as soon as they are available. The fastest way to get your results is to activate your My Chart account. Instructions are located on the last page of this paperwork. If you have not heard from us regarding the results in 2 weeks, please contact this office. °  ° ° ° °

## 2020-04-28 ENCOUNTER — Other Ambulatory Visit: Payer: Self-pay

## 2020-04-28 ENCOUNTER — Ambulatory Visit: Payer: 59 | Admitting: Family Medicine

## 2020-04-28 ENCOUNTER — Encounter: Payer: Self-pay | Admitting: Family Medicine

## 2020-04-28 ENCOUNTER — Ambulatory Visit (INDEPENDENT_AMBULATORY_CARE_PROVIDER_SITE_OTHER): Payer: 59

## 2020-04-28 VITALS — BP 112/76 | HR 79 | Temp 98.1°F | Ht 67.0 in | Wt 271.0 lb

## 2020-04-28 DIAGNOSIS — M25562 Pain in left knee: Secondary | ICD-10-CM | POA: Diagnosis not present

## 2020-04-28 DIAGNOSIS — M1712 Unilateral primary osteoarthritis, left knee: Secondary | ICD-10-CM | POA: Diagnosis not present

## 2020-04-28 DIAGNOSIS — Z23 Encounter for immunization: Secondary | ICD-10-CM

## 2020-04-28 MED ORDER — TRAMADOL HCL 50 MG PO TABS: 50.0000 mg | ORAL_TABLET | Freq: Three times a day (TID) | ORAL | 0 refills | Status: AC | PRN

## 2020-04-28 MED FILL — LOSARTAN POTASSIUM 50 MG TA: 50 | 90 days supply | Qty: 90 | Fill #0

## 2020-04-28 MED FILL — traMADol HCL 50 MG TABS: 50 | 5 days supply | Qty: 15 | Fill #0

## 2020-04-28 NOTE — Progress Notes (Signed)
6/29/202111:28 AM  Megan Mitchell October 06, 1971, 49 y.o., female [id number]  Chief Complaint  Patient presents with  . Pain    left knee pain, climbing into bed which is very high off the floor, since injury hears popping in the knee    HPI:   Patient is a 49 y.o. female with past medical history significant for HTN who presents today for left knee pain  3 nights ago she was climbing onto her high bed placing more pressure onto left knee than usual and she woke up with painful swollen knee, pain is along outside of knee, feels as is it is going to give out when she walks, hears popping sound Has been wearing a sleeve brace but it keeps sliding down Today better, not swollen and no popping  Depression screen [facility] 2/9 04/07/2020 03/26/2020 02/26/2020  Decreased Interest 0 0 0  Down, Depressed, Hopeless 0 0 0  PHQ - 2 Score 0 0 0  Altered sleeping - - -  Tired, decreased energy - - -  Change in appetite - - -  Feeling bad or failure about yourself  - - -  Trouble concentrating - - -  Moving slowly or fidgety/restless - - -  Suicidal thoughts - - -  PHQ-9 Score - - -  Difficult doing work/chores - - -    Fall Risk  04/28/2020 04/07/2020 03/26/2020 02/26/2020 01/02/2020  Falls in the past year? 0 0 0 0 0  Number falls in past yr: 0 0 0 - 0  Injury with Fall? 0 0 0 - 0  Follow up - Falls evaluation completed - Falls evaluation completed -     Allergies  Allergen Reactions  . Sulfa Antibiotics Other (See Comments)    Makes mouth break out with a rash    Prior to Admission medications   Medication Sig Start Date End Date Taking? Authorizing Provider  amLODipine (NORVASC) 10 MG tablet Take 1 tablet (10 mg total) by mouth daily. 03/26/20  Yes Rutherford Guys, MD  hydrochlorothiazide (HYDRODIURIL) 25 MG tablet Take 1 tablet (25 mg total) by mouth daily. 03/26/20  Yes Rutherford Guys, MD  losartan (COZAAR) 50 MG tablet Take 1 tablet (50 mg total) by mouth daily. 03/26/20  Yes Rutherford Guys, MD  meloxicam (MOBIC) 15 MG tablet Take 1 tablet (15 mg total) by mouth daily. 04/07/20  Yes Maximiano Coss, NP  methocarbamol (ROBAXIN) 500 MG tablet Take 1 tablet (500 mg total) by mouth 4 (four) times daily. 04/07/20  Yes Maximiano Coss, NP  potassium chloride SA (KLOR-CON) 20 MEQ tablet Take 1 tablet (20 mEq total) by mouth daily. 03/26/20  Yes Rutherford Guys, MD    Past Medical History:  Diagnosis Date  . Anemia   . Hypertension     Past Surgical History:  Procedure Laterality Date  . CHOLECYSTECTOMY    . ELBOW SURGERY    . TUBAL LIGATION      Social History   Tobacco Use  . Smoking status: Never Smoker  . Smokeless tobacco: Never Used  Substance Use Topics  . Alcohol use: No    Family History  Problem Relation Age of Onset  . Hypertension Mother     ROS Per hpi  OBJECTIVE:  Today's Vitals   04/28/20 1045  BP: 112/76  Pulse: 79  Temp: 98.1 F (36.7 C)  SpO2: 97%  Weight: 271 lb (122.9 kg)  Height: 5\' 7"  (1.702 m)   Body mass index  is 42.44 kg/m.   Physical Exam Vitals and nursing note reviewed.  Constitutional:      Appearance: She is well-developed.  HENT:     Head: Normocephalic and atraumatic.  Eyes:     General: No scleral icterus.    Conjunctiva/sclera: Conjunctivae normal.     Pupils: Pupils are equal, round, and reactive to light.  Pulmonary:     Effort: Pulmonary effort is normal.  Musculoskeletal:     Cervical back: Neck supple.     Right knee: Normal.     Left knee: No swelling, effusion, bony tenderness or crepitus. Normal range of motion. Tenderness present over the lateral joint line. Normal pulse.     Instability Tests: Anterior drawer test negative. Posterior drawer test negative. Lateral McMurray test positive. Medial McMurray test negative.  Skin:    General: Skin is warm and dry.  Neurological:     Mental Status: She is alert and oriented to person, place, and time.     No results found for this or any previous visit  (from the past 24 hour(s)).  DG Knee Complete 4 Views Left  Result Date: 04/28/2020 CLINICAL DATA:  Chronic left knee pain.  No known injury. EXAM: LEFT KNEE - COMPLETE 4+ VIEW COMPARISON:  Plain films left knee 08/21/2017. FINDINGS: There is no acute bony or joint abnormality. Mild osteoarthritis appears worst in the patellofemoral compartment and unchanged. No focal bony lesion or. No chondrocalcinosis. IMPRESSION: No acute abnormality or notable change in mild osteoarthritis about the knee which is most notable in the patellofemoral compartment. Electronically Signed   By: Inge Rise M.D.   On: 04/28/2020 11:02     ASSESSMENT and PLAN  1. Acute pain of left knee Exam concerning for meniscal injury. Discussed RICE therapy. Ordered hinge brace. Referral to ortho - DG Knee Complete 4 Views Left - Ambulatory referral to Orthopedic Surgery  2. Need for vaccination - Varicella-zoster vaccine IM   Other orders - traMADol (ULTRAM) 50 MG tablet; Take 1 tablet (50 mg total) by mouth every 8 (eight) hours as needed for up to 5 days.  Return if symptoms worsen or fail to improve.    Rutherford Guys, MD Primary Care at [facility] [place], [place] [address] Ph.  [phone] Fax [phone]

## 2020-04-28 NOTE — Patient Instructions (Addendum)
   If you have lab work done today you will be contacted with your lab results within the next 2 weeks.  If you have not heard from us then please contact us. The fastest way to get your results is to register for My Chart.   IF you received an x-ray today, you will receive an invoice from [facility]. Please contact [facility] at [phone] with questions or concerns regarding your invoice.   IF you received labwork today, you will receive an invoice from [facility]. Please contact [facility] at [phone] with questions or concerns regarding your invoice.   Our billing staff will not be able to assist you with questions regarding bills from these companies.  You will be contacted with the lab results as soon as they are available. The fastest way to get your results is to activate your My Chart account. Instructions are located on the last page of this paperwork. If you have not heard from us regarding the results in 2 weeks, please contact this office.      Acute Knee Pain, Adult Acute knee pain is sudden and may be caused by damage, swelling, or irritation of the muscles and tissues that support your knee. The injury may result from:  A fall.  An injury to your knee from twisting motions.  A hit to the knee.  Infection. Acute knee pain may go away on its own with time and rest. If it does not, your health care provider may order tests to find the cause of the pain. These may include:  Imaging tests, such as an X-ray, MRI, or ultrasound.  Joint aspiration. In this test, fluid is removed from the knee.  Arthroscopy. In this test, a lighted tube is inserted into the knee and an image is projected onto a TV screen.  Biopsy. In this test, a sample of tissue is removed from the body and studied under a microscope. Follow these instructions at home: Pay attention to any changes in your symptoms. Take these actions to relieve your pain. If you have a knee sleeve  or brace:   Wear the sleeve or brace as told by your health care provider. Remove it only as told by your health care provider.  Loosen the sleeve or brace if your toes tingle, become numb, or turn cold and blue.  Keep the sleeve or brace clean.  If the sleeve or brace is not waterproof: ? Do not let it get wet. ? Cover it with a watertight covering when you take a bath or shower. Activity  Rest your knee.  Do not do things that cause pain or make pain worse.  Avoid high-impact activities or exercises, such as running, jumping rope, or doing jumping jacks.  Work with a physical therapist to make a safe exercise program, as recommended by your health care provider. Do exercises as told by your physical therapist. Managing pain, stiffness, and swelling   If directed, put ice on the knee: ? Put ice in a plastic bag. ? Place a towel between your skin and the bag. ? Leave the ice on for 20 minutes, 2-3 times a day.  If directed, use an elastic bandage to put pressure (compression) on your injured knee. This may control swelling, give support, and help with discomfort. General instructions  Take over-the-counter and prescription medicines only as told by your health care provider.  Raise (elevate) your knee above the level of your heart when you are sitting or lying down.    Sleep with a pillow under your knee.  Do not use any products that contain nicotine or tobacco, such as cigarettes, e-cigarettes, and chewing tobacco. These can delay healing. If you need help quitting, ask your health care provider.  If you are overweight, work with your health care provider and a dietitian to set a weight-loss goal that is healthy and reasonable for you. Extra weight can put pressure on your knee.  Keep all follow-up visits as told by your health care provider. This is important. Contact a health care provider if:  Your knee pain continues, changes, or gets worse.  You have a fever along  with knee pain.  Your knee feels warm to the touch.  Your knee buckles or locks up. Get help right away if:  Your knee swells, and the swelling becomes worse.  You cannot move your knee.  You have severe pain in your knee. Summary  Acute knee pain can be caused by a fall, an injury, an infection, or damage, swelling, or irritation of the tissues that support your knee.  Your health care provider may perform tests to find out the cause of the pain.  Pay attention to any changes in your symptoms. Relieve your pain with rest, medicines, light activity, and use of ice.  Get help if your pain continues or becomes worse, your knee swells, or you cannot move your knee. This information is not intended to replace advice given to you by your health care provider. Make sure you discuss any questions you have with your health care provider. Document Revised: 03/29/2018 Document Reviewed: 03/29/2018 Elsevier Patient Education  2020 [employer]vier Inc.  

## 2020-04-29 ENCOUNTER — Ambulatory Visit (INDEPENDENT_AMBULATORY_CARE_PROVIDER_SITE_OTHER): Payer: 59 | Admitting: Family Medicine

## 2020-04-29 ENCOUNTER — Encounter: Payer: Self-pay | Admitting: Family Medicine

## 2020-04-29 DIAGNOSIS — M25562 Pain in left knee: Secondary | ICD-10-CM | POA: Diagnosis not present

## 2020-04-29 NOTE — Patient Instructions (Signed)
   Glucosamine Sulfate:  1,000 mg twice daily

## 2020-04-29 NOTE — Progress Notes (Signed)
   Office Visit Note   Patient: Megan Mitchell           Date of Birth: [birth year]           MRN: [id number] Visit Date: 04/29/2020 Requested by: Rutherford Guys, MD [address] [place],  [place] [address] PCP: Rutherford Guys, MD  Subjective: Chief Complaint  Patient presents with  . Left Knee - Pain    Last Wednesday, she got up on her bed left knee first, putting all the weight on that knee. Started after that. Had xrays at PCP yesterday. Has been swelling. Tenderness lateral aspect. Weakness in the knee.    HPI: She is here with left knee pain.  Symptoms started a couple weeks ago when climbing up onto a bed and putting her weight onto her bent left knee.  She felt pain but not severe, the next morning it was much more painful and it is continued to bother her since then.  She is using ibuprofen with some relief.  No locking or giving way.  She had x-rays obtained which revealed mild to moderate tricompartmental degenerative change.  I reviewed those films today.              ROS:   All other systems were reviewed and are negative.  Objective: Vital Signs: There were no vitals taken for this visit.  Physical Exam:  General:  Alert and oriented, in no acute distress. Pulm:  Breathing unlabored. Psy:  Normal mood, congruent affect. Skin: No erythema Left knee: 1+ effusion with no warmth.  2+ patellofemoral crepitus in both knees.  Full active extension, flexion of 125 degrees.  She has no laxity with anterior drawer, varus or valgus stress.  She is tender on the lateral joint line and has pain but no palpable click McMurray's.  Imaging: None today  Assessment & Plan: 1.  Left knee pain with effusion, suspicious for degenerative lateral meniscus tear. -Elected to try a hinged knee brace for support.  Continue with anti-inflammatories and ice.  Glucosamine sulfate.  If symptoms persist, could contemplate a one-time cortisone injection.  If that does not help, then MRI  scan.     Procedures: No procedures performed  No notes on file     PMFS History: Patient Active Problem List   Diagnosis Date Noted  . Anemia 12/26/2014  . HTN (hypertension) 03/22/2014  . Severe obesity (BMI >= 40) ([facility]) 03/22/2014   Past Medical History:  Diagnosis Date  . Anemia   . Hypertension     Family History  Problem Relation Age of Onset  . Hypertension Mother     Past Surgical History:  Procedure Laterality Date  . CHOLECYSTECTOMY    . ELBOW SURGERY    . TUBAL LIGATION     Social History   Occupational History  . Not on file  Tobacco Use  . Smoking status: Never Smoker  . Smokeless tobacco: Never Used  Vaping Use  . Vaping Use: Never used  Substance and Sexual Activity  . Alcohol use: No  . Drug use: No  . Sexual activity: Yes    Birth control/protection: Pill

## 2020-05-10 ENCOUNTER — Encounter: Payer: Self-pay | Admitting: Registered Nurse

## 2020-05-18 MED FILL — AMLODIPINE BESYLATE 10 MG T: 10 | 90 days supply | Qty: 90 | Fill #3

## 2020-06-11 ENCOUNTER — Telehealth: Payer: 59 | Admitting: Family Medicine

## 2020-07-01 DIAGNOSIS — H5213 Myopia, bilateral: Secondary | ICD-10-CM | POA: Diagnosis not present

## 2020-07-01 MED FILL — HYDROCHLOROTHIAZIDE 25 MG T: 25 | 90 days supply | Qty: 90 | Fill #1

## 2020-07-14 ENCOUNTER — Encounter: Payer: Self-pay | Admitting: Family Medicine

## 2020-07-14 ENCOUNTER — Ambulatory Visit: Payer: 59 | Admitting: Family Medicine

## 2020-07-14 VITALS — BP 127/84 | HR 75 | Temp 98.0°F | Resp 16 | Ht 67.0 in | Wt 271.0 lb

## 2020-07-14 DIAGNOSIS — H9312 Tinnitus, left ear: Secondary | ICD-10-CM | POA: Diagnosis not present

## 2020-07-14 DIAGNOSIS — Z23 Encounter for immunization: Secondary | ICD-10-CM | POA: Diagnosis not present

## 2020-07-14 DIAGNOSIS — H6121 Impacted cerumen, right ear: Secondary | ICD-10-CM | POA: Diagnosis not present

## 2020-07-14 NOTE — Progress Notes (Signed)
9/14/20214:27 PM  DEBORA STOCKDALE 25-Feb-1971, 49 y.o., female [id number]  Chief Complaint  Patient presents with  . Tinnitus    pt notes ringing of Lt ear, started yesterday and pt notes no known cause    HPI:   Patient is a 49 y.o. female with past medical history significant for HTN who presents today for left ear ringing  Noticed ringing in left ear, not pulsatile, distractible No pain, sometimes itchy, no hearing changes, but sometimes feels pressurized In the past has had to have ear wax removal No jaw pain or sore throat or dizziness   Depression screen [facility] 2/9 07/14/2020 04/07/2020 03/26/2020  Decreased Interest 0 0 0  Down, Depressed, Hopeless 0 0 0  PHQ - 2 Score 0 0 0  Altered sleeping - - -  Tired, decreased energy - - -  Change in appetite - - -  Feeling bad or failure about yourself  - - -  Trouble concentrating - - -  Moving slowly or fidgety/restless - - -  Suicidal thoughts - - -  PHQ-9 Score - - -  Difficult doing work/chores - - -    Fall Risk  07/14/2020 04/28/2020 04/07/2020 03/26/2020 02/26/2020  Falls in the past year? 0 0 0 0 0  Number falls in past yr: 0 0 0 0 -  Injury with Fall? 0 0 0 0 -  Risk for fall due to : No Fall Risks - - - -  Follow up Falls evaluation completed - Falls evaluation completed - Falls evaluation completed     Allergies  Allergen Reactions  . Sulfa Antibiotics Other (See Comments)    Makes mouth break out with a rash    Prior to Admission medications   Medication Sig Start Date End Date Taking? Authorizing Provider  amLODipine (NORVASC) 10 MG tablet Take 1 tablet (10 mg total) by mouth daily. 03/26/20  Yes Rutherford Guys, MD  hydrochlorothiazide (HYDRODIURIL) 25 MG tablet Take 1 tablet (25 mg total) by mouth daily. 03/26/20  Yes Rutherford Guys, MD  ibuprofen (ADVIL) 400 MG tablet  01/08/20  Yes [provider]  losartan (COZAAR) 50 MG tablet Take 1 tablet (50 mg total) by mouth daily. 03/26/20  Yes Rutherford Guys, MD  meloxicam (MOBIC) 15 MG tablet Take 1 tablet (15 mg total) by mouth daily. 04/07/20  Yes Maximiano Coss, NP  methocarbamol (ROBAXIN) 500 MG tablet Take 1 tablet (500 mg total) by mouth 4 (four) times daily. 04/07/20  Yes Maximiano Coss, NP  potassium chloride SA (KLOR-CON) 20 MEQ tablet Take 1 tablet (20 mEq total) by mouth daily. 03/26/20  Yes Rutherford Guys, MD    Past Medical History:  Diagnosis Date  . Anemia   . Hypertension     Past Surgical History:  Procedure Laterality Date  . CHOLECYSTECTOMY    . ELBOW SURGERY    . TUBAL LIGATION      Social History   Tobacco Use  . Smoking status: Never Smoker  . Smokeless tobacco: Never Used  Substance Use Topics  . Alcohol use: No    Family History  Problem Relation Age of Onset  . Hypertension Mother     ROS Per hpi  OBJECTIVE:  Today's Vitals   07/14/20 1611  BP: 127/84  Pulse: 75  Resp: 16  Temp: 98 F (36.7 C)  TempSrc: Temporal  SpO2: 95%  Weight: 271 lb (122.9 kg)  Height: 5\' 7"  (1.702 m)   Body  mass index is 42.44 kg/m.   Physical Exam Vitals and nursing note reviewed.  Constitutional:      Appearance: She is well-developed.  HENT:     Head: Normocephalic and atraumatic.     Right Ear: Hearing, tympanic membrane, ear canal and external ear normal. There is impacted cerumen.     Left Ear: Hearing, tympanic membrane, ear canal and external ear normal.  Eyes:     Extraocular Movements: Extraocular movements intact.     Conjunctiva/sclera: Conjunctivae normal.     Pupils: Pupils are equal, round, and reactive to light.  Neck:     Vascular: No carotid bruit.  Cardiovascular:     Rate and Rhythm: Normal rate and regular rhythm.     Heart sounds: Normal heart sounds. No murmur heard.  No friction rub. No gallop.   Pulmonary:     Effort: Pulmonary effort is normal.     Breath sounds: Normal breath sounds. No wheezing, rhonchi or rales.  Musculoskeletal:     Cervical back: Neck supple.    Lymphadenopathy:     Cervical: No cervical adenopathy.  Skin:    General: Skin is warm and dry.  Neurological:     Mental Status: She is alert and oriented to person, place, and time.       No results found for this or any previous visit (from the past 24 hour(s)).  No results found.   ASSESSMENT and PLAN  1. Tinnitus of left ear Mild, started yesterday. Normal exam, monitor  2. Impacted cerumen of right ear - Ear wax removal - successfully removed by CMA  Other orders - Flu Vaccine QUAD 6+ mos PF IM (Fluarix Quad PF)  No follow-ups on file.    Rutherford Guys, MD [facility] at [facility], [place] [address] Ph.  [phone] Fax [phone]

## 2020-07-14 NOTE — Patient Instructions (Addendum)
   If you have lab work done today you will be contacted with your lab results within the next 2 weeks.  If you have not heard from us then please contact us. The fastest way to get your results is to register for My Chart.   IF you received an x-ray today, you will receive an invoice from [facility]. Please contact [facility] at [phone] with questions or concerns regarding your invoice.   IF you received labwork today, you will receive an invoice from [facility]. Please contact [facility] at [phone] with questions or concerns regarding your invoice.   Our billing staff will not be able to assist you with questions regarding bills from these companies.  You will be contacted with the lab results as soon as they are available. The fastest way to get your results is to activate your My Chart account. Instructions are located on the last page of this paperwork. If you have not heard from us regarding the results in 2 weeks, please contact this office.      Tinnitus Tinnitus refers to hearing a sound when there is no actual source for that sound. This is often described as ringing in the ears. However, people with this condition may hear a variety of noises, in one ear or in both ears. The sounds of tinnitus can be soft, loud, or somewhere in between. Tinnitus can last for a few seconds or can be constant for days. It may go away without treatment and come back at various times. When tinnitus is constant or happens often, it can lead to other problems, such as trouble sleeping and trouble concentrating. Almost everyone experiences tinnitus at some point. Tinnitus that is long-lasting (chronic) or comes back often (recurs) may require medical attention. What are the causes? The cause of tinnitus is often not known. In some cases, it can result from:  Exposure to loud noises from machinery, music, or other sources.  An object (foreign body) stuck in the ear.  Earwax  buildup.  Drinking alcohol or caffeine.  Taking certain medicines.  Age-related hearing loss. It may also be caused by medical conditions such as:  Ear or sinus infections.  High blood pressure.  Heart diseases.  Anemia.  Allergies.  Meniere's disease.  Thyroid problems.  Tumors.  A weak, bulging blood vessel (aneurysm) near the ear. What are the signs or symptoms? The main symptom of tinnitus is hearing a sound when there is no source for that sound. It may sound like:  Buzzing.  Roaring.  Ringing.  Blowing air.  Hissing.  Whistling.  Sizzling.  Humming.  Running water.  A musical note.  Tapping. Symptoms may affect only one ear (unilateral) or both ears (bilateral). How is this diagnosed? Tinnitus is diagnosed based on your symptoms, your medical history, and a physical exam. Your health care provider may do a thorough hearing test (audiologic exam) if your tinnitus:  Is unilateral.  Causes hearing difficulties.  Lasts 6 months or longer. You may work with a health care provider who specializes in hearing disorders (audiologist). You may be asked questions about your symptoms and how they affect your daily life. You may have other tests done, such as:  CT scan.  MRI.  An imaging test of how blood flows through your blood vessels (angiogram). How is this treated? Treating an underlying medical condition can sometimes make tinnitus go away. If your tinnitus continues, other treatments may include:  Medicines.  Therapy and counseling to help   you manage the stress of living with tinnitus.  Sound generators to mask the tinnitus. These include: ? Tabletop sound machines that play relaxing sounds to help you fall asleep. ? Wearable devices that fit in your ear and play sounds or music. ? Acoustic neural stimulation. This involves using headphones to listen to music that contains an auditory signal. Over time, listening to this signal may change  some pathways in your brain and make you less sensitive to tinnitus. This treatment is used for very severe cases when no other treatment is working.  Using hearing aids or cochlear implants if your tinnitus is related to hearing loss. Hearing aids are worn in the outer ear. Cochlear implants are surgically placed in the inner ear. Follow these instructions at home: Managing symptoms      When possible, avoid being in loud places and being exposed to loud sounds.  Wear hearing protection, such as earplugs, when you are exposed to loud noises.  Use a white noise machine, a humidifier, or other devices to mask the sound of tinnitus.  Practice techniques for reducing stress, such as meditation, yoga, or deep breathing. Work with your health care provider if you need help with managing stress.  Sleep with your head slightly raised. This may reduce the impact of tinnitus. General instructions  Do not use stimulants, such as nicotine, alcohol, or caffeine. Talk with your health care provider about other stimulants to avoid. Stimulants are substances that can make you feel alert and attentive by increasing certain activities in the body (such as heart rate and blood pressure). These substances may make tinnitus worse.  Take over-the-counter and prescription medicines only as told by your health care provider.  Try to get plenty of sleep each night.  Keep all follow-up visits as told by your health care provider. This is important. Contact a health care provider if:  Your tinnitus continues for 3 weeks or longer without stopping.  You develop sudden hearing loss.  Your symptoms get worse or do not get better with home care.  You feel you are not able to manage the stress of living with tinnitus. Get help right away if:  You develop tinnitus after a head injury.  You have tinnitus along with any of the following: ? Dizziness. ? Loss of balance. ? Nausea and vomiting. ? Sudden,  severe headache. These symptoms may represent a serious problem that is an emergency. Do not wait to see if the symptoms will go away. Get medical help right away. Call your local emergency services (911 in the [place] Do not drive yourself to the hospital. Summary  Tinnitus refers to hearing a sound when there is no actual source for that sound. This is often described as ringing in the ears.  Symptoms may affect only one ear (unilateral) or both ears (bilateral).  Use a white noise machine, a humidifier, or other devices to mask the sound of tinnitus.  Do not use stimulants, such as nicotine, alcohol, or caffeine. Talk with your health care provider about other stimulants to avoid. These substances may make tinnitus worse. This information is not intended to replace advice given to you by your health care provider. Make sure you discuss any questions you have with your health care provider. Document Revised: 05/01/2019 Document Reviewed: 07/27/2017 Elsevier Patient Education  2020 [employer] Inc.  

## 2020-07-20 ENCOUNTER — Encounter: Payer: Self-pay | Admitting: Registered Nurse

## 2020-07-20 NOTE — Progress Notes (Signed)
Acute Office Visit  Subjective:    Patient ID: Megan Mitchell, female    DOB: 12-Apr-1971, 49 y.o.   MRN: [id number]  Chief Complaint  Patient presents with  . Shoulder Pain    Patient states she woke up on saturday and had some servere pain in the left shoulder and it seem to be the worse on yesterday. Per patient she couldnt remove her clothes or lift her arm     HPI Patient is in today for acute shoulder pain.  Woke up on Saturday and had severe pain in L shoulder. Worse on Sunday. Could barely move arm d/t pain.  Notes that she had done some work this weekend that may have aggravated it but does not remember a distinct injury. No numbness or tingling distally Denies chest pain, shob, doe, crepitus, back pain, or further concern  Past Medical History:  Diagnosis Date  . Anemia   . Hypertension     Past Surgical History:  Procedure Laterality Date  . CHOLECYSTECTOMY    . ELBOW SURGERY    . TUBAL LIGATION      Family History  Problem Relation Age of Onset  . Hypertension Mother     Social History   Socioeconomic History  . Marital status: Divorced    Spouse name: Not on file  . Number of children: 2  . Years of education: Not on file  . Highest education level: Not on file  Occupational History  . Not on file  Tobacco Use  . Smoking status: Never Smoker  . Smokeless tobacco: Never Used  Vaping Use  . Vaping Use: Never used  Substance and Sexual Activity  . Alcohol use: No  . Drug use: No  . Sexual activity: Yes    Birth control/protection: Pill  Other Topics Concern  . Not on file  Social History Narrative  . Not on file   Social Determinants of Health   Financial Resource Strain:   . Difficulty of Paying Living Expenses: Not on file  Food Insecurity:   . Worried About Charity fundraiser in the Last Year: Not on file  . Ran Out of Food in the Last Year: Not on file  Transportation Needs:   . Lack of Transportation (Medical): Not on file  .  Lack of Transportation (Non-Medical): Not on file  Physical Activity:   . Days of Exercise per Week: Not on file  . Minutes of Exercise per Session: Not on file  Stress:   . Feeling of Stress : Not on file  Social Connections:   . Frequency of Communication with Friends and Family: Not on file  . Frequency of Social Gatherings with Friends and Family: Not on file  . Attends Religious Services: Not on file  . Active Member of Clubs or Organizations: Not on file  . Attends Archivist Meetings: Not on file  . Marital Status: Not on file  Intimate Partner Violence:   . Fear of Current or Ex-Partner: Not on file  . Emotionally Abused: Not on file  . Physically Abused: Not on file  . Sexually Abused: Not on file    Outpatient Medications Prior to Visit  Medication Sig Dispense Refill  . amLODipine (NORVASC) 10 MG tablet Take 1 tablet (10 mg total) by mouth daily. 90 tablet 3  . hydrochlorothiazide (HYDRODIURIL) 25 MG tablet Take 1 tablet (25 mg total) by mouth daily. 90 tablet 3  . losartan (COZAAR) 50 MG tablet Take 1  tablet (50 mg total) by mouth daily. 90 tablet 3  . potassium chloride SA (KLOR-CON) 20 MEQ tablet Take 1 tablet (20 mEq total) by mouth daily. 4 tablet 0   No facility-administered medications prior to visit.    Allergies  Allergen Reactions  . Sulfa Antibiotics Other (See Comments)    Makes mouth break out with a rash    Review of Systems  Constitutional: Negative.   HENT: Negative.   Eyes: Negative.   Respiratory: Negative.   Cardiovascular: Negative.   Gastrointestinal: Negative.   Genitourinary: Negative.   Musculoskeletal: Negative.   Skin: Negative.   Neurological: Negative.   Psychiatric/Behavioral: Negative.        Objective:    Physical Exam Vitals and nursing note reviewed.  Constitutional:      General: She is not in acute distress.    Appearance: Normal appearance. She is obese. She is not ill-appearing, toxic-appearing or  diaphoretic.  Cardiovascular:     Rate and Rhythm: Normal rate and regular rhythm.     Heart sounds: Normal heart sounds.  Pulmonary:     Effort: Pulmonary effort is normal. No respiratory distress.     Breath sounds: Normal breath sounds.  Musculoskeletal:        General: Tenderness and signs of injury present. No swelling or deformity.     Right lower leg: No edema.     Left lower leg: No edema.     Comments: rom limited in all directions in L shoulder TTP on anterior joint line  Neurological:     Mental Status: She is alert.     BP 129/82   Pulse 68   Temp (!) 97.5 F (36.4 C) (Temporal)   Resp 17   Ht 5\' 7"  (1.702 m)   Wt 270 lb 9.6 oz (122.7 kg)   SpO2 99%   BMI 42.38 kg/m  Wt Readings from Last 3 Encounters:  07/14/20 271 lb (122.9 kg)  04/28/20 271 lb (122.9 kg)  04/07/20 270 lb 9.6 oz (122.7 kg)    Health Maintenance Due  Topic Date Due  . PAP SMEAR-Modifier  07/02/2019    There are no preventive care reminders to display for this patient.   Lab Results  Component Value Date   TSH 1.680 03/24/2020   Lab Results  Component Value Date   WBC 9.4 11/30/2018   HGB 12.8 11/30/2018   HCT 38.4 11/30/2018   MCV 80 11/30/2018   PLT 587 (H) 11/30/2018   Lab Results  Component Value Date   NA 137 03/24/2020   K 3.4 (L) 03/24/2020   CO2 27 03/24/2020   GLUCOSE 76 03/24/2020   BUN 12 03/24/2020   CREATININE 0.68 03/24/2020   BILITOT 0.6 03/24/2020   ALKPHOS 83 03/24/2020   AST 16 03/24/2020   ALT 11 03/24/2020   PROT 7.5 03/24/2020   ALBUMIN 4.4 03/24/2020   CALCIUM 10.0 03/24/2020   ANIONGAP 13 11/04/2016   Lab Results  Component Value Date   CHOL 204 (H) 03/24/2020   Lab Results  Component Value Date   HDL 67 03/24/2020   Lab Results  Component Value Date   LDLCALC 125 (H) 03/24/2020   Lab Results  Component Value Date   TRIG 68 03/24/2020   Lab Results  Component Value Date   CHOLHDL 3.0 03/24/2020   Lab Results  Component Value  Date   HGBA1C 5.6 03/24/2020       Assessment & Plan:   Problem List Items  Addressed This Visit    None    Visit Diagnoses    Acute pain of left shoulder    -  Primary   Relevant Medications   meloxicam (MOBIC) 15 MG tablet   methocarbamol (ROBAXIN) 500 MG tablet   Other Relevant Orders   DG Shoulder Left (Completed)       Meds ordered this encounter  Medications  . meloxicam (MOBIC) 15 MG tablet    Sig: Take 1 tablet (15 mg total) by mouth daily.    Dispense:  30 tablet    Refill:  0    Order Specific Question:   Supervising Provider    AnswerPamella Pert, Lilia Argue [[id number]]  . methocarbamol (ROBAXIN) 500 MG tablet    Sig: Take 1 tablet (500 mg total) by mouth 4 (four) times daily.    Dispense:  60 tablet    Refill:  0    Order Specific Question:   Supervising Provider    AnswerRutherford Guys [[id number]]  . traMADol (ULTRAM) 50 MG tablet    Sig: Take 1 tablet (50 mg total) by mouth every 8 (eight) hours as needed for up to 5 days.    Dispense:  15 tablet    Refill:  0    Order Specific Question:   Supervising Provider    AnswerRutherford Guys [[id number]]   PLAN  Meloxicam, robaxin, and tramadol for relief  Suspect tendonitis  Follow up prn  Patient encouraged to call clinic with any questions, comments, or concerns.  Maximiano Coss, NP

## 2020-07-24 ENCOUNTER — Ambulatory Visit: Payer: 59 | Admitting: Family Medicine

## 2020-07-24 ENCOUNTER — Encounter: Payer: Self-pay | Admitting: Family Medicine

## 2020-07-24 ENCOUNTER — Other Ambulatory Visit: Payer: Self-pay

## 2020-07-24 VITALS — BP 126/75 | HR 83 | Temp 98.0°F | Ht 67.0 in | Wt 269.2 lb

## 2020-07-24 DIAGNOSIS — R202 Paresthesia of skin: Secondary | ICD-10-CM

## 2020-07-24 DIAGNOSIS — I10 Essential (primary) hypertension: Secondary | ICD-10-CM | POA: Diagnosis not present

## 2020-07-24 NOTE — Progress Notes (Signed)
9/24/20214:14 PM  Megan Mitchell 01-31-1971, 49 y.o., female [id number]  Chief Complaint  Patient presents with  . Medical Management of Chronic Issues    6 m f/u   . left foot numbness    almost a week     HPI:   Patient is a 49 y.o. female with past medical history significant for HTN who presents today for routine followup  She is overall doing well She has been working in losing weight She is having intermittent Left 3rd-5th toes She does have low back pain but does not radiate down her leg Not getting worse She notices that once at home and able to recline her legs it goes away She uses wide comfortable shoes at work  Lab Results  Component Value Date   CREATININE 0.68 03/24/2020   BUN 12 03/24/2020   NA 137 03/24/2020   K 3.4 (L) 03/24/2020   CL 98 03/24/2020   CO2 27 03/24/2020    Lab Results  Component Value Date   TSH 1.680 03/24/2020   Wt Readings from Last 3 Encounters:  07/24/20 269 lb 3.2 oz (122.1 kg)  07/14/20 271 lb (122.9 kg)  04/28/20 271 lb (122.9 kg)   BP Readings from Last 3 Encounters:  07/24/20 126/75  07/14/20 127/84  04/28/20 112/76   Lab Results  Component Value Date   HGBA1C 5.6 03/24/2020   HGBA1C 5.8 (H) 10/04/2019   HGBA1C 5.6 11/30/2018   Lab Results  Component Value Date   LDLCALC 125 (H) 03/24/2020   CREATININE 0.68 03/24/2020    Depression screen PHQ 2/9 07/24/2020 07/14/2020 04/07/2020  Decreased Interest 0 0 0  Down, Depressed, Hopeless 0 0 0  PHQ - 2 Score 0 0 0  Altered sleeping - - -  Tired, decreased energy - - -  Change in appetite - - -  Feeling bad or failure about yourself  - - -  Trouble concentrating - - -  Moving slowly or fidgety/restless - - -  Suicidal thoughts - - -  PHQ-9 Score - - -  Difficult doing work/chores - - -    Fall Risk  07/24/2020 07/14/2020 04/28/2020 04/07/2020 03/26/2020  Falls in the past year? 0 0 0 0 0  Number falls in past yr: 0 0 0 0 0  Injury with Fall? 0 0 0 0 0  Risk for  fall due to : - No Fall Risks - - -  Follow up Falls evaluation completed Falls evaluation completed - Falls evaluation completed -     Allergies  Allergen Reactions  . Sulfa Antibiotics Other (See Comments)    Makes mouth break out with a rash    Prior to Admission medications   Medication Sig Start Date End Date Taking? Authorizing Provider  amLODipine (NORVASC) 10 MG tablet Take 1 tablet (10 mg total) by mouth daily. 03/26/20  Yes Rutherford Guys, MD  hydrochlorothiazide (HYDRODIURIL) 25 MG tablet Take 1 tablet (25 mg total) by mouth daily. 03/26/20  Yes Rutherford Guys, MD  ibuprofen (ADVIL) 400 MG tablet  01/08/20  Yes [provider]  losartan (COZAAR) 50 MG tablet Take 1 tablet (50 mg total) by mouth daily. 03/26/20  Yes Rutherford Guys, MD  meloxicam (MOBIC) 15 MG tablet Take 1 tablet (15 mg total) by mouth daily. 04/07/20  Yes Maximiano Coss, NP  methocarbamol (ROBAXIN) 500 MG tablet Take 1 tablet (500 mg total) by mouth 4 (four) times daily. 04/07/20  Yes Maximiano Coss,  NP  potassium chloride SA (KLOR-CON) 20 MEQ tablet Take 1 tablet (20 mEq total) by mouth daily. 03/26/20  Yes Rutherford Guys, MD    Past Medical History:  Diagnosis Date  . Anemia   . Hypertension     Past Surgical History:  Procedure Laterality Date  . CHOLECYSTECTOMY    . ELBOW SURGERY    . TUBAL LIGATION      Social History   Tobacco Use  . Smoking status: Never Smoker  . Smokeless tobacco: Never Used  Substance Use Topics  . Alcohol use: No    Family History  Problem Relation Age of Onset  . Hypertension Mother     ROS Per hpi  OBJECTIVE:  Today's Vitals   07/24/20 1609  BP: 126/75  Pulse: 83  Temp: 98 F (36.7 C)  TempSrc: Temporal  SpO2: 99%  Weight: 269 lb 3.2 oz (122.1 kg)  Height: 5\' 7"  (1.702 m)   Body mass index is 42.16 kg/m.   Physical Exam Vitals and nursing note reviewed.  Constitutional:      Appearance: She is well-developed.  HENT:     Head:  Normocephalic and atraumatic.  Eyes:     General: No scleral icterus.    Conjunctiva/sclera: Conjunctivae normal.     Pupils: Pupils are equal, round, and reactive to light.  Cardiovascular:     Pulses:          Dorsalis pedis pulses are 2+ on the left side.       Posterior tibial pulses are 2+ on the left side.  Pulmonary:     Effort: Pulmonary effort is normal.  Musculoskeletal:     Cervical back: Neck supple.     Left foot: Normal range of motion.  Feet:     Left foot:     Skin integrity: Skin integrity normal.     Toenail Condition: Left toenails are normal.  Skin:    General: Skin is warm and dry.  Neurological:     Mental Status: She is alert and oriented to person, place, and time.      No results found for this or any previous visit (from the past 24 hour(s)).  No results found.   ASSESSMENT and PLAN  1. Essential hypertension Controlled. Continue current regime.  - Basic Metabolic Panel  2. Paresthesia of left foot Normal a1c and tsh, checking b12, normal foot exam. Monitor. Consider podiatry if worsening - Vitamin B12  Return in about 6 months (around 01/21/2021) for [place] - TOC.    Rutherford Guys, MD [facility] [place], [place] [address] Ph.  [phone] Fax [phone]

## 2020-07-24 NOTE — Patient Instructions (Signed)
° ° ° °  If you have lab work done today you will be contacted with your lab results within the next 2 weeks.  If you have not heard from us then please contact us. The fastest way to get your results is to register for My Chart. ° ° °IF you received an x-ray today, you will receive an invoice from [facility]. Please contact [facility] at [phone] with questions or concerns regarding your invoice.  ° °IF you received labwork today, you will receive an invoice from [facility]. Please contact [facility] at [phone] with questions or concerns regarding your invoice.  ° °Our billing staff will not be able to assist you with questions regarding bills from these companies. ° °You will be contacted with the lab results as soon as they are available. The fastest way to get your results is to activate your My Chart account. Instructions are located on the last page of this paperwork. If you have not heard from us regarding the results in 2 weeks, please contact this office. °  ° ° ° °

## 2020-07-25 LAB — BASIC METABOLIC PANEL
BUN/Creatinine Ratio: 22 (ref 9–23)
BUN: 14 mg/dL (ref 6–24)
CO2: 25 mmol/L (ref 20–29)
Calcium: 10 mg/dL (ref 8.7–10.2)
Chloride: 101 mmol/L (ref 96–106)
Creatinine, Ser: 0.63 mg/dL (ref 0.57–1.00)
GFR calc Af Amer: 120 mL/min/{1.73_m2} (ref >59)
GFR calc non Af Amer: 104 mL/min/{1.73_m2} (ref >59)
Glucose: 82 mg/dL (ref 65–99)
Potassium: 3.2 mmol/L — ABNORMAL LOW (ref 3.5–5.2)
Sodium: 141 mmol/L (ref 134–144)

## 2020-07-25 LAB — VITAMIN B12: Vitamin B-12: 770 pg/mL (ref 232–1245)

## 2020-07-27 MED FILL — LOSARTAN POTASSIUM 50 MG TA: 50 | 90 days supply | Qty: 90 | Fill #1

## 2020-08-11 ENCOUNTER — Other Ambulatory Visit: Payer: Self-pay | Admitting: Family Medicine

## 2020-08-11 ENCOUNTER — Telehealth: Payer: Self-pay

## 2020-08-11 DIAGNOSIS — E876 Hypokalemia: Secondary | ICD-10-CM

## 2020-08-11 MED ORDER — TRIAMTERENE-HCTZ 37.5-25 MG PO TABS: 1.0000 | ORAL_TABLET | Freq: Every day | ORAL | 1 refills | Status: DC

## 2020-08-11 MED ORDER — POTASSIUM CHLORIDE CRYS ER 20 MEQ PO TBCR
20.0000 meq | EXTENDED_RELEASE_TABLET | Freq: Every day | ORAL | 0 refills | Status: DC
Start: 2020-08-11 — End: 2021-01-19

## 2020-08-11 MED FILL — POTASSIUM CHLORIDE CRYS ER: 20 | 7 days supply | Qty: 7 | Fill #0

## 2020-08-11 MED FILL — TRIAMTERENE-HCTZ 37.5-25 MG: 37.5-25 | 90 days supply | Qty: 90 | Fill #0

## 2020-08-11 NOTE — Telephone Encounter (Signed)
Pt called back and I informed her of lab result and the request  for the repeat lab in 1 week.

## 2020-08-17 MED FILL — AMLODIPINE BESYLATE 10 MG T: 10 | 90 days supply | Qty: 90 | Fill #0

## 2020-08-20 ENCOUNTER — Other Ambulatory Visit: Payer: Self-pay

## 2020-08-20 ENCOUNTER — Ambulatory Visit (INDEPENDENT_AMBULATORY_CARE_PROVIDER_SITE_OTHER): Payer: 59 | Admitting: Family Medicine

## 2020-08-20 DIAGNOSIS — E876 Hypokalemia: Secondary | ICD-10-CM

## 2020-08-21 ENCOUNTER — Inpatient Hospital Stay: Payer: 59 | Attending: Family Medicine

## 2020-08-21 DIAGNOSIS — Z23 Encounter for immunization: Secondary | ICD-10-CM | POA: Insufficient documentation

## 2020-08-21 LAB — BASIC METABOLIC PANEL
BUN/Creatinine Ratio: 26 — ABNORMAL HIGH (ref 9–23)
BUN: 21 mg/dL (ref 6–24)
CO2: 23 mmol/L (ref 20–29)
Calcium: 10.2 mg/dL (ref 8.7–10.2)
Chloride: 99 mmol/L (ref 96–106)
Creatinine, Ser: 0.81 mg/dL (ref 0.57–1.00)
GFR calc Af Amer: 97 mL/min/{1.73_m2} (ref >59)
GFR calc non Af Amer: 84 mL/min/{1.73_m2} (ref >59)
Glucose: 76 mg/dL (ref 65–99)
Potassium: 3.8 mmol/L (ref 3.5–5.2)
Sodium: 137 mmol/L (ref 134–144)

## 2020-08-21 NOTE — Progress Notes (Signed)
   Covid-19 Vaccination Clinic  Name:  Megan Mitchell    MRN: [id number] DOB: Nov 01, 1970  08/21/2020  Megan Mitchell was observed post Covid-19 immunization for 15 minutes without incident. She was provided with Vaccine Information Sheet and instruction to access the V-Safe system.   Megan Mitchell was instructed to call 911 with any severe reactions post vaccine: Marland Kitchen Difficulty breathing  . Swelling of face and throat  . A fast heartbeat  . A bad rash all over body  . Dizziness and weakness

## 2020-08-24 ENCOUNTER — Other Ambulatory Visit: Payer: Self-pay | Admitting: Family Medicine

## 2020-08-24 ENCOUNTER — Telehealth: Payer: Self-pay | Admitting: Emergency Medicine

## 2020-08-24 ENCOUNTER — Encounter: Payer: Self-pay | Admitting: Family Medicine

## 2020-08-24 NOTE — Telephone Encounter (Signed)
Patient calls to request most recent potassium results, please advise .  She has an appt 01/21/21.

## 2020-08-24 NOTE — Telephone Encounter (Signed)
Her last potassium is 3.8 which is normal.

## 2020-08-24 NOTE — Telephone Encounter (Signed)
Pt is wanting a cb to speak with someone about this level. Please advise at [phone]

## 2020-08-24 NOTE — Telephone Encounter (Signed)
Please call patient with recent potassium results  Ok to leave message [phone] or   at work number as well [phone]  At [phone] until 3:30

## 2020-08-24 NOTE — Telephone Encounter (Signed)
error 

## 2020-08-25 ENCOUNTER — Telehealth: Payer: Self-pay

## 2020-08-25 NOTE — Telephone Encounter (Signed)
LVM for detail of lab results , provider sent message via my-chart regarding normal potassium lab levels.

## 2020-08-25 NOTE — Telephone Encounter (Signed)
Patient has been informed per drs result notes and recommendations. Patient verbalizes understanding.

## 2020-08-25 NOTE — Telephone Encounter (Signed)
Pt is calling back and is wanting a cb. If it is before 3:30 call work number [phone] After 3:30 call her cell [phone] Please advise.

## 2020-09-01 ENCOUNTER — Ambulatory Visit: Payer: 59 | Admitting: Family Medicine

## 2020-09-01 ENCOUNTER — Other Ambulatory Visit: Payer: Self-pay

## 2020-09-01 ENCOUNTER — Encounter: Payer: Self-pay | Admitting: Family Medicine

## 2020-09-01 DIAGNOSIS — M25562 Pain in left knee: Secondary | ICD-10-CM | POA: Diagnosis not present

## 2020-09-01 NOTE — Progress Notes (Signed)
   Office Visit Note   Patient: Megan Mitchell           Date of Birth: 1971/05/10           MRN: [id number] Visit Date: 09/01/2020 Requested by: No referring provider defined for this encounter. PCP: Rutherford Guys, MD (Inactive)  Subjective: Chief Complaint  Patient presents with  . Left Knee - Pain    Pain had gotten better since last ov. Last week she started having popping in the knee with walking and "tightness." She put her hinged brace on - helped with the instability.    HPI: She is here with persistent left knee pain.  She had improved for a little while with the knee brace, but last week the knee started popping and she has had some tightness in the knee again.  She is concerned because they're getting ready to move into the new house soon.              ROS:   All other systems were reviewed and are negative.  Objective: Vital Signs: There were no vitals taken for this visit.  Physical Exam:  General:  Alert and oriented, in no acute distress. Pulm:  Breathing unlabored. Psy:  Normal mood, congruent affect. Skin: No erythema Left knee: 1-2+ effusion with no warmth.  Full active extension, trace patellofemoral crepitus.  Tender on the medial joint line, no palpable click with McMurray's.  No laxity with varus or valgus stress.  Imaging: No results found.  Assessment & Plan: 1.  Left knee pain with effusion, suspicious for degenerative medial meniscus tear. -Discussed options and elected to inject 1 time with cortisone.  If this doesn't help, then MRI scan.     Procedures: Left knee steroid injection: After sterile prep with Betadine, injected 5 cc 1% lidocaine without epinephrine and 6 mg betamethasone from superolateral approach.  A flash of clear yellow synovial fluid was obtained prior to injection.   PMFS History: Patient Active Problem List   Diagnosis Date Noted  . Anemia 12/26/2014  . HTN (hypertension) 03/22/2014  . Severe obesity (BMI >= 40) ([place])  03/22/2014   Past Medical History:  Diagnosis Date  . Anemia   . Hypertension     Family History  Problem Relation Age of Onset  . Hypertension Mother     Past Surgical History:  Procedure Laterality Date  . CHOLECYSTECTOMY    . ELBOW SURGERY    . TUBAL LIGATION     Social History   Occupational History  . Not on file  Tobacco Use  . Smoking status: Never Smoker  . Smokeless tobacco: Never Used  Vaping Use  . Vaping Use: Never used  Substance and Sexual Activity  . Alcohol use: No  . Drug use: No  . Sexual activity: Yes    Birth control/protection: Pill

## 2020-09-02 ENCOUNTER — Telehealth: Payer: Self-pay | Admitting: Family Medicine

## 2020-09-02 ENCOUNTER — Telehealth: Payer: Self-pay | Admitting: *Deleted

## 2020-09-02 NOTE — Telephone Encounter (Signed)
Schedule mobile mammogram unit 11-16

## 2020-09-02 NOTE — Telephone Encounter (Signed)
Patient is calling in wanting more info on Mammogram pt just had Covid boosters and is not sure if she is a candidate .patient works from 7-3:30 everyday and wants to know how late the mobile unit is here  Patients states you can leave a detailed message  on [phone]   And pt can call back in the am to schedule appt .

## 2020-09-02 NOTE — Telephone Encounter (Signed)
Left voicemail should wait atleast 6 weeks.  We will be having one in December and she can call and schedule her mammogram for that mobile unit .

## 2020-09-10 ENCOUNTER — Other Ambulatory Visit: Payer: Self-pay | Admitting: Lab

## 2020-09-11 ENCOUNTER — Other Ambulatory Visit: Payer: Self-pay | Admitting: Family Medicine

## 2020-09-11 DIAGNOSIS — Z1231 Encounter for screening mammogram for malignant neoplasm of breast: Secondary | ICD-10-CM

## 2020-09-25 ENCOUNTER — Ambulatory Visit: Payer: 59 | Admitting: Family Medicine

## 2020-09-28 ENCOUNTER — Telehealth: Payer: Self-pay | Admitting: Family Medicine

## 2020-09-28 DIAGNOSIS — M25562 Pain in left knee: Secondary | ICD-10-CM

## 2020-09-28 NOTE — Telephone Encounter (Signed)
MRI ordered

## 2020-09-28 NOTE — Telephone Encounter (Signed)
Please advise 

## 2020-09-28 NOTE — Telephone Encounter (Signed)
Left a full voice mail - MRI ordered. Imaging facility will call her. Results will be sent through [place], usually.

## 2020-09-28 NOTE — Telephone Encounter (Signed)
Patient called. She would like a MRI done. Her call back number is [phone]

## 2020-10-08 ENCOUNTER — Telehealth: Payer: Self-pay | Admitting: Family Medicine

## 2020-10-08 NOTE — Telephone Encounter (Signed)
Patient called requesting pain medication. Patient states she has taken tylenol and ibuprofen and its not touching the pain. Please send to [facility]. Please call patient when medication has been call in and leave a message on phone number [phone].

## 2020-10-08 NOTE — Telephone Encounter (Signed)
Patient called asking for an update of medication being sent. Please call patient at [phone]

## 2020-10-09 ENCOUNTER — Other Ambulatory Visit: Payer: Self-pay | Admitting: Family Medicine

## 2020-10-09 MED ORDER — TRAMADOL HCL 50 MG PO TABS
50.0000 mg | ORAL_TABLET | Freq: Four times a day (QID) | ORAL | 0 refills | Status: DC | PRN
Start: 2020-10-09 — End: 2020-11-25

## 2020-10-09 MED FILL — traMADol HCL 50 MG TABS: 50 | 8 days supply | Qty: 30 | Fill #0

## 2020-10-09 NOTE — Telephone Encounter (Signed)
Please advise 

## 2020-10-09 NOTE — Telephone Encounter (Signed)
Left this info on the patient's voice mail.

## 2020-10-09 NOTE — Telephone Encounter (Signed)
Tramadol Rx sent 

## 2020-10-09 NOTE — Telephone Encounter (Signed)
Duplicate- see other message

## 2020-10-13 ENCOUNTER — Other Ambulatory Visit: Payer: 59

## 2020-10-20 ENCOUNTER — Ambulatory Visit
Admission: RE | Admit: 2020-10-20 | Discharge: 2020-10-20 | Disposition: A | Discharge status: Home / Self Care | Payer: 59 | Source: Ambulatory Visit | Attending: Family Medicine | Admitting: Family Medicine

## 2020-10-20 ENCOUNTER — Other Ambulatory Visit: Payer: Self-pay

## 2020-10-20 DIAGNOSIS — M23222 Derangement of posterior horn of medial meniscus due to old tear or injury, left knee: Secondary | ICD-10-CM | POA: Diagnosis not present

## 2020-10-20 DIAGNOSIS — M25562 Pain in left knee: Secondary | ICD-10-CM

## 2020-10-21 ENCOUNTER — Telehealth: Payer: Self-pay | Admitting: Family Medicine

## 2020-10-21 NOTE — Telephone Encounter (Signed)
MRI shows meniscus cartilage tear, but also quite a bit of arthritis in the joint.  The results of arthroscopic surgery for a meniscus tear when there is a lot of arthritis are not as good as when the knee is otherwise healthy.  Prior to considering surgery (unless the knee is locking up and giving way all the time), it might be worthwhile to get insurance approval for "gel" injections (hyaluronic acid), which can often provide longer relief than steroid injections.  If you would prefer to consult with a surgeon, I can certainly arrange that for you.

## 2020-10-26 ENCOUNTER — Telehealth: Payer: Self-pay | Admitting: Family Medicine

## 2020-10-26 MED FILL — LOSARTAN POTASSIUM 50 MG TA: 50 | 90 days supply | Qty: 90 | Fill #2

## 2020-10-26 NOTE — Telephone Encounter (Signed)
Please submit prior authorization request for visco supplementation left knee (for OA). I did call the patient back, leaving a voice message on the procedure for authorization, how long it can take, and that she would be receiving a call to have an ov scheduled to have the injection.

## 2020-10-26 NOTE — Telephone Encounter (Signed)
Patient called. She would like to have gel injection in her L knee. Her call back number is [phone]

## 2020-10-27 NOTE — Telephone Encounter (Signed)
Noted  

## 2020-11-03 ENCOUNTER — Ambulatory Visit: Payer: 59 | Admitting: Family Medicine

## 2020-11-04 ENCOUNTER — Other Ambulatory Visit: Payer: Self-pay | Admitting: Family Medicine

## 2020-11-04 ENCOUNTER — Other Ambulatory Visit: Payer: Self-pay

## 2020-11-04 ENCOUNTER — Ambulatory Visit: Payer: 59 | Admitting: Family Medicine

## 2020-11-04 ENCOUNTER — Encounter: Payer: Self-pay | Admitting: Family Medicine

## 2020-11-04 VITALS — BP 134/91 | HR 110 | Temp 98.0°F | Ht 67.0 in | Wt 262.0 lb

## 2020-11-04 DIAGNOSIS — Z1211 Encounter for screening for malignant neoplasm of colon: Secondary | ICD-10-CM

## 2020-11-04 DIAGNOSIS — R140 Abdominal distension (gaseous): Secondary | ICD-10-CM | POA: Diagnosis not present

## 2020-11-04 DIAGNOSIS — R300 Dysuria: Secondary | ICD-10-CM

## 2020-11-04 LAB — POCT URINALYSIS DIP (MANUAL ENTRY)
Blood, UA: NEGATIVE
Glucose, UA: NEGATIVE mg/dL
Leukocytes, UA: NEGATIVE
Nitrite, UA: NEGATIVE
Protein Ur, POC: 30 mg/dL — AB
Spec Grav, UA: 1.025 (ref 1.010–1.025)
Urobilinogen, UA: 0.2 E.U./dL
pH, UA: 5.5 (ref 5.0–8.0)

## 2020-11-04 MED ORDER — PANTOPRAZOLE SODIUM 40 MG PO TBEC: 40.0000 mg | DELAYED_RELEASE_TABLET | Freq: Every day | ORAL | 0 refills | Status: DC

## 2020-11-04 MED FILL — PANTOPRAZOLE SOD DR 40 MG T: 40 | 30 days supply | Qty: 30 | Fill #0

## 2020-11-04 NOTE — Patient Instructions (Addendum)
They will call you to schedule the colonoscopy and the Trans-vaginal ultrasound.  When you leave today schedule a pap smear at the front desk.  Take protonix daily for one month.   Abdominal Bloating When you have abdominal bloating, your abdomen may feel full, tight, or painful. It may also look bigger than normal or swollen (distended). Common causes of abdominal bloating include:  Swallowing air.  Constipation.  Problems digesting food.  Eating too much.  Irritable bowel syndrome. This is a condition that affects the large intestine.  Lactose intolerance. This is an inability to digest lactose, a natural sugar in dairy products.  Celiac disease. This is a condition that affects the ability to digest gluten, a protein found in some grains.  Gastroparesis. This is a condition that slows down the movement of food in the stomach and small intestine. It is more common in people with diabetes mellitus.  Gastroesophageal reflux disease (GERD). This is a digestive condition that makes stomach acid flow back into the esophagus.  Urinary retention. This means that the body is holding onto urine, and the bladder cannot be emptied all the way. Follow these instructions at home: Eating and drinking  Avoid eating too much.  Try not to swallow air while talking or eating.  Avoid eating while lying down.  Avoid these foods and drinks: ? Foods that cause gas, such as broccoli, cabbage, cauliflower, and baked beans. ? Carbonated drinks. ? Hard candy. ? Chewing gum. Medicines  Take over-the-counter and prescription medicines only as told by your health care provider.  Take probiotic medicines. These medicines contain live bacteria or yeasts that can help digestion.  Take coated peppermint oil capsules. Activity  Try to exercise regularly. Exercise may help to relieve bloating that is caused by gas and relieve constipation. General instructions  Keep all follow-up visits as  told by your health care provider. This is important. Contact a health care provider if:  You have nausea and vomiting.  You have diarrhea.  You have abdominal pain.  You have unusual weight loss or weight gain.  You have severe pain, and medicines do not help. Get help right away if:  You have severe chest pain.  You have trouble breathing.  You have shortness of breath.  You have trouble urinating.  You have darker urine than normal.  You have blood in your stools or have dark, tarry stools. Summary  Abdominal bloating means that the abdomen is swollen.  Common causes of abdominal bloating are swallowing air, constipation, and problems digesting food.  Avoid eating too much and avoid swallowing air.  Avoid foods that cause gas, carbonated drinks, hard candy, and chewing gum. This information is not intended to replace advice given to you by your health care provider. Make sure you discuss any questions you have with your health care provider. Document Revised: 02/04/2019 Document Reviewed: 11/18/2016 Elsevier Patient Education  [employer].    If you have lab work done today you will be contacted with your lab results within the next 2 weeks.  If you have not heard from [place] then please contact us. The fastest way to get your results is to register for My Chart.   IF you received an x-ray today, you will receive an invoice from [facility]. Please contact [facility] at [phone] with questions or concerns regarding your invoice.   IF you received labwork today, you will receive an invoice from [facility]. Please contact [facility] at [phone] with questions or concerns  regarding your invoice.   Our billing staff will not be able to assist you with questions regarding bills from these companies.  You will be contacted with the lab results as soon as they are available. The fastest way to get your results is to activate your My Chart  account. Instructions are located on the last page of this paperwork. If you have not heard from [place] regarding the results in 2 weeks, please contact this office.

## 2020-11-04 NOTE — Progress Notes (Signed)
1/5/202212:09 PM  Megan Mitchell 11-26-1970, 50 y.o., female [id number]  Chief Complaint  Patient presents with  . Bloated    Couple of weeks , lack of appetite , anxiety   . burning w/ urination    Since yesterday     HPI:   Patient is a 50 y.o. female with past medical history significant for HTN who presents today for abdominal bloating.  Works at [employer] Was told years ago she had fibroids Had a tubal ligation Has had gall bladder removed Previously had heavy periods and cramping Had been on OCPs to help regulate this Stopped OCPs age 13 Haven't had period since Sept Feels bloated all the time Does have issues with dairy Started a coffee and lemon diet ( this didn't help) Has had 2 prior children Has had a lack of appetite due to anxiety No cramping before bowel movements Has a BM every day Rarely diarrhea Denies family history of cancer (unknown father)  Mammogram: scheduled for 1/16  HTN Amlodipine 37m daily Losartan 545mdaily BP Readings from Last 3 Encounters:  11/04/20 (!) 134/91  07/24/20 126/75  07/14/20 127/84      Depression screen PHQ 2/9 11/04/2020 07/24/2020 07/14/2020  Decreased Interest 0 0 0  Down, Depressed, Hopeless 0 0 0  PHQ - 2 Score 0 0 0  Altered sleeping - - -  Tired, decreased energy - - -  Change in appetite - - -  Feeling bad or failure about yourself  - - -  Trouble concentrating - - -  Moving slowly or fidgety/restless - - -  Suicidal thoughts - - -  PHQ-9 Score - - -  Difficult doing work/chores - - -    Fall Risk  11/04/2020 07/24/2020 07/14/2020 04/28/2020 04/07/2020  Falls in the past year? 0 0 0 0 0  Number falls in past yr: 0 0 0 0 0  Injury with Fall? 0 0 0 0 0  Risk for fall due to : - - No Fall Risks - -  Follow up Falls evaluation completed Falls evaluation completed Falls evaluation completed - Falls evaluation completed     Allergies  Allergen Reactions  . Sulfa Antibiotics Other (See Comments)     Makes mouth break out with a rash    Prior to Admission medications   Medication Sig Start Date End Date Taking? Authorizing Provider  amLODipine (NORVASC) 10 MG tablet Take 1 tablet (10 mg total) by mouth daily. 03/26/20  Yes SaJacelyn PiIrLilia ArgueMD  losartan (COZAAR) 50 MG tablet Take 1 tablet (50 mg total) by mouth daily. 03/26/20  Yes SaJacelyn PiIrLilia ArgueMD  triamterene-hydrochlorothiazide (MAXZIDE-25) 37.5-25 MG tablet Take 1 tablet by mouth daily. 08/11/20  Yes SaJacelyn PiIrLilia ArgueMD  ibuprofen (ADVIL) 400 MG tablet  01/08/20   [provider]  meloxicam (MOBIC) 15 MG tablet Take 1 tablet (15 mg total) by mouth daily. Patient not taking: No sig reported 04/07/20   MoMaximiano CossNP  methocarbamol (ROBAXIN) 500 MG tablet Take 1 tablet (500 mg total) by mouth 4 (four) times daily. Patient not taking: No sig reported 04/07/20   MoMaximiano CossNP  potassium chloride SA (KLOR-CON) 20 MEQ tablet Take 1 tablet (20 mEq total) by mouth daily. Patient not taking: Reported on 11/04/2020 08/11/20   SaJacelyn PiIrLilia ArgueMD  traMADol (ULTRAM) 50 MG tablet Take 1 tablet (50 mg total) by mouth every 6 (six) hours as needed. Patient not  taking: Reported on 11/04/2020 10/09/20   Eunice Blase, MD    Past Medical History:  Diagnosis Date  . Anemia   . Hypertension     Past Surgical History:  Procedure Laterality Date  . CHOLECYSTECTOMY    . ELBOW SURGERY    . TUBAL LIGATION      Social History   Tobacco Use  . Smoking status: Never Smoker  . Smokeless tobacco: Never Used  Substance Use Topics  . Alcohol use: No    Family History  Problem Relation Age of Onset  . Hypertension Mother     Review of Systems  Constitutional: Negative for chills, fever and malaise/fatigue.  Eyes: Negative for blurred vision and double vision.  Respiratory: Negative for cough, shortness of breath and wheezing.   Cardiovascular: Negative for chest pain, palpitations and leg swelling.   Gastrointestinal: Negative for abdominal pain, blood in stool, constipation, diarrhea, heartburn, nausea and vomiting.       Bloating  Genitourinary: Positive for dysuria. Negative for frequency, hematuria and urgency.  Musculoskeletal: Negative for back pain and joint pain.  Skin: Negative for rash.  Neurological: Negative for dizziness, weakness and headaches.     OBJECTIVE:  Today's Vitals   11/04/20 0801  BP: (!) 134/91  Pulse: (!) 110  Temp: 98 F (36.7 C)  SpO2: 99%  Weight: 262 lb (118.8 kg)  Height: '5\' 7"'  (1.702 m)   Body mass index is 41.04 kg/m.   Physical Exam Constitutional:      General: She is not in acute distress.    Appearance: Normal appearance. She is not ill-appearing.  HENT:     Head: Normocephalic.  Cardiovascular:     Rate and Rhythm: Normal rate and regular rhythm.     Pulses: Normal pulses.     Heart sounds: Normal heart sounds. No murmur heard. No friction rub. No gallop.   Pulmonary:     Effort: Pulmonary effort is normal. No respiratory distress.     Breath sounds: Normal breath sounds. No stridor. No wheezing, rhonchi or rales.  Abdominal:     General: Bowel sounds are normal. There is no distension.     Palpations: Abdomen is soft. There is no mass.     Tenderness: There is no abdominal tenderness. There is no right CVA tenderness, left CVA tenderness, guarding or rebound.     Hernia: No hernia is present.  Musculoskeletal:     Right lower leg: No edema.     Left lower leg: No edema.  Skin:    General: Skin is warm and dry.     Coloration: Skin is not jaundiced.  Neurological:     Mental Status: She is alert and oriented to person, place, and time.  Psychiatric:        Mood and Affect: Mood normal.        Behavior: Behavior normal.     Results for orders placed or performed in visit on 11/04/20 (from the past 24 hour(s))  POCT urinalysis dipstick     Status: Abnormal   Collection Time: 11/04/20  8:32 AM  Result Value Ref  Range   Color, UA yellow yellow   Clarity, UA clear clear   Glucose, UA negative negative mg/dL   Bilirubin, UA moderate (A) negative   Ketones, POC UA large (80) (A) negative mg/dL   Spec Grav, UA 1.025 1.010 - 1.025   Blood, UA negative negative   pH, UA 5.5 5.0 - 8.0   Protein Ur, POC =  30 (A) negative mg/dL   Urobilinogen, UA 0.2 0.2 or 1.0 E.U./dL   Nitrite, UA Negative Negative   Leukocytes, UA Negative Negative    No results found.   ASSESSMENT and PLAN  Problem List Items Addressed This Visit   None   Visit Diagnoses    Burning with urination    -  Primary   Relevant Orders   POCT urinalysis dipstick (Completed)   CMP14+EGFR   Hemoglobin A1c   Urine Culture   Abdominal bloating       Relevant Medications   pantoprazole (PROTONIX) 40 MG tablet   Other Relevant Orders   US Transvaginal Non-OB   Special screening for malignant neoplasms, colon       Relevant Orders   Ambulatory referral to Gastroenterology     Plan  Urine dip positive for (Bilirubin, ketones, and protein)  Colonoscopy  Trans-vaginal ultrasound.  pap smear (in 1 month)  Take protonix daily for one month, r/se/b discussed  Follow up with lab results and urine  Mammogram scheduled for next month  Return in about 3 months (around 02/02/2021).    Huston Foley Salomon Ganser, FNP-BC [facility] [place], [place] [address] Ph.  [phone] Fax [phone]

## 2020-11-05 ENCOUNTER — Telehealth: Payer: Self-pay

## 2020-11-05 LAB — CMP14+EGFR
ALT: 14 IU/L (ref 0–32)
AST: 14 IU/L (ref 0–40)
Albumin/Globulin Ratio: 1.4 (ref 1.2–2.2)
Albumin: 4.6 g/dL (ref 3.8–4.9)
Alkaline Phosphatase: 101 IU/L (ref 44–121)
BUN/Creatinine Ratio: 15 (ref 9–23)
BUN: 14 mg/dL (ref 6–24)
Bilirubin Total: 0.5 mg/dL (ref 0.0–1.2)
CO2: 21 mmol/L (ref 20–29)
Calcium: 10.8 mg/dL — ABNORMAL HIGH (ref 8.7–10.2)
Chloride: 92 mmol/L — ABNORMAL LOW (ref 96–106)
Creatinine, Ser: 0.93 mg/dL (ref 0.57–1.00)
GFR calc Af Amer: 82 mL/min/{1.73_m2} (ref >59)
GFR calc non Af Amer: 71 mL/min/{1.73_m2} (ref >59)
Globulin, Total: 3.4 g/dL (ref 1.5–4.5)
Glucose: 100 mg/dL — ABNORMAL HIGH (ref 65–99)
Potassium: 4 mmol/L (ref 3.5–5.2)
Sodium: 136 mmol/L (ref 134–144)
Total Protein: 8 g/dL (ref 6.0–8.5)

## 2020-11-05 LAB — HEMOGLOBIN A1C
Est. average glucose Bld gHb Est-mCnc: 111 mg/dL
Hgb A1c MFr Bld: 5.5 % (ref 4.8–5.6)

## 2020-11-05 LAB — URINE CULTURE: Organism ID, Bacteria: NO GROWTH

## 2020-11-05 NOTE — Telephone Encounter (Signed)
Does she want a work note or does she Megan Mitchell need [place] to fill out the paperwork once she sends it in?

## 2020-11-05 NOTE — Telephone Encounter (Signed)
Patient called and said she has spoken with her employer today and advised her to begin FMLA paperwork to protect  Her employment with [employer], she states was cleared to be out of work until this Monday 11/09/20. She has Been out 11/02/20 on vacation day 11/03/20 thru 11/06/20 ok per employer , but she says she will try to return on  11/09/20 main reason is anxiety .

## 2020-11-09 MED FILL — TRIAMTERENE-HCTZ 37.5-25 MG: 37.5-25 | 90 days supply | Qty: 90 | Fill #1

## 2020-11-09 NOTE — Telephone Encounter (Signed)
I have not

## 2020-11-09 NOTE — Telephone Encounter (Signed)
Pt has called in  concerning her fmla paperwork. Her employer is needing her paperwork as soon as possibe. Please advise her at [phone].

## 2020-11-09 NOTE — Telephone Encounter (Signed)
have you received her FMLA form, thanks .

## 2020-11-10 ENCOUNTER — Other Ambulatory Visit: Payer: Self-pay | Admitting: Family Medicine

## 2020-11-10 NOTE — Telephone Encounter (Signed)
FAXED to Samella Parr [phone] waiting on confirmation

## 2020-11-10 NOTE — Telephone Encounter (Signed)
Per clinical  paperwork is with provider / [employer] is aware of transmission problem paperwork was cut off . The paperwork will be faxed at the latest tomorrow morning .

## 2020-11-10 NOTE — Telephone Encounter (Signed)
I am working on this

## 2020-11-10 NOTE — Telephone Encounter (Signed)
I have to get new copy, top portion of scanned document was cut off

## 2020-11-10 NOTE — Telephone Encounter (Signed)
Patient is calling back regarding FMLA paperwork for dates 11/03/2020-05/06/2021 . patient is at work and they are telling patient that they have not received it    Patient states that she was told yesterday that we had paperwork /  Patient is wainting to  make sure the paperwork gets faxed to  Samella Parr  at [phone] asap

## 2020-11-10 NOTE — Telephone Encounter (Signed)
patient missed a call fromm Lennie Odor / patient is off work now [phone] and she will answer

## 2020-11-16 MED FILL — AMLODIPINE BESYLATE 10 MG T: 10 | 90 days supply | Qty: 90 | Fill #1

## 2020-11-18 ENCOUNTER — Other Ambulatory Visit: Payer: 59

## 2020-11-23 ENCOUNTER — Telehealth: Payer: Self-pay | Admitting: Family Medicine

## 2020-11-23 ENCOUNTER — Ambulatory Visit: Payer: 59 | Admitting: Family Medicine

## 2020-11-23 ENCOUNTER — Telehealth: Payer: Self-pay

## 2020-11-23 NOTE — Telephone Encounter (Signed)
Talked with patient and advised her that I am currently awaiting benefits from [employer] (Durolane) for gel injection.  Patient then asked what could she do. I advised her that she could call her insurance and provide them with the Jcode to see how much her insurance will cover and what she would be responsible for, and if a prior authorization is required for that [place].  Patient voiced that she understands and would give her insurance a call.

## 2020-11-23 NOTE — Telephone Encounter (Signed)
VOB has been submitted.  Patient has been advised on gel injection by Jada E.

## 2020-11-23 NOTE — Telephone Encounter (Signed)
Pt called back stating you gave her the ok to schedule her appt for the gel injections so I went ahead and set it for her but I let her know I was going to run it by you to double check.

## 2020-11-23 NOTE — Telephone Encounter (Signed)
Please advise on the status of her visco supplementation authorization. I have not spoken to the patient about the groin issue yet.

## 2020-11-23 NOTE — Telephone Encounter (Signed)
Patient called regarding last message that was left about pain in her groin area she would like a call back asap. she will be at work until 3:30 if you call before then call her work number and after 3:30 call her cell phone. Work:[phone] CB:[phone]

## 2020-11-23 NOTE — Telephone Encounter (Signed)
Pt called and she would like to know if she has been approved for her gel injection. Also from the eay she has been walking she thinks she may have pulled her groin and would like to know what to do about that. Please give her a call at work until at [phone] (w) and if its after 3 call her at [phone] (c)

## 2020-11-23 NOTE — Telephone Encounter (Signed)
Patient called back to office and states that she has been waiting on authorization for gel injection x 3-4 weeks and she is hurting. She states that she has called her insurance company and that they do not have anything on file for her at all on file or anything pending. She is concerned that we have not even submitted anything for her and she continues to hurt. I called April J who asked that patient be transferred to her to discuss gel injection. Groin pain was not mentioned while I was on the phone with patient.

## 2020-11-23 NOTE — Telephone Encounter (Signed)
I called the patient back about the groin pain. She said she pulled a muscle in the groin area, due to the altered way she walks because of her knee pain. She has ibuprofen to take and tramadol for the more severe pain. Advised her to use ice or heat, whichever provides more relief, and to avoid any activities that irritate it. I changed her gel injection appt to this Friday afternoon with Dr. Junius Roads (worked better with her schedule and it is this week instead of next). I advised her that the groin pain can also be checked at that time, if it is still bothersome to her.

## 2020-11-24 ENCOUNTER — Encounter: Payer: Self-pay | Admitting: Gastroenterology

## 2020-11-24 ENCOUNTER — Telehealth: Payer: Self-pay

## 2020-11-24 NOTE — Telephone Encounter (Signed)
Noted.  Appointment is okay and patient has been approved for gel injection.

## 2020-11-24 NOTE — Telephone Encounter (Signed)
Approved, Durolane, left knee. [place] must be met before coverage applies Covered at 100% of the allowable amount Co-pay of $50.00 No PA required  Appt. 11/27/2020 with Dr. Junius Roads

## 2020-11-25 ENCOUNTER — Telehealth: Payer: Self-pay | Admitting: Family Medicine

## 2020-11-25 ENCOUNTER — Other Ambulatory Visit: Payer: Self-pay | Admitting: Family Medicine

## 2020-11-25 MED ORDER — TRAMADOL HCL 50 MG PO TABS: 50.0000 mg | ORAL_TABLET | Freq: Four times a day (QID) | ORAL | 0 refills | Status: DC | PRN

## 2020-11-25 MED FILL — traMADol HCL 50 MG TABS: 50 | 7 days supply | Qty: 30 | Fill #0

## 2020-11-25 NOTE — Telephone Encounter (Signed)
I called and advised the patient. 

## 2020-11-25 NOTE — Telephone Encounter (Signed)
Patient called. She would like something called in for pain at [place]. Her call back number is [phone] until 330. After [phone]

## 2020-11-25 NOTE — Telephone Encounter (Signed)
Please advise 

## 2020-11-27 ENCOUNTER — Other Ambulatory Visit: Payer: Self-pay

## 2020-11-27 ENCOUNTER — Encounter: Payer: Self-pay | Admitting: Family Medicine

## 2020-11-27 ENCOUNTER — Ambulatory Visit: Payer: 59 | Admitting: Family Medicine

## 2020-11-27 ENCOUNTER — Other Ambulatory Visit: Payer: Self-pay | Admitting: Family Medicine

## 2020-11-27 DIAGNOSIS — M1712 Unilateral primary osteoarthritis, left knee: Secondary | ICD-10-CM | POA: Diagnosis not present

## 2020-11-27 DIAGNOSIS — M25552 Pain in left hip: Secondary | ICD-10-CM | POA: Diagnosis not present

## 2020-11-27 MED ORDER — HYDROCODONE-ACETAMINOPHEN 5-325 MG PO TABS: 1.0000 | ORAL_TABLET | Freq: Four times a day (QID) | ORAL | 0 refills | Status: DC | PRN

## 2020-11-27 MED FILL — HYDROCODON-APAP 5-325: 5-325 | 5 days supply | Qty: 20 | Fill #0

## 2020-11-27 NOTE — Progress Notes (Signed)
   Office Visit Note   Patient: IVORIE UPLINGER           Date of Birth: Dec 24, 1970           MRN: [id number] Visit Date: 11/27/2020 Requested by: Bari Edward, FNP [address] [place],  [place] [address] PCP: Just, Laurita Quint, FNP  Subjective: Chief Complaint  Patient presents with  . Left Knee - Pain, Follow-up    Durolane injection  . left groin pain    For 3-4 days.    HPI: She is here with left hip pain.  She is also here for a planned Durolane injection for left knee osteoarthritis.  In the past 3 or 4 days she has had pain in the left groin area when getting in and out of the car or up and down out of a chair.  She thinks it is from favoring her knee.  She cannot recall a specific injury to her hip.              ROS:   All other systems were reviewed and are negative.  Objective: Vital Signs: There were no vitals taken for this visit.  Physical Exam:  General:  Alert and oriented, in no acute distress. Pulm:  Breathing unlabored. Psy:  Normal mood, congruent affect. Skin: No erythema Left hip: She has quite a bit of pain with hip flexion against resistance.  No pain with adduction or abduction against resistance.  Mild pain with passive flexion and internal rotation but she still has pretty good range of motion. Left knee: 1+ effusion with no warmth.   Imaging: No results found.  Assessment & Plan: 1.  Left knee osteoarthritis -Durolane injection today.  Follow-up as needed.  2.  Left hip pain, possibly muscular strain secondary to favoring the left knee -Physical therapy.  If pain persist, x-rays.     Procedures: Left knee injection: After sterile prep with Betadine, injected 3 cc 0.25% bupivacaine without epinephrine, then Durolane from superolateral approach.  A flash of clear yellow synovial fluid was obtained prior to injection, confirming intra-articular placement.       PMFS History: Patient Active Problem List   Diagnosis Date Noted  . Anemia  12/26/2014  . HTN (hypertension) 03/22/2014  . Severe obesity (BMI >= 40) ([place]) 03/22/2014   Past Medical History:  Diagnosis Date  . Anemia   . Hypertension     Family History  Problem Relation Age of Onset  . Hypertension Mother     Past Surgical History:  Procedure Laterality Date  . CHOLECYSTECTOMY    . ELBOW SURGERY    . TUBAL LIGATION     Social History   Occupational History  . Not on file  Tobacco Use  . Smoking status: Never Smoker  . Smokeless tobacco: Never Used  Vaping Use  . Vaping Use: Never used  Substance and Sexual Activity  . Alcohol use: No  . Drug use: No  . Sexual activity: Yes    Birth control/protection: Pill

## 2020-11-30 ENCOUNTER — Telehealth: Payer: Self-pay

## 2020-11-30 ENCOUNTER — Ambulatory Visit: Payer: 59 | Admitting: Family Medicine

## 2020-11-30 ENCOUNTER — Other Ambulatory Visit: Payer: Self-pay | Admitting: Family Medicine

## 2020-11-30 MED ORDER — BACLOFEN 10 MG PO TABS: 5.0000 mg | ORAL_TABLET | Freq: Three times a day (TID) | ORAL | 3 refills | Status: DC | PRN

## 2020-11-30 MED FILL — BACLOFEN 10 MG TABS: 10 | 10 days supply | Qty: 30 | Fill #0

## 2020-11-30 NOTE — Telephone Encounter (Signed)
Patient called she is requesting a rx for muscle relaxer's to be sent to [facility]. [phone]

## 2020-11-30 NOTE — Telephone Encounter (Signed)
Please advise 

## 2020-11-30 NOTE — Telephone Encounter (Signed)
I called and left voice mail that Baclofen was sent in for her.

## 2020-12-07 ENCOUNTER — Telehealth: Payer: Self-pay | Admitting: *Deleted

## 2020-12-07 NOTE — Telephone Encounter (Signed)
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 SHE WOULD LIKE TO TRY AN RESCHEDULE BECAUSE SHE IS NOT SURE IF SHE CAN GET SOMEONE TO WORK IN HER PLACE. BEST PHONE [phone] (CELL) [place]

## 2020-12-08 ENCOUNTER — Ambulatory Visit: Payer: 59 | Admitting: Registered Nurse

## 2020-12-10 ENCOUNTER — Ambulatory Visit: Payer: 59 | Admitting: Family Medicine

## 2020-12-11 ENCOUNTER — Other Ambulatory Visit: Payer: Self-pay | Admitting: Family Medicine

## 2020-12-11 ENCOUNTER — Ambulatory Visit: Payer: 59 | Admitting: Family Medicine

## 2020-12-11 DIAGNOSIS — R140 Abdominal distension (gaseous): Secondary | ICD-10-CM

## 2020-12-11 MED FILL — PANTOPRAZOLE SOD DR 40 MG T: 40 | 30 days supply | Qty: 30 | Fill #0

## 2020-12-17 ENCOUNTER — Ambulatory Visit: Payer: 59 | Admitting: Physical Therapy

## 2020-12-30 ENCOUNTER — Encounter: Payer: 59 | Admitting: Physical Therapy

## 2021-01-04 ENCOUNTER — Other Ambulatory Visit: Payer: Self-pay

## 2021-01-04 ENCOUNTER — Encounter: Payer: 59 | Admitting: Physical Therapy

## 2021-01-04 ENCOUNTER — Telehealth (INDEPENDENT_AMBULATORY_CARE_PROVIDER_SITE_OTHER): Payer: 59 | Admitting: Family Medicine

## 2021-01-04 ENCOUNTER — Encounter: Payer: Self-pay | Admitting: Family Medicine

## 2021-01-04 ENCOUNTER — Other Ambulatory Visit: Payer: Self-pay | Admitting: Family Medicine

## 2021-01-04 DIAGNOSIS — F411 Generalized anxiety disorder: Secondary | ICD-10-CM | POA: Diagnosis not present

## 2021-01-04 MED ORDER — SERTRALINE HCL 100 MG PO TABS: 100.0000 mg | ORAL_TABLET | Freq: Every day | ORAL | 1 refills | Status: DC

## 2021-01-04 MED FILL — SERTRALINE HCL 100 MG TABS: 100 | 90 days supply | Qty: 90 | Fill #0

## 2021-01-04 NOTE — Patient Instructions (Addendum)
  [email or web address]">  Generalized Anxiety Disorder, Adult Generalized anxiety disorder (GAD) is a mental health condition. Unlike normal worries, anxiety related to GAD is not triggered by a specific event. These worries do not fade or get better with time. GAD interferes with relationships, work, and school. GAD symptoms can vary from mild to severe. People with severe GAD can have intense waves of anxiety with physical symptoms that are similar to panic attacks. What are the causes? The exact cause of GAD is not known, but the following are believed to have an impact:  Differences in natural brain chemicals.  Genes passed down from parents to children.  Differences in the way threats are perceived.  Development during childhood.  Personality. What increases the risk? The following factors may make you more likely to develop this condition:  Being female.  Having a family history of anxiety disorders.  Being very shy.  Experiencing very stressful life events, such as the death of a loved one.  Having a very stressful family environment. What are the signs or symptoms? People with GAD often worry excessively about many things in their lives, such as their health and family. Symptoms may also include:  Mental and emotional symptoms: ? Worrying excessively about natural disasters. ? Fear of being late. ? Difficulty concentrating. ? Fears that others are judging your performance.  Physical symptoms: ? Fatigue. ? Headaches, muscle tension, muscle twitches, trembling, or feeling shaky. ? Feeling like your heart is pounding or beating very fast. ? Feeling out of breath or like you cannot take a deep breath. ? Having trouble falling asleep or staying asleep, or experiencing restlessness. ? Sweating. ? Nausea, diarrhea, or irritable bowel syndrome (IBS).  Behavioral symptoms: ? Experiencing erratic moods or irritability. ? Avoidance of new situations. ? Avoidance of  people. ? Extreme difficulty making decisions. How is this diagnosed? This condition is diagnosed based on your symptoms and medical history. You will also have a physical exam. Your health care provider may perform tests to rule out other possible causes of your symptoms. To be diagnosed with GAD, a person must have anxiety that:  Is out of his or her control.  Affects several different aspects of his or her life, such as work and relationships.  Causes distress that makes him or her unable to take part in normal activities.  Includes at least three symptoms of GAD, such as restlessness, fatigue, trouble concentrating, irritability, muscle tension, or sleep problems. Before your health care provider can confirm a diagnosis of GAD, these symptoms must be present more days than they are not, and they must last for 6 months or longer. How is this treated? This condition may be treated with:  Medicine. Antidepressant medicine is usually prescribed for long-term daily control. Anti-anxiety medicines may be added in severe cases, especially when panic attacks occur.  Talk therapy (psychotherapy). Certain types of talk therapy can be helpful in treating GAD by providing support, education, and guidance. Options include: ? Cognitive behavioral therapy (CBT). People learn coping skills and self-calming techniques to ease their physical symptoms. They learn to identify unrealistic thoughts and behaviors and to replace them with more appropriate thoughts and behaviors. ? Acceptance and commitment therapy (ACT). This treatment teaches people how to be mindful as a way to cope with unwanted thoughts and feelings. ? Biofeedback. This process trains you to manage your body's response (physiological response) through breathing techniques and relaxation methods. You will work with a therapist while machines are used to monitor   your physical symptoms.  Stress management techniques. These include yoga,  meditation, and exercise. A mental health specialist can help determine which treatment is best for you. Some people see improvement with one type of therapy. However, other people require a combination of therapies.   Follow these instructions at home: Lifestyle  Maintain a consistent routine and schedule.  Anticipate stressful situations. Create a plan, and allow extra time to work with your plan.  Practice stress management or self-calming techniques that you have learned from your therapist or your health care provider. General instructions  Take over-the-counter and prescription medicines only as told by your health care provider.  Understand that you are likely to have setbacks. Accept this and be kind to yourself as you persist to take better care of yourself.  Recognize and accept your accomplishments, even if you judge them as small.  Keep all follow-up visits as told by your health care provider. This is important. Contact a health care provider if:  Your symptoms do not get better.  Your symptoms get worse.  You have signs of depression, such as: ? A persistently sad or irritable mood. ? Loss of enjoyment in activities that used to bring you joy. ? Change in weight or eating. ? Changes in sleeping habits. ? Avoiding friends or family members. ? Loss of energy for normal tasks. ? Feelings of guilt or worthlessness. Get help right away if:  You have serious thoughts about hurting yourself or others. If you ever feel like you may hurt yourself or others, or have thoughts about taking your own life, get help right away. Go to your nearest emergency department or:  Call your local emergency services (911 in the [place]  Call a suicide crisis helpline, such as the National Suicide Prevention Lifeline at 1-800-273-8255. This is open 24 hours a day in the [place]  Text the Crisis Text Line at 741741 (in the [place] Summary  Generalized anxiety disorder (GAD) is a mental  health condition that involves worry that is not triggered by a specific event.  People with GAD often worry excessively about many things in their lives, such as their health and family.  GAD may cause symptoms such as restlessness, trouble concentrating, sleep problems, frequent sweating, nausea, diarrhea, headaches, and trembling or muscle twitching.  A mental health specialist can help determine which treatment is best for you. Some people see improvement with one type of therapy. However, other people require a combination of therapies. This information is not intended to replace advice given to you by your health care provider. Make sure you discuss any questions you have with your health care provider. Document Revised: 08/07/2019 Document Reviewed: 08/07/2019 Elsevier Patient Education  2021 Elsevier Inc.  If you have lab work done today you will be contacted with your lab results within the next 2 weeks.  If you have not heard from us then please contact us. The fastest way to get your results is to register for My Chart.   IF you received an x-ray today, you will receive an invoice from [facility]. Please contact [facility] at [phone] with questions or concerns regarding your invoice.   IF you received labwork today, you will receive an invoice from [facility]. Please contact [facility] at [phone] with questions or concerns regarding your invoice.   Our billing staff will not be able to assist you with questions regarding bills from these companies.  You will be contacted with the lab results as soon as they are   available. The fastest way to get your results is to activate your My Chart account. Instructions are located on the last page of this paperwork. If you have not heard from us regarding the results in 2 weeks, please contact this office.      

## 2021-01-04 NOTE — Progress Notes (Signed)
Virtual Visit Note  I connected with patient on 01/04/21 at 1200 by telephone due to unable to work Epic video visit and verified that I am speaking with the correct person using two identifiers. Megan Mitchell is currently located at home and no family members are currently with them during visit. The provider, Laurita Quint Chelsea Pedretti, FNP is located in their office at time of visit.  I discussed the limitations, risks, security and privacy concerns of performing an evaluation and management service by telephone and the availability of in person appointments. I also discussed with the patient that there may be a patient responsible charge related to this service. The patient expressed understanding and agreed to proceed.    I provided 20 minutes of non-face-to-face time during this encounter.  Chief Complaint  Patient presents with  . Anxiety    Lack of appetite and bloating - Gi appt scheduled for April   . anxiety / depression     Phq 9- 19 Gad 7 - 20    HPI ? 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 Sulfa Antibiotics Other (See Comments)    Makes mouth break out with a rash    Prior to Admission medications   Medication Sig Start Date End Date Taking? Authorizing  Provider  amLODipine (NORVASC) 10 MG tablet Take 1 tablet (10 mg total) by mouth daily. 03/26/20  Yes Jacelyn Pi, Lilia Argue, MD  baclofen (LIORESAL) 10 MG tablet Take 0.5-1 tablets (5-10 mg total) by mouth 3 (three) times daily as needed for muscle spasms. 11/30/20  Yes Hilts, Legrand Como, MD  ibuprofen (ADVIL) 400 MG tablet  01/08/20  Yes [provider]  losartan (COZAAR) 50 MG tablet Take 1 tablet (50 mg total) by mouth daily. 03/26/20  Yes Jacelyn Pi, Irma M, MD  pantoprazole (PROTONIX) 40 MG tablet TAKE 1 TABLET (40 MG TOTAL) BY MOUTH DAILY. 12/11/20  Yes Sixto Bowdish, Laurita Quint, FNP  potassium chloride SA (KLOR-CON) 20 MEQ tablet Take 1 tablet (20 mEq total) by mouth daily. 08/11/20  Yes Jacelyn Pi, Lilia Argue, MD  triamterene-hydrochlorothiazide (MAXZIDE-25) 37.5-25 MG tablet Take 1 tablet by mouth daily. 08/11/20  Yes Jacelyn Pi, Lilia Argue, MD  HYDROcodone-acetaminophen (NORCO/VICODIN) 5-325 MG tablet Take 1 tablet by mouth every 6 (six) hours as needed for moderate pain. Patient not taking: Reported on 01/04/2021 11/27/20   Hilts, Legrand Como, MD  meloxicam (MOBIC) 15 MG tablet Take 1 tablet (15 mg total) by mouth daily. Patient not taking: No sig reported 04/07/20   Maximiano Coss, NP  methocarbamol (ROBAXIN) 500 MG tablet Take 1 tablet (500 mg total) by mouth 4 (four) times daily. Patient not taking: No sig reported 04/07/20   Maximiano Coss, NP  traMADol (ULTRAM) 50 MG tablet Take 1  tablet (50 mg total) by mouth every 6 (six) hours as needed. Patient not taking: Reported on 01/04/2021 11/25/20   Eunice Blase, MD    Past Medical History:  Diagnosis Date  . Anemia   . Hypertension     Past Surgical History:  Procedure Laterality Date  . CHOLECYSTECTOMY    . ELBOW SURGERY    . TUBAL LIGATION      Social History   Tobacco Use  . Smoking status: Never Smoker  . Smokeless tobacco: Never Used  Substance Use Topics  . Alcohol use: No    Family History  Problem Relation Age of Onset  .  Hypertension Mother     Review of Systems  Constitutional: Positive for malaise/fatigue.  Respiratory: Negative for cough and sputum production.   Cardiovascular: Negative for chest pain and palpitations.  Gastrointestinal:       Abdominal bloating  Musculoskeletal: Negative for myalgias.  Neurological: Negative for dizziness and headaches.  Psychiatric/Behavioral: Negative for depression and suicidal ideas. The patient is nervous/anxious and has insomnia.     Objective  Constitutional:      General: Not in acute distress.    Appearance: Normal appearance. Not ill-appearing.   Pulmonary:     Effort: Pulmonary effort is normal. No respiratory distress.  Neurological:     Mental Status: Alert and oriented to person, place, and time.  Psychiatric:        Mood and Affect: Mood normal.        Behavior: Behavior normal.     ASSESSMENT and PLAN  Problem List Items Addressed This Visit   None   Visit Diagnoses    GAD (generalized anxiety disorder)    -  Primary   Relevant Medications   sertraline (ZOLOFT) 100 MG tablet       Plan   Start daily Zoloft, r/se/b discussed  RTC/ED precautions provided  Declined counseling and psychiatry referral at this time  Return in about 4 weeks (around 02/01/2021), or if symptoms worsen or fail to improve.   The above assessment and management plan was discussed with the patient. The patient verbalized understanding of and has agreed to the management plan. Patient is aware to call the clinic if symptoms persist or worsen. Patient is aware when to return to the clinic for a follow-up visit. Patient educated on when it is appropriate to go to the emergency department.    Huston Foley Ellenore Roscoe, FNP-BC [facility] at [facility], [place] [address] Ph.  [phone] Fax [phone]

## 2021-01-06 ENCOUNTER — Encounter: Payer: 59 | Admitting: Physical Therapy

## 2021-01-11 ENCOUNTER — Encounter: Payer: 59 | Admitting: Physical Therapy

## 2021-01-13 ENCOUNTER — Other Ambulatory Visit: Payer: Self-pay

## 2021-01-13 ENCOUNTER — Ambulatory Visit
Admission: RE | Admit: 2021-01-13 | Discharge: 2021-01-13 | Disposition: A | Discharge status: Home / Self Care | Payer: 59 | Source: Ambulatory Visit | Attending: Family Medicine | Admitting: Family Medicine

## 2021-01-13 DIAGNOSIS — Z1231 Encounter for screening mammogram for malignant neoplasm of breast: Secondary | ICD-10-CM

## 2021-01-14 ENCOUNTER — Encounter: Payer: 59 | Admitting: Physical Therapy

## 2021-01-14 ENCOUNTER — Other Ambulatory Visit: Payer: 59

## 2021-01-15 ENCOUNTER — Encounter: Payer: 59 | Admitting: Gastroenterology

## 2021-01-18 ENCOUNTER — Encounter: Payer: 59 | Admitting: Physical Therapy

## 2021-01-19 ENCOUNTER — Other Ambulatory Visit: Payer: Self-pay

## 2021-01-19 ENCOUNTER — Ambulatory Visit: Payer: 59 | Admitting: Family Medicine

## 2021-01-19 ENCOUNTER — Encounter: Payer: Self-pay | Admitting: Family Medicine

## 2021-01-19 ENCOUNTER — Other Ambulatory Visit: Payer: Self-pay | Admitting: Family Medicine

## 2021-01-19 VITALS — BP 124/73 | HR 81 | Ht 67.0 in | Wt 256.0 lb

## 2021-01-19 DIAGNOSIS — Z7689 Persons encountering health services in other specified circumstances: Secondary | ICD-10-CM

## 2021-01-19 DIAGNOSIS — E876 Hypokalemia: Secondary | ICD-10-CM | POA: Diagnosis not present

## 2021-01-19 DIAGNOSIS — Z09 Encounter for follow-up examination after completed treatment for conditions other than malignant neoplasm: Secondary | ICD-10-CM | POA: Diagnosis not present

## 2021-01-19 DIAGNOSIS — I10 Essential (primary) hypertension: Secondary | ICD-10-CM

## 2021-01-19 DIAGNOSIS — Z0000 Encounter for general adult medical examination without abnormal findings: Secondary | ICD-10-CM

## 2021-01-19 DIAGNOSIS — F411 Generalized anxiety disorder: Secondary | ICD-10-CM

## 2021-01-19 MED ORDER — SERTRALINE HCL 100 MG PO TABS
100.0000 mg | ORAL_TABLET | Freq: Every day | ORAL | 3 refills | Status: DC
Start: 2021-01-19 — End: 2021-01-19

## 2021-01-19 MED ORDER — TRIAMTERENE-HCTZ 37.5-25 MG PO TABS: 1.0000 | ORAL_TABLET | Freq: Every day | ORAL | 3 refills | Status: DC

## 2021-01-19 MED ORDER — LOSARTAN POTASSIUM 50 MG PO TABS: 50.0000 mg | ORAL_TABLET | Freq: Every day | ORAL | 3 refills | Status: DC

## 2021-01-19 MED ORDER — AMLODIPINE BESYLATE 10 MG PO TABS: 10.0000 mg | ORAL_TABLET | Freq: Every day | ORAL | 3 refills | Status: DC

## 2021-01-19 MED FILL — LOSARTAN POTASSIUM 50 MG TA: 50 | 90 days supply | Qty: 90 | Fill #0

## 2021-01-19 NOTE — Progress Notes (Signed)
[name]   New Patient--Establish Care  Subjective:  Patient ID: Megan Mitchell, female    DOB: Mar 20, 1971  Age: 50 y.o. MRN: [id number]  CC:  Chief Complaint  Patient presents with  . Establish Care   HPI NAOMEE Mitchell is a 50 year old female who presents to [facility] today.   Patient Active Problem List   Diagnosis Date Noted  . Anemia 12/26/2014  . HTN (hypertension) 03/22/2014  . Severe obesity (BMI >= 40) ([name]) 03/22/2014   Current Status: This will be [name]'s initial office visit with me. She was previously seeing Huston Foley Just, NP for her PCP needs. Since her last office visit, she is doing well with no complaints. She denies visual changes, chest pain, cough, shortness of breath, heart palpitations, and falls. She has occasional headaches and dizziness with position changes. Denies severe headaches, confusion, seizures, double vision, and blurred vision, nausea and vomiting. She denies fevers, chills, fatigue, recent infections, weight loss, and night sweats. Denies GI problems such as diarrhea, and constipation. She has no reports of blood in stools, dysuria and hematuria. No depression or anxiety reported today. She is taking all medications as prescribed. She denies pain today.   Past Medical History:  Diagnosis Date  . Anemia   . Hypertension     Past Surgical History:  Procedure Laterality Date  . CHOLECYSTECTOMY    . ELBOW SURGERY    . TUBAL LIGATION      Family History  Problem Relation Age of Onset  . Hypertension Mother     Social History   Socioeconomic History  . Marital status: Divorced    Spouse name: Not on file  . Number of children: 2  . Years of education: Not on file  . Highest education level: Not on file  Occupational History  . Not on file  Tobacco Use  . Smoking status: Never Smoker  . Smokeless tobacco: Never Used  Vaping Use  . Vaping Use: Never used  Substance and  Sexual Activity  . Alcohol use: No  . Drug use: No  . Sexual activity: Yes    Birth control/protection: None  Other Topics Concern  . Not on file  Social History Narrative  . Not on file   Social Determinants of Health   Financial Resource Strain: Not on file  Food Insecurity: Not on file  Transportation Needs: Not on file  Physical Activity: Not on file  Stress: Not on file  Social Connections: Not on file  Intimate Partner Violence: Not on file    Outpatient Medications Prior to Visit  Medication Sig Dispense Refill  . Multiple Vitamin (MULTIVITAMIN) tablet Take 1 tablet by mouth daily.    . Probiotic Product (PROBIOTIC PO) Take by mouth.    Marland Kitchen amLODipine (NORVASC) 10 MG tablet Take 1 tablet (10 mg total) by mouth daily. 90 tablet 3  . ibuprofen (ADVIL) 400 MG tablet     . losartan (COZAAR) 50 MG tablet Take 1 tablet (50 mg total) by mouth daily. 90 tablet 3  . sertraline (ZOLOFT) 100 MG tablet Take 1 tablet (100 mg total) by mouth daily. 90 tablet 1  . triamterene-hydrochlorothiazide (MAXZIDE-25) 37.5-25 MG tablet Take 1 tablet by mouth daily. 90 tablet 1  . baclofen (LIORESAL) 10 MG tablet Take 0.5-1 tablets (5-10 mg total) by mouth 3 (three) times daily as needed for muscle spasms. 30 each 3  . HYDROcodone-acetaminophen (NORCO/VICODIN) 5-325 MG  tablet Take 1 tablet by mouth every 6 (six) hours as needed for moderate pain. (Patient not taking: Reported on 01/04/2021) 20 tablet 0  . meloxicam (MOBIC) 15 MG tablet Take 1 tablet (15 mg total) by mouth daily. (Patient not taking: No sig reported) 30 tablet 0  . methocarbamol (ROBAXIN) 500 MG tablet Take 1 tablet (500 mg total) by mouth 4 (four) times daily. (Patient not taking: No sig reported) 60 tablet 0  . pantoprazole (PROTONIX) 40 MG tablet TAKE 1 TABLET (40 MG TOTAL) BY MOUTH DAILY. 30 tablet 0  . potassium chloride SA (KLOR-CON) 20 MEQ tablet Take 1 tablet (20 mEq total) by mouth daily. (Patient not taking: Reported on  01/19/2021) 7 tablet 0  . traMADol (ULTRAM) 50 MG tablet Take 1 tablet (50 mg total) by mouth every 6 (six) hours as needed. (Patient not taking: Reported on 01/04/2021) 30 tablet 0   No facility-administered medications prior to visit.    Allergies  Allergen Reactions  . Sulfa Antibiotics Other (See Comments)    Makes mouth break out with a rash    ROS Review of Systems  Constitutional: Negative.   HENT: Negative.   Eyes: Negative.   Respiratory: Negative.   Cardiovascular: Negative.   Gastrointestinal: Negative.   Endocrine: Negative.   Genitourinary: Negative.   Musculoskeletal: Negative.   Skin: Negative.   Allergic/Immunologic: Negative.   Neurological: Positive for dizziness (occasional ) and headaches (occasional ).  Hematological: Negative.   Psychiatric/Behavioral: Negative.       Objective:    Physical Exam Vitals and nursing note reviewed.  Constitutional:      Appearance: Normal appearance.  HENT:     Head: Normocephalic and atraumatic.     Nose: Nose normal.     Mouth/Throat:     Mouth: Mucous membranes are moist.     Pharynx: Oropharynx is clear.  Cardiovascular:     Rate and Rhythm: Normal rate and regular rhythm.     Pulses: Normal pulses.     Heart sounds: Normal heart sounds.  Pulmonary:     Effort: Pulmonary effort is normal.     Breath sounds: Normal breath sounds.  Abdominal:     General: Bowel sounds are normal.     Palpations: Abdomen is soft.  Musculoskeletal:        General: Normal range of motion.     Cervical back: Normal range of motion and neck supple.  Skin:    General: Skin is warm and dry.  Neurological:     General: No focal deficit present.     Mental Status: She is alert and oriented to person, place, and time.  Psychiatric:        Mood and Affect: Mood normal.        Behavior: Behavior normal.        Thought Content: Thought content normal.        Judgment: Judgment normal.     BP 124/73   Pulse 81   Ht 5\' 7"   (1.702 m)   Wt 256 lb (116.1 kg)   LMP 06/14/2020 (Approximate)   SpO2 100%   BMI 40.10 kg/m  Wt Readings from Last 3 Encounters:  01/19/21 256 lb (116.1 kg)  11/04/20 262 lb (118.8 kg)  07/24/20 269 lb 3.2 oz (122.1 kg)     There are no preventive care reminders to display for this patient.  There are no preventive care reminders to display for this patient.  Lab Results  Component Value  Date   TSH 1.680 03/24/2020   Lab Results  Component Value Date   WBC 9.4 11/30/2018   HGB 12.8 11/30/2018   HCT 38.4 11/30/2018   MCV 80 11/30/2018   PLT 587 (H) 11/30/2018   Lab Results  Component Value Date   NA 136 11/04/2020   K 4.0 11/04/2020   CO2 21 11/04/2020   GLUCOSE 100 (H) 11/04/2020   BUN 14 11/04/2020   CREATININE 0.93 11/04/2020   BILITOT 0.5 11/04/2020   ALKPHOS 101 11/04/2020   AST 14 11/04/2020   ALT 14 11/04/2020   PROT 8.0 11/04/2020   ALBUMIN 4.6 11/04/2020   CALCIUM 10.8 (H) 11/04/2020   ANIONGAP 13 11/04/2016   Lab Results  Component Value Date   CHOL 204 (H) 03/24/2020   Lab Results  Component Value Date   HDL 67 03/24/2020   Lab Results  Component Value Date   LDLCALC 125 (H) 03/24/2020   Lab Results  Component Value Date   TRIG 68 03/24/2020   Lab Results  Component Value Date   CHOLHDL 3.0 03/24/2020   Lab Results  Component Value Date   HGBA1C 5.5 11/04/2020   Assessment & Plan:   1. Encounter to establish care  2. Primary hypertension The current medical regimen is effective; blood pressur is stable at 124/73 today; continue present plan and medications as prescribed. She will continue to take medications as prescribed, to decrease high sodium intake, excessive alcohol intake, increase potassium intake, smoking cessation, and increase physical activity of at least 30 minutes of cardio activity daily. She will continue to follow Heart Healthy or DASH diet.  3. Hypokalemia Most recent potassium level within normal range.   4.  Generalized anxiety disorder Stable today.   5. Healthcare maintenance  6. Follow up She will follow up in 6 months.   Meds ordered this encounter  Medications  . amLODipine (NORVASC) 10 MG tablet    Sig: Take 1 tablet (10 mg total) by mouth daily.    Dispense:  90 tablet    Refill:  3  . losartan (COZAAR) 50 MG tablet    Sig: Take 1 tablet (50 mg total) by mouth daily.    Dispense:  90 tablet    Refill:  3  . sertraline (ZOLOFT) 100 MG tablet    Sig: Take 1 tablet (100 mg total) by mouth daily.    Dispense:  90 tablet    Refill:  3  . triamterene-hydrochlorothiazide (MAXZIDE-25) 37.5-25 MG tablet    Sig: Take 1 tablet by mouth daily.    Dispense:  90 tablet    Refill:  3    No orders of the defined types were placed in this encounter.   Referral Orders  No referral(s) requested today    Kathe Becton, MSN, ANE, FNP-BC [facility] [address] [place], [place] [address] [phone]- fax  Problem List Items Addressed This Visit   None   Visit Diagnoses    Encounter to establish care    -  Primary   Essential hypertension       Relevant Medications   amLODipine (NORVASC) 10 MG tablet   losartan (COZAAR) 50 MG tablet   triamterene-hydrochlorothiazide (MAXZIDE-25) 37.5-25 MG tablet   Hypokalemia       GAD (generalized anxiety disorder)       Relevant Medications   sertraline (ZOLOFT) 100 MG tablet   Healthcare maintenance  Follow up          Meds ordered this encounter  Medications  . amLODipine (NORVASC) 10 MG tablet    Sig: Take 1 tablet (10 mg total) by mouth daily.    Dispense:  90 tablet    Refill:  3  . losartan (COZAAR) 50 MG tablet    Sig: Take 1 tablet (50 mg total) by mouth daily.    Dispense:  90 tablet    Refill:  3  . sertraline (ZOLOFT) 100 MG tablet    Sig: Take 1 tablet (100 mg total) by mouth daily.    Dispense:  90 tablet     Refill:  3  . triamterene-hydrochlorothiazide (MAXZIDE-25) 37.5-25 MG tablet    Sig: Take 1 tablet by mouth daily.    Dispense:  90 tablet    Refill:  3    Follow-up: No follow-ups on file.    Azzie Glatter, FNP

## 2021-01-19 NOTE — Patient Instructions (Signed)

## 2021-01-20 ENCOUNTER — Encounter: Payer: Self-pay | Admitting: Family Medicine

## 2021-01-20 ENCOUNTER — Encounter: Payer: 59 | Admitting: Physical Therapy

## 2021-01-21 ENCOUNTER — Encounter: Payer: 59 | Admitting: Family Medicine

## 2021-01-22 ENCOUNTER — Encounter: Payer: 59 | Admitting: Registered Nurse

## 2021-01-25 ENCOUNTER — Encounter: Payer: 59 | Admitting: Physical Therapy

## 2021-01-25 DIAGNOSIS — Z124 Encounter for screening for malignant neoplasm of cervix: Secondary | ICD-10-CM | POA: Diagnosis not present

## 2021-01-25 DIAGNOSIS — Z01419 Encounter for gynecological examination (general) (routine) without abnormal findings: Secondary | ICD-10-CM | POA: Diagnosis not present

## 2021-01-25 DIAGNOSIS — Z6841 Body Mass Index (BMI) 40.0 and over, adult: Secondary | ICD-10-CM | POA: Diagnosis not present

## 2021-01-25 DIAGNOSIS — N852 Hypertrophy of uterus: Secondary | ICD-10-CM | POA: Diagnosis not present

## 2021-01-27 ENCOUNTER — Encounter: Payer: 59 | Admitting: Physical Therapy

## 2021-02-02 ENCOUNTER — Other Ambulatory Visit: Payer: Self-pay | Admitting: Family Medicine

## 2021-02-02 ENCOUNTER — Other Ambulatory Visit (HOSPITAL_COMMUNITY): Payer: Self-pay

## 2021-02-02 ENCOUNTER — Telehealth: Payer: Self-pay

## 2021-02-02 DIAGNOSIS — Z8639 Personal history of other endocrine, nutritional and metabolic disease: Secondary | ICD-10-CM

## 2021-02-02 MED ORDER — POTASSIUM CHLORIDE ER 10 MEQ PO TBCR
10.0000 meq | EXTENDED_RELEASE_TABLET | Freq: Every day | ORAL | 1 refills | Status: DC | PRN
  Filled 2021-02-02: qty 30, 30d supply, fill #0

## 2021-02-02 NOTE — Telephone Encounter (Signed)
Refill on potassium "no more refills on it"

## 2021-02-04 ENCOUNTER — Other Ambulatory Visit (HOSPITAL_COMMUNITY): Payer: Self-pay

## 2021-02-04 ENCOUNTER — Other Ambulatory Visit (HOSPITAL_BASED_OUTPATIENT_CLINIC_OR_DEPARTMENT_OTHER): Payer: Self-pay

## 2021-02-04 MED FILL — Triamterene & Hydrochlorothiazide Tab 37.5-25 MG: ORAL | 90 days supply | Qty: 90 | Fill #0 | Status: AC

## 2021-02-15 ENCOUNTER — Other Ambulatory Visit (HOSPITAL_COMMUNITY): Payer: Self-pay

## 2021-02-15 MED ORDER — DICLOFENAC POTASSIUM 50 MG PO TABS
ORAL_TABLET | ORAL | 1 refills | Status: DC
  Filled 2021-02-15: qty 30, 15d supply, fill #0
  Filled 2021-03-08: qty 30, 15d supply, fill #1

## 2021-02-15 MED FILL — Amlodipine Besylate Tab 10 MG (Base Equivalent): ORAL | 90 days supply | Qty: 90 | Fill #0 | Status: AC

## 2021-02-16 ENCOUNTER — Ambulatory Visit (AMBULATORY_SURGERY_CENTER): Payer: 59

## 2021-02-16 ENCOUNTER — Telehealth: Payer: Self-pay

## 2021-02-16 ENCOUNTER — Other Ambulatory Visit (HOSPITAL_COMMUNITY): Payer: Self-pay

## 2021-02-16 VITALS — Ht 67.0 in | Wt 254.0 lb

## 2021-02-16 DIAGNOSIS — Z1211 Encounter for screening for malignant neoplasm of colon: Secondary | ICD-10-CM

## 2021-02-16 MED ORDER — NA SULFATE-K SULFATE-MG SULF 17.5-3.13-1.6 GM/177ML PO SOLN
1.0000 | Freq: Once | ORAL | 0 refills | Status: AC
  Filled 2021-02-16: qty 354, 1d supply, fill #0

## 2021-02-16 NOTE — Telephone Encounter (Signed)
Hi Dr Silverio Decamp,  Pt had virtual previsit today.  Colon sched for 02/26/21.  Pt denies issues with constipation during previsit but then states at the end of the visit when I was all done with the instructions  that she has "fibroids" and is having a pelvic u/s on 03/04/21 and didn't know if that would interfere with the colonoscopy, then she said she has had occasional constipation from the "fibroids", pt states she doesn't know if they are uterine fibroids and that she is not having any vaginal bleeding. Instruct pt to take miralax daily x5 days before colonoscopy and to take mag citrate on evening of 4/27 prior to clear liquid day. Please advise if you feel she should r/s after pelvic u/s or if we are ok to proceed, thanks, Di Kindle

## 2021-02-16 NOTE — Progress Notes (Signed)
No egg or soy allergy known to patient  No issues with past sedation with any surgeries or procedures Patient denies ever being told they had issues or difficulty with intubation  No FH of Malignant Hyperthermia No diet pills per patient No home 02 use per patient  No blood thinners per patient   Virtual Pre-visit  During visit denies issues with constipation but states at the end of the visit that she is having a pelvic u/s on 03/04/21 and has fibroids that are uncomfortable and she is not sure she should have the colonoscopy prior to the u/s, and that she has occasional constipation from the "fibroids", pt states she doesn't know if they are uterine fibroids and that she is not having any vaginal bleeding. Instruct pt to take miralax daily x5 days before colonoscopy and to take mag citrate on evening of 4/27 prior to clear liquid day.  Pt verb understanding. TE to Dr Silverio Decamp to check if it is ok to proceed with colonoscopy prior to pelvic u/s?  No A fib or A flutter  EMMI video to pt or via [place] 19 guidelines implemented in PV today with Pt and RN  Pt is fully vaccinated  for Covid    NO PA's for preps discussed with pt In PV today  Discussed with pt there will be an out-of-pocket cost for prep and that varies from $0 to 70 dollars   Due to the COVID-19 pandemic we are asking patients to follow certain guidelines.  Pt aware of COVID protocols and [facility] guidelines

## 2021-02-17 ENCOUNTER — Other Ambulatory Visit (HOSPITAL_COMMUNITY): Payer: Self-pay

## 2021-02-17 NOTE — Telephone Encounter (Signed)
Yes, agree with additional bowel prep to make sure she will be clear. Ok to proceed with colonoscopy. Thanks

## 2021-02-18 ENCOUNTER — Encounter: Payer: Self-pay | Admitting: Gastroenterology

## 2021-02-18 NOTE — Telephone Encounter (Signed)
Called pt to inform that Dr. Silverio Decamp was comfortable doing her colonoscopy and for her to follow the prep in detail.   She asked about taking a pain pill if needed.  Pt was advised to not take it unless it was needed, but if she did to inform the staff at [facility] when she comes d/t the use of anesthesia.  Pt voiced her understanding.

## 2021-02-22 ENCOUNTER — Other Ambulatory Visit (HOSPITAL_COMMUNITY): Payer: Self-pay

## 2021-02-26 ENCOUNTER — Ambulatory Visit (AMBULATORY_SURGERY_CENTER): Payer: 59 | Admitting: Gastroenterology

## 2021-02-26 ENCOUNTER — Other Ambulatory Visit: Payer: Self-pay

## 2021-02-26 ENCOUNTER — Encounter: Payer: Self-pay | Admitting: Gastroenterology

## 2021-02-26 VITALS — BP 107/69 | HR 98 | Temp 97.7°F | Resp 15 | Ht 67.0 in | Wt 254.0 lb

## 2021-02-26 DIAGNOSIS — D128 Benign neoplasm of rectum: Secondary | ICD-10-CM

## 2021-02-26 DIAGNOSIS — K621 Rectal polyp: Secondary | ICD-10-CM

## 2021-02-26 DIAGNOSIS — Z1211 Encounter for screening for malignant neoplasm of colon: Secondary | ICD-10-CM | POA: Diagnosis not present

## 2021-02-26 DIAGNOSIS — D123 Benign neoplasm of transverse colon: Secondary | ICD-10-CM | POA: Diagnosis not present

## 2021-02-26 MED ORDER — SODIUM CHLORIDE 0.9 % IV SOLN: 500.0000 mL | Freq: Once | INTRAVENOUS | Status: DC

## 2021-02-26 NOTE — Progress Notes (Signed)
PT taken to PACU. Monitors in place. VSS. Report given to RN. 

## 2021-02-26 NOTE — Op Note (Signed)
[facility] Patient Name: Megan Mitchell Procedure Date: 02/26/2021 3:00 PM MRN: [id number] Endoscopist: Mauri Pole , MD Age: 50 Referring MD:  Date of Birth: 01/06/1971 Gender: Female Account #: [id number] Procedure:                Colonoscopy Indications:              Screening for colorectal malignant neoplasm Medicines:                Monitored Anesthesia Care Procedure:                Pre-Anesthesia Assessment:                           - Prior to the procedure, a History and Physical                            was performed, and patient medications and                            allergies were reviewed. The patient's tolerance of                            previous anesthesia was also reviewed. The risks                            and benefits of the procedure and the sedation                            options and risks were discussed with the patient.                            All questions were answered, and informed consent                            was obtained. Prior Anticoagulants: The patient has                            taken no previous anticoagulant or antiplatelet                            agents. ASA Grade Assessment: II - A patient with                            mild systemic disease. After reviewing the risks                            and benefits, the patient was deemed in                            satisfactory condition to undergo the procedure.                           After obtaining informed consent, the colonoscope  was passed under direct vision. Throughout the                            procedure, the patient's blood pressure, pulse, and                            oxygen saturations were monitored continuously. The                            Olympus PCF-H190DL ([id number]) Colonoscope was                            introduced through the anus and advanced to the the                            cecum,  identified by appendiceal orifice and                            ileocecal valve. The colonoscopy was performed                            without difficulty. The patient tolerated the                            procedure well. The quality of the bowel                            preparation was excellent. The ileocecal valve,                            appendiceal orifice, and rectum were photographed. Scope In: 3:17:33 PM Scope Out: 3:39:22 PM Scope Withdrawal Time: 0 hours 12 minutes 52 seconds  Total Procedure Duration: 0 hours 21 minutes 49 seconds  Findings:                 The perianal and digital rectal examinations were                            normal.                           A 5 mm polyp was found in the transverse colon. The                            polyp was sessile. The polyp was removed with a                            cold snare. Resection and retrieval were complete.                           A 15 mm polyp was found in the recto-sigmoid colon.                            The polyp was multi-lobulated and pedunculated. The  polyp was removed with a hot snare. Resection and                            retrieval were complete.                           A 7 mm polyp was found in the rectum. The polyp was                            semi-pedunculated. The polyp was removed with a hot                            snare. Resection and retrieval were complete.                           Non-bleeding external and internal hemorrhoids were                            found during retroflexion. The hemorrhoids were                            medium-sized. Complications:            No immediate complications. Estimated Blood Loss:     Estimated blood loss was minimal. Impression:               - One 5 mm polyp in the transverse colon, removed                            with a cold snare. Resected and retrieved.                           - One 15 mm polyp at  the recto-sigmoid colon,                            removed with a hot snare. Resected and retrieved.                           - One 7 mm polyp in the rectum, removed with a hot                            snare. Resected and retrieved.                           - Non-bleeding external and internal hemorrhoids. Recommendation:           - Patient has a contact number available for                            emergencies. The signs and symptoms of potential                            delayed complications were discussed with the  patient. Return to normal activities tomorrow.                            Written discharge instructions were provided to the                            patient.                           - Resume previous diet.                           - Continue present medications.                           - Await pathology results.                           - Repeat colonoscopy in 3 years for surveillance                            based on pathology results. Mauri Pole, MD 02/26/2021 3:45:15 PM This report has been signed electronically.

## 2021-02-26 NOTE — Progress Notes (Signed)
Pt's states no medical or surgical changes since previsit or office visit. 

## 2021-02-26 NOTE — Patient Instructions (Signed)
YOU HAD AN ENDOSCOPIC PROCEDURE TODAY AT THE [facility]:   Refer to the procedure report that was given to you for any specific questions about what was found during the examination.  If the procedure report does not answer your questions, please call your gastroenterologist to clarify.  If you requested that your care partner not be given the details of your procedure findings, then the procedure report has been included in a sealed envelope for you to review at your convenience later.  YOU SHOULD EXPECT: Some feelings of bloating in the abdomen. Passage of more gas than usual.  Walking can help get rid of the air that was put into your GI tract during the procedure and reduce the bloating. If you had a lower endoscopy (such as a colonoscopy or flexible sigmoidoscopy) you may notice spotting of blood in your stool or on the toilet paper. If you underwent a bowel prep for your procedure, you may not have a normal bowel movement for a few days.  Please Note:  You might notice some irritation and congestion in your nose or some drainage.  This is from the oxygen used during your procedure.  There is no need for concern and it should clear up in a day or so.  SYMPTOMS TO REPORT IMMEDIATELY:   Following lower endoscopy (colonoscopy or flexible sigmoidoscopy):  Excessive amounts of blood in the stool  Significant tenderness or worsening of abdominal pains  Swelling of the abdomen that is new, acute  Fever of 100F or higher   Following upper endoscopy (EGD)  Vomiting of blood or coffee ground material  New chest pain or pain under the shoulder blades  Painful or persistently difficult swallowing  New shortness of breath  Fever of 100F or higher  Black, tarry-looking stools  For urgent or emergent issues, a gastroenterologist can be reached at any hour by calling [phone]. Do not use MyChart messaging for urgent concerns.    DIET:  We do recommend a small meal at first, but  then you may proceed to your regular diet.  Drink plenty of fluids but you should avoid alcoholic beverages for 24 hours.  ACTIVITY:  You should plan to take it easy for the rest of today and you should NOT DRIVE or use heavy machinery until tomorrow (because of the sedation medicines used during the test).    FOLLOW UP: Our staff will call the number listed on your records 48-72 hours following your procedure to check on you and address any questions or concerns that you may have regarding the information given to you following your procedure. If we do not reach you, we will leave a message.  We will attempt to reach you two times.  During this call, we will ask if you have developed any symptoms of COVID 19. If you develop any symptoms (ie: fever, flu-like symptoms, shortness of breath, cough etc.) before then, please call [phone].  If you test positive for Covid 19 in the 2 weeks post procedure, please call and report this information to us.    If any biopsies were taken you will be contacted by phone or by letter within the next 1-3 weeks.  Please call us at [phone] if you have not heard about the biopsies in 3 weeks.    SIGNATURES/CONFIDENTIALITY: You and/or your care partner have signed paperwork which will be entered into your electronic medical record.  These signatures attest to the fact that that the information above on   your After Visit Summary has been reviewed and is understood.  Full responsibility of the confidentiality of this discharge information lies with you and/or your care-partner. 

## 2021-02-26 NOTE — Progress Notes (Signed)
Called to room to assist during endoscopic procedure.  Patient ID and intended procedure confirmed with present staff. Received instructions for my participation in the procedure from the performing physician.  

## 2021-02-27 ENCOUNTER — Telehealth: Payer: Self-pay | Admitting: Physician Assistant

## 2021-02-27 NOTE — Telephone Encounter (Signed)
Telephone call 02/27/2021 10:30 AM  Patient called on-call service.  Patient explains she had a colonoscopy yesterday afternoon around 3 PM.  She felt fine afterwards and was very hungry and had a big meal.  Even still had some watery stools when she got home.  Apparently went to sleep but when she got up in the middle the night felt somewhat woozy/lightheaded and faint, she continues to feel that way now at 10:30 in the morning.  She has been laying down this morning, she was able to eat breakfast.  No further bowel movements.  No abdominal pain.  Encouraged the patient to rest today, instructed her to go get some Gatorade or Pedialyte as well as drink plenty of water and eat what ever she would like.  Likely she is just dehydrated and still feeling effects from the anesthesia.  If these things do not improve overnight instructed her to call our service tomorrow.  Ellouise Newer, PA-C

## 2021-03-01 NOTE — Telephone Encounter (Signed)
Thank you Jennifer.

## 2021-03-02 ENCOUNTER — Telehealth: Payer: Self-pay

## 2021-03-02 NOTE — Telephone Encounter (Signed)
  Follow up Call-  Call back number 02/26/2021  Post procedure Call Back phone  # [phone]  Permission to leave phone message Yes  Some recent data might be hidden     Patient questions:  Do you have a fever, pain , or abdominal swelling? No. Pain Score  0 *  Have you tolerated food without any problems? Yes.    Have you been able to return to your normal activities? Yes.  pt reports feeling a little more drained energy wise since the procedure.  She says it is getting better each day.  Do you have any questions about your discharge instructions: Diet   No. Medications  No. Follow up visit  No.  Do you have questions or concerns about your Care? No.  Actions: * If pain score is 4 or above: No action needed, pain <4.  1. Have you developed a fever since your procedure? no  2.   Have you had an respiratory symptoms (SOB or cough) since your procedure? no  3.   Have you tested positive for COVID 19 since your procedure no  4.   Have you had any family members/close contacts diagnosed with the COVID 19 since your procedure?  no   If yes to any of these questions please route to Joylene John, RN and Joella Prince, RN

## 2021-03-08 ENCOUNTER — Other Ambulatory Visit (HOSPITAL_COMMUNITY): Payer: Self-pay

## 2021-03-16 ENCOUNTER — Other Ambulatory Visit (HOSPITAL_COMMUNITY): Payer: Self-pay

## 2021-03-18 ENCOUNTER — Telehealth: Payer: Self-pay | Admitting: Gastroenterology

## 2021-03-18 NOTE — Telephone Encounter (Signed)
Inbound call from patient. Have questions about colonoscopy reports. Best contact number if before 3:30pm [phone]. If after 3:30pm [phone]

## 2021-03-18 NOTE — Telephone Encounter (Signed)
Called and gave patient path results from her colonoscopy. Let her know she should also be receiving a letter with the results.

## 2021-03-19 ENCOUNTER — Other Ambulatory Visit (HOSPITAL_COMMUNITY): Payer: Self-pay

## 2021-03-19 ENCOUNTER — Encounter: Payer: Self-pay | Admitting: Gastroenterology

## 2021-03-19 MED ORDER — HYDROCODONE-ACETAMINOPHEN 5-325 MG PO TABS
ORAL_TABLET | ORAL | 0 refills | Status: DC
  Filled 2021-03-19: qty 6, 2d supply, fill #0

## 2021-04-01 DIAGNOSIS — D259 Leiomyoma of uterus, unspecified: Secondary | ICD-10-CM | POA: Diagnosis not present

## 2021-04-01 DIAGNOSIS — N852 Hypertrophy of uterus: Secondary | ICD-10-CM | POA: Diagnosis not present

## 2021-04-01 DIAGNOSIS — R1900 Intra-abdominal and pelvic swelling, mass and lump, unspecified site: Secondary | ICD-10-CM | POA: Diagnosis not present

## 2021-04-02 ENCOUNTER — Other Ambulatory Visit: Payer: Self-pay | Admitting: Obstetrics and Gynecology

## 2021-04-02 DIAGNOSIS — R1900 Intra-abdominal and pelvic swelling, mass and lump, unspecified site: Secondary | ICD-10-CM

## 2021-04-05 ENCOUNTER — Ambulatory Visit
Admission: RE | Admit: 2021-04-05 | Discharge: 2021-04-05 | Disposition: A | Discharge status: Home / Self Care | Payer: 59 | Source: Ambulatory Visit | Attending: Obstetrics and Gynecology | Admitting: Obstetrics and Gynecology

## 2021-04-05 ENCOUNTER — Other Ambulatory Visit: Payer: Self-pay

## 2021-04-05 DIAGNOSIS — M5136 Other intervertebral disc degeneration, lumbar region: Secondary | ICD-10-CM | POA: Diagnosis not present

## 2021-04-05 DIAGNOSIS — N858 Other specified noninflammatory disorders of uterus: Secondary | ICD-10-CM | POA: Diagnosis not present

## 2021-04-05 DIAGNOSIS — K862 Cyst of pancreas: Secondary | ICD-10-CM | POA: Diagnosis not present

## 2021-04-05 DIAGNOSIS — D259 Leiomyoma of uterus, unspecified: Secondary | ICD-10-CM | POA: Diagnosis not present

## 2021-04-05 DIAGNOSIS — R1900 Intra-abdominal and pelvic swelling, mass and lump, unspecified site: Secondary | ICD-10-CM

## 2021-04-05 MED ORDER — GADOBENATE DIMEGLUMINE 529 MG/ML IV SOLN
20.0000 mL | Freq: Once | INTRAVENOUS | Status: AC | PRN
  Administered 2021-04-05: 20 mL via INTRAVENOUS

## 2021-04-06 ENCOUNTER — Other Ambulatory Visit: Payer: Self-pay | Admitting: Obstetrics and Gynecology

## 2021-04-06 DIAGNOSIS — R1900 Intra-abdominal and pelvic swelling, mass and lump, unspecified site: Secondary | ICD-10-CM

## 2021-04-07 ENCOUNTER — Encounter: Payer: Self-pay | Admitting: Gynecologic Oncology

## 2021-04-07 ENCOUNTER — Other Ambulatory Visit (HOSPITAL_COMMUNITY): Payer: Self-pay

## 2021-04-07 ENCOUNTER — Other Ambulatory Visit: Payer: Self-pay | Admitting: Obstetrics and Gynecology

## 2021-04-07 DIAGNOSIS — R1900 Intra-abdominal and pelvic swelling, mass and lump, unspecified site: Secondary | ICD-10-CM

## 2021-04-07 MED FILL — Sertraline HCl Tab 100 MG: ORAL | 90 days supply | Qty: 90 | Fill #0 | Status: AC

## 2021-04-08 ENCOUNTER — Other Ambulatory Visit (HOSPITAL_COMMUNITY): Payer: Self-pay

## 2021-04-08 ENCOUNTER — Other Ambulatory Visit: Payer: Self-pay | Admitting: Gynecologic Oncology

## 2021-04-08 ENCOUNTER — Encounter: Payer: Self-pay | Admitting: Gynecologic Oncology

## 2021-04-08 ENCOUNTER — Inpatient Hospital Stay: Payer: 59 | Attending: Gynecologic Oncology | Admitting: Gynecologic Oncology

## 2021-04-08 ENCOUNTER — Telehealth: Payer: Self-pay

## 2021-04-08 ENCOUNTER — Other Ambulatory Visit: Payer: Self-pay

## 2021-04-08 VITALS — BP 132/84 | HR 129 | Temp 97.7°F | Resp 18 | Ht 67.0 in | Wt 240.0 lb

## 2021-04-08 DIAGNOSIS — R971 Elevated cancer antigen 125 [CA 125]: Secondary | ICD-10-CM

## 2021-04-08 DIAGNOSIS — D219 Benign neoplasm of connective and other soft tissue, unspecified: Secondary | ICD-10-CM

## 2021-04-08 DIAGNOSIS — Z79899 Other long term (current) drug therapy: Secondary | ICD-10-CM | POA: Insufficient documentation

## 2021-04-08 DIAGNOSIS — R1900 Intra-abdominal and pelvic swelling, mass and lump, unspecified site: Secondary | ICD-10-CM | POA: Insufficient documentation

## 2021-04-08 DIAGNOSIS — I10 Essential (primary) hypertension: Secondary | ICD-10-CM | POA: Diagnosis not present

## 2021-04-08 DIAGNOSIS — F419 Anxiety disorder, unspecified: Secondary | ICD-10-CM | POA: Diagnosis not present

## 2021-04-08 MED ORDER — IBUPROFEN 800 MG PO TABS
800.0000 mg | ORAL_TABLET | Freq: Three times a day (TID) | ORAL | 0 refills | Status: DC | PRN
  Filled 2021-04-08: qty 30, 10d supply, fill #0

## 2021-04-08 MED ORDER — SENNOSIDES-DOCUSATE SODIUM 8.6-50 MG PO TABS
2.0000 | ORAL_TABLET | Freq: Every day | ORAL | 0 refills | Status: DC
Start: 2021-04-08 — End: 2021-09-13
  Filled 2021-04-08: qty 30, 15d supply, fill #0

## 2021-04-08 MED ORDER — TRAMADOL HCL 50 MG PO TABS
50.0000 mg | ORAL_TABLET | Freq: Four times a day (QID) | ORAL | 0 refills | Status: DC | PRN
  Filled 2021-04-08: qty 10, 3d supply, fill #0

## 2021-04-08 NOTE — Patient Instructions (Addendum)
Preparing for your Surgery  Plan for surgery on April 22, 2021 with Dr. Everitt Amber at [facility] will be scheduled for a robotic assisted total laparoscopic hysterectomy (removal of the uterus and cervix), bilateral salpingo-oophorectomy (removal of both ovaries and fallopian tubes), mini laparotomy (larger incision on your abdomen to decompress the cyst), possible staging if a cancer is identified.   Pre-operative Testing -You will receive a phone call from presurgical testing at [facility] to arrange for a pre-operative appointment and lab work.  -Bring your insurance card, copy of an advanced directive if applicable, medication list  -At that visit, you will be asked to sign a consent for a possible blood transfusion in case a transfusion becomes necessary during surgery.  The need for a blood transfusion is rare but having consent is a necessary part of your care.     -You should not be taking blood thinners or aspirin at least ten days prior to surgery unless instructed by your surgeon.  -Do not take supplements such as fish oil (omega 3), red yeast rice, turmeric before your surgery. You want to avoid medications with aspirin in them including headache powders such as BC or Goody's), Excedrin migraine.  Day Before Surgery at [facility] will be asked to take in a light diet the day before surgery. You will be advised you can have clear liquids up until 3 hours before your surgery.    Eat a light diet the day before surgery.  Examples including soups, broths, toast, yogurt, mashed potatoes.  AVOID GAS PRODUCING FOODS. Things to avoid include carbonated beverages (fizzy beverages, sodas), raw fruits and raw vegetables (uncooked), or beans.   If your bowels are filled with gas, your surgeon will have difficulty visualizing your pelvic organs which increases your surgical risks.  Your role in recovery Your role is to become active as soon as directed by your doctor,  while still giving yourself time to heal.  Rest when you feel tired. You will be asked to do the following in order to speed your recovery:  - Cough and breathe deeply. This helps to clear and expand your lungs and can prevent pneumonia after surgery.  - [place]. Do mild physical activity. Walking or moving your legs help your circulation and body functions return to normal. Do not try to get up or walk alone the first time after surgery.   -If you develop swelling on one leg or the other, pain in the back of your leg, redness/warmth in one of your legs, please call the office or go to the Emergency Room to have a doppler to rule out a blood clot. For shortness of breath, chest pain-seek care in the Emergency Room as soon as possible. - Actively manage your pain. Managing your pain lets you move in comfort. We will ask you to rate your pain on a scale of zero to 10. It is your responsibility to tell your doctor or nurse where and how much you hurt so your pain can be treated.  Special Considerations -If you are diabetic, you may be placed on insulin after surgery to have closer control over your blood sugars to promote healing and recovery.  This does not mean that you will be discharged on insulin.  If applicable, your oral antidiabetics will be resumed when you are tolerating a solid diet.  -Your final pathology results from surgery should be available around one week after surgery and the  results will be relayed to you when available.  -Dr. Lahoma Crocker is the surgeon that assists your GYN Oncologist with surgery.  If you end up staying the night, the next day after your surgery you will either see Dr. Denman George, Dr. Berline Lopes, or Dr. Lahoma Crocker.  -FMLA forms can be faxed to [phone] and please allow 5-7 business days for completion.  Pain Management After Surgery -You have been prescribed your pain medication and bowel regimen medications before surgery so that  you can have these available when you are discharged from the hospital. The pain medication is for use ONLY AFTER surgery and a new prescription will not be given.   -Make sure that you have Tylenol and Ibuprofen at home to use on a regular basis after surgery for pain control. We recommend alternating the medications every hour to six hours since they work differently and are processed in the body differently for pain relief.  -Review the attached handout on narcotic use and their risks and side effects.   Bowel Regimen -You have been prescribed Sennakot-S to take nightly to prevent constipation especially if you are taking the narcotic pain medication intermittently.  It is important to prevent constipation and drink adequate amounts of liquids. You can stop taking this medication when you are not taking pain medication and you are back on your normal bowel routine.  Risks of Surgery Risks of surgery are low but include bleeding, infection, damage to surrounding structures, re-operation, blood clots, and very rarely death.   Blood Transfusion Information (For the consent to be signed before surgery)  We will be checking your blood type before surgery so in case of emergencies, we will know what type of blood you would need.                                            WHAT IS A BLOOD TRANSFUSION?  A transfusion is the replacement of blood or some of its parts. Blood is made up of multiple cells which provide different functions. Red blood cells carry oxygen and are used for blood loss replacement. White blood cells fight against infection. Platelets control bleeding. Plasma helps clot blood. Other blood products are available for specialized needs, such as hemophilia or other clotting disorders. BEFORE THE TRANSFUSION  Who gives blood for transfusions?  You may be able to donate blood to be used at a later date on yourself (autologous donation). Relatives can be asked to donate blood.  This is generally not any safer than if you have received blood from a stranger. The same precautions are taken to ensure safety when a relative's blood is donated. Healthy volunteers who are fully evaluated to make sure their blood is safe. This is blood bank blood. Transfusion therapy is the safest it has ever been in the practice of medicine. Before blood is taken from a donor, a complete history is taken to make sure that person has no history of diseases nor engages in risky social behavior (examples are intravenous drug use or sexual activity with multiple partners). The donor's travel history is screened to minimize risk of transmitting infections, such as malaria. The donated blood is tested for signs of infectious diseases, such as HIV and hepatitis. The blood is then tested to be sure it is compatible with you in order to minimize the chance of a transfusion reaction. If you  or a relative donates blood, this is often done in anticipation of surgery and is not appropriate for emergency situations. It takes many days to process the donated blood. RISKS AND COMPLICATIONS Although transfusion therapy is very safe and saves many lives, the main dangers of transfusion include:  Getting an infectious disease. Developing a transfusion reaction. This is an allergic reaction to something in the blood you were given. Every precaution is taken to prevent this. The decision to have a blood transfusion has been considered carefully by your caregiver before blood is given. Blood is not given unless the benefits outweigh the risks.  AFTER SURGERY INSTRUCTIONS  Return to work: 4 weeks if applicable  You will have a white honeycomb dressing over your larger incision. This dressing can be removed 5 days after surgery and you do not need to reapply a new dressing. Once you remove the dressing, you will notice that you have the surgical glue (dermabond) on the incision and this will peel off on its own. You can  get this dressing wet in the shower the days after surgery prior to removal on the 5th day.   Activity: 1. Be up and out of the bed during the day.  Take a nap if needed.  You may walk up steps but be careful and use the hand rail.  Stair climbing will tire you more than you think, you may need to stop part way and rest.   2. No lifting or straining for 4-6 weeks over 10 pounds. No pushing, pulling, straining for 4-6 weeks.  3. No driving for around 1 week(s).  Do not drive if you are taking narcotic pain medicine and make sure that your reaction time has returned.   4. You can shower as soon as the next day after surgery. Shower daily.  Use your regular soap and water (not directly on the incision) and pat your incision(s) dry afterwards; don't rub.  No tub baths or submerging your body in water until cleared by your surgeon. If you have the soap that was given to you by pre-surgical testing that was used before surgery, you do not need to use it afterwards because this can irritate your incisions.   5. No sexual activity and nothing in the vagina for 8 weeks.  6. You may experience a small amount of clear drainage from your incision(s), which is normal.  If the drainage persists, increases, or changes color please call the office.  7. Do not use creams, lotions, or ointments such as neosporin on your incisions after surgery until advised by your surgeon because they can cause removal of the dermabond glue on your incisions.    8. You may experience vaginal spotting after surgery or around the 6-8 week mark from surgery when the stitches at the top of the vagina begin to dissolve.  The spotting is normal but if you experience heavy bleeding, call our office.  9. Take Tylenol or ibuprofen first for pain and only use narcotic pain medication for severe pain not relieved by the Tylenol or Ibuprofen.  Monitor your Tylenol intake to a max of 4,000 mg in a 24 hour period. You can alternate these  medications after surgery.  Diet: 1. Low sodium Heart Healthy Diet is recommended but you are cleared to resume your normal (before surgery) diet after your procedure.  2. It is safe to use a laxative, such as Miralax or Colace, if you have difficulty moving your bowels. You have been prescribed  Sennakot at bedtime every evening to keep bowel movements regular and to prevent constipation.    Wound Care: 1. Keep clean and dry.  Shower daily.  Reasons to call the Doctor: Fever - Oral temperature greater than 100.4 degrees Fahrenheit Foul-smelling vaginal discharge Difficulty urinating Nausea and vomiting Increased pain at the site of the incision that is unrelieved with pain medicine. Difficulty breathing with or without chest pain New calf pain especially if only on one side Sudden, continuing increased vaginal bleeding with or without clots.   Contacts: For questions or concerns you should contact:  Dr. Everitt Amber at [phone]  Joylene John, NP at [phone]  After Hours: call [phone] and have the GYN Oncologist paged/contacted (after 5 pm or on the weekends).  Messages sent via mychart are for non-urgent matters and are not responded to after hours so for urgent needs, please call the after hours number.

## 2021-04-08 NOTE — Progress Notes (Signed)
Gabapentin ordered for pre-op ERAS meds

## 2021-04-08 NOTE — Telephone Encounter (Signed)
Spoke with Megan Mitchell and she states that she was anxious at office visit today.  She does have a history of anxiety. No chest pain or SOB.  She will call tomorrow with an update on her pulse rate.  She does not know how to take her pulse and her husband is at work. Attempted to instruct patient on taking pulse but she could not feel it.

## 2021-04-08 NOTE — Progress Notes (Signed)
Consult Note: Gyn-Onc  Consult was requested by Earnstine Regal, NP for the evaluation of Megan Mitchell 50 y.o. female  CC:  Chief Complaint  Patient presents with   Pelvic mass    Assessment/Plan:  Megan Mitchell  is a 50 y.o.  year old with a 30 cm cystic pelvic and abdominal mass likely arising from an ovary, fibroid uterus, mildly elevated Ca1 25.  I reviewed this MRI imaging with the patient.  I explained that the predominantly cystic nature of this lesion was somewhat reassuring.  I explained the low sensitivity of mildly elevated Ca1 25 and diagnosing cancer.  Due to the large nature of this lesion, the possibility for occult cancer, and the symptomatic nature of this process, I am recommending surgical intervention.  I think she would be a good candidate for mini laparotomy for cyst decompression followed by robotic assisted total hysterectomy and BSO, possible staging if malignancy is identified on frozen section.  I explained this procedure to the patient and its potential risks.  I expect same-day discharge.  I expect 4 weeks of time off of work.  I counseled the patient regarding anticipated convalescence and restrictions postoperatively.   HPI: Megan Mitchell is a 50 year old P2 who was seen in consultation at the request of Earnstine Regal, NP for evaluation of a 30 cm pelvic mass.  The patient began experiencing abdominal distention and bloating symptoms in December 2021.  She attempted to be seen by her primary care doctor at that time however due to [place] pandemic precautions and difficulty making an appointment she was not able to make 1.  Her PCP then left the practice and she had to reestablish care with another PCP.  This was taking some time and therefore she established care with an OB/GYN provider, Earnstine Regal, NP.  Megan Florene Glen saw the patient on 01/26/21 and ordered a pelvic ultrasound scan performed ton 04/01/21 for a known history of fibroids.  The ultrasound scan  identified a uterus measuring 8.6 x 4.2 x 9.2 cm.  There were multiple intramural, submucosal, and subserosal fibroids within.  The left ovary appeared to measure 2.8 x 2 x 2.2 cm.  The right ovary was not clearly seen.  A cystic structure was seen extending from the right adnexa into the abdomen and adjacent to the liver.  It measured at least 26 cm in greatest dimension on ultrasound.  To follow-up this ultrasound finding an MRI of the pelvis and abdomen was performed on 04/05/2021.  This revealed a 30 x 18.8 x 28 cm cystic mass in the abdomen and pelvis anteriorly.  Somewhat septated complex lobulations along the inferior margin.  There was a solid component posteriorly and inferiorly measuring 5.6 cm.  There was no apparent ascites or adenopathy or peritoneal disease.  The uterus measured 12 x 11 x 11 cm on MRI dimensions.  It contained fibroids.  A Ca1 25 was drawn on 6-22 and was mildly elevated at 67.  Her medical history is most significant for hypertension, anxiety.  Her surgical history is most significant for laparoscopic cholecystectomy, tubal ligation, carpal tunnel surgery on her elbow.  Her gynecologic history is remarkable for 2 prior vaginal deliveries.  Her family cancer history is unremarkable with no first or second-degree relatives with known cancer.  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 She lives with her husband.   Current Meds:  Outpatient Encounter Medications as of 04/08/2021  Medication Sig  acetaminophen (TYLENOL) 500 MG tablet Take 500 mg by mouth every 6 (six) hours as needed.   amLODipine (NORVASC) 10 MG tablet TAKE 1 TABLET BY MOUTH DAILY.   losartan (COZAAR) 50 MG tablet TAKE 1 TABLET BY MOUTH ONCE DAILY   Probiotic Product (PROBIOTIC PO) Take by mouth.   sertraline (ZOLOFT) 100 MG tablet TAKE 1 TABLET BY MOUTH DAILY.   triamterene-hydrochlorothiazide (MAXZIDE-25) 37.5-25 MG tablet TAKE 1 TABLET BY MOUTH DAILY.   diclofenac  (CATAFLAM) 50 MG tablet Take 2 tablets by mouth initially, then 1 tablet after a meal twice daily as needed for pain. (Patient not taking: Reported on 04/07/2021)   HYDROcodone-acetaminophen (NORCO/VICODIN) 5-325 MG tablet Take 1 tablet by mouth every 6 hours as needed for breakthrough pain (Patient not taking: Reported on 04/07/2021)   Multiple Vitamin (MULTIVITAMIN) tablet Take 1 tablet by mouth daily. (Patient not taking: Reported on 04/07/2021)   [DISCONTINUED] pantoprazole (PROTONIX) 40 MG tablet TAKE 1 TABLET (40 MG TOTAL) BY MOUTH DAILY.   [DISCONTINUED] potassium chloride SA (KLOR-CON) 20 MEQ tablet Take 1 tablet (20 mEq total) by mouth daily. (Patient not taking: Reported on 01/19/2021)   No facility-administered encounter medications on file as of 04/08/2021.    Allergy:  Allergies  Allergen Reactions   Sulfa Antibiotics Other (See Comments)    Makes mouth break out with a rash    Social Hx:   Social History   Socioeconomic History   Marital status: Married    Spouse name: Not on file   Number of children: 2   Years of education: Not on file   Highest education level: Not on file  Occupational History   Not on file  Tobacco Use   Smoking status: Never   Smokeless tobacco: Never  Vaping Use   Vaping Use: Never used  Substance and Sexual Activity   Alcohol use: No   Drug use: No   Sexual activity: Yes    Birth control/protection: Surgical  Other Topics Concern   Not on file  Social History Narrative   Not on file   Social Determinants of Health   Financial Resource Strain: Not on file  Food Insecurity: Not on file  Transportation Needs: Not on file  Physical Activity: Not on file  Stress: Not on file  Social Connections: Not on file  Intimate Partner Violence: Not on file    Past Surgical Hx:  Past Surgical History:  Procedure Laterality Date   CHOLECYSTECTOMY     ELBOW SURGERY     TUBAL LIGATION      Past Medical Hx:  Past Medical History:  Diagnosis  Date   Allergy    seasonal   Anemia    Anxiety    Arthritis    left knee   Hypertension     Past Gynecological History: SVD x2, pap normal on 01/26/21 with negative high risk HPV No LMP recorded. (Menstrual status: Perimenopausal).  Family Hx:  Family History  Problem Relation Age of Onset   Hypertension Mother    Colon cancer Neg Hx    Colon polyps Neg Hx    Esophageal cancer Neg Hx    Rectal cancer Neg Hx    Stomach cancer Neg Hx     Review of Systems:  Constitutional  Feels well,    ENT Normal appearing ears and nares bilaterally Skin/Breast  No rash, sores, jaundice, itching, dryness Cardiovascular  No chest pain, shortness of breath, or edema  Pulmonary  No cough or wheeze.  Gertie Fey  Intestinal  No nausea, vomitting, or diarrhoea. No bright red blood per rectum, no abdominal pain, change in bowel movement, or constipation. + bloating and distension Genito Urinary  No frequency, urgency, dysuria, + urinary frequency Musculo Skeletal  No myalgia, arthralgia, joint swelling or pain  Neurologic  No weakness, numbness, change in gait,  Psychology  No depression, anxiety, insomnia.   Vitals:  Blood pressure 132/84, pulse (!) 129, temperature 97.7 F (36.5 C), temperature source Tympanic, resp. rate 18, height 5\' 7"  (1.702 m), weight 240 lb (108.9 kg), SpO2 100 %.  Physical Exam: WD in NAD Neck  Supple NROM, without any enlargements.  Lymph Node Survey No cervical supraclavicular or inguinal adenopathy Cardiovascular  Pulse normal rate, regularity and rhythm. S1 and S2 normal.  Lungs  Clear to auscultation bilateraly, without wheezes/crackles/rhonchi. Good air movement.  Skin  No rash/lesions/breakdown  Psychiatry  Alert and oriented to person, place, and time  Abdomen  Normoactive bowel sounds, abdomen non-tender and obese without evidence of hernia.  Very distended abdomen somewhat tense with a cystic mass filling the abdomen and pelvis to the level of the  xiphoid. Back No CVA tenderness Genito Urinary  Vulva/vagina: Normal external female genitalia.  No lesions. No discharge or bleeding.  Bladder/urethra:  No lesions or masses, well supported bladder  Vagina: normal  Cervix: Normal appearing, no lesions.  Uterus:  Bulky mobile, no parametrial involvement or nodularity. There is calcified hardness posteriorally most consistent with fibroids  Adnexa: abdomino pelvic mass extending to xiphoid appreciated Rectal  Good tone, no masses no cul de sac nodularity however the calcified posterior uterus is appreciated.  Extremities  No bilateral cyanosis, clubbing or edema.  60 minutes of total time was spent for this patient encounter, including preparation, face-to-face counseling with the patient and coordination of care, review of imaging (results and images), communication with the referring provider and documentation of the encounter.   Thereasa Solo, MD  04/08/2021, 9:39 AM

## 2021-04-09 ENCOUNTER — Telehealth: Payer: Self-pay

## 2021-04-09 ENCOUNTER — Other Ambulatory Visit: Payer: Self-pay | Admitting: Nurse Practitioner

## 2021-04-09 ENCOUNTER — Other Ambulatory Visit (HOSPITAL_COMMUNITY): Payer: Self-pay

## 2021-04-09 MED ORDER — SERTRALINE HCL 25 MG PO TABS
25.0000 mg | ORAL_TABLET | Freq: Every day | ORAL | 3 refills | Status: DC
  Filled 2021-04-09: qty 90, 90d supply, fill #0

## 2021-04-09 NOTE — Telephone Encounter (Signed)
Megan Mitchell called stating that her pulse was 85 last evening. This morning it is 65.  She feels fine this am.

## 2021-04-09 NOTE — Telephone Encounter (Signed)
Patient made aware.

## 2021-04-09 NOTE — Telephone Encounter (Signed)
Pt asking if she can get her anxiety medicine to be increased Also if she can also get something for her appetite

## 2021-04-12 ENCOUNTER — Telehealth: Payer: Self-pay

## 2021-04-12 NOTE — Telephone Encounter (Signed)
Patient has appointment scheduled Friday 6/17 at 3:40 pm.

## 2021-04-12 NOTE — Telephone Encounter (Signed)
Followed up with Megan Mitchell this morning regarding her message over the weekend about anxiety and nausea. Patient called her PCP Friday 04/09/21 regarding anxiety medication (Sertraline) and dose was increased. Explained that the dosage increase can cause nausea. Patient states she is calling her PCP this morning to follow up.

## 2021-04-12 NOTE — Telephone Encounter (Signed)
Pt stated that her anxiety medicine is keeping up a night and will talk to you on Friday about it.

## 2021-04-12 NOTE — Telephone Encounter (Signed)
Ms Megan Mitchell is currently on pal. She is to return to work on 04-14-21. She is requesting to adjust her FMLA for surgery from being out the day of surgery 04-22-21 to 04-14-21 as she is having difficulty with N/V and anxiety and does not feel that she can work prior to her surgery date. Told Ms Megan Mitchell that Dr. Denman Mitchell will be in the office tomorrow and Megan Mitchell will discuss her request and the office will call her regarding Dr. Serita Mitchell decision.

## 2021-04-14 NOTE — Telephone Encounter (Signed)
Ms Klute states that she will be able to use vacation time to be out of work until her surgery 04-22-21. The FMLA can begin on 04-22-21 with a return to work on 05-24-21. Information given to Melissa Cross,NP.

## 2021-04-15 ENCOUNTER — Telehealth: Payer: 59 | Admitting: Nurse Practitioner

## 2021-04-16 ENCOUNTER — Other Ambulatory Visit: Payer: Self-pay

## 2021-04-16 ENCOUNTER — Other Ambulatory Visit (HOSPITAL_COMMUNITY): Payer: Self-pay

## 2021-04-16 ENCOUNTER — Telehealth: Payer: 59 | Admitting: Nurse Practitioner

## 2021-04-16 ENCOUNTER — Telehealth (INDEPENDENT_AMBULATORY_CARE_PROVIDER_SITE_OTHER): Payer: 59 | Admitting: Nurse Practitioner

## 2021-04-16 ENCOUNTER — Telehealth: Payer: Self-pay | Admitting: *Deleted

## 2021-04-16 DIAGNOSIS — M5441 Lumbago with sciatica, right side: Secondary | ICD-10-CM | POA: Diagnosis not present

## 2021-04-16 DIAGNOSIS — S39012D Strain of muscle, fascia and tendon of lower back, subsequent encounter: Secondary | ICD-10-CM | POA: Diagnosis not present

## 2021-04-16 DIAGNOSIS — F411 Generalized anxiety disorder: Secondary | ICD-10-CM

## 2021-04-16 MED ORDER — BUSPIRONE HCL 5 MG PO TABS
5.0000 mg | ORAL_TABLET | Freq: Three times a day (TID) | ORAL | 0 refills | Status: DC
  Filled 2021-04-16: qty 90, 30d supply, fill #0

## 2021-04-16 MED ORDER — METAXALONE 800 MG PO TABS
ORAL_TABLET | ORAL | 1 refills | Status: DC
  Filled 2021-04-16: qty 60, 20d supply, fill #0

## 2021-04-16 NOTE — Telephone Encounter (Signed)
Spoke with Ms Dadisman and discussed that she may be experiencing some anxiety attacks and she is breathing shallowly and begins to hyperventilate. She gets dizzy, sweaty, and feels like her legs are numb. Discussed some techniques to help her breath deeply and slowly when she begins to feel this way. Encouraged her to try to have friends and family around and play games cards etc to distract her mind from the surgery. Ms Sykora appreciated the information.

## 2021-04-16 NOTE — Telephone Encounter (Signed)
Patient called and stated "I'm just not feeling well. I passing out last yesterday when I was in the shower. I lied on the floor for 20-30 minutes then made it to the bed. It happened again today, but before I passed out I was able to get out and lay down. I am extremely tired. I do have some nausea, back spasms and chills. I did see my PCP today with a video appt. The PCP thought It was ether my anxiety or a bug that is going around." Explained that the nurse will call her back

## 2021-04-16 NOTE — Progress Notes (Signed)
[place], [place]  [address] Phone:  [phone]   Fax:  [phone] Virtual Visit via Telephone Note  I connected with Megan Mitchell on 04/16/21 at 10:20 AM EDT by telephone and verified that I am speaking with the correct person using two identifiers.   I discussed the limitations, risks, security and privacy concerns of performing an evaluation and management service by telephone and the availability of in person appointments. I also discussed with the patient that there may be a patient responsible charge related to this service. The patient expressed understanding and agreed to proceed.  Patient home Provider Office  History of Present Illness:  Megan Mitchell  has a past medical history of Allergy, Anemia, Anxiety, Arthritis, Fibroids, Hypertension, and Ovarian cyst.    She is having increased anxiety and depression. She is not able to clear her head with that. She reports that she has a large cyst; this has been going on since November. She is getting ready to undergo surgery and a hysterectomy . She is not sure about the surgery. She was able to address concern with the surgery. She has lost weight due to not eating. She has lost 10-15 pounds in the last 3 months. She is currently out on FMLA.  She reports that she needs something for anxiety. She is wanting to have something to help her rest. She works at the [employer] so this causes her to worry even more.  She was started on sertraline a few months ago.She has requested for the Sertraline to be increased on last week. However she did not take this. She feels like this has been keeping her up  is not able to rest.  So she is not taking any medication for her depression or anxiety. She is new to this office and she denies any problems prior.  She did report some abnormal thoughts.  She has declined psychiatry in the past and declines it again today.  She reports that her was out of  town with her husband.  She had to sleep on a small bed; for sinus.  She has been having spasms going down her leg. She reports that she feel out yesterday. She just laid there for 20-30. She has been hydrating. She is having a lot of trouble with her back. She has right leg pain also.  She has tramadol however she has not been taking this.  She reports being on muscle relaxers in the past.  She would like some treatment for her back.     ROS     Observations/Objective: No exam; telephone visit  Assessment and Plan: Assessment  Primary Diagnosis & Pertinent Problem List: The primary encounter diagnosis was GAD (generalized anxiety disorder). Diagnoses of Acute midline low back pain with right-sided sciatica and Back strain, subsequent encounter were also pertinent to this visit.  Visit Diagnosis: 1. GAD (generalized anxiety disorder)  Referral to clinical social work within the office for follow-up on next week BuSpar 5 mg 3 times daily as needed Provided information about managing anxiety along with crisis hotline.  Offered prayer to patient  2. Acute midline low back pain with right-sided sciatica  Worsening Encouraged tramadol along with restart of methocarbamol 500 mg as needed  3. Back strain, subsequent encounter    Follow Up Instructions:   Follow up 4 weeks  I discussed the assessment and treatment plan with the patient. The patient was provided an opportunity  to ask questions and all were answered. The patient agreed with the plan and demonstrated an understanding of the instructions.   The patient was advised to call back or seek an in-person evaluation if the symptoms worsen or if the condition fails to improve as anticipated.  I provided 39 minutes of telephone- visit time during this encounter.   Vevelyn Francois, NP

## 2021-04-16 NOTE — Patient Instructions (Addendum)
Managing Anxiety, Adult After being diagnosed with an anxiety disorder, you may be relieved to know why you have felt or behaved a certain way. You may also feel overwhelmed about the treatment ahead and what it will mean for your life. With care and support, youcan manage this condition and recover from it. How to manage lifestyle changes Managing stress and anxiety  Stress is your body's reaction to life changes and events, both good and bad. Most stress will last just a few hours, but stress can be ongoing and can lead to more than just stress. Although stress can play a major role in anxiety, it is not the same as anxiety. Stress is usually caused by something external, such as a deadline, test, or competition. Stress normally passes after thetriggering event has ended.  Anxiety is caused by something internal, such as imagining a terrible outcome or worrying that something will go wrong that will devastate you. Anxiety often does not go away even after the triggering event is over, and it can become long-term (chronic) worry. It is important to understand the differences between stress and anxiety and to manage your stress effectively so that it does not lead to ananxious response. Talk with your health care provider or a counselor to learn more about reducing anxiety and stress. He or she may suggest tension reduction techniques, such as: Music therapy. This can include creating or listening to music that you enjoy and that inspires you. Mindfulness-based meditation. This involves being aware of your normal breaths while not trying to control your breathing. It can be done while sitting or walking. Centering prayer. This involves focusing on a word, phrase, or sacred image that means something to you and brings you peace. Deep breathing. To do this, expand your stomach and inhale slowly through your nose. Hold your breath for 3-5 seconds. Then exhale slowly, letting your stomach muscles  relax. Self-talk. This involves identifying thought patterns that lead to anxiety reactions and changing those patterns. Muscle relaxation. This involves tensing muscles and then relaxing them. Choose a tension reduction technique that suits your lifestyle and personality. These techniques take time and practice. Set aside 5-15 minutes a day to do them. Therapists can offer counseling and training in these techniques. The training to help with anxiety may be covered by some insurance plans. Other things you can do to manage stress and anxiety include: Keeping a stress/anxiety diary. This can help you learn what triggers your reaction and then learn ways to manage your response. Thinking about how you react to certain situations. You may not be able to control everything, but you can control your response. Making time for activities that help you relax and not feeling guilty about spending your time in this way. Visual imagery and yoga can help you stay calm and relax.  Medicines Medicines can help ease symptoms. Medicines for anxiety include: Anti-anxiety drugs. Antidepressants. Medicines are often used as a primary treatment for anxiety disorder. Medicines will be prescribed by a health care provider. When used together, medicines, psychotherapy, and tension reduction techniques may be the most effectivetreatment. Relationships Relationships can play a big part in helping you recover. Try to spend more time connecting with trusted friends and family members. Consider going to couples counseling, taking family education classes, or going to familytherapy. Therapy can help you and others better understand your condition. How to recognize changes in your anxiety Everyone responds differently to treatment for anxiety. Recovery from anxiety happens when symptoms decrease and stop   interfering with your daily activities at home or work. This may mean that you will start to: Have better concentration and  focus. Worry will interfere less in your daily thinking. Sleep better. Be less irritable. Have more energy. Have improved memory. It is important to recognize when your condition is getting worse. Contact your health care provider if your symptoms interfere with home or work and you feellike your condition is not improving. Follow these instructions at home: Activity Exercise. Most adults should do the following: Exercise for at least 150 minutes each week. The exercise should increase your heart rate and make you sweat (moderate-intensity exercise). Strengthening exercises at least twice a week. Get the right amount and quality of sleep. Most adults need 7-9 hours of sleep each night. Lifestyle  Eat a healthy diet that includes plenty of vegetables, fruits, whole grains, low-fat dairy products, and lean protein. Do not eat a lot of foods that are high in solid fats, added sugars, or salt. Make choices that simplify your life. Do not use any products that contain nicotine or tobacco, such as cigarettes, e-cigarettes, and chewing tobacco. If you need help quitting, ask your health care provider. Avoid caffeine, alcohol, and certain over-the-counter cold medicines. These may make you feel worse. Ask your pharmacist which medicines to avoid.  General instructions Take over-the-counter and prescription medicines only as told by your health care provider. Keep all follow-up visits as told by your health care provider. This is important. Where to find support You can get help and support from these sources: Self-help groups. Online and [employer]. A trusted spiritual leader. Couples counseling. Family education classes. Family therapy. Where to find more information You may find that joining a support group helps you deal with your anxiety. The following sources can help you locate counselors or support groups near you: [place]:  www.mentalhealthamerica.net Anxiety and Depression Association of [place] (ADAA): https://www.clark.net/ National Alliance on Mental Illness (NAMI): www.nami.org Contact a health care provider if you: Have a hard time staying focused or finishing daily tasks. Spend many hours a day feeling worried about everyday life. Become exhausted by worry. Start to have headaches, feel tense, or have nausea. Urinate more than normal. Have diarrhea. Get help right away if you have: A racing heart and shortness of breath. Thoughts of hurting yourself or others. If you ever feel like you may hurt yourself or others, or have thoughts about taking your own life, get help right away. You can go to your nearest emergency department or call: Your local emergency services (911 in the [place] A suicide crisis helpline, such as the [place] at (539) 534-6467. This is open 24 hours a day. Summary Taking steps to learn and use tension reduction techniques can help calm you and help prevent triggering an anxiety reaction. When used together, medicines, psychotherapy, and tension reduction techniques may be the most effective treatment. Family, friends, and partners can play a big part in helping you recover from an anxiety disorder. This information is not intended to replace advice given to you by your health care provider. Make sure you discuss any questions you have with your healthcare provider. Document Revised: 03/19/2019 Document Reviewed: 03/19/2019 Elsevier Patient Education  [place]  [phone]  [place]  [phone]  Crisis Line [phone]  Low Back Sprain or Strain Rehab Ask your health care provider which exercises are safe for you. Do exercises exactly as told by your health care provider and adjust  them as directed. It is normal to feel mild stretching, pulling, tightness, or discomfort as you do these exercises. Stop right away if you feel  sudden pain or your pain gets worse. Do not begin these exercises until told by your health care provider. Stretching and range-of-motion exercises These exercises warm up your muscles and joints and improve the movement and flexibility of your back. These exercises also help to relieve pain, numbness,and tingling. Lumbar rotation  Lie on your back on a firm surface and bend your knees. Straighten your arms out to your sides so each arm forms a 90-degree angle (right angle) with a side of your body. Slowly move (rotate) both of your knees to one side of your body until you feel a stretch in your lower back (lumbar). Try not to let your shoulders lift off the floor. Hold this position for __________ seconds. Tense your abdominal muscles and slowly move your knees back to the starting position. Repeat this exercise on the other side of your body. Repeat __________ times. Complete this exercise __________ times a day. Single knee to chest  Lie on your back on a firm surface with both legs straight. Bend one of your knees. Use your hands to move your knee up toward your chest until you feel a gentle stretch in your lower back and buttock. Hold your leg in this position by holding on to the front of your knee. Keep your other leg as straight as possible. Hold this position for __________ seconds. Slowly return to the starting position. Repeat with your other leg. Repeat __________ times. Complete this exercise __________ times a day. Prone extension on elbows  Lie on your abdomen on a firm surface (prone position). Prop yourself up on your elbows. Use your arms to help lift your chest up until you feel a gentle stretch in your abdomen and your lower back. This will place some of your body weight on your elbows. If this is uncomfortable, try stacking pillows under your chest. Your hips should stay down, against the surface that you are lying on. Keep your hip and back muscles relaxed. Hold  this position for __________ seconds. Slowly relax your upper body and return to the starting position. Repeat __________ times. Complete this exercise __________ times a day. Strengthening exercises These exercises build strength and endurance in your back. Endurance is theability to use your muscles for a long time, even after they get tired. Pelvic tilt This exercise strengthens the muscles that lie deep in the abdomen. Lie on your back on a firm surface. Bend your knees and keep your feet flat on the floor. Tense your abdominal muscles. Tip your pelvis up toward the ceiling and flatten your lower back into the floor. To help with this exercise, you may place a small towel under your lower back and try to push your back into the towel. Hold this position for __________ seconds. Let your muscles relax completely before you repeat this exercise. Repeat __________ times. Complete this exercise __________ times a day. Alternating arm and leg raises  Get on your hands and knees on a firm surface. If you are on a hard floor, you may want to use padding, such as an exercise mat, to cushion your knees. Line up your arms and legs. Your hands should be directly below your shoulders, and your knees should be directly below your hips. Lift your left leg behind you. At the same time, raise your right arm and straighten it in front  of you. Do not lift your leg higher than your hip. Do not lift your arm higher than your shoulder. Keep your abdominal and back muscles tight. Keep your hips facing the ground. Do not arch your back. Keep your balance carefully, and do not hold your breath. Hold this position for __________ seconds. Slowly return to the starting position. Repeat with your right leg and your left arm. Repeat __________ times. Complete this exercise __________ times a day. Abdominal set with straight leg raise  Lie on your back on a firm surface. Bend one of your knees and keep your  other leg straight. Tense your abdominal muscles and lift your straight leg up, 4-6 inches (10-15 cm) off the ground. Keep your abdominal muscles tight and hold this position for __________ seconds. Do not hold your breath. Do not arch your back. Keep it flat against the ground. Keep your abdominal muscles tense as you slowly lower your leg back to the starting position. Repeat with your other leg. Repeat __________ times. Complete this exercise __________ times a day. Single leg lower with bent knees Lie on your back on a firm surface. Tense your abdominal muscles and lift your feet off the floor, one foot at a time, so your knees and hips are bent in 90-degree angles (right angles). Your knees should be over your hips and your lower legs should be parallel to the floor. Keeping your abdominal muscles tense and your knee bent, slowly lower one of your legs so your toe touches the ground. Lift your leg back up to return to the starting position. Do not hold your breath. Do not let your back arch. Keep your back flat against the ground. Repeat with your other leg. Repeat __________ times. Complete this exercise __________ times a day. Posture and body mechanics Good posture and healthy body mechanics can help to relieve stress in your body's tissues and joints. Body mechanics refers to the movements and positions of your body while you do your daily activities. Posture is part of body mechanics. Good posture means: Your spine is in its natural S-curve position (neutral). Your shoulders are pulled back slightly. Your head is not tipped forward. Follow these guidelines to improve your posture and body mechanics in youreveryday activities. Standing  When standing, keep your spine neutral and your feet about hip width apart. Keep a slight bend in your knees. Your ears, shoulders, and hips should line up. When you do a task in which you stand in one place for a long time, place one foot up on a  stable object that is 2-4 inches (5-10 cm) high, such as a footstool. This helps keep your spine neutral.  Sitting  When sitting, keep your spine neutral and keep your feet flat on the floor. Use a footrest, if necessary, and keep your thighs parallel to the floor. Avoid rounding your shoulders, and avoid tilting your head forward. When working at a desk or a computer, keep your desk at a height where your hands are slightly lower than your elbows. Slide your chair under your desk so you are close enough to maintain good posture. When working at a computer, place your monitor at a height where you are looking straight ahead and you do not have to tilt your head forward or downward to look at the screen.  Resting When lying down and resting, avoid positions that are most painful for you. If you have pain with activities such as sitting, bending, stooping, or squatting, lie  in a position in which your body does not bend very much. For example, avoid curling up on your side with your arms and knees near your chest (fetal position). If you have pain with activities such as standing for a long time or reaching with your arms, lie with your spine in a neutral position and bend your knees slightly. Try the following positions: Lying on your side with a pillow between your knees. Lying on your back with a pillow under your knees. Lifting  When lifting objects, keep your feet at least shoulder width apart and tighten your abdominal muscles. Bend your knees and hips and keep your spine neutral. It is important to lift using the strength of your legs, not your back. Do not lock your knees straight out. Always ask for help to lift heavy or awkward objects.  This information is not intended to replace advice given to you by your health care provider. Make sure you discuss any questions you have with your healthcare provider. Document Revised: 02/08/2019 Document Reviewed: 11/08/2018 Elsevier Patient  Education  [place].

## 2021-04-19 ENCOUNTER — Other Ambulatory Visit: Payer: Self-pay

## 2021-04-19 ENCOUNTER — Telehealth: Payer: 59 | Admitting: Nurse Practitioner

## 2021-04-19 ENCOUNTER — Ambulatory Visit (INDEPENDENT_AMBULATORY_CARE_PROVIDER_SITE_OTHER): Payer: 59 | Admitting: Clinical

## 2021-04-19 ENCOUNTER — Telehealth: Payer: Self-pay

## 2021-04-19 ENCOUNTER — Other Ambulatory Visit: Payer: Self-pay | Admitting: Nurse Practitioner

## 2021-04-19 DIAGNOSIS — F411 Generalized anxiety disorder: Secondary | ICD-10-CM | POA: Diagnosis not present

## 2021-04-19 NOTE — Telephone Encounter (Signed)
Ms Belcourt stated that she began vomiting on Saturday with shaking chills, She vomited x3 on Sunday  and her temperature was 101. She is only able to take in sips of diet Dr. Malachi Bonds. She has taken in only ~8 oz  of fluid  in today, Denies urinary symptoms. No cough or chest congestion. No abdominal pain. Her temperature today was 99.2 to 101. Told her that Dr. Denman George wanted her to do a home covid test. Ms Langenberg called back and sent that the home Covid was negative. Dr. Denman George said that she could take tylenol for her temperature. She needs to come to her presurgical appointment tomorrow to get labs.  Dr. Denman George will review and determine if she can proceed with her surgery on 04-22-21.  If her symptoms worsen, she can go to the ED to be evaluated. Pt verbalized understanding.

## 2021-04-19 NOTE — BH Specialist Note (Addendum)
Integrated Behavioral Health via Telemedicine Visit  04/19/2021 Megan Mitchell [id number]  Number of Integrated Behavioral Health visits: 1/6 Session Start time: 1:45  Session End time: 2:45 Total time: 60  Referring Provider: Dionisio David, NP Patient/Family location: [address], [address] [facility] Provider location: Patient [place] All persons participating in visit: patient, CSW Types of Service: Individual psychotherapy and Telephone visit  I connected with Megan Mitchell via Telephone and verified that I am speaking with the correct person using two identifiers. Discussed confidentiality: Yes   I discussed the limitations of telemedicine and the availability of in person appointments.  Discussed there is a possibility of technology failure and discussed alternative modes of communication if that failure occurs.  I discussed that engaging in this telemedicine visit, they consent to the provision of behavioral healthcare and the services will be billed under their insurance.  Patient and/or legal guardian expressed understanding and consented to Telemedicine visit: Yes   Presenting Concerns: Patient and/or family reports the following symptoms/concerns: anxiety, depression Duration of problem: several months; Severity of problem: severe  Patient and/or Family's Strengths/Protective Factors: Social connections, Concrete supports in place (healthy food, safe environments, etc.), Sense of purpose, and religious beliefs   Goals Addressed: Patient will:  Reduce symptoms of: anxiety and depression   Increase knowledge and/or ability of: coping skills and self-management skills   Demonstrate ability to: Increase healthy adjustment to current life circumstances  Progress towards Goals: Ongoing  Interventions: Interventions utilized:  CBT Cognitive Behavioral Therapy and Supportive Counseling Standardized Assessments completed: Not Needed  Supportive counseling and  assessment regarding patient's mood and anxiety. Patient is having a medical procedure later this week and worries about the possibility of having cancer. CBT today to process some of the unhelpful thoughts patient has that exacerbate depression and anxiety.   Patient and/or Family Response: Patient engaged in session. Patient reports another period of depression and anxiety during and following divorce from her first husband. She exhibits insight into unhelpful thoughts exacerbating depression.  Assessment: Patient currently experiencing depression and anxiety exacerbated by health concerns. She is worried about the outcome of a surgery and biopsy scheduled later this week. She denies thoughts of suicide.   Patient may benefit from continued supportive counseling with CBT to address and challenge unhelpful thought processes and to learn new coping skills for dealing with anxiety.  Plan: Follow up with behavioral health clinician on: 04/26/21 Referral(s): [place] (In Clinic)  I discussed the assessment and treatment plan with the patient and/or parent/guardian. They were provided an opportunity to ask questions and all were answered. They agreed with the plan and demonstrated an understanding of the instructions.   They were advised to call back or seek an in-person evaluation if the symptoms worsen or if the condition fails to improve as anticipated.  Megan Mitchell, [facility] [phone]

## 2021-04-19 NOTE — Patient Instructions (Addendum)
DUE TO COVID-19 ONLY ONE VISITOR IS ALLOWED TO COME WITH YOU AND STAY IN THE WAITING ROOM ONLY DURING PRE OP AND PROCEDURE DAY OF SURGERY. THE 1 VISITOR  MAY VISIT WITH YOU AFTER SURGERY IN YOUR PRIVATE ROOM DURING VISITING HOURS ONLY!                Megan Mitchell     Your procedure is scheduled on: 04/22/21   Report to [facility] Main  Entrance   Report to admitting at   8:30 AM     Call this number if you have problems the morning of surgery [phone]   Eat a light diet the day before surgery.   Examples including soups, broths, toast, yogurt, mashed potatoes.  Things to avoid include carbonated beverages (fizzy beverages), raw fruits and raw vegetables, or beans.   If your bowels are filled with gas, your surgeon will have difficulty visualizing your pelvic organs which increases your surgical risks.    Remember: Do not eat food after Midnight.     CLEAR LIQUID DIET   Foods Allowed                                                                     Foods Excluded  Coffee and tea, regular and decaf                             liquids that you cannot  Plain Jell-O any favor except red or purple                                           see through such as: Fruit ices (not with fruit pulp)                                     milk, soups, orange juice  Iced Popsicles                                    All solid food Carbonated beverages, regular and diet                                    Cranberry, grape and apple juices Sports drinks like Gatorade Lightly seasoned clear broth or consume(fat free) Sugar, honey syrup      You may have clear liquid until 7:30 am    BRUSH YOUR TEETH MORNING OF SURGERY AND RINSE YOUR MOUTH OUT, NO CHEWING GUM CANDY OR MINTS.     Take these medicines the morning of surgery with A SIP OF WATER: Buspirone, Amlodipine                                You may not have any metal on your body including hair pins and  piercings  Do not wear jewelry, make-up, lotions, powders or perfumes, deodorant             Do not wear nail polish on your fingernails.  Do not shave  48 hours prior to surgery.             .   Do not bring valuables to the hospital. [place].  Contacts, dentures or bridgework may not be worn into surgery.       Patients discharged the day of surgery will not be allowed to drive home.   IF YOU ARE HAVING SURGERY AND GOING HOME THE SAME DAY, YOU MUST HAVE AN ADULT TO DRIVE YOU HOME AND BE WITH YOU FOR 24 HOURS. YOU MAY GO HOME BY TAXI OR UBER OR ORTHERWISE, BUT AN ADULT MUST ACCOMPANY YOU HOME AND STAY WITH YOU FOR 24 HOURS.  Name and phone number of your driver:  Special Instructions: N/A              Please read over the following fact sheets you were given: _____________________________________________________________________             [facility] - Preparing for Surgery Before surgery, you can play an important role.  Because skin is not sterile, your skin needs to be as free of germs as possible.  You can reduce the number of germs on your skin by washing with CHG (chlorahexidine gluconate) soap before surgery.  CHG is an antiseptic cleaner which kills germs and bonds with the skin to continue killing germs even after washing. Please DO NOT use if you have an allergy to CHG or antibacterial soaps.  If your skin becomes reddened/irritated stop using the CHG and inform your nurse when you arrive at Short Stay. Do not shave (including legs and underarms) for at least 48 hours prior to the first CHG shower.   Please follow these instructions carefully:  1.  Shower with CHG Soap the night before surgery and the  morning of Surgery.  2.  If you choose to wash your hair, wash your hair first as usual with your  normal  shampoo.  3.  After you shampoo, rinse your hair and body thoroughly to remove the  shampoo.                                         4.  Use CHG as you would any other liquid soap.  You can apply chg directly  to the skin and wash                       Gently with a scrungie or clean washcloth.  5.  Apply the CHG Soap to your body ONLY FROM THE NECK DOWN.   Do not use on face/ open                           Wound or open sores. Avoid contact with eyes, ears mouth and genitals (private parts).                       Wash face,  Genitals (private parts) with your normal soap.  6.  Wash thoroughly, paying special attention to the area where your surgery  will be performed.  7.  Thoroughly rinse your body with warm water from the neck down.  8.  DO NOT shower/wash with your normal soap after using and rinsing off  the CHG Soap.             9.  Pat yourself dry with a clean towel.            10.  Wear clean pajamas.            11.  Place clean sheets on your bed the night of your first shower and do not  sleep with pets. Day of Surgery : Do not apply any lotions/deodorants the morning of surgery.  Please wear clean clothes to the hospital/surgery center.  FAILURE TO FOLLOW THESE INSTRUCTIONS MAY RESULT IN THE CANCELLATION OF YOUR SURGERY PATIENT SIGNATURE_________________________________  NURSE SIGNATURE__________________________________  ________________________________________________________________________  WHAT IS A BLOOD TRANSFUSION? Blood Transfusion Information  A transfusion is the replacement of blood or some of its parts. Blood is made up of multiple cells which provide different functions. Red blood cells carry oxygen and are used for blood loss replacement. White blood cells fight against infection. Platelets control bleeding. Plasma helps clot blood. Other blood products are available for specialized needs, such as hemophilia or other clotting disorders. BEFORE THE TRANSFUSION  Who gives blood for transfusions?  Healthy volunteers who are fully evaluated to make sure their blood is safe.  This is blood bank blood. Transfusion therapy is the safest it has ever been in the practice of medicine. Before blood is taken from a donor, a complete history is taken to make sure that person has no history of diseases nor engages in risky social behavior (examples are intravenous drug use or sexual activity with multiple partners). The donor's travel history is screened to minimize risk of transmitting infections, such as malaria. The donated blood is tested for signs of infectious diseases, such as HIV and hepatitis. The blood is then tested to be sure it is compatible with you in order to minimize the chance of a transfusion reaction. If you or a relative donates blood, this is often done in anticipation of surgery and is not appropriate for emergency situations. It takes many days to process the donated blood. RISKS AND COMPLICATIONS Although transfusion therapy is very safe and saves many lives, the main dangers of transfusion include:  Getting an infectious disease. Developing a transfusion reaction. This is an allergic reaction to something in the blood you were given. Every precaution is taken to prevent this. The decision to have a blood transfusion has been considered carefully by your caregiver before blood is given. Blood is not given unless the benefits outweigh the risks. AFTER THE TRANSFUSION Right after receiving a blood transfusion, you will usually feel much better and more energetic. This is especially true if your red blood cells have gotten low (anemic). The transfusion raises the level of the red blood cells which carry oxygen, and this usually causes an energy increase. The nurse administering the transfusion will monitor you carefully for complications. HOME CARE INSTRUCTIONS  No special instructions are needed after a transfusion. You may find your energy is better. Speak with your caregiver about any limitations on activity for underlying diseases you may have. SEEK MEDICAL  CARE IF:  Your condition is not improving after your transfusion. You develop redness or irritation at the intravenous (IV) site. [place]  CARE IF:  Any of the following symptoms occur over the next 12 hours: Shaking chills. You have a temperature by mouth above 102 F (38.9 C), not controlled by medicine. Chest, back, or muscle pain. People around you feel you are not acting correctly or are confused. Shortness of breath or difficulty breathing. Dizziness and fainting. You get a rash or develop hives. You have a decrease in urine output. Your urine turns a dark color or changes to pink, red, or brown. Any of the following symptoms occur over the next 10 days: You have a temperature by mouth above 102 F (38.9 C), not controlled by medicine. Shortness of breath. Weakness after normal activity. The white part of the eye turns yellow (jaundice). You have a decrease in the amount of urine or are urinating less often. Your urine turns a dark color or changes to pink, red, or brown. Document Released: 10/14/2000 Document Revised: 01/09/2012 Document Reviewed: 06/02/2008 [facility] Patient Information 2014 [place], [place].  _______________________________________________________________________

## 2021-04-20 ENCOUNTER — Other Ambulatory Visit: Payer: Self-pay | Admitting: Gynecologic Oncology

## 2021-04-20 ENCOUNTER — Telehealth: Payer: Self-pay

## 2021-04-20 ENCOUNTER — Other Ambulatory Visit (HOSPITAL_BASED_OUTPATIENT_CLINIC_OR_DEPARTMENT_OTHER): Payer: 59

## 2021-04-20 ENCOUNTER — Encounter (HOSPITAL_COMMUNITY)
Admission: RE | Admit: 2021-04-20 | Discharge: 2021-04-20 | Disposition: A | Discharge status: Home / Self Care | Payer: 59 | Source: Ambulatory Visit | Attending: Family Medicine | Admitting: Family Medicine

## 2021-04-20 DIAGNOSIS — R1084 Generalized abdominal pain: Secondary | ICD-10-CM

## 2021-04-20 DIAGNOSIS — R509 Fever, unspecified: Secondary | ICD-10-CM

## 2021-04-20 DIAGNOSIS — R112 Nausea with vomiting, unspecified: Secondary | ICD-10-CM

## 2021-04-20 DIAGNOSIS — R1900 Intra-abdominal and pelvic swelling, mass and lump, unspecified site: Secondary | ICD-10-CM

## 2021-04-20 DIAGNOSIS — R971 Elevated cancer antigen 125 [CA 125]: Secondary | ICD-10-CM

## 2021-04-20 NOTE — Telephone Encounter (Signed)
Received phone call from [facility] stating she still isn't feeling better. Last night she was able to eat a few saltines but has been unable to eat or drink anything today. She is home alone and has been vomiting since 5am and has now developed diarrhea and dizziness. She has not taken her temp lately but does not feel like she has a fever. Patient contacted her PCP last night, per patient she said it might be a virus and to drink fluids.  Wendelin was not able to make it to her pre-op appointment this morning.  Dr. Denman George aware and has ordered labs and CT with contrast.

## 2021-04-20 NOTE — Telephone Encounter (Addendum)
Patient called and stated "Megan Mitchell told me to call her back once I heard from my primary doctor. The nurse is going to send [facility] [place] [employer] a message and she will probably call me tomorrow when she returns. They didn't order any medicines or offer IV fluids. I do have some antinausea dissolving med's to take. I tried one last night and may try one again soon. They did tell me to go a drive thru to be retested for COVID, but I'm not doing that. (Tested negative yesterday, home test) They said I can go to an urgent care or the ER, I'm not doing that, I'm not going there to just sit and sit. I have being drinking water and Pepsi; trying to eat applesauce now." Per Melissa PP explained to try the [employer]

## 2021-04-20 NOTE — Progress Notes (Signed)
See RN note. Ct ordered per Dr. Denman George due to persistent nausea, emesis, fever for the past 3 days. Large pelvic mass on previous imaging with elevated CA 125.

## 2021-04-20 NOTE — Telephone Encounter (Signed)
Megan Mitchell would like to have her surgery on thursday.  Told her that she needs to have some of her pre-op labs tomorrow to see if she is physiologically up to the surgery.   There are no pre-op appointments available.  She would need to come to the [facility] to have some labs done tomorrow. Megan Mitchell said that it would depend on how she felt in the am.  Told her that she will be called in the am to see how she is feeling.  If she cannot come in for labs tomorrow, the surgery will need to be postponed.

## 2021-04-20 NOTE — Telephone Encounter (Signed)
Pt declining to get lab work and CT A/P today as she does not feel that her ovarian cyst is the problem.  She feels that she either has a viral infection or possibly had some food poisoning from a sandwich her husband got for her on Saturday. She is waiting for her PCP to call her back with recommendations. She does not want to go to the ED as she will wait a while to be seen and she is concerned about contracting covid.Cancelled CT scan scheduled at 1430 at [facility]. Ms Free will call this office to inform us as to what her PCP recommends. Reviewed above with Joylene John, NP.

## 2021-04-20 NOTE — Telephone Encounter (Signed)
Pt wants to speak w/ a nurse about her symptoms. Can you please call her

## 2021-04-21 ENCOUNTER — Telehealth: Payer: Self-pay

## 2021-04-21 ENCOUNTER — Telehealth: Payer: Self-pay | Admitting: Gynecologic Oncology

## 2021-04-21 ENCOUNTER — Encounter (HOSPITAL_COMMUNITY): Payer: Self-pay | Admitting: Anesthesiology

## 2021-04-21 NOTE — Telephone Encounter (Signed)
Called patient to check in. Patient states she is still feeling poorly. She is weak, fatigued, with intermittent dizziness. A nurse friend is at her house checking her BP right now. I informed the patient that Dr. Denman George still recommends proceeding with surgery tomorrow and feels that some of her symptoms may be related to the pelvic/abdominal cyst. The patient states she feels she needs to recover from whatever is going on now (poss stomach bug, food poisoning) and would not want to proceed with surgery tomorrow. Told the patient her feelings were understandable and our office would be calling her to reschedule her surgery. Advised her to call for questions or concerns.

## 2021-04-21 NOTE — Progress Notes (Signed)
Spoke to patient and she stated that surgery is being rescheduled due to continued nausea,vomiting and diarrhea.

## 2021-04-21 NOTE — Telephone Encounter (Signed)
LM for Ms Angelo to call back to discuss decision about her lab work and surgery for tomorrow.

## 2021-04-21 NOTE — Telephone Encounter (Signed)
Megan Mitchell states that she is feeling weak.  She vomited x2 this am. Has taken in 3 oz of fluid.  No diarrhea since yesterday. Her friend is coming over some time to take her BP. She does not feel right. Told her that she is dehydrated as she has not been able to replenish the fluids she has lost. She has also has been taking the triamterene-hydrochlorothiazide for her BP.   Told her that this has a diuretic component that can be adding to dehydration  and lowering her BP. She has not taken temp but feels as though is does not have a temperature. Told her that she needs to come in to the office for a cbc,diff and b-met today to see if she can proceed with surgery tomorrow. She is going to call her husband at work and discuss situation with him.  Will follow up with her ~11:30 am to see how she would like to proceed.

## 2021-04-21 NOTE — Telephone Encounter (Signed)
Megan Mitchell does not have a ride to come to the [facility] enter for lab. Offered our transportation service. Megan Mitchell  states that she has had more diarrhea today.   She has a friend that is coming over some time after 1 pm today. Told her that she needs to call the office for a lab appointment for no later then 3 pm to allow time for results and review of lab work to determine if surgery is feasible otherwise the surgery will be cancelled. Megan Mitchell verbalized understanding.

## 2021-04-21 NOTE — Progress Notes (Signed)
Phoned patient to complete medical history for surgery scheduled for 04/22/21.  Patient stated that she was still sick with dizziness and vomiting.  She will notify Dr. Serita Grit office.  I advised patient that I would call her back this afternoon.

## 2021-04-21 NOTE — Telephone Encounter (Signed)
Pt ask for a phone call cause she can't do her surgery and she's having diarrhea   Michela Pitcher that she would rather speak with you

## 2021-04-22 ENCOUNTER — Ambulatory Visit (HOSPITAL_COMMUNITY): Admission: RE | Admit: 2021-04-22 | Payer: 59 | Source: Home / Self Care | Admitting: Gynecologic Oncology

## 2021-04-22 ENCOUNTER — Encounter (HOSPITAL_COMMUNITY): Admission: RE | Payer: Self-pay | Source: Home / Self Care

## 2021-04-22 SURGERY — HYSTERECTOMY, TOTAL, ROBOT-ASSISTED, LAPAROSCOPIC, WITH BILATERAL SALPINGO-OOPHORECTOMY
Anesthesia: General | Laterality: Bilateral

## 2021-04-23 ENCOUNTER — Telehealth: Payer: Self-pay

## 2021-04-23 NOTE — Telephone Encounter (Signed)
Spoke with Kariann this morning, she states she is feeling a little better. She spoke with her PCP who thinks [place] has a virus. Her friend is a Marine scientist and continues to check in on her and monitor her vital signs, her BP has been a little low but she did not know the number. Infinity states she is still having some dizziness particularly when she stands up quickly. She is keeping fluids down and has been tolerating soups. She has not advanced her diet yet as she is still slightly nauseous. Encouraged her to drink plenty of fluids and try to advance her diet with some light foods. She had some chills yesterday but could not find her thermometer to take her temperature. Her nurse friend will be over to check on her later today.  Discussed scheduling her surgery to Tuesday 6/28, patients [birth year] is [birth year] 27[birth year] and would prefer something after that. The next available date is July 12th. Patient is in agreement for July 12th. We will work on getting her scheduled for surgery.  Patient will call with any questions or concerns.

## 2021-04-26 ENCOUNTER — Encounter: Payer: 59 | Admitting: Clinical

## 2021-04-26 ENCOUNTER — Telehealth: Payer: Self-pay

## 2021-04-26 ENCOUNTER — Other Ambulatory Visit (HOSPITAL_COMMUNITY): Payer: Self-pay

## 2021-04-26 MED FILL — Losartan Potassium Tab 50 MG: ORAL | 90 days supply | Qty: 90 | Fill #0 | Status: AC

## 2021-04-26 MED FILL — Amlodipine Besylate Tab 10 MG (Base Equivalent): ORAL | 90 days supply | Qty: 90 | Fill #1 | Status: AC

## 2021-04-26 NOTE — Telephone Encounter (Signed)
Spoke with Megan Mitchell this morning regarding her call over the weekend with bloating. She states she felt pressure in her abdomen, it was hard to bend over and felt extremely bloated. She states she just felt miserable yesterday but it could've been mental. She denies nausea and vomiting and is able to eat and drink ok. She had 2 BM yesterday. She has started drinking prune juice and protein shakes as well as eating fresh vegetables.Explained that her diet yesterday may have contributed to the bloating. Pt. States she is feeling better this morning. She plans on getting out of the house and moving more today.  Instructed to call if she is feeling unwell, the feeling of pressure increases or if she starts to develop pain. Patient verbalizes understanding and will call if she has any questions or concerns.

## 2021-04-27 ENCOUNTER — Ambulatory Visit (INDEPENDENT_AMBULATORY_CARE_PROVIDER_SITE_OTHER): Payer: 59 | Admitting: Clinical

## 2021-04-27 ENCOUNTER — Other Ambulatory Visit: Payer: Self-pay

## 2021-04-27 DIAGNOSIS — F411 Generalized anxiety disorder: Secondary | ICD-10-CM

## 2021-04-27 NOTE — BH Specialist Note (Signed)
Integrated Behavioral Health via Telemedicine Visit  04/27/2021 Megan Mitchell [id number]  Number of Integrated Behavioral Health visits: 2/6 Session Start time: 1:30  Session End time: 2:15 Total time: 45   Referring Provider: Dionisio David, NP Patient/Family location: [address], [place] [facility] Provider location: Patient [place] All persons participating in visit: patient, CSW Types of Service: Individual psychotherapy and Telephone visit  I connected with [name] via Telephone and verified that I am speaking with the correct person using two identifiers. Discussed confidentiality: Yes   I discussed the limitations of telemedicine and the availability of in person appointments.  Discussed there is a possibility of technology failure and discussed alternative modes of communication if that failure occurs.  I discussed that engaging in this telemedicine visit, they consent to the provision of behavioral healthcare and the services will be billed under their insurance.  Patient and/or legal guardian expressed understanding and consented to Telemedicine visit: Yes   Presenting Concerns: Patient and/or family reports the following symptoms/concerns: anxiety, depression Duration of problem: several months; Severity of problem: severe  Patient and/or Family's Strengths/Protective Factors: Social connections, Concrete supports in place (healthy food, safe environments, etc.), Sense of purpose, and religious beliefs   Goals Addressed: Patient will:  Reduce symptoms of: anxiety and depression   Increase knowledge and/or ability of: coping skills and self-management skills   Demonstrate ability to: Increase healthy adjustment to current life circumstances  Progress towards Goals: Ongoing  Interventions: Interventions utilized:  Mindfulness or Relaxation Training and Supportive Counseling Standardized Assessments completed: Not Needed  Supportive counseling today  regarding patient's anxiety about her medical condition and upcoming surgery. Patient exhibits insight into physical symptoms being related to that anxiety. Discussion about how physical symptoms are related to anxiety, and coping strategies for reducing some physical symptoms. Mindful breathing practice today for nervous system regulation. Patient participated in breathing exercise.   Patient and/or Family Response: Patient engaged in session. She exhibits insight into unhelpful thoughts exacerbating anxiety.  Assessment: Patient currently experiencing depression and anxiety exacerbated by health concerns. She is worried about the outcome of a surgery and biopsy now scheduled for July.  Patient may benefit from continued supportive counseling with CBT to address and challenge unhelpful thought processes and to learn new coping skills for dealing with anxiety.  Plan: Follow up with behavioral health clinician on: 7/722 Referral(s): [place] (In Clinic)  I discussed the assessment and treatment plan with the patient and/or parent/guardian. They were provided an opportunity to ask questions and all were answered. They agreed with the plan and demonstrated an understanding of the instructions.   They were advised to call back or seek an in-person evaluation if the symptoms worsen or if the condition fails to improve as anticipated.  [name], [facility] [phone]

## 2021-04-28 ENCOUNTER — Other Ambulatory Visit (HOSPITAL_COMMUNITY): Payer: Self-pay

## 2021-04-29 ENCOUNTER — Other Ambulatory Visit (HOSPITAL_COMMUNITY): Payer: Self-pay

## 2021-04-30 NOTE — Patient Instructions (Addendum)
DUE TO COVID-19 ONLY ONE VISITOR IS ALLOWED TO COME WITH YOU AND STAY IN THE WAITING ROOM ONLY DURING PRE OP AND PROCEDURE DAY OF SURGERY.   TWO VISITOR  MAY VISIT WITH YOU AFTER SURGERY IN YOUR PRIVATE ROOM DURING VISITING HOURS ONLY!  YOU NEED TO HAVE A COVID 19 TEST On__not needed_____ @_______ , THIS TEST MUST BE DONE BEFORE SURGERY,    COVID TESTING SITE [address] [place] [address],   IT IS ON THE RIGHT GOING OUT [address] APPROXIMATELY  2 MINUTES PAST [employer] ON THE RIGHT. ONCE YOUR COVID TEST IS COMPLETED,  PLEASE Wear a mask when in public           Your procedure is scheduled on: 05-11-21   Report to [facility] Main  Entrance   Report to admitting at     1130  AM     Call this number if you have problems the morning of surgery [phone]    Remember: Eat a light diet the day before surgery.  Examples including soups, broths, toast, yogurt, mashed potatoes.  Things to avoid include carbonated beverages (fizzy beverages), raw fruits and raw vegetables, or beans.   If your bowels are filled with gas, your surgeon will have difficulty visualizing your pelvic organs which increases your surgical risks. Eat a light diet the day before surgery.  Examples including soups, broths, toast, yogurt, mashed potatoes.  Things to avoid include carbonated beverages (fizzy beverages), raw fruits and raw vegetables, or beans.   NOTHING BY MOUTH AFTER MIDNIGHT EXCEPT CLEAR LIQUIDS UNTIL       1030 AM THEN NOTHING BY MOUTH  If your bowels are filled with gas, your surgeon will have difficulty visualizing your pelvic organs which increases your surgical risks.      CLEAR LIQUID DIET   Foods Allowed                                                                     Foods Excluded WATER BLACK Coffee and tea, regular and decaf                             liquids that you cannot  Plain Jell-O any favor except red or purple                                            see through such as: Fruit ices (not with fruit pulp)                                     milk, soups, orange juice  Iced Popsicles                                    All solid food  Cranberry, grape and apple juices Sports drinks like Gatorade Lightly seasoned clear broth or consume(fat free) Sugar, honey syrup _____________________________________________________________________     BRUSH YOUR TEETH MORNING OF SURGERY AND RINSE YOUR MOUTH OUT, NO CHEWING GUM CANDY OR MINTS.     Take these medicines the morning of surgery with A SIP OF WATER: amlodipine, zoloft                                 You may not have any metal on your body including hair pins and              piercings  Do not wear jewelry, make-up, lotions, powders or perfumes, deodorant             Do not wear nail polish on your fingernails or toenails .  Do not shave  48 hours prior to surgery.              Do not bring valuables to the hospital. [place].  Contacts, dentures or bridgework may not be worn into surgery.       Patients discharged the day of surgery will not be allowed to drive home. IF YOU ARE HAVING SURGERY AND GOING HOME THE SAME DAY, YOU MUST HAVE AN ADULT TO DRIVE YOU HOME AND BE WITH YOU FOR 24 HOURS. YOU MAY GO HOME BY TAXI OR UBER OR ORTHERWISE, BUT AN ADULT MUST ACCOMPANY YOU HOME AND STAY WITH YOU FOR 24 HOURS.  Name and phone number of your driver:  Special Instructions: N/A              Please read over the following fact sheets you were given: _____________________________________________________________________             [facility] - Preparing for Surgery Before surgery, you can play an important role.  Because skin is not sterile, your skin needs to be as free of germs as possible.  You can reduce the number of germs on your skin by washing with CHG (chlorahexidine gluconate) soap before  surgery.  CHG is an antiseptic cleaner which kills germs and bonds with the skin to continue killing germs even after washing. Please DO NOT use if you have an allergy to CHG or antibacterial soaps.  If your skin becomes reddened/irritated stop using the CHG and inform your nurse when you arrive at Short Stay. Do not shave (including legs and underarms) for at least 48 hours prior to the first CHG shower.  You may shave your face/neck. Please follow these instructions carefully:  1.  Shower with CHG Soap the night before surgery and the  morning of Surgery.  2.  If you choose to wash your hair, wash your hair first as usual with your  normal  shampoo.  3.  After you shampoo, rinse your hair and body thoroughly to remove the  shampoo.                           4.  Use CHG as you would any other liquid soap.  You can apply chg directly  to the skin and wash                       Gently with a scrungie or  clean washcloth.  5.  Apply the CHG Soap to your body ONLY FROM THE NECK DOWN.   Do not use on face/ open                           Wound or open sores. Avoid contact with eyes, ears mouth and genitals (private parts).                       Wash face,  Genitals (private parts) with your normal soap.             6.  Wash thoroughly, paying special attention to the area where your surgery  will be performed.  7.  Thoroughly rinse your body with warm water from the neck down.  8.  DO NOT shower/wash with your normal soap after using and rinsing off  the CHG Soap.                9.  Pat yourself dry with a clean towel.            10.  Wear clean pajamas.            11.  Place clean sheets on your bed the night of your first shower and do not  sleep with pets. Day of Surgery : Do not apply any lotions/deodorants the morning of surgery.  Please wear clean clothes to the hospital/surgery center.  FAILURE TO FOLLOW THESE INSTRUCTIONS MAY RESULT IN THE CANCELLATION OF YOUR SURGERY PATIENT  SIGNATURE_________________________________  NURSE SIGNATURE__________________________________  ________________________________________________________________________

## 2021-04-30 NOTE — Progress Notes (Addendum)
PCP - Kathe Becton, FNP Cardiologist -   PPM/ICD -  Device Orders -  Rep Notified -   Chest x-ray -  EKG - 05-04-21 Stress Test -  ECHO -  Cardiac Cath -   Sleep Study -  CPAP -   Fasting Blood Sugar -  Checks Blood Sugar _____ times a day  Blood Thinner Instructions: Aspirin Instructions:  ERAS Protcol - PRE-SURGERY Ensure or G2-   COVID TEST- ambulatory Activity---Able to walk a flight of stairs without SOB  Anesthesia review: HTN, hgb 9.7, urine culture routed to Joylene John, NP  Patient denies shortness of breath, fever, cough and chest pain at PAT appointment   All instructions explained to the patient, with a verbal understanding of the material. Patient agrees to go over the instructions while at home for a better understanding. Patient also instructed to self quarantine after being tested for COVID-19. The opportunity to ask questions was provided.

## 2021-05-04 ENCOUNTER — Encounter (HOSPITAL_COMMUNITY): Payer: Self-pay

## 2021-05-04 ENCOUNTER — Other Ambulatory Visit: Payer: Self-pay

## 2021-05-04 ENCOUNTER — Telehealth: Payer: Self-pay

## 2021-05-04 ENCOUNTER — Other Ambulatory Visit (HOSPITAL_COMMUNITY): Payer: Self-pay

## 2021-05-04 ENCOUNTER — Encounter (HOSPITAL_COMMUNITY)
Admission: RE | Admit: 2021-05-04 | Discharge: 2021-05-04 | Disposition: A | Discharge status: Home / Self Care | Payer: 59 | Source: Ambulatory Visit | Attending: Gynecologic Oncology | Admitting: Gynecologic Oncology

## 2021-05-04 DIAGNOSIS — Z01818 Encounter for other preprocedural examination: Secondary | ICD-10-CM | POA: Insufficient documentation

## 2021-05-04 LAB — COMPREHENSIVE METABOLIC PANEL
ALT: 9 U/L (ref 0–44)
AST: 12 U/L — ABNORMAL LOW (ref 15–41)
Albumin: 3.6 g/dL (ref 3.5–5.0)
Alkaline Phosphatase: 59 U/L (ref 38–126)
Anion gap: 11 (ref 5–15)
BUN: 19 mg/dL (ref 6–20)
CO2: 26 mmol/L (ref 22–32)
Calcium: 9.6 mg/dL (ref 8.9–10.3)
Chloride: 102 mmol/L (ref 98–111)
Creatinine, Ser: 1.06 mg/dL — ABNORMAL HIGH (ref 0.44–1.00)
GFR, Estimated: >60 mL/min (ref >60)
Glucose, Bld: 99 mg/dL (ref 70–99)
Potassium: 3.1 mmol/L — ABNORMAL LOW (ref 3.5–5.1)
Sodium: 139 mmol/L (ref 135–145)
Total Bilirubin: 0.7 mg/dL (ref 0.3–1.2)
Total Protein: 8.5 g/dL — ABNORMAL HIGH (ref 6.5–8.1)

## 2021-05-04 LAB — CBC
HCT: 32.5 % — ABNORMAL LOW (ref 36.0–46.0)
Hemoglobin: 9.7 g/dL — ABNORMAL LOW (ref 12.0–15.0)
MCH: 23.5 pg — ABNORMAL LOW (ref 26.0–34.0)
MCHC: 29.8 g/dL — ABNORMAL LOW (ref 30.0–36.0)
MCV: 78.7 fL — ABNORMAL LOW (ref 80.0–100.0)
Platelets: 701 10*3/uL — ABNORMAL HIGH (ref 150–400)
RBC: 4.13 MIL/uL (ref 3.87–5.11)
RDW: 15.6 % — ABNORMAL HIGH (ref 11.5–15.5)
WBC: 9.2 10*3/uL (ref 4.0–10.5)
nRBC: 0 % (ref 0.0–0.2)

## 2021-05-04 LAB — URINALYSIS, ROUTINE W REFLEX MICROSCOPIC
Bilirubin Urine: NEGATIVE
Glucose, UA: NEGATIVE mg/dL
Hgb urine dipstick: NEGATIVE
Ketones, ur: NEGATIVE mg/dL
Leukocytes,Ua: NEGATIVE
Nitrite: NEGATIVE
Protein, ur: 100 mg/dL — AB
Specific Gravity, Urine: 1.02 (ref 1.005–1.030)
pH: 5 (ref 5.0–8.0)

## 2021-05-04 NOTE — Telephone Encounter (Signed)
Told Megan Mitchell that her Hgb was a little low at 9.7 today. Her potassium was 3.1 which is a little low. Low normal is 3.5, Told her to eat a banana every other day. She has KCL 10 meq tablets on hand from PCP to take prn. Told her not to take the tablets as her kidney function is a little high per Megan Mitchell.  She denies urinary symptoms. Urine culture sent as u/a indicating possible UTI per Megan John, NP. The CBC and chemistries will be repeated the morning of her surgery 05-11-21. Pt requesting medication to help her sleep.  Told her that she needed to discuss with her PCP.  Pt verbalized understanding.

## 2021-05-06 ENCOUNTER — Ambulatory Visit (INDEPENDENT_AMBULATORY_CARE_PROVIDER_SITE_OTHER): Payer: 59 | Admitting: Clinical

## 2021-05-06 ENCOUNTER — Telehealth: Payer: Self-pay

## 2021-05-06 ENCOUNTER — Other Ambulatory Visit (HOSPITAL_COMMUNITY): Payer: Self-pay

## 2021-05-06 ENCOUNTER — Other Ambulatory Visit: Payer: Self-pay

## 2021-05-06 DIAGNOSIS — F411 Generalized anxiety disorder: Secondary | ICD-10-CM

## 2021-05-06 DIAGNOSIS — N390 Urinary tract infection, site not specified: Secondary | ICD-10-CM

## 2021-05-06 LAB — URINE CULTURE: Culture: >=100000 — AB

## 2021-05-06 MED ORDER — NITROFURANTOIN MONOHYD MACRO 100 MG PO CAPS
100.0000 mg | ORAL_CAPSULE | Freq: Two times a day (BID) | ORAL | 0 refills | Status: DC
  Filled 2021-05-06: qty 10, 5d supply, fill #0

## 2021-05-06 NOTE — BH Specialist Note (Signed)
Integrated Behavioral Health via Telemedicine Visit  05/06/2021 Megan Mitchell [id number]  Number of Integrated Behavioral Health visits: 3/6 Session Start time: 1:30  Session End time: 1:55 Total time:  25  Referring Provider: Dionisio David, NP Patient/Family location: [address], [place] [facility] Provider location: Patient [place] All persons participating in visit: patient, CSW Types of Service: Individual psychotherapy and Telephone visit  I connected with Megan Mitchell via Telephone and verified that I am speaking with the correct person using two identifiers. Discussed confidentiality: Yes   I discussed the limitations of telemedicine and the availability of in person appointments.  Discussed there is a possibility of technology failure and discussed alternative modes of communication if that failure occurs.  I discussed that engaging in this telemedicine visit, they consent to the provision of behavioral healthcare and the services will be billed under their insurance.  Patient and/or legal guardian expressed understanding and consented to Telemedicine visit: Yes   Presenting Concerns: Patient and/or family reports the following symptoms/concerns: anxiety, depression Duration of problem: several months; Severity of problem: moderate  Patient and/or Family's Strengths/Protective Factors: Social connections, Concrete supports in place (healthy food, safe environments, etc.), Sense of purpose, and religious beliefs   Goals Addressed: Patient will:  Reduce symptoms of: anxiety and depression   Demonstrate ability to: Increase healthy adjustment to current life circumstances  Progress towards Goals: Ongoing  Interventions: Interventions utilized:  Supportive Counseling Standardized Assessments completed: Not Needed  Supportive counseling today regarding patient's anxiety related to upcoming surgery. Patient reports improvement in anxiety symptoms in the last week.  She has increased awareness of connection between emotional and physical experience. She has continued to use breathing exercise learned in last week's session.   Patient and/or Family Response: Patient engaged in session. She exhibits insight into unhelpful thoughts exacerbating anxiety.  Assessment: Patient currently experiencing depression and anxiety exacerbated by health concerns. She is worried about the outcome of a surgery and biopsy now scheduled for July.  Patient may benefit from continued supportive counseling with CBT to address and challenge unhelpful thought processes and to learn new coping skills for dealing with anxiety.  Plan: Follow up with behavioral health clinician on: patient to call for follow up Referral(s): [place] (In Clinic)  I discussed the assessment and treatment plan with the patient and/or parent/guardian. They were provided an opportunity to ask questions and all were answered. They agreed with the plan and demonstrated an understanding of the instructions.   They were advised to call back or seek an in-person evaluation if the symptoms worsen or if the condition fails to improve as anticipated.  [name], [facility] [phone]

## 2021-05-06 NOTE — Anesthesia Preprocedure Evaluation (Addendum)
Anesthesia Evaluation  Patient identified by MRN, date of birth, ID band Patient awake    Reviewed: Allergy & Precautions, NPO status , Patient's Chart, lab work & pertinent test results  History of Anesthesia Complications Negative for: history of anesthetic complications  Airway Mallampati: II  TM Distance: >3 FB Neck ROM: Full    Dental  (+) Dental Advisory Given, Teeth Intact   Pulmonary neg pulmonary ROS,    Pulmonary exam normal        Cardiovascular hypertension, Pt. on medications Normal cardiovascular exam     Neuro/Psych PSYCHIATRIC DISORDERS Anxiety negative neurological ROS     GI/Hepatic negative GI ROS, Neg liver ROS,   Endo/Other   Obesity K 3.1   Renal/GU negative Renal ROS     Musculoskeletal  (+) Arthritis ,   Abdominal   Peds  Hematology  (+) anemia ,  Plt 701k    Anesthesia Other Findings   Reproductive/Obstetrics                           Anesthesia Physical Anesthesia Plan  ASA: 2  Anesthesia Plan: General   Post-op Pain Management:    Induction: Intravenous  PONV Risk Score and Plan: 3 and Treatment may vary due to age or medical condition, Ondansetron, Dexamethasone, Midazolam and Scopolamine patch - Pre-op  Airway Management Planned: Oral ETT  Additional Equipment: None  Intra-op Plan:   Post-operative Plan: Extubation in OR  Informed Consent: I have reviewed the patients History and Physical, chart, labs and discussed the procedure including the risks, benefits and alternatives for the proposed anesthesia with the patient or authorized representative who has indicated his/her understanding and acceptance.     Dental advisory given  Plan Discussed with: CRNA and Anesthesiologist  Anesthesia Plan Comments:       Anesthesia Quick Evaluation

## 2021-05-06 NOTE — Telephone Encounter (Signed)
Told Megan Mitchell that the urine culture shows that she has a UTI. Dr. Denman George is sending in [place] 100 mg bid for 5 days. Pt verbalized understanding.

## 2021-05-06 NOTE — Progress Notes (Signed)
Anesthesia Chart Review:   Case: [id number] Date/Time: 05/11/21 1315   Procedure: XI ROBOTIC ASSISTED TOTAL HYSTERECTOMY WITH BILATERAL SALPINGO OOPHORECTOMY;MINI LAPAROTOMY FOR CYST DECOMPRESSION; POSSIBLE STAGING (Bilateral)   Anesthesia type: General   Pre-op diagnosis: OVARIAN CYST AND FIBROIDS   Location: [facility] ROOM 03 / [facility]   Surgeons: Everitt Amber, MD       DISCUSSION: Pt is 50 years old with hx HTN, anemia   VS: BP 124/79   Pulse 90   Temp 37.2 C (Oral)   Resp 16   Ht 5\' 7"  (1.702 m)   Wt 108.9 kg   LMP  (Exact Date) Comment: last period oct 2021  SpO2 99%   BMI 37.59 kg/m   PROVIDERS: - PCP is Vevelyn Francois, NP   LABS:  - H/H 9.7/32.5. Baseline hgb ~10-12 (2018-2020).   - K 3.1 - Dr. Denman George has reviewed labs and wants them repeated day of surgery (per note by Baruch Merl, RN 05/04/21)   (all labs ordered are listed, but only abnormal results are displayed)  Labs Reviewed  CBC - Abnormal; Notable for the following components:      Result Value   Hemoglobin 9.7 (*)    HCT 32.5 (*)    MCV 78.7 (*)    MCH 23.5 (*)    MCHC 29.8 (*)    RDW 15.6 (*)    Platelets 701 (*)    All other components within normal limits  COMPREHENSIVE METABOLIC PANEL - Abnormal; Notable for the following components:   Potassium 3.1 (*)    Creatinine, Ser 1.06 (*)    Total Protein 8.5 (*)    AST 12 (*)    All other components within normal limits  URINALYSIS, ROUTINE W REFLEX MICROSCOPIC - Abnormal; Notable for the following components:   Color, Urine AMBER (*)    APPearance CLOUDY (*)    Protein, ur 100 (*)    Bacteria, UA RARE (*)    All other components within normal limits  TYPE AND SCREEN   EKG 05/04/21:  Sinus tachycardia Minimal voltage criteria for LVH, may be normal variant ( R in aVL ) Nonspecific T wave abnormality   CV: N/A  Past Medical History:  Diagnosis Date   Allergy    seasonal   Anemia    Anxiety    Arthritis    left knee   Fibroids     Hypertension    Ovarian cyst     Past Surgical History:  Procedure Laterality Date   CHOLECYSTECTOMY     COLONOSCOPY     ELBOW SURGERY     TUBAL LIGATION      MEDICATIONS:  acetaminophen (TYLENOL) 500 MG tablet   amLODipine (NORVASC) 10 MG tablet   busPIRone (BUSPAR) 5 MG tablet   ibuprofen (ADVIL) 800 MG tablet   losartan (COZAAR) 50 MG tablet   metaxalone (SKELAXIN) 800 MG tablet   nitrofurantoin, macrocrystal-monohydrate, (MACROBID) 100 MG capsule   senna-docusate (SENOKOT-S) 8.6-50 MG tablet   sertraline (ZOLOFT) 100 MG tablet   sertraline (ZOLOFT) 25 MG tablet   traMADol (ULTRAM) 50 MG tablet   triamterene-hydrochlorothiazide (MAXZIDE-25) 37.5-25 MG tablet   No current facility-administered medications for this encounter.    If labs acceptable day of surgery, I anticipate pt can proceed with surgery as scheduled.  Willeen Cass, PhD, FNP-BC [facility]/Anesthesiology Phone: [phone] 05/06/2021 2:51 PM

## 2021-05-07 ENCOUNTER — Telehealth: Payer: Self-pay

## 2021-05-07 ENCOUNTER — Other Ambulatory Visit (HOSPITAL_COMMUNITY): Payer: Self-pay

## 2021-05-07 ENCOUNTER — Ambulatory Visit: Payer: 59 | Admitting: Nurse Practitioner

## 2021-05-07 ENCOUNTER — Telehealth: Payer: Self-pay | Admitting: *Deleted

## 2021-05-07 MED FILL — Triamterene & Hydrochlorothiazide Tab 37.5-25 MG: ORAL | 90 days supply | Qty: 90 | Fill #1 | Status: AC

## 2021-05-07 NOTE — Telephone Encounter (Signed)
Ms Tabron stated today she began to experience some pain in the RLQ of her pelvis. She asked if she could take some tylenol for discomfort alone with the ATB macrobid for UTI. Told her that that would be fine. Pt verbalized understanding.

## 2021-05-07 NOTE — Telephone Encounter (Signed)
Fax paperwork to [facility]

## 2021-05-10 ENCOUNTER — Telehealth: Payer: Self-pay

## 2021-05-10 ENCOUNTER — Other Ambulatory Visit (HOSPITAL_COMMUNITY): Payer: Self-pay

## 2021-05-10 ENCOUNTER — Other Ambulatory Visit: Payer: Self-pay | Admitting: Gynecologic Oncology

## 2021-05-10 ENCOUNTER — Telehealth: Payer: Self-pay | Admitting: *Deleted

## 2021-05-10 DIAGNOSIS — R112 Nausea with vomiting, unspecified: Secondary | ICD-10-CM

## 2021-05-10 MED ORDER — ONDANSETRON 4 MG PO TBDP
4.0000 mg | ORAL_TABLET | Freq: Three times a day (TID) | ORAL | 0 refills | Status: DC | PRN
  Filled 2021-05-10: qty 2, 1d supply, fill #0

## 2021-05-10 MED ORDER — ONDANSETRON HCL 4 MG PO TABS
4.0000 mg | ORAL_TABLET | Freq: Three times a day (TID) | ORAL | 0 refills | Status: DC | PRN
  Filled 2021-05-10: qty 2, 1d supply, fill #0

## 2021-05-10 NOTE — Telephone Encounter (Signed)
Spoke with [name] and told her that Megan Mitchell will need repeat labs tomorrow am prior to surgery per Melissa Cross,NP. [name] noted this in her chart.

## 2021-05-10 NOTE — Telephone Encounter (Signed)
Patient called and stated "I am having some nausea, wanted to call and see what I can take before surgery tomorrow."

## 2021-05-10 NOTE — Telephone Encounter (Signed)
Megan Mitchell states that she understands her written pre-op instructions from 05-04-21.  Pt understandably anxious about surgery.

## 2021-05-10 NOTE — Progress Notes (Signed)
See RN note. Pt called reported nausea.

## 2021-05-10 NOTE — Telephone Encounter (Signed)
Spoke with Megan Mitchell and told her that Megan Mitchell will send in twp Zofran 4 mg ODT tabs that she can use on q 8 hours as needed for nausea.  Megan Mitchell does not want her to take any of this after midnight as she will receive some of this medication prior to surgery and this medication can slow down her bowels as well. Pt verbalized understanding

## 2021-05-11 ENCOUNTER — Ambulatory Visit (HOSPITAL_COMMUNITY)
Admission: RE | Admit: 2021-05-11 | Discharge: 2021-05-11 | Disposition: A | Discharge status: Home / Self Care | Payer: 59 | Attending: Gynecologic Oncology | Admitting: Gynecologic Oncology

## 2021-05-11 ENCOUNTER — Ambulatory Visit (HOSPITAL_COMMUNITY): Payer: 59 | Admitting: Emergency Medicine

## 2021-05-11 ENCOUNTER — Encounter (HOSPITAL_COMMUNITY): Payer: Self-pay | Admitting: Gynecologic Oncology

## 2021-05-11 ENCOUNTER — Encounter (HOSPITAL_COMMUNITY)
Admission: RE | Disposition: A | Discharge status: Home / Self Care | Payer: Self-pay | Source: Home / Self Care | Attending: Gynecologic Oncology

## 2021-05-11 ENCOUNTER — Ambulatory Visit (HOSPITAL_COMMUNITY): Payer: 59 | Admitting: Anesthesiology

## 2021-05-11 DIAGNOSIS — D219 Benign neoplasm of connective and other soft tissue, unspecified: Secondary | ICD-10-CM

## 2021-05-11 DIAGNOSIS — Z8249 Family history of ischemic heart disease and other diseases of the circulatory system: Secondary | ICD-10-CM | POA: Diagnosis not present

## 2021-05-11 DIAGNOSIS — N83202 Unspecified ovarian cyst, left side: Secondary | ICD-10-CM | POA: Insufficient documentation

## 2021-05-11 DIAGNOSIS — N83209 Unspecified ovarian cyst, unspecified side: Secondary | ICD-10-CM | POA: Diagnosis not present

## 2021-05-11 DIAGNOSIS — D270 Benign neoplasm of right ovary: Secondary | ICD-10-CM | POA: Insufficient documentation

## 2021-05-11 DIAGNOSIS — I10 Essential (primary) hypertension: Secondary | ICD-10-CM | POA: Diagnosis not present

## 2021-05-11 DIAGNOSIS — Z79899 Other long term (current) drug therapy: Secondary | ICD-10-CM | POA: Diagnosis not present

## 2021-05-11 DIAGNOSIS — D251 Intramural leiomyoma of uterus: Secondary | ICD-10-CM | POA: Insufficient documentation

## 2021-05-11 DIAGNOSIS — Z882 Allergy status to sulfonamides status: Secondary | ICD-10-CM | POA: Insufficient documentation

## 2021-05-11 DIAGNOSIS — D630 Anemia in neoplastic disease: Secondary | ICD-10-CM | POA: Diagnosis not present

## 2021-05-11 DIAGNOSIS — N390 Urinary tract infection, site not specified: Secondary | ICD-10-CM | POA: Insufficient documentation

## 2021-05-11 DIAGNOSIS — D259 Leiomyoma of uterus, unspecified: Secondary | ICD-10-CM | POA: Diagnosis not present

## 2021-05-11 DIAGNOSIS — K660 Peritoneal adhesions (postprocedural) (postinfection): Secondary | ICD-10-CM | POA: Diagnosis not present

## 2021-05-11 DIAGNOSIS — D271 Benign neoplasm of left ovary: Secondary | ICD-10-CM

## 2021-05-11 DIAGNOSIS — R1900 Intra-abdominal and pelvic swelling, mass and lump, unspecified site: Secondary | ICD-10-CM | POA: Diagnosis not present

## 2021-05-11 DIAGNOSIS — K658 Other peritonitis: Secondary | ICD-10-CM | POA: Diagnosis not present

## 2021-05-11 HISTORY — PX: ROBOTIC ASSISTED TOTAL HYSTERECTOMY WITH BILATERAL SALPINGO OOPHERECTOMY: SHX6086

## 2021-05-11 LAB — TYPE AND SCREEN
ABO/RH(D): A POS
Antibody Screen: NEGATIVE

## 2021-05-11 LAB — CBC WITH DIFFERENTIAL/PLATELET
Abs Immature Granulocytes: 0.12 10*3/uL — ABNORMAL HIGH (ref 0.00–0.07)
Basophils Absolute: 0.1 10*3/uL (ref 0.0–0.1)
Basophils Relative: 1 %
Eosinophils Absolute: 0 10*3/uL (ref 0.0–0.5)
Eosinophils Relative: 0 %
HCT: 33.5 % — ABNORMAL LOW (ref 36.0–46.0)
Hemoglobin: 10.2 g/dL — ABNORMAL LOW (ref 12.0–15.0)
Immature Granulocytes: 1 %
Lymphocytes Relative: 21 %
Lymphs Abs: 2 10*3/uL (ref 0.7–4.0)
MCH: 23.5 pg — ABNORMAL LOW (ref 26.0–34.0)
MCHC: 30.4 g/dL (ref 30.0–36.0)
MCV: 77.2 fL — ABNORMAL LOW (ref 80.0–100.0)
Monocytes Absolute: 0.4 10*3/uL (ref 0.1–1.0)
Monocytes Relative: 4 %
Neutro Abs: 7.3 10*3/uL (ref 1.7–7.7)
Neutrophils Relative %: 73 %
Platelets: 760 10*3/uL — ABNORMAL HIGH (ref 150–400)
RBC: 4.34 MIL/uL (ref 3.87–5.11)
RDW: 15.4 % (ref 11.5–15.5)
WBC: 9.9 10*3/uL (ref 4.0–10.5)
nRBC: 0 % (ref 0.0–0.2)

## 2021-05-11 LAB — BASIC METABOLIC PANEL
Anion gap: 13 (ref 5–15)
BUN: 18 mg/dL (ref 6–20)
CO2: 23 mmol/L (ref 22–32)
Calcium: 10 mg/dL (ref 8.9–10.3)
Chloride: 101 mmol/L (ref 98–111)
Creatinine, Ser: 1.12 mg/dL — ABNORMAL HIGH (ref 0.44–1.00)
GFR, Estimated: 59 mL/min — ABNORMAL LOW (ref >60)
Glucose, Bld: 110 mg/dL — ABNORMAL HIGH (ref 70–99)
Potassium: 3.5 mmol/L (ref 3.5–5.1)
Sodium: 137 mmol/L (ref 135–145)

## 2021-05-11 LAB — PREGNANCY, URINE: Preg Test, Ur: NEGATIVE

## 2021-05-11 LAB — ABO/RH: ABO/RH(D): A POS

## 2021-05-11 SURGERY — HYSTERECTOMY, TOTAL, ROBOT-ASSISTED, LAPAROSCOPIC, WITH BILATERAL SALPINGO-OOPHORECTOMY
Anesthesia: General | Laterality: Bilateral

## 2021-05-11 MED ORDER — GLYCOPYRROLATE PF 0.2 MG/ML IJ SOSY
PREFILLED_SYRINGE | INTRAMUSCULAR | Status: AC
  Filled 2021-05-11: qty 2

## 2021-05-11 MED ORDER — LIDOCAINE HCL (PF) 2 % IJ SOLN
INTRAMUSCULAR | Status: DC | PRN
  Administered 2021-05-11: 1.5 mg/kg/h via INTRADERMAL

## 2021-05-11 MED ORDER — PHENYLEPHRINE HCL (PRESSORS) 10 MG/ML IV SOLN
INTRAVENOUS | Status: AC
  Filled 2021-05-11: qty 1

## 2021-05-11 MED ORDER — PHENYLEPHRINE HCL-NACL 10-0.9 MG/250ML-% IV SOLN
INTRAVENOUS | Status: DC | PRN
  Administered 2021-05-11: 50 ug/min via INTRAVENOUS

## 2021-05-11 MED ORDER — ACETAMINOPHEN 500 MG PO TABS
1000.0000 mg | ORAL_TABLET | ORAL | Status: AC
  Administered 2021-05-11: 1000 mg via ORAL
  Filled 2021-05-11: qty 2

## 2021-05-11 MED ORDER — PROMETHAZINE HCL 25 MG/ML IJ SOLN
INTRAMUSCULAR | Status: AC
  Filled 2021-05-11: qty 1

## 2021-05-11 MED ORDER — LACTATED RINGERS IR SOLN
Status: DC | PRN
  Administered 2021-05-11: 1000 mL

## 2021-05-11 MED ORDER — DEXAMETHASONE SODIUM PHOSPHATE 4 MG/ML IJ SOLN: 4.0000 mg | INTRAMUSCULAR | Status: DC

## 2021-05-11 MED ORDER — ONDANSETRON HCL 4 MG/2ML IJ SOLN
INTRAMUSCULAR | Status: AC
  Filled 2021-05-11: qty 2

## 2021-05-11 MED ORDER — ENOXAPARIN SODIUM 40 MG/0.4ML IJ SOSY
40.0000 mg | PREFILLED_SYRINGE | INTRAMUSCULAR | Status: AC
  Administered 2021-05-11: 40 mg via SUBCUTANEOUS
  Filled 2021-05-11: qty 0.4

## 2021-05-11 MED ORDER — PROMETHAZINE HCL 25 MG/ML IJ SOLN
6.2500 mg | INTRAMUSCULAR | Status: DC | PRN
  Administered 2021-05-11: 6.25 mg via INTRAVENOUS

## 2021-05-11 MED ORDER — ORAL CARE MOUTH RINSE: 15.0000 mL | Freq: Once | OROMUCOSAL | Status: AC

## 2021-05-11 MED ORDER — BUPIVACAINE LIPOSOME 1.3 % IJ SUSP
20.0000 mL | Freq: Once | INTRAMUSCULAR | Status: AC
  Administered 2021-05-11: 20 mL
  Filled 2021-05-11: qty 20

## 2021-05-11 MED ORDER — ACETAMINOPHEN 500 MG PO TABS: 1000.0000 mg | ORAL_TABLET | Freq: Once | ORAL | Status: DC

## 2021-05-11 MED ORDER — GLYCOPYRROLATE PF 0.2 MG/ML IJ SOSY
PREFILLED_SYRINGE | INTRAMUSCULAR | Status: DC | PRN
  Administered 2021-05-11: .2 mg via INTRAVENOUS

## 2021-05-11 MED ORDER — CHLORHEXIDINE GLUCONATE 0.12 % MT SOLN
15.0000 mL | Freq: Once | OROMUCOSAL | Status: AC
  Administered 2021-05-11: 15 mL via OROMUCOSAL

## 2021-05-11 MED ORDER — KETOROLAC TROMETHAMINE 15 MG/ML IJ SOLN
15.0000 mg | INTRAMUSCULAR | Status: AC
  Administered 2021-05-11: 15 mg via INTRAVENOUS
  Filled 2021-05-11: qty 1

## 2021-05-11 MED ORDER — SODIUM CHLORIDE 0.9 % IV SOLN
2.0000 g | INTRAVENOUS | Status: AC
  Administered 2021-05-11: 2 g via INTRAVENOUS
  Filled 2021-05-11: qty 2

## 2021-05-11 MED ORDER — SODIUM CHLORIDE 0.9% FLUSH: 3.0000 mL | Freq: Two times a day (BID) | INTRAVENOUS | Status: DC

## 2021-05-11 MED ORDER — SCOPOLAMINE 1 MG/3DAYS TD PT72
1.0000 | MEDICATED_PATCH | TRANSDERMAL | Status: DC
  Administered 2021-05-11: 1.5 mg via TRANSDERMAL
  Filled 2021-05-11: qty 1

## 2021-05-11 MED ORDER — DEXAMETHASONE SODIUM PHOSPHATE 10 MG/ML IJ SOLN
INTRAMUSCULAR | Status: DC | PRN
  Administered 2021-05-11: 10 mg via INTRAVENOUS

## 2021-05-11 MED ORDER — LIDOCAINE 2% (20 MG/ML) 5 ML SYRINGE
INTRAMUSCULAR | Status: AC
  Filled 2021-05-11: qty 5

## 2021-05-11 MED ORDER — FENTANYL CITRATE (PF) 250 MCG/5ML IJ SOLN
INTRAMUSCULAR | Status: AC
  Filled 2021-05-11: qty 5

## 2021-05-11 MED ORDER — SUGAMMADEX SODIUM 200 MG/2ML IV SOLN
INTRAVENOUS | Status: DC | PRN
  Administered 2021-05-11: 400 mg via INTRAVENOUS

## 2021-05-11 MED ORDER — FENTANYL CITRATE (PF) 100 MCG/2ML IJ SOLN
25.0000 ug | INTRAMUSCULAR | Status: DC | PRN
  Administered 2021-05-11 (×2): 25 ug via INTRAVENOUS

## 2021-05-11 MED ORDER — MIDAZOLAM HCL 5 MG/5ML IJ SOLN
INTRAMUSCULAR | Status: DC | PRN
  Administered 2021-05-11: 2 mg via INTRAVENOUS

## 2021-05-11 MED ORDER — FENTANYL CITRATE (PF) 100 MCG/2ML IJ SOLN
INTRAMUSCULAR | Status: AC
  Filled 2021-05-11: qty 2

## 2021-05-11 MED ORDER — OXYCODONE HCL 5 MG/5ML PO SOLN: 5.0000 mg | Freq: Once | ORAL | Status: AC | PRN

## 2021-05-11 MED ORDER — DEXAMETHASONE SODIUM PHOSPHATE 10 MG/ML IJ SOLN
INTRAMUSCULAR | Status: AC
  Filled 2021-05-11: qty 1

## 2021-05-11 MED ORDER — FENTANYL CITRATE (PF) 100 MCG/2ML IJ SOLN
INTRAMUSCULAR | Status: DC | PRN
  Administered 2021-05-11: 100 ug via INTRAVENOUS
  Administered 2021-05-11 (×2): 50 ug via INTRAVENOUS

## 2021-05-11 MED ORDER — KETAMINE HCL 10 MG/ML IJ SOLN
INTRAMUSCULAR | Status: AC
  Filled 2021-05-11: qty 1

## 2021-05-11 MED ORDER — METOCLOPRAMIDE HCL 5 MG/ML IJ SOLN
INTRAMUSCULAR | Status: DC | PRN
  Administered 2021-05-11: 10 mg via INTRAVENOUS

## 2021-05-11 MED ORDER — MIDAZOLAM HCL 2 MG/2ML IJ SOLN
INTRAMUSCULAR | Status: AC
  Filled 2021-05-11: qty 2

## 2021-05-11 MED ORDER — ROCURONIUM BROMIDE 10 MG/ML (PF) SYRINGE
PREFILLED_SYRINGE | INTRAVENOUS | Status: DC | PRN
  Administered 2021-05-11: 40 mg via INTRAVENOUS
  Administered 2021-05-11: 10 mg via INTRAVENOUS
  Administered 2021-05-11: 30 mg via INTRAVENOUS
  Administered 2021-05-11: 20 mg via INTRAVENOUS
  Administered 2021-05-11: 60 mg via INTRAVENOUS

## 2021-05-11 MED ORDER — OXYCODONE HCL 5 MG PO TABS
5.0000 mg | ORAL_TABLET | Freq: Once | ORAL | Status: AC | PRN
  Administered 2021-05-11: 5 mg via ORAL

## 2021-05-11 MED ORDER — ROCURONIUM BROMIDE 10 MG/ML (PF) SYRINGE
PREFILLED_SYRINGE | INTRAVENOUS | Status: AC
  Filled 2021-05-11: qty 10

## 2021-05-11 MED ORDER — SUCCINYLCHOLINE CHLORIDE 200 MG/10ML IV SOSY
PREFILLED_SYRINGE | INTRAVENOUS | Status: AC
  Filled 2021-05-11: qty 10

## 2021-05-11 MED ORDER — ONDANSETRON HCL 4 MG/2ML IJ SOLN
INTRAMUSCULAR | Status: AC
  Filled 2021-05-11: qty 4

## 2021-05-11 MED ORDER — KETAMINE HCL 10 MG/ML IJ SOLN
INTRAMUSCULAR | Status: DC | PRN
  Administered 2021-05-11: 20 mg via INTRAVENOUS
  Administered 2021-05-11 (×2): 10 mg via INTRAVENOUS

## 2021-05-11 MED ORDER — BUPIVACAINE HCL 0.25 % IJ SOLN
INTRAMUSCULAR | Status: DC | PRN
  Administered 2021-05-11: 15 mL

## 2021-05-11 MED ORDER — LACTATED RINGERS IV SOLN: INTRAVENOUS | Status: DC

## 2021-05-11 MED ORDER — ONDANSETRON HCL 4 MG/2ML IJ SOLN
INTRAMUSCULAR | Status: DC | PRN
  Administered 2021-05-11: 4 mg via INTRAVENOUS

## 2021-05-11 MED ORDER — LIDOCAINE 2% (20 MG/ML) 5 ML SYRINGE
INTRAMUSCULAR | Status: DC | PRN
  Administered 2021-05-11: 60 mg via INTRAVENOUS

## 2021-05-11 MED ORDER — PROPOFOL 10 MG/ML IV BOLUS
INTRAVENOUS | Status: AC
  Filled 2021-05-11: qty 20

## 2021-05-11 MED ORDER — PROPOFOL 10 MG/ML IV BOLUS
INTRAVENOUS | Status: DC | PRN
  Administered 2021-05-11: 140 mg via INTRAVENOUS

## 2021-05-11 MED ORDER — BUPIVACAINE HCL 0.25 % IJ SOLN
INTRAMUSCULAR | Status: AC
  Filled 2021-05-11: qty 1

## 2021-05-11 MED ORDER — ROCURONIUM BROMIDE 10 MG/ML (PF) SYRINGE
PREFILLED_SYRINGE | INTRAVENOUS | Status: AC
  Filled 2021-05-11: qty 30

## 2021-05-11 MED ORDER — OXYCODONE HCL 5 MG PO TABS
ORAL_TABLET | ORAL | Status: AC
  Filled 2021-05-11: qty 1

## 2021-05-11 MED ORDER — STERILE WATER FOR IRRIGATION IR SOLN
Status: DC | PRN
  Administered 2021-05-11: 1000 mL

## 2021-05-11 MED ORDER — DEXAMETHASONE SODIUM PHOSPHATE 10 MG/ML IJ SOLN
INTRAMUSCULAR | Status: AC
  Filled 2021-05-11: qty 2

## 2021-05-11 SURGICAL SUPPLY — 61 items
ADH SKN CLS APL DERMABOND .7 (GAUZE/BANDAGES/DRESSINGS) ×1
BACTOSHIELD CHG 4% 4OZ (MISCELLANEOUS) ×1
BINDER ABDOMINAL 12 ML 46-62 (SOFTGOODS) ×1 IMPLANT
BLADE SURG SZ10 CARB STEEL (BLADE) ×1 IMPLANT
CNTNR URN SCR LID CUP LEK RST (MISCELLANEOUS) IMPLANT
CONT SPEC 4OZ STRL OR WHT (MISCELLANEOUS) ×2
COVER BACK TABLE 60X90IN (DRAPES) ×2 IMPLANT
COVER TIP SHEARS 8 DVNC (MISCELLANEOUS) ×1 IMPLANT
COVER TIP SHEARS 8MM DA VINCI (MISCELLANEOUS) ×2
DERMABOND ADVANCED (GAUZE/BANDAGES/DRESSINGS) ×1
DERMABOND ADVANCED .7 DNX12 (GAUZE/BANDAGES/DRESSINGS) ×1 IMPLANT
DRAPE ARM DVNC X/XI (DISPOSABLE) ×4 IMPLANT
DRAPE COLUMN DVNC XI (DISPOSABLE) ×1 IMPLANT
DRAPE DA VINCI XI ARM (DISPOSABLE) ×8
DRAPE DA VINCI XI COLUMN (DISPOSABLE) ×2
DRAPE SHEET LG 3/4 BI-LAMINATE (DRAPES) ×2 IMPLANT
DRAPE SURG IRRIG POUCH 19X23 (DRAPES) ×2 IMPLANT
DRSG OPSITE POSTOP 4X6 (GAUZE/BANDAGES/DRESSINGS) ×1 IMPLANT
DRSG OPSITE POSTOP 4X8 (GAUZE/BANDAGES/DRESSINGS) IMPLANT
ELECT PENCIL ROCKER SW 15FT (MISCELLANEOUS) ×1 IMPLANT
ELECT REM PT RETURN 15FT ADLT (MISCELLANEOUS) ×2 IMPLANT
GLOVE SURG ENC MOIS LTX SZ6 (GLOVE) ×8 IMPLANT
GLOVE SURG ENC MOIS LTX SZ6.5 (GLOVE) ×4 IMPLANT
GOWN STRL REUS W/ TWL LRG LVL3 (GOWN DISPOSABLE) ×4 IMPLANT
GOWN STRL REUS W/TWL LRG LVL3 (GOWN DISPOSABLE) ×8
IRRIG SUCT STRYKERFLOW 2 WTIP (MISCELLANEOUS) ×2
IRRIGATION SUCT STRKRFLW 2 WTP (MISCELLANEOUS) ×1 IMPLANT
KIT TURNOVER KIT A (KITS) ×2 IMPLANT
MANIPULATOR UTERINE 4.5 ZUMI (MISCELLANEOUS) ×2 IMPLANT
NEEDLE HYPO 22GX1.5 SAFETY (NEEDLE) ×2 IMPLANT
OBTURATOR OPTICAL STANDARD 8MM (TROCAR) ×2
OBTURATOR OPTICAL STND 8 DVNC (TROCAR) ×1
OBTURATOR OPTICALSTD 8 DVNC (TROCAR) ×1 IMPLANT
PACK ROBOT GYN CUSTOM WL (TRAY / TRAY PROCEDURE) ×2 IMPLANT
PAD POSITIONING PINK XL (MISCELLANEOUS) ×2 IMPLANT
PORT ACCESS TROCAR AIRSEAL 12 (TROCAR) IMPLANT
PORT ACCESS TROCAR AIRSEAL 5M (TROCAR) ×2
PORT LAP GEL ALEXIS MED 5-9CM (MISCELLANEOUS) ×1 IMPLANT
SCRUB CHG 4% DYNA-HEX 4OZ (MISCELLANEOUS) ×1 IMPLANT
SEAL CANN UNIV 5-8 DVNC XI (MISCELLANEOUS) ×3 IMPLANT
SEAL XI 5MM-8MM UNIVERSAL (MISCELLANEOUS) ×8
SET TRI-LUMEN FLTR TB AIRSEAL (TUBING) ×3 IMPLANT
SPONGE T-LAP 18X18 ~~LOC~~+RFID (SPONGE) ×2 IMPLANT
SUT MNCRL AB 4-0 PS2 18 (SUTURE) ×2 IMPLANT
SUT PDS AB 1 TP1 96 (SUTURE) ×2 IMPLANT
SUT VIC AB 0 CT1 27 (SUTURE) ×2
SUT VIC AB 0 CT1 27XBRD ANTBC (SUTURE) ×1 IMPLANT
SUT VIC AB 2-0 CT1 27 (SUTURE) ×2
SUT VIC AB 2-0 CT1 TAPERPNT 27 (SUTURE) IMPLANT
SUT VIC AB 4-0 PS2 18 (SUTURE) ×5 IMPLANT
SUT VICRYL 0 UR6 27IN ABS (SUTURE) ×1 IMPLANT
SUT VLOC 180 0 9IN  GS21 (SUTURE) ×2
SUT VLOC 180 0 9IN GS21 (SUTURE) ×1 IMPLANT
SYR 20ML LL LF (SYRINGE) ×1 IMPLANT
SYR 50ML LL SCALE MARK (SYRINGE) ×1 IMPLANT
SYR BULB IRRIG 60ML STRL (SYRINGE) ×1 IMPLANT
TOWEL OR NON WOVEN STRL DISP B (DISPOSABLE) ×2 IMPLANT
TRAY FOLEY MTR SLVR 16FR STAT (SET/KITS/TRAYS/PACK) ×2 IMPLANT
UNDERPAD 30X36 HEAVY ABSORB (UNDERPADS AND DIAPERS) ×2 IMPLANT
WATER STERILE IRR 1000ML POUR (IV SOLUTION) ×2 IMPLANT
YANKAUER SUCT BULB TIP 10FT TU (MISCELLANEOUS) ×1 IMPLANT

## 2021-05-11 NOTE — H&P (Signed)
Follow-up Note: Gyn-Onc  Consult was requested by Earnstine Regal, NP for the evaluation of Megan Mitchell 50 y.o. female  CC:  Pelvic mass.   Assessment/Plan:  Megan Mitchell  is a 50 y.o.  year old with a 30 cm cystic pelvic and abdominal mass likely arising from an ovary, fibroid uterus, mildly elevated Ca1 25.  I reviewed this MRI imaging with the patient.  I explained that the predominantly cystic nature of this lesion was somewhat reassuring.  I explained the low sensitivity of mildly elevated Ca1 25 and diagnosing cancer.  Due to the large nature of this lesion, the possibility for occult cancer, and the symptomatic nature of this process, I am recommending surgical intervention.  I think she would be a good candidate for mini laparotomy for cyst decompression followed by robotic assisted total hysterectomy and BSO, possible staging if malignancy is identified on frozen section.  I explained this procedure to the patient and its potential risks.  I expect same-day discharge.  I expect 4 weeks of time off of work.  I counseled the patient regarding anticipated convalescence and restrictions postoperatively.  HPI: Megan Mitchell is a 50 year old P2 who was seen in consultation at the request of Earnstine Regal, NP for evaluation of a 30 cm pelvic mass.  The patient began experiencing abdominal distention and bloating symptoms in December 2021.  She attempted to be seen by her primary care doctor at that time however due to [place] pandemic precautions and difficulty making an appointment she was not able to make 1.  Her PCP then left the practice and she had to reestablish care with another PCP.  This was taking some time and therefore she established care with an OB/GYN provider, Earnstine Regal, NP.  Megan Mitchell saw the patient on 01/26/21 and ordered a pelvic ultrasound scan performed ton 04/01/21 for a known history of fibroids.  The ultrasound scan identified a uterus measuring 8.6 x 4.2 x 9.2  cm.  There were multiple intramural, submucosal, and subserosal fibroids within.  The left ovary appeared to measure 2.8 x 2 x 2.2 cm.  The right ovary was not clearly seen.  A cystic structure was seen extending from the right adnexa into the abdomen and adjacent to the liver.  It measured at least 26 cm in greatest dimension on ultrasound.  To follow-up this ultrasound finding an MRI of the pelvis and abdomen was performed on 04/05/2021.  This revealed a 30 x 18.8 x 28 cm cystic mass in the abdomen and pelvis anteriorly.  Somewhat septated complex lobulations along the inferior margin.  There was a solid component posteriorly and inferiorly measuring 5.6 cm.  There was no apparent ascites or adenopathy or peritoneal disease.  The uterus measured 12 x 11 x 11 cm on MRI dimensions.  It contained fibroids.  A Ca1 25 was drawn on 6-22 and was mildly elevated at 67.  Interval Hx:  She was scheduled for surgery in June, but cancelled due to a gastrointestinal illness.   Current Meds:  Outpatient Encounter Medications as of 04/08/2021  Medication Sig   acetaminophen (TYLENOL) 500 MG tablet Take 500 mg by mouth every 6 (six) hours as needed.   amLODipine (NORVASC) 10 MG tablet TAKE 1 TABLET BY MOUTH DAILY.   losartan (COZAAR) 50 MG tablet TAKE 1 TABLET BY MOUTH ONCE DAILY   Probiotic Product (PROBIOTIC PO) Take by mouth.   sertraline (ZOLOFT) 100 MG tablet TAKE 1 TABLET BY  MOUTH DAILY.   triamterene-hydrochlorothiazide (MAXZIDE-25) 37.5-25 MG tablet TAKE 1 TABLET BY MOUTH DAILY.   diclofenac (CATAFLAM) 50 MG tablet Take 2 tablets by mouth initially, then 1 tablet after a meal twice daily as needed for pain. (Patient not taking: Reported on 04/07/2021)   HYDROcodone-acetaminophen (NORCO/VICODIN) 5-325 MG tablet Take 1 tablet by mouth every 6 hours as needed for breakthrough pain (Patient not taking: Reported on 04/07/2021)   Multiple Vitamin (MULTIVITAMIN) tablet Take 1 tablet by mouth daily. (Patient not  taking: Reported on 04/07/2021)   [DISCONTINUED] pantoprazole (PROTONIX) 40 MG tablet TAKE 1 TABLET (40 MG TOTAL) BY MOUTH DAILY.   [DISCONTINUED] potassium chloride SA (KLOR-CON) 20 MEQ tablet Take 1 tablet (20 mEq total) by mouth daily. (Patient not taking: Reported on 01/19/2021)   No facility-administered encounter medications on file as of 04/08/2021.    Allergy:  Allergies  Allergen Reactions   Sulfa Antibiotics Rash    Makes mouth break out with a rash    Social Hx:   Social History   Socioeconomic History   Marital status: Married    Spouse name: Not on file   Number of children: 2   Years of education: Not on file   Highest education level: Not on file  Occupational History   Not on file  Tobacco Use   Smoking status: Never   Smokeless tobacco: Never  Vaping Use   Vaping Use: Never used  Substance and Sexual Activity   Alcohol use: No   Drug use: No   Sexual activity: Yes    Birth control/protection: Surgical  Other Topics Concern   Not on file  Social History Narrative   Not on file   Social Determinants of Health   Financial Resource Strain: Not on file  Food Insecurity: Not on file  Transportation Needs: Not on file  Physical Activity: Not on file  Stress: Not on file  Social Connections: Not on file  Intimate Partner Violence: Not on file    Past Surgical Hx:  Past Surgical History:  Procedure Laterality Date   CHOLECYSTECTOMY     COLONOSCOPY     ELBOW SURGERY     TUBAL LIGATION      Past Medical Hx:  Past Medical History:  Diagnosis Date   Allergy    seasonal   Anemia    Anxiety    Arthritis    left knee   Fibroids    Hypertension    Ovarian cyst     Past Gynecological History: SVD x2, pap normal on 01/26/21 with negative high risk HPV No LMP recorded. (Menstrual status: Perimenopausal).  Family Hx:  Family History  Problem Relation Age of Onset   Hypertension Mother    Colon cancer Neg Hx    Colon polyps Neg Hx    Esophageal  cancer Neg Hx    Rectal cancer Neg Hx    Stomach cancer Neg Hx     Review of Systems:  Constitutional  Feels well,    ENT Normal appearing ears and nares bilaterally Skin/Breast  No rash, sores, jaundice, itching, dryness Cardiovascular  No chest pain, shortness of breath, or edema  Pulmonary  No cough or wheeze.  Gastro Intestinal  No nausea, vomitting, or diarrhoea. No bright red blood per rectum, no abdominal pain, change in bowel movement, or constipation. + bloating and distension Genito Urinary  No frequency, urgency, dysuria, + urinary frequency Musculo Skeletal  No myalgia, arthralgia, joint swelling or pain  Neurologic  No weakness,  numbness, change in gait,  Psychology  No depression, anxiety, insomnia.   Vitals:  There were no vitals taken for this visit.  Physical Exam: WD in NAD Neck  Supple NROM, without any enlargements.  Lymph Node Survey No cervical supraclavicular or inguinal adenopathy Cardiovascular  Pulse normal rate, regularity and rhythm. S1 and S2 normal.  Lungs  Clear to auscultation bilateraly, without wheezes/crackles/rhonchi. Good air movement.  Skin  No rash/lesions/breakdown  Psychiatry  Alert and oriented to person, place, and time  Abdomen  Normoactive bowel sounds, abdomen non-tender and obese without evidence of hernia.  Very distended abdomen somewhat tense with a cystic mass filling the abdomen and pelvis to the level of the xiphoid. Back No CVA tenderness Genito Urinary  Vulva/vagina: Normal external female genitalia.  No lesions. No discharge or bleeding.  Bladder/urethra:  No lesions or masses, well supported bladder  Vagina: normal  Cervix: Normal appearing, no lesions.  Uterus:  Bulky mobile, no parametrial involvement or nodularity. There is calcified hardness posteriorally most consistent with fibroids  Adnexa: abdomino pelvic mass extending to xiphoid appreciated Rectal  Good tone, no masses no cul de sac nodularity  however the calcified posterior uterus is appreciated.  Extremities  No bilateral cyanosis, clubbing or edema.   Thereasa Solo, MD  05/11/2021, 11:40 AM

## 2021-05-11 NOTE — Discharge Instructions (Signed)
Return to work: 4 weeks (2 weeks with physical restrictions).  Activity: 1. Be up and out of the bed during the day.  Take a nap if needed.  You may walk up steps but be careful and use the hand rail.  Stair climbing will tire you more than you think, you may need to stop part way and rest.   2. No lifting or straining for 4 weeks.  3. No driving for 1 weeks.  Do Not drive if you are taking narcotic pain medicine.  4. Shower daily.  Use soap and water on your incision and pat dry; don't rub.   5. No sexual activity and nothing in the vagina for 8 weeks.  Medications:  - Take ibuprofen and tylenol first line for pain control. Take these regularly (every 6 hours) to decrease the build up of pain.  - If necessary, for severe pain not relieved by ibuprofen, contact Dr Serita Grit office and you will be prescribed percocet.  - While taking percocet you should take sennakot every night to reduce the likelihood of constipation. If this causes diarrhea, stop its use.  Diet: 1. Low sodium Heart Healthy Diet is recommended.  2. It is safe to use a laxative if you have difficulty moving your bowels.   Wound Care: 1. Keep clean and dry.  Shower daily.  Reasons to call the Doctor:  Fever - Oral temperature greater than 100.4 degrees Fahrenheit Foul-smelling vaginal discharge Difficulty urinating Nausea and vomiting Increased pain at the site of the incision that is unrelieved with pain medicine. Difficulty breathing with or without chest pain New calf pain especially if only on one side Sudden, continuing increased vaginal bleeding with or without clots.   Follow-up: 1. See Megan Mitchell in 4 weeks.  Contacts: For questions or concerns you should contact:  Dr. Everitt Mitchell at [phone] After hours and on week-ends call [phone] and ask to speak to the physician on call for Gynecologic Oncology

## 2021-05-11 NOTE — Anesthesia Postprocedure Evaluation (Signed)
Anesthesia Post Note  Patient: Megan Mitchell  Procedure(s) Performed: XI ROBOTIC ASSISTED TOTAL HYSTERECTOMY GREATER THAN 250 GRAMS WITH BILATERAL SALPINGO OOPHORECTOMY;MINI LAPAROTOMY FOR CYST DECOMPRESSION;OMENTECTOMY (Bilateral)     Patient location during evaluation: PACU Anesthesia Type: General Level of consciousness: awake and alert Pain management: pain level controlled Vital Signs Assessment: post-procedure vital signs reviewed and stable Respiratory status: spontaneous breathing, nonlabored ventilation and respiratory function stable Cardiovascular status: blood pressure returned to baseline and stable Postop Assessment: no apparent nausea or vomiting Anesthetic complications: no   No notable events documented.  Last Vitals:  Vitals:   05/11/21 1845 05/11/21 1900  BP: 139/74 (!) 120/56  Pulse: 81 89  Resp: 16 12  Temp:  36.7 C  SpO2: 97% 99%                   Audry Pili

## 2021-05-11 NOTE — Op Note (Signed)
OPERATIVE NOTE 05/11/21  Surgeon: Donaciano Eva   Assistants: Dr Lahoma Crocker (an MD assistant was necessary for tissue manipulation, management of robotic instrumentation, retraction and positioning due to the complexity of the case and hospital policies).   Anesthesia: General endotracheal anesthesia  ASA Class: 3   Pre-operative Diagnosis:  30cm cystic mass, elevated CA 125, fibroid uterus  Post-operative Diagnosis:  benign left ovarian cystic mass, fibroid uterus  Operation: Robotic-assisted laparoscopic total hysterectomy >250gm with bilateral salpingoophorectomy, omentectomy, minilaparotomy for cyst decompression.  Surgeon: Donaciano Eva  Assistant Surgeon: Lahoma Crocker MD  Anesthesia: GET  Urine Output: 150cc  Operative Findings:  : 30cm cystic mass with solid area (10cm) at its base, densely adherent to anterior abdominal wall and omentum. Fibroid uterus with bulky posterior lower uterine segment fibroid. 16cm uterus. Normal appearing right tube and ovary. Normal upper abdomen, normal liver, normal omentum.   Estimated Blood Loss:  200 mL      Total IV Fluids: 800 ml         Specimens:  washings, left tube and ovary, uterus with cervix and right tube and ovary, omentum.         Complications:  None; patient tolerated the procedure well.         Disposition: PACU - hemodynamically stable.  Procedure Details  The patient was seen in the Holding Room. The risks, benefits, complications, treatment options, and expected outcomes were discussed with the patient.  The patient concurred with the proposed plan, giving informed consent.  The site of surgery properly noted/marked. The patient was identified as Therapist, occupational and the procedure verified as a Robotic-assisted hysterectomy with bilateral salpingo oophorectomy. A Time Out was held and the above information confirmed.  After induction of anesthesia, the patient was draped and prepped in the  usual sterile manner. Pt was placed in supine position after anesthesia and draped and prepped in the usual sterile manner. The abdominal drape was placed after the CholoraPrep had been allowed to dry for 3 minutes.  Her arms were tucked to her side with all appropriate precautions.  The shoulders were stabilized with padded shoulder blocks applied to the acromium processes.  The patient was placed in the semi-lithotomy position in [place].  The perineum was prepped with Betadine. The patient was then prepped. Foley catheter was placed.  A sterile speculum was placed in the vagina.  The cervix was grasped with a single-tooth tenaculum and dilated with Kennon Rounds dilators.  The ZUMI uterine manipulator with a medium colpotomizer ring was placed without difficulty.  A pneum occluder balloon was placed over the manipulator.  OG tube placement was confirmed and to suction.   A 6cm supraumbilical vertical incision was made with the scalpel. The subcutaneous skin was opened in the midline. The peritoneal incision was extended superiorly. The ovary was identified and freed manually of its adhesions to the anterior abdominal wall. Washings were taken. The gall bladder trochar was used to cause cyst decompression for 8000cc of dark brown colored fluid consistent with old blood. The hole was oversewn with 0-vicryl. The alexis retractor and cap were placed and the robotic port was placed and the abdomen insufflated.  Next, a 12 mm skin incision was made 1 cm below the subcostal margin in the midclavicular line.  The 12 mm airseal port was placed.  Next, an 37mm skin incision was made lateral to the midline port on the right and left side.  A fourth arm was placed in the  left lower quadrant 2 cm above and superior and medial to the anterior superior iliac spine.  All ports were placed under direct visualization.  The patient was placed in steep Trendelenburg.  Bowel was folded away into the upper abdomen.  The robot was  docked in the normal manner.  The hysterectomy was started after the round ligament on the right side was incised and the retroperitoneum was entered and the pararectal space was developed.  The ureter was noted to be on the medial leaf of the broad ligament.  The peritoneum above the ureter was incised and stretched and the infundibulopelvic ligament was skeletonized, cauterized and cut.  The posterior peritoneum was taken down to the level of the KOH ring. This was a challenging dissection due to a bulky posterior fibroid in the lower uterine segment.  The anterior peritoneum was also taken down.  The bladder flap was created to the level of the KOH ring.  The uterine artery on the right side was skeletonized, cauterized and cut in the normal manner.  A similar procedure was performed on the left.  The colpotomy was made and the uterus, cervix, bilateral ovaries and tubes were amputated. The specimen was retrieved to the abdominal incision.  It required morcellation in a contained manner to extract the specimen due to its  bulky fibroid size. The left tube and ovary were separated from the uterus and noted to be adherent to the omentum. The distal omentum was excised as a separate specimen as it was bleeding from the site of separation from the mass. The left tube and ovary was sent for frozen section which revealed benign findings. Pedicles were inspected and excellent hemostasis was achieved.    The colpotomy at the vaginal cuff was closed with Vicryl on a CT1 needle in figure of 8's at the corners and v-loc in a 2 layer running manner.  Irrigation was used and excellent hemostasis was achieved.  At this point in the procedure was completed.  Robotic instruments were removed under direct visulaization.  The robot was undocked. Exparel was infiltrated into the incisions. The midline incision was closed with looped 0 PDS in a running mass closure. The 10 mm ports were closed with Vicryl on a UR-5 needle and  the fascia was closed with 0 Vicryl on a UR-5 needle.  The skin was closed with 4-0 Vicryl in a subcuticular manner.  Dermabond was applied.  Sponge, lap and needle counts correct x 2.  The patient was taken to the recovery room in stable condition.  The vagina was swabbed with  minimal bleeding noted.   All instrument and needle counts were correct x  3.   The patient was transferred to the recovery room in a stable condition.  Donaciano Eva, MD

## 2021-05-11 NOTE — Progress Notes (Signed)
Patient was made aware that her surgeon has been delayed about 1 hour.  Estimated surgery start is now 2:30 pm.  Patient voiced understanding.

## 2021-05-11 NOTE — Anesthesia Procedure Notes (Signed)
Procedure Name: Intubation Date/Time: 05/11/2021 2:33 PM Performed by: Cleda Daub, CRNA Pre-anesthesia Checklist: Patient identified, Emergency Drugs available, Suction available and Patient being monitored Patient Re-evaluated:Patient Re-evaluated prior to induction Oxygen Delivery Method: Circle system utilized Preoxygenation: Pre-oxygenation with 100% oxygen Induction Type: IV induction Ventilation: Mask ventilation without difficulty Laryngoscope Size: Mac and 4 Grade View: Grade I Tube type: Oral Tube size: 7.0 mm Number of attempts: 1 Airway Equipment and Method: Stylet and Oral airway Placement Confirmation: ETT inserted through vocal cords under direct vision, positive ETCO2 and breath sounds checked- equal and bilateral Secured at: 22 cm Tube secured with: Tape Dental Injury: Teeth and Oropharynx as per pre-operative assessment

## 2021-05-11 NOTE — Transfer of Care (Signed)
Immediate Anesthesia Transfer of Care Note  Patient: Megan Mitchell  Procedure(s) Performed: XI ROBOTIC ASSISTED TOTAL HYSTERECTOMY GREATER THAN 250 GRAMS WITH BILATERAL SALPINGO OOPHORECTOMY;MINI LAPAROTOMY FOR CYST DECOMPRESSION;OMENTECTOMY (Bilateral)  Patient Location: PACU  Anesthesia Type:General  Level of Consciousness: awake, alert , oriented and patient cooperative  Airway & Oxygen Therapy: Patient Spontanous Breathing and Patient connected to face mask oxygen  Post-op Assessment: Report given to RN and Post -op Vital signs reviewed and stable  Post vital signs: Reviewed and stable  Last Vitals:  Vitals Value Taken Time  BP    Temp    Pulse    Resp    SpO2      Last Pain:  Vitals:   05/11/21 1244  TempSrc:   PainSc: 6       Patients Stated Pain Goal: 5 (58/85/02 7741)  Complications: No notable events documented.

## 2021-05-12 ENCOUNTER — Ambulatory Visit: Payer: 59 | Admitting: Gynecologic Oncology

## 2021-05-12 ENCOUNTER — Telehealth: Payer: Self-pay

## 2021-05-12 ENCOUNTER — Encounter (HOSPITAL_COMMUNITY): Payer: Self-pay | Admitting: Gynecologic Oncology

## 2021-05-12 NOTE — Telephone Encounter (Signed)
Spoke with Megan Mitchell. She states she is eating, drinking and urinating well. She has not had a BM yet but is passing gas. Told her to taking senokot-s as prescribed and encouraged her to drink plenty of water. She denies fever or chills. Incisions are dry and intact. Her pain is controlled with ibuprofen and tylenol Told her that she can remove the honeycomb dressing on 05-16-21. She can leave the incision open to the air at that point.Pt verbalized understanding.  Instructed to call office with any fever, chills, purulent drainage, uncontrolled pain or any other questions or concerns. Patient verbalizes understanding.  Pt aware of post op appointments as well as the office number [phone] and after hours number [phone] to call if she has any questions or concerns

## 2021-05-13 ENCOUNTER — Other Ambulatory Visit (HOSPITAL_COMMUNITY): Payer: Self-pay

## 2021-05-13 ENCOUNTER — Telehealth: Payer: Self-pay

## 2021-05-13 LAB — CYTOLOGY - NON PAP

## 2021-05-13 LAB — SURGICAL PATHOLOGY

## 2021-05-13 NOTE — Telephone Encounter (Signed)
Megan Mitchell states that her shoulders and abdomen are hurting more today. Pain is a 6/10 currently.  She is alternating the tylenol with ibuprofen.  She has not wanted to take the tramadol.  Told her that she should take a tramadol to help bring her pain level down below a 5.  She can try a half a tablet and take the other half in an hour if pain remains above a 5. Her shoulders are sore.  Told her that she can use a heating pad to shoulders.  She can also apply ice to her abdomen as well. She needs to place a barrier between the ice/heating pad and her skin. She is using the abdominal binder which helps with the discomfort. She has not moved bowels. Passing a lot of gas.  Told her she can add Miralax 1 capful bid to the senokot.  Pt verbalized understanding.

## 2021-05-14 ENCOUNTER — Telehealth: Payer: Self-pay

## 2021-05-14 ENCOUNTER — Telehealth: Payer: Self-pay | Admitting: Clinical

## 2021-05-14 NOTE — Telephone Encounter (Signed)
[facility] General Follow Up Note  05/14/2021 Name: Megan Mitchell MRN: [id number] DOB: [birth year] Megan Mitchell is a 50 y.o. year old female who sees Vevelyn Francois, NP for primary care. LCSW was initially consulted to support with anxiety related to health concerns.  Interpreter: No.   Interpreter Name & Language: none  Assessment: Patient experiencing anxiety related to health concerns. She had a hysterectomy earlier this week.  Ongoing Intervention: Today CSW called patient to briefly check in. Patient reported surgery went well. Advised patient that CSW and [name] staff available for support if needed.  Review of patient status, including review of consultants reports, relevant laboratory and other test results, and collaboration with appropriate care team members and the patient's provider was performed as part of comprehensive patient evaluation and provision of services.    [name], [facility] [phone]

## 2021-05-14 NOTE — Telephone Encounter (Signed)
Told Ms Megan Mitchell that the final pathology showed no cancer per [facility]  Ms Megan Mitchell states that she has taken 2 doses of Miralax along with the 2 senokot -s tabs bid.Prune juice. No BM.    Told her to take a 1/2 bottle of mag citrate this afternoon.  Wait 3-4 hours. If no good evacuation, she can take the other half of the mag citrate. If no results and feels stool in rectum she can try a dulcolax suppository.  Call the office for further instructions if no BM after above. Pt verbalized understanding.

## 2021-05-17 ENCOUNTER — Telehealth: Payer: Self-pay

## 2021-05-17 NOTE — Telephone Encounter (Signed)
Received message from [place] wanting to know when she can remove her bandage and when she is cleared to drive.  Informed patient that she can remove her bandage and leave it open to air. Ok to get incision wet in shower, do not scrub surgical site and pat dry. Instructed if she feels able to drive safely she can do so one week after surgery as long as she is not taking any narcotics and her reaction time is returned and is able to break adequately. Patient verbalizes understanding and states she will wait a little longer and does not feel comfortable driving yet. Instructed patient to call with any questions or concerns.

## 2021-05-20 ENCOUNTER — Other Ambulatory Visit (HOSPITAL_COMMUNITY): Payer: Self-pay

## 2021-05-21 ENCOUNTER — Telehealth: Payer: Self-pay

## 2021-05-21 NOTE — Telephone Encounter (Signed)
Received phone call from [facility] stating her incision is dry, flaking, peeling and wants to know if she can put anything on it. Advised that its the skin glue used in surgery starting to come off and not to put anything on it. The glue will naturally coming off.  Patient reconfirmed her return to work date indicated on her FMLA paperwork which is 06/09/21.  Patient aware of post-op appointment on 06/01/21. She will call with any questions or concerns.

## 2021-05-24 ENCOUNTER — Telehealth: Payer: Self-pay

## 2021-05-24 NOTE — Telephone Encounter (Signed)
Per patient request I reached out to [employer] regarding patients FMLA. She is receiving notifications that her return to work date is 7/26 which is not accurate. Attempted to call Samella Parr [phone] X [phone]. She was not in today. Voicemail was left with supervisor Vernetta Honey requesting a return call. FMLA paperwork was updated and re-faxed 05/07/21 with a return to work date of 06/09/21. Notified patient of above.

## 2021-05-28 ENCOUNTER — Telehealth: Payer: Self-pay

## 2021-05-28 NOTE — Telephone Encounter (Signed)
Received call from patient stating her and her husband are going to the beach and she wants to know if she can get in the water. Advised patient to avoid submerging herself in the water until she has been cleared by Dr. Denman George. No tub baths or soaking, and no swimming. Patient verbalized understanding.

## 2021-05-28 NOTE — Telephone Encounter (Signed)
Notified Megan Mitchell that I received a phone call from Samella Parr from [place] stating her FMLA is all set with a return to work date of 06-09-21. Patient verbalized understanding.

## 2021-06-01 ENCOUNTER — Inpatient Hospital Stay: Payer: 59 | Attending: Gynecologic Oncology | Admitting: Gynecologic Oncology

## 2021-06-01 ENCOUNTER — Other Ambulatory Visit: Payer: Self-pay

## 2021-06-01 VITALS — BP 105/61 | HR 82 | Temp 98.6°F | Resp 18 | Ht 68.0 in | Wt 237.4 lb

## 2021-06-01 DIAGNOSIS — Z90710 Acquired absence of both cervix and uterus: Secondary | ICD-10-CM | POA: Insufficient documentation

## 2021-06-01 DIAGNOSIS — Z90722 Acquired absence of ovaries, bilateral: Secondary | ICD-10-CM | POA: Insufficient documentation

## 2021-06-01 DIAGNOSIS — D251 Intramural leiomyoma of uterus: Secondary | ICD-10-CM | POA: Insufficient documentation

## 2021-06-01 DIAGNOSIS — R1900 Intra-abdominal and pelvic swelling, mass and lump, unspecified site: Secondary | ICD-10-CM

## 2021-06-01 NOTE — Patient Instructions (Signed)
Dr Denman George is recommending no sex for another 1 month. She is recommending no heavy lifting for another 1 week.  It is safe for you to return to work on the 10th of August.  You can swim (submerge) in 1 week.  Please call her office with any questions about your surgery.

## 2021-06-01 NOTE — Progress Notes (Signed)
Follow-up Note: Gyn-Onc  Consult was requested by Earnstine Regal, NP for the evaluation of Megan Mitchell 50 y.o. female  CC:  Pelvic mass.   Assessment/Plan:  Megan Mitchell  is a 50 y.o.  year old with a history of a 30 cm cystic pelvic and abdominal mass arising from the left ovary, fibroid uterus, mildly elevated Ca1 25. She is status post robotic assisted total hysterectomy for uterus greater than 250 g, BSO, mini laparotomy.  She is doing well postoperatively with no complaints.  Her pathology was benign.  She does not require additional therapy.  She is discharged to the care of her primary care provider and OB/GYN. Marland Kitchen  HPI: Megan Mitchell is a 50 year old P2 who was seen in consultation at the request of Earnstine Regal, NP for evaluation of a 30 cm pelvic mass.  The patient began experiencing abdominal distention and bloating symptoms in December 2021.  She attempted to be seen by her primary care doctor at that time however due to [place] pandemic precautions and difficulty making an appointment she was not able to make 1.  Her PCP then left the practice and she had to reestablish care with another PCP.  This was taking some time and therefore she established care with an OB/GYN provider, Earnstine Regal, NP.  Megan Mitchell saw the patient on 01/26/21 and ordered a pelvic ultrasound scan performed ton 04/01/21 for a known history of fibroids.  The ultrasound scan identified a uterus measuring 8.6 x 4.2 x 9.2 cm.  There were multiple intramural, submucosal, and subserosal fibroids within.  The left ovary appeared to measure 2.8 x 2 x 2.2 cm.  The right ovary was not clearly seen.  A cystic structure was seen extending from the right adnexa into the abdomen and adjacent to the liver.  It measured at least 26 cm in greatest dimension on ultrasound.  To follow-up this ultrasound finding an MRI of the pelvis and abdomen was performed on 04/05/2021.  This revealed a 30 x 18.8 x 28 cm cystic mass in the  abdomen and pelvis anteriorly.  Somewhat septated complex lobulations along the inferior margin.  There was a solid component posteriorly and inferiorly measuring 5.6 cm.  There was no apparent ascites or adenopathy or peritoneal disease.  The uterus measured 12 x 11 x 11 cm on MRI dimensions.  It contained fibroids.  A Ca1 25 was drawn on 6-22 and was mildly elevated at 67.  Interval Hx:  She was scheduled for surgery in June, but cancelled due to a gastrointestinal illness.   On 05/11/2021 she underwent mini laparotomy for cyst decompression followed by robotic assisted total hysterectomy with BSO. Intraoperative findings were significant for 30 cm cystic mass with a solid area approximately 10 cm at its base, densely adherent to the anterior abdominal wall and omentum.  The cystic mass was arising from the left ovary.  There was a fibroid uterus with a bulky posterior lower uterine fibroid measuring approximately 16 cm in total uterine dimension.  The right tube and ovary were grossly normal.  There is normal upper abdomen and omentum with the exception of the portion that was adherent to the ovarian cyst. Surgery was uncomplicated.  Frozen section was benign.  Final pathology revealed a benign fibroma of the ovary with a benign hemorrhagic cyst measuring 27 cm.  No malignancy.  The uterus included benign fibroids.  A portion of omentum that had been removed was also benign.  Since surgery she has done well with no major complaints.   Current Meds:  Outpatient Encounter Medications as of 04/08/2021  Medication Sig   acetaminophen (TYLENOL) 500 MG tablet Take 500 mg by mouth every 6 (six) hours as needed.   amLODipine (NORVASC) 10 MG tablet TAKE 1 TABLET BY MOUTH DAILY.   losartan (COZAAR) 50 MG tablet TAKE 1 TABLET BY MOUTH ONCE DAILY   Probiotic Product (PROBIOTIC PO) Take by mouth.   sertraline (ZOLOFT) 100 MG tablet TAKE 1 TABLET BY MOUTH DAILY.   triamterene-hydrochlorothiazide (MAXZIDE-25)  37.5-25 MG tablet TAKE 1 TABLET BY MOUTH DAILY.   diclofenac (CATAFLAM) 50 MG tablet Take 2 tablets by mouth initially, then 1 tablet after a meal twice daily as needed for pain. (Patient not taking: Reported on 04/07/2021)   HYDROcodone-acetaminophen (NORCO/VICODIN) 5-325 MG tablet Take 1 tablet by mouth every 6 hours as needed for breakthrough pain (Patient not taking: Reported on 04/07/2021)   Multiple Vitamin (MULTIVITAMIN) tablet Take 1 tablet by mouth daily. (Patient not taking: Reported on 04/07/2021)   [DISCONTINUED] pantoprazole (PROTONIX) 40 MG tablet TAKE 1 TABLET (40 MG TOTAL) BY MOUTH DAILY.   [DISCONTINUED] potassium chloride SA (KLOR-CON) 20 MEQ tablet Take 1 tablet (20 mEq total) by mouth daily. (Patient not taking: Reported on 01/19/2021)   No facility-administered encounter medications on file as of 04/08/2021.    Allergy:  Allergies  Allergen Reactions   Sulfa Antibiotics Rash    Makes mouth break out with a rash    Social Hx:   Social History   Socioeconomic History   Marital status: Married    Spouse name: Not on file   Number of children: 2   Years of education: Not on file   Highest education level: Not on file  Occupational History   Not on file  Tobacco Use   Smoking status: Never   Smokeless tobacco: Never  Vaping Use   Vaping Use: Never used  Substance and Sexual Activity   Alcohol use: No   Drug use: No   Sexual activity: Yes    Birth control/protection: Surgical  Other Topics Concern   Not on file  Social History Narrative   Not on file   Social Determinants of Health   Financial Resource Strain: Not on file  Food Insecurity: Not on file  Transportation Needs: Not on file  Physical Activity: Not on file  Stress: Not on file  Social Connections: Not on file  Intimate Partner Violence: Not on file    Past Surgical Hx:  Past Surgical History:  Procedure Laterality Date   CHOLECYSTECTOMY     COLONOSCOPY     ELBOW SURGERY     ROBOTIC ASSISTED  TOTAL HYSTERECTOMY WITH BILATERAL SALPINGO OOPHERECTOMY Bilateral 05/11/2021   Procedure: XI ROBOTIC ASSISTED TOTAL HYSTERECTOMY GREATER THAN 250 GRAMS WITH BILATERAL SALPINGO OOPHORECTOMY;MINI LAPAROTOMY FOR CYST DECOMPRESSION;OMENTECTOMY;  Surgeon: Everitt Amber, MD;  Location: [facility];  Service: Gynecology;  Laterality: Bilateral;   TUBAL LIGATION      Past Medical Hx:  Past Medical History:  Diagnosis Date   Allergy    seasonal   Anemia    Anxiety    Arthritis    left knee   Fibroids    Hypertension    Ovarian cyst     Past Gynecological History: SVD x2, pap normal on 01/26/21 with negative high risk HPV No LMP recorded. (Menstrual status: Perimenopausal).  Family Hx:  Family History  Problem Relation Age of Onset   Hypertension  Mother    Colon cancer Neg Hx    Colon polyps Neg Hx    Esophageal cancer Neg Hx    Rectal cancer Neg Hx    Stomach cancer Neg Hx     Review of Systems:  Constitutional  Feels well,    ENT Normal appearing ears and nares bilaterally Skin/Breast  No rash, sores, jaundice, itching, dryness Cardiovascular  No chest pain, shortness of breath, or edema  Pulmonary  No cough or wheeze.  Gastro Intestinal  No nausea, vomitting, or diarrhoea. No bright red blood per rectum, no abdominal pain, change in bowel movement, or constipation.  Genito Urinary  No frequency, urgency, dysuria, no bleeding Musculo Skeletal  No myalgia, arthralgia, joint swelling or pain  Neurologic  No weakness, numbness, change in gait,  Psychology  No depression, anxiety, insomnia.   Vitals:  Blood pressure 105/61, pulse 82, temperature 98.6 F (37 C), temperature source Oral, resp. rate 18, height '5\' 8"'$  (1.727 m), weight 237 lb 6 oz (107.7 kg), SpO2 100 %.  Physical Exam: WD in NAD Neck  Supple NROM, without any enlargements.  Skin  No rash/lesions/breakdown  Psychiatry  Alert and oriented to person, place, and time  Abdomen  Normoactive bowel sounds, abdomen  non-tender and obese without evidence of hernia.  Well healed incisions. Back No CVA tenderness Genito Urinary  Vulva/vagina: vaginal cuff in tact, no bleeding. ceferred Extremities  No bilateral cyanosis, clubbing or edema.   Thereasa Solo, MD  06/01/2021, 6:12 PM

## 2021-06-14 ENCOUNTER — Other Ambulatory Visit: Payer: Self-pay

## 2021-06-14 ENCOUNTER — Ambulatory Visit: Payer: 59 | Admitting: Family Medicine

## 2021-06-14 ENCOUNTER — Encounter: Payer: Self-pay | Admitting: Family Medicine

## 2021-06-14 DIAGNOSIS — M545 Low back pain, unspecified: Secondary | ICD-10-CM

## 2021-06-14 DIAGNOSIS — M1712 Unilateral primary osteoarthritis, left knee: Secondary | ICD-10-CM

## 2021-06-14 NOTE — Progress Notes (Signed)
Office Visit Note   Patient: Megan Mitchell           Date of Birth: 1971-05-17           MRN: [id number] Visit Date: 06/14/2021 Requested by: Vevelyn Francois, NP [address] [place],  [place] [address] PCP: Vevelyn Francois, NP  Subjective: Chief Complaint  Patient presents with   Lower Back - Pain    Pain in the lower back bilaterally, since around October of last year. She did just have a surgery last month, to remove a very large ovarian cyst (along with complete hysterectomy). She wonders if that has affected her back, the way she was walking with that cyst (24 lbs of fluid in that cyst).    Left Knee - Pain    Knee is not hurting as badly as before, but the joint does not feel stable.     HPI: She is here with low back pain and left knee pain.  Her knee started bothering her again a couple months ago.  She did well with Durolane injection in January.  It is feeling slightly swollen and slightly unsteady.  Her back has been hurting, no definite injury.  She had a large pelvic cyst removed not long ago.  She thinks her back might of been hurting from carrying the cyst for a while.  No radicular pain.  She had x-rays in 2019 showing lumbar facet arthropathy.                ROS: Denies sciatica, denies bowel or bladder dysfunction.  All other systems were reviewed and are negative.  Objective: Vital Signs: There were no vitals taken for this visit.  Physical Exam:  General:  Alert and oriented, in no acute distress. Pulm:  Breathing unlabored. Psy:  Normal mood, congruent affect. Skin: No rash Low back: She is tender lateral to the lumbar paraspinous muscles, just above the iliac crest.  No pain in the sciatic notch.  Lower extremity strength and reflexes are normal. Left knee: 1+ effusion with no warmth.  Ligaments feel stable.  Slightly tender on the medial joint line.    Imaging: No results found.  Assessment & Plan: Recurrent left knee pain with underlying  osteoarthritis -Durolane injection today.  Follow-up as needed.  2.  Low back pain with history of lumbar facet arthropathy - Home exercises given.  MRI scan if symptoms worsen.     Procedures: Left knee injection: After sterile prep with Betadine, injected 5 cc 1% lidocaine without epinephrine and Durolane from lateral midpatellar approach.  A flash of synovial fluid was obtained prior to injection.       PMFS History: Patient Active Problem List   Diagnosis Date Noted   Urinary tract infection 05/11/2021   Pelvic mass in female 04/08/2021   Fibroids 04/08/2021   Anemia 12/26/2014   HTN (hypertension) 03/22/2014   Severe obesity (BMI >= 40) ([place]) 03/22/2014   Past Medical History:  Diagnosis Date   Allergy    seasonal   Anemia    Anxiety    Arthritis    left knee   Fibroids    Hypertension    Ovarian cyst     Family History  Problem Relation Age of Onset   Hypertension Mother    Colon cancer Neg Hx    Colon polyps Neg Hx    Esophageal cancer Neg Hx    Rectal cancer Neg Hx    Stomach cancer Neg  Hx     Past Surgical History:  Procedure Laterality Date   CHOLECYSTECTOMY     COLONOSCOPY     ELBOW SURGERY     ROBOTIC ASSISTED TOTAL HYSTERECTOMY WITH BILATERAL SALPINGO OOPHERECTOMY Bilateral 05/11/2021   Procedure: XI ROBOTIC ASSISTED TOTAL HYSTERECTOMY GREATER THAN 250 GRAMS WITH BILATERAL SALPINGO OOPHORECTOMY;MINI LAPAROTOMY FOR CYST DECOMPRESSION;OMENTECTOMY;  Surgeon: Everitt Amber, MD;  Location: [facility];  Service: Gynecology;  Laterality: Bilateral;   TUBAL LIGATION     Social History   Occupational History   Not on file  Tobacco Use   Smoking status: Never   Smokeless tobacco: Never  Vaping Use   Vaping Use: Never used  Substance and Sexual Activity   Alcohol use: No   Drug use: No   Sexual activity: Yes    Birth control/protection: Surgical

## 2021-06-15 ENCOUNTER — Telehealth: Payer: Self-pay

## 2021-06-15 ENCOUNTER — Telehealth: Payer: Self-pay | Admitting: Family Medicine

## 2021-06-15 ENCOUNTER — Other Ambulatory Visit (HOSPITAL_COMMUNITY): Payer: Self-pay

## 2021-06-15 MED ORDER — NABUMETONE 500 MG PO TABS
500.0000 mg | ORAL_TABLET | Freq: Two times a day (BID) | ORAL | 3 refills | Status: DC | PRN
  Filled 2021-06-15: qty 60, 30d supply, fill #0
  Filled 2021-07-08: qty 60, 30d supply, fill #1

## 2021-06-15 NOTE — Telephone Encounter (Signed)
VOB has been submitted for Durolane , left knee. Pending BV

## 2021-06-15 NOTE — Telephone Encounter (Signed)
Please advise 

## 2021-06-15 NOTE — Telephone Encounter (Signed)
I called and advised the patient. 

## 2021-06-15 NOTE — Telephone Encounter (Signed)
Patient called. Says Dr. Junius Roads did not call in her medication. Pain medication and something for inflammation. Her call back number is [phone]

## 2021-06-17 ENCOUNTER — Telehealth: Payer: Self-pay

## 2021-06-17 NOTE — Telephone Encounter (Signed)
Told ms Betley that Dr. Denman George recommended no sexual activity  for another month on 06-01-21.  Pt wanted a specific date to use as a guide.  Told her it would be fine to resume intercourse on on 07-01-21. Pt verbalized understanding.

## 2021-06-23 ENCOUNTER — Telehealth: Payer: Self-pay

## 2021-06-23 NOTE — Telephone Encounter (Signed)
Approved for Durolane, left knee. Buy & Bill Covered at 100% of the allowable amount. Co-pay of $50.00 No PA required  Patient had Durolane injection on 06/14/2021.

## 2021-07-02 DIAGNOSIS — H5213 Myopia, bilateral: Secondary | ICD-10-CM | POA: Diagnosis not present

## 2021-07-08 ENCOUNTER — Other Ambulatory Visit (HOSPITAL_COMMUNITY): Payer: Self-pay

## 2021-07-09 ENCOUNTER — Other Ambulatory Visit (HOSPITAL_COMMUNITY): Payer: Self-pay

## 2021-07-26 ENCOUNTER — Other Ambulatory Visit (HOSPITAL_COMMUNITY): Payer: Self-pay

## 2021-07-26 MED FILL — Losartan Potassium Tab 50 MG: ORAL | 90 days supply | Qty: 90 | Fill #1 | Status: AC

## 2021-08-02 ENCOUNTER — Other Ambulatory Visit (HOSPITAL_COMMUNITY): Payer: Self-pay

## 2021-08-02 MED FILL — Triamterene & Hydrochlorothiazide Tab 37.5-25 MG: ORAL | 90 days supply | Qty: 90 | Fill #2 | Status: AC

## 2021-08-13 ENCOUNTER — Other Ambulatory Visit (HOSPITAL_COMMUNITY): Payer: Self-pay

## 2021-08-13 MED FILL — Amlodipine Besylate Tab 10 MG (Base Equivalent): ORAL | 90 days supply | Qty: 90 | Fill #2 | Status: AC

## 2021-08-17 ENCOUNTER — Telehealth: Payer: Self-pay

## 2021-08-17 NOTE — Telephone Encounter (Signed)
Megan Mitchell states that she has been experiencing some intermittent abdominal pain along her mini ;ap incision in the last few weeks. Surgery was 05-11-21.  She states that the discomfort pain in in the evening, especially after a busy day.  Reviewed with Megan John, NP. Megan Mitchell recommends Megan Mitchell use a support undergarment during the day to help decrease the abdominal discomfort.  She can slso use tylenol and ibuprofen prn. Pt verbalized understanding.

## 2021-09-13 ENCOUNTER — Encounter: Payer: Self-pay | Admitting: Nurse Practitioner

## 2021-09-13 ENCOUNTER — Other Ambulatory Visit (HOSPITAL_COMMUNITY): Payer: Self-pay

## 2021-09-13 ENCOUNTER — Other Ambulatory Visit: Payer: Self-pay

## 2021-09-13 ENCOUNTER — Ambulatory Visit: Payer: 59 | Admitting: Nurse Practitioner

## 2021-09-13 VITALS — BP 114/73 | HR 65 | Temp 97.3°F | Ht 67.0 in | Wt 263.0 lb

## 2021-09-13 DIAGNOSIS — D649 Anemia, unspecified: Secondary | ICD-10-CM

## 2021-09-13 DIAGNOSIS — N390 Urinary tract infection, site not specified: Secondary | ICD-10-CM

## 2021-09-13 DIAGNOSIS — I10 Essential (primary) hypertension: Secondary | ICD-10-CM | POA: Diagnosis not present

## 2021-09-13 LAB — POCT URINALYSIS DIP (CLINITEK)
Bilirubin, UA: NEGATIVE
Blood, UA: NEGATIVE
Glucose, UA: NEGATIVE mg/dL
Ketones, POC UA: NEGATIVE mg/dL
Leukocytes, UA: NEGATIVE
Nitrite, UA: NEGATIVE
POC PROTEIN,UA: NEGATIVE
Spec Grav, UA: >=1.03 — AB (ref 1.010–1.025)
Urobilinogen, UA: 0.2 E.U./dL
pH, UA: 6 (ref 5.0–8.0)

## 2021-09-13 MED ORDER — LOSARTAN POTASSIUM 50 MG PO TABS
ORAL_TABLET | Freq: Every day | ORAL | 3 refills | Status: DC
  Filled 2021-09-13: qty 90, fill #0
  Filled 2021-10-24: qty 90, 90d supply, fill #0
  Filled 2022-01-20: qty 90, 90d supply, fill #1
  Filled 2022-04-19: qty 90, 90d supply, fill #2
  Filled 2022-07-18: qty 90, 90d supply, fill #3

## 2021-09-13 MED ORDER — PHENTERMINE HCL 37.5 MG PO CAPS
37.5000 mg | ORAL_CAPSULE | ORAL | 0 refills | Status: DC
  Filled 2021-09-13: qty 30, 30d supply, fill #0

## 2021-09-13 MED ORDER — AMLODIPINE BESYLATE 10 MG PO TABS
ORAL_TABLET | Freq: Every day | ORAL | 3 refills | Status: DC
  Filled 2021-09-13: qty 90, fill #0
  Filled 2021-11-12: qty 90, 90d supply, fill #0
  Filled 2022-02-16: qty 90, 90d supply, fill #1
  Filled 2022-05-18: qty 90, 90d supply, fill #2
  Filled 2022-07-28: qty 90, 90d supply, fill #3

## 2021-09-13 MED ORDER — TRIAMTERENE-HCTZ 37.5-25 MG PO TABS
1.0000 | ORAL_TABLET | Freq: Every day | ORAL | 3 refills | Status: DC
  Filled 2021-09-13 – 2021-10-24 (×2): qty 90, 90d supply, fill #0
  Filled 2022-01-20: qty 90, 90d supply, fill #1
  Filled 2022-04-29: qty 90, 90d supply, fill #2
  Filled 2022-07-28: qty 90, 90d supply, fill #3

## 2021-09-13 NOTE — Progress Notes (Signed)
[facility] [place], [place]  [address] Phone:  [phone]   Fax:  [phone]   Established Patient Office Visit  Subjective:  Patient ID: Megan Mitchell, female    DOB: 1971-03-15  Age: 50 y.o. MRN: [id number]  CC:  Chief Complaint  Patient presents with   Follow-up    Possible UTI, medication refills. Patient stated she had a hysterectomy and is experiencing hot flashes and weight gain. Requesting something to maybe suppress appetite.     HPI Katura C Brines presents for follow up. She  has a past medical history of Allergy, Anemia, Anxiety, Arthritis, Fibroids, Hypertension, and Ovarian cyst.   She is status post total hysterectomy. She reports that she had lost weight with her surgery. She endorses having gone into full menopause. She does not feel like she is eating anymore. She is on weight watchers. She feels like the weight is slowly coming back on. She is requesting an appetite suppressant.  Denies headache, dizziness, visual changes, shortness of breath, dyspnea on exertion, chest pain, nausea, vomiting or any edema.   Past Medical History:  Diagnosis Date   Allergy    seasonal   Anemia    Anxiety    Arthritis    left knee   Fibroids    Hypertension    Ovarian cyst     Past Surgical History:  Procedure Laterality Date   CHOLECYSTECTOMY     COLONOSCOPY     ELBOW SURGERY     ROBOTIC ASSISTED TOTAL HYSTERECTOMY WITH BILATERAL SALPINGO OOPHERECTOMY Bilateral 05/11/2021   Procedure: XI ROBOTIC ASSISTED TOTAL HYSTERECTOMY GREATER THAN 250 GRAMS WITH BILATERAL SALPINGO OOPHORECTOMY;MINI LAPAROTOMY FOR CYST DECOMPRESSION;OMENTECTOMY;  Surgeon: Everitt Amber, MD;  Location: [facility];  Service: Gynecology;  Laterality: Bilateral;   TUBAL LIGATION      Family History  Problem Relation Age of Onset   Hypertension Mother    Colon cancer Neg Hx    Colon polyps Neg Hx    Esophageal cancer Neg Hx    Rectal cancer Neg Hx    Stomach  cancer Neg Hx     Social History   Socioeconomic History   Marital status: Married    Spouse name: Not on file   Number of children: 2   Years of education: Not on file   Highest education level: Not on file  Occupational History   Not on file  Tobacco Use   Smoking status: Never   Smokeless tobacco: Never  Vaping Use   Vaping Use: Never used  Substance and Sexual Activity   Alcohol use: No   Drug use: No   Sexual activity: Yes    Birth control/protection: Surgical  Other Topics Concern   Not on file  Social History Narrative   Not on file   Social Determinants of Health   Financial Resource Strain: Not on file  Food Insecurity: Not on file  Transportation Needs: Not on file  Physical Activity: Not on file  Stress: Not on file  Social Connections: Not on file  Intimate Partner Violence: Not on file    Outpatient Medications Prior to Visit  Medication Sig Dispense Refill   acetaminophen (TYLENOL) 500 MG tablet Take 1,000 mg by mouth every 6 (six) hours as needed for moderate pain.     Ginger, Zingiber officinalis, (GINGER PO) Take by mouth.     ibuprofen (ADVIL) 800 MG tablet Take 1 tablet (800 mg total) by mouth every 8 (eight)  hours as needed for moderate pain. For AFTER surgery only 30 tablet 0   Rhubarb (ESTROVERA PO) Take by mouth. Take one daily.     amLODipine (NORVASC) 10 MG tablet TAKE 1 TABLET BY MOUTH DAILY. (Patient taking differently: Take 10 mg by mouth daily.) 90 tablet 3   losartan (COZAAR) 50 MG tablet TAKE 1 TABLET BY MOUTH ONCE DAILY (Patient taking differently: Take 50 mg by mouth daily.) 90 tablet 3   triamterene-hydrochlorothiazide (MAXZIDE-25) 37.5-25 MG tablet TAKE 1 TABLET BY MOUTH DAILY. 90 tablet 3   metaxalone (SKELAXIN) 800 MG tablet Take 1 tablet 2-3 times daily for back (Patient not taking: Reported on 09/13/2021) 60 tablet 1   nabumetone (RELAFEN) 500 MG tablet Take 1 tablet (500 mg total) by mouth 2 (two) times daily as needed. (Patient  not taking: Reported on 09/13/2021) 60 tablet 3   ondansetron (ZOFRAN-ODT) 4 MG disintegrating tablet Dissolve 1 tablet (4 mg total) by mouth every 8 (eight) hours as needed for nausea or vomiting. 2 tablet 0   senna-docusate (SENOKOT-S) 8.6-50 MG tablet Take 2 tablets by mouth at bedtime. For AFTER surgery, do not take if having diarrhea 30 tablet 0   traMADol (ULTRAM) 50 MG tablet Take 1 tablet (50 mg total) by mouth every 6 (six) hours as needed for severe pain. For AFTER surgery only, do not take and drive 10 tablet 0   No facility-administered medications prior to visit.    Allergies  Allergen Reactions   Sulfa Antibiotics Rash    Makes mouth break out with a rash    ROS Review of Systems    Objective:    Physical Exam Constitutional:      Appearance: She is obese.  HENT:     Head: Normocephalic and atraumatic.     Nose: Nose normal.     Mouth/Throat:     Mouth: Mucous membranes are moist.  Cardiovascular:     Rate and Rhythm: Normal rate and regular rhythm.     Pulses: Normal pulses.     Heart sounds: Normal heart sounds.  Pulmonary:     Effort: Pulmonary effort is normal.     Breath sounds: Normal breath sounds.  Musculoskeletal:        General: Normal range of motion.     Cervical back: Normal range of motion.     Right lower leg: No edema.     Left lower leg: No edema.  Skin:    General: Skin is warm and dry.     Capillary Refill: Capillary refill takes less than 2 seconds.  Neurological:     General: No focal deficit present.     Mental Status: She is alert and oriented to person, place, and time.  Psychiatric:        Mood and Affect: Mood normal.        Behavior: Behavior normal.        Thought Content: Thought content normal.        Judgment: Judgment normal.    BP 114/73   Pulse 65   Temp (!) 97.3 F (36.3 C)   Ht 5\' 7"  (1.702 m)   Wt 263 lb 0.6 oz (119.3 kg)   SpO2 100%   BMI 41.20 kg/m  Wt Readings from Last 3 Encounters:  09/13/21 263 lb  0.6 oz (119.3 kg)  06/01/21 237 lb 6 oz (107.7 kg)  05/11/21 240 lb 1.3 oz (108.9 kg)     Health Maintenance Due  Topic Date Due  INFLUENZA VACCINE  05/31/2021    There are no preventive care reminders to display for this patient.  Lab Results  Component Value Date   TSH 1.680 03/24/2020   Lab Results  Component Value Date   WBC 9.9 05/11/2021   HGB 10.2 (L) 05/11/2021   HCT 33.5 (L) 05/11/2021   MCV 77.2 (L) 05/11/2021   PLT 760 (H) 05/11/2021   Lab Results  Component Value Date   NA 137 05/11/2021   K 3.5 05/11/2021   CO2 23 05/11/2021   GLUCOSE 110 (H) 05/11/2021   BUN 18 05/11/2021   CREATININE 1.12 (H) 05/11/2021   BILITOT 0.7 05/04/2021   ALKPHOS 59 05/04/2021   AST 12 (L) 05/04/2021   ALT 9 05/04/2021   PROT 8.5 (H) 05/04/2021   ALBUMIN 3.6 05/04/2021   CALCIUM 10.0 05/11/2021   ANIONGAP 13 05/11/2021   Lab Results  Component Value Date   CHOL 204 (H) 03/24/2020   Lab Results  Component Value Date   HDL 67 03/24/2020   Lab Results  Component Value Date   LDLCALC 125 (H) 03/24/2020   Lab Results  Component Value Date   TRIG 68 03/24/2020   Lab Results  Component Value Date   CHOLHDL 3.0 03/24/2020   Lab Results  Component Value Date   HGBA1C 5.5 11/04/2020      Assessment & Plan:   Problem List Items Addressed This Visit       Genitourinary   Urinary tract infection   Relevant Orders   POCT URINALYSIS DIP (CLINITEK)      Other   Severe obesity (BMI >= 40) ([facility]) Started phentermine 37.5 mg daily Education provided  3 mo tx option  4 week follow up Obesity with BMI and comorbidities as noted above.  Discussed proper diet (low fat, low sodium, high fiber) with patient.   Discussed need for regular exercise (3 times per week, 20 minutes per session) with patient.    Relevant Medications   phentermine 37.5 MG capsule   Anemia Related to previous surgery Reevaluation pending   Relevant Orders   CBC with  Differential/Platelet    Other Visit Diagnoses     Essential hypertension    -  Primary Stable Encouraged on going compliance with current medication regimen Encouraged home monitoring and recording BP <130/80 Eating a heart-healthy diet with less salt Encouraged regular physical activity  Recommend Weight loss     Relevant Medications   amLODipine (NORVASC) 10 MG tablet   losartan (COZAAR) 50 MG tablet   triamterene-hydrochlorothiazide (MAXZIDE-25) 37.5-25 MG tablet   Other Relevant Orders   Comp. Metabolic Panel (12) (Completed)       Meds ordered this encounter  Medications   amLODipine (NORVASC) 10 MG tablet    Sig: TAKE 1 TABLET BY MOUTH DAILY.    Dispense:  90 tablet    Refill:  3   losartan (COZAAR) 50 MG tablet    Sig: TAKE 1 TABLET BY MOUTH ONCE DAILY    Dispense:  90 tablet    Refill:  3   triamterene-hydrochlorothiazide (MAXZIDE-25) 37.5-25 MG tablet    Sig: TAKE 1 TABLET BY MOUTH DAILY.    Dispense:  90 tablet    Refill:  3   phentermine 37.5 MG capsule    Sig: Take 1 capsule (37.5 mg total) by mouth every morning.    Dispense:  30 capsule    Refill:  0    Order Specific Question:   Supervising Provider  AnswerTresa Garter [[id number]]    Follow-up: Return in about 4 weeks (around 10/11/2021) for follow up medication management [id number].    Vevelyn Francois, NP

## 2021-09-13 NOTE — Patient Instructions (Addendum)
Healthy Eating Following a healthy eating pattern may help you to achieve and maintain a healthy body weight, reduce the risk of chronic disease, and live a long and productive life. It is important to follow a healthy eating pattern at an appropriate calorie level for your body. Your nutritional needs should be met primarily through food by choosing a variety of nutrient-rich foods. What are tips for following this plan? Reading food labels Read labels and choose the following: Reduced or low sodium. Juices with 100% fruit juice. Foods with low saturated fats and high polyunsaturated and monounsaturated fats. Foods with whole grains, such as whole wheat, cracked wheat, brown rice, and wild rice. Whole grains that are fortified with folic acid. This is recommended for women who are pregnant or who want to become pregnant. Read labels and avoid the following: Foods with a lot of added sugars. These include foods that contain brown sugar, corn sweetener, corn syrup, dextrose, fructose, glucose, high-fructose corn syrup, honey, invert sugar, lactose, malt syrup, maltose, molasses, raw sugar, sucrose, trehalose, or turbinado sugar. Do not eat more than the following amounts of added sugar per day: 6 teaspoons (25 g) for women. 9 teaspoons (38 g) for men. Foods that contain processed or refined starches and grains. Refined grain products, such as white flour, degermed cornmeal, white bread, and white rice. Shopping Choose nutrient-rich snacks, such as vegetables, whole fruits, and nuts. Avoid high-calorie and high-sugar snacks, such as potato chips, fruit snacks, and candy. Use oil-based dressings and spreads on foods instead of solid fats such as butter, stick margarine, or cream cheese. Limit pre-made sauces, mixes, and "instant" products such as flavored rice, instant noodles, and ready-made pasta. Try more plant-protein sources, such as tofu, tempeh, black beans, edamame, lentils, nuts, and  seeds. Explore eating plans such as the Mediterranean diet or vegetarian diet. Cooking Use oil to saut or stir-fry foods instead of solid fats such as butter, stick margarine, or lard. Try baking, boiling, grilling, or broiling instead of frying. Remove the fatty part of meats before cooking. Steam vegetables in water or broth. Meal planning  At meals, imagine dividing your plate into fourths: One-half of your plate is fruits and vegetables. One-fourth of your plate is whole grains. One-fourth of your plate is protein, especially lean meats, poultry, eggs, tofu, beans, or nuts. Include low-fat dairy as part of your daily diet. Lifestyle Choose healthy options in all settings, including home, work, school, restaurants, or stores. Prepare your food safely: Wash your hands after handling raw meats. Keep food preparation surfaces clean by regularly washing with hot, soapy water. Keep raw meats separate from ready-to-eat foods, such as fruits and vegetables. Cook seafood, meat, poultry, and eggs to the recommended internal temperature. Store foods at safe temperatures. In general: Keep cold foods at 40F (4.4C) or below. Keep hot foods at 140F (60C) or above. Keep your freezer at 0F (-17.8C) or below. Foods are no longer safe to eat when they have been between the temperatures of 40-140F (4.4-60C) for more than 2 hours. What foods should I eat? Fruits Aim to eat 2 cup-equivalents of fresh, canned (in natural juice), or frozen fruits each day. Examples of 1 cup-equivalent of fruit include 1 small apple, 8 large strawberries, 1 cup canned fruit,  cup dried fruit, or 1 cup 100% juice. Vegetables Aim to eat 2-3 cup-equivalents of fresh and frozen vegetables each day, including different varieties and colors. Examples of 1 cup-equivalent of vegetables include 2 medium carrots, 2 cups raw,   leafy greens, 1 cup chopped vegetable (raw or cooked), or 1 medium baked potato. Grains Aim to  eat 6 ounce-equivalents of whole grains each day. Examples of 1 ounce-equivalent of grains include 1 slice of bread, 1 cup ready-to-eat cereal, 3 cups popcorn, or  cup cooked rice, pasta, or cereal. Meats and other proteins Aim to eat 5-6 ounce-equivalents of protein each day. Examples of 1 ounce-equivalent of protein include 1 egg, 1/2 cup nuts or seeds, or 1 tablespoon (16 g) peanut butter. A cut of meat or fish that is the size of a deck of cards is about 3-4 ounce-equivalents. Of the protein you eat each week, try to have at least 8 ounces come from seafood. This includes salmon, trout, herring, and anchovies. Dairy Aim to eat 3 cup-equivalents of fat-free or low-fat dairy each day. Examples of 1 cup-equivalent of dairy include 1 cup (240 mL) milk, 8 ounces (250 g) yogurt, 1 ounces (44 g) natural cheese, or 1 cup (240 mL) fortified soy milk. Fats and oils Aim for about 5 teaspoons (21 g) per day. Choose monounsaturated fats, such as canola and olive oils, avocados, peanut butter, and most nuts, or polyunsaturated fats, such as sunflower, corn, and soybean oils, walnuts, pine nuts, sesame seeds, sunflower seeds, and flaxseed. Beverages Aim for six 8-oz glasses of water per day. Limit coffee to three to five 8-oz cups per day. Limit caffeinated beverages that have added calories, such as soda and energy drinks. Limit alcohol intake to no more than 1 drink a day for nonpregnant women and 2 drinks a day for men. One drink equals 12 oz of beer (355 mL), 5 oz of wine (148 mL), or 1 oz of hard liquor (44 mL). Seasoning and other foods Avoid adding excess amounts of salt to your foods. Try flavoring foods with herbs and spices instead of salt. Avoid adding sugar to foods. Try using oil-based dressings, sauces, and spreads instead of solid fats. This information is based on general [place] nutrition guidelines. For more information, visit BuildDNA.es. Exact amounts may vary based on your nutrition  needs. Summary A healthy eating plan may help you to maintain a healthy weight, reduce the risk of chronic diseases, and stay active throughout your life. Plan your meals. Make sure you eat the right portions of a variety of nutrient-rich foods. Try baking, boiling, grilling, or broiling instead of frying. Choose healthy options in all settings, including home, work, school, restaurants, or stores. This information is not intended to replace advice given to you by your health care provider. Make sure you discuss any questions you have with your health care provider. Document Revised: 06/15/2021 Document Reviewed: 06/15/2021 Elsevier Patient Education  [place] [employer] Calories are units of energy. Your body needs a certain number of calories from food to keep going throughout the day. When you eat or drink more calories than your body needs, your body stores the extra calories mostly as fat. When you eat or drink fewer calories than your body needs, your body burns fat to get the energy it needs. Calorie counting means keeping track of how many calories you eat and drink each day. Calorie counting can be helpful if you need to lose weight. If you eat fewer calories than your body needs, you should lose weight. Ask your health care provider what a healthy weight is for you. For calorie counting to work, you will need to eat the right number of calories each day  to lose a healthy amount of weight per week. A dietitian can help you figure out how many calories you need in a day and will suggest ways to reach your calorie goal. A healthy amount of weight to lose each week is usually 1-2 lb (0.5-0.9 kg). This usually means that your daily calorie intake should be reduced by 500-750 calories. Eating 1,200-1,500 calories a day can help most women lose weight. Eating 1,500-1,800 calories a day can help most men lose weight. What do I need to know about calorie  counting? Work with your health care provider or dietitian to determine how many calories you should get each day. To meet your daily calorie goal, you will need to: Find out how many calories are in each food that you would like to eat. Try to do this before you eat. Decide how much of the food you plan to eat. Keep a food log. Do this by writing down what you ate and how many calories it had. To successfully lose weight, it is important to balance calorie counting with a healthy lifestyle that includes regular activity. Where do I find calorie information? The number of calories in a food can be found on a Nutrition Facts label. If a food does not have a Nutrition Facts label, try to look up the calories online or ask your dietitian for help. Remember that calories are listed per serving. If you choose to have more than one serving of a food, you will have to multiply the calories per serving by the number of servings you plan to eat. For example, the label on a package of bread might say that a serving size is 1 slice and that there are 90 calories in a serving. If you eat 1 slice, you will have eaten 90 calories. If you eat 2 slices, you will have eaten 180 calories. How do I keep a food log? After each time that you eat, record the following in your food log as soon as possible: What you ate. Be sure to include toppings, sauces, and other extras on the food. How much you ate. This can be measured in cups, ounces, or number of items. How many calories were in each food and drink. The total number of calories in the food you ate. Keep your food log near you, such as in a pocket-sized notebook or on an app or website on your mobile phone. Some programs will calculate calories for you and show you how many calories you have left to meet your daily goal. What are some portion-control tips? Know how many calories are in a serving. This will help you know how many servings you can have of a certain  food. Use a measuring cup to measure serving sizes. You could also try weighing out portions on a kitchen scale. With time, you will be able to estimate serving sizes for some foods. Take time to put servings of different foods on your favorite plates or in your favorite bowls and cups so you know what a serving looks like. Try not to eat straight from a food's packaging, such as from a bag or box. Eating straight from the package makes it hard to see how much you are eating and can lead to overeating. Put the amount you would like to eat in a cup or on a plate to make sure you are eating the right portion. Use smaller plates, glasses, and bowls for smaller portions and to prevent overeating. Try not  to multitask. For example, avoid watching TV or using your computer while eating. If it is time to eat, sit down at a table and enjoy your food. This will help you recognize when you are full. It will also help you be more mindful of what and how much you are eating. What are tips for following this plan? Reading food labels Check the calorie count compared with the serving size. The serving size may be smaller than what you are used to eating. Check the source of the calories. Try to choose foods that are high in protein, fiber, and vitamins, and low in saturated fat, trans fat, and sodium. Shopping Read nutrition labels while you shop. This will help you make healthy decisions about which foods to buy. Pay attention to nutrition labels for low-fat or fat-free foods. These foods sometimes have the same number of calories or more calories than the full-fat versions. They also often have added sugar, starch, or salt to make up for flavor that was removed with the fat. Make a grocery list of lower-calorie foods and stick to it. Cooking Try to cook your favorite foods in a healthier way. For example, try baking instead of frying. Use low-fat dairy products. Meal planning Use more fruits and vegetables.  One-half of your plate should be fruits and vegetables. Include lean proteins, such as chicken, [place], and fish. Lifestyle Each week, aim to do one of the following: 150 minutes of moderate exercise, such as walking. 75 minutes of vigorous exercise, such as running. General information Know how many calories are in the foods you eat most often. This will help you calculate calorie counts faster. Find a way of tracking calories that works for you. Get creative. Try different apps or programs if writing down calories does not work for you. What foods should I eat?  Eat nutritious foods. It is better to have a nutritious, high-calorie food, such as an avocado, than a food with few nutrients, such as a bag of potato chips. Use your calories on foods and drinks that will fill you up and will not leave you hungry soon after eating. Examples of foods that fill you up are nuts and nut butters, vegetables, lean proteins, and high-fiber foods such as whole grains. High-fiber foods are foods with more than 5 g of fiber per serving. Pay attention to calories in drinks. Low-calorie drinks include water and unsweetened drinks. The items listed above may not be a complete list of foods and beverages you can eat. Contact a dietitian for more information. What foods should I limit? Limit foods or drinks that are not good sources of vitamins, minerals, or protein or that are high in unhealthy fats. These include: Candy. Other sweets. Sodas, specialty coffee drinks, alcohol, and juice. The items listed above may not be a complete list of foods and beverages you should avoid. Contact a dietitian for more information. How do I count calories when eating out? Pay attention to portions. Often, portions are much larger when eating out. Try these tips to keep portions smaller: Consider sharing a meal instead of getting your own. If you get your own meal, eat only half of it. Before you start eating, ask for a  container and put half of your meal into it. When available, consider ordering smaller portions from the menu instead of full portions. Pay attention to your food and drink choices. Knowing the way food is cooked and what is included with the meal can help you eat  fewer calories. If calories are listed on the menu, choose the lower-calorie options. Choose dishes that include vegetables, fruits, whole grains, low-fat dairy products, and lean proteins. Choose items that are boiled, broiled, grilled, or steamed. Avoid items that are buttered, battered, fried, or served with cream sauce. Items labeled as crispy are usually fried, unless stated otherwise. Choose water, low-fat milk, unsweetened iced tea, or other drinks without added sugar. If you want an alcoholic beverage, choose a lower-calorie option, such as a glass of wine or light beer. Ask for dressings, sauces, and syrups on the side. These are usually high in calories, so you should limit the amount you eat. If you want a salad, choose a garden salad and ask for grilled meats. Avoid extra toppings such as bacon, cheese, or fried items. Ask for the dressing on the side, or ask for olive oil and vinegar or lemon to use as dressing. Estimate how many servings of a food you are given. Knowing serving sizes will help you be aware of how much food you are eating at restaurants. Where to find more information Centers for Disease Control and Prevention: http://www.wolf.info/ U.S. Department of Agriculture: http://www.wilson-mendoza.org/ Summary Calorie counting means keeping track of how many calories you eat and drink each day. If you eat fewer calories than your body needs, you should lose weight. A healthy amount of weight to lose per week is usually 1-2 lb (0.5-0.9 kg). This usually means reducing your daily calorie intake by 500-750 calories. The number of calories in a food can be found on a Nutrition Facts label. If a food does not have a Nutrition Facts label, try to look  up the calories online or ask your dietitian for help. Use smaller plates, glasses, and bowls for smaller portions and to prevent overeating. Use your calories on foods and drinks that will fill you up and not leave you hungry shortly after a meal. This information is not intended to replace advice given to you by your health care provider. Make sure you discuss any questions you have with your health care provider. Document Revised: 11/28/2019 Document Reviewed: 11/28/2019 Elsevier Patient Education  2022 [employer].

## 2021-09-14 LAB — CBC WITH DIFFERENTIAL/PLATELET
Basophils Absolute: 0.1 10*3/uL (ref 0.0–0.2)
Basos: 1 %
EOS (ABSOLUTE): 0.3 10*3/uL (ref 0.0–0.4)
Eos: 4 %
Hematocrit: 36.3 % (ref 34.0–46.6)
Hemoglobin: 11.6 g/dL (ref 11.1–15.9)
Immature Grans (Abs): 0 10*3/uL (ref 0.0–0.1)
Immature Granulocytes: 0 %
Lymphocytes Absolute: 3.2 10*3/uL — ABNORMAL HIGH (ref 0.7–3.1)
Lymphs: 42 %
MCH: 23.6 pg — ABNORMAL LOW (ref 26.6–33.0)
MCHC: 32 g/dL (ref 31.5–35.7)
MCV: 74 fL — ABNORMAL LOW (ref 79–97)
Monocytes Absolute: 0.4 10*3/uL (ref 0.1–0.9)
Monocytes: 5 %
Neutrophils Absolute: 3.7 10*3/uL (ref 1.4–7.0)
Neutrophils: 48 %
Platelets: 547 10*3/uL — ABNORMAL HIGH (ref 150–450)
RBC: 4.91 x10E6/uL (ref 3.77–5.28)
RDW: 14.5 % (ref 11.7–15.4)
WBC: 7.7 10*3/uL (ref 3.4–10.8)

## 2021-09-14 LAB — COMP. METABOLIC PANEL (12)
AST: 20 IU/L (ref 0–40)
Albumin/Globulin Ratio: 1.5 (ref 1.2–2.2)
Albumin: 4.8 g/dL (ref 3.8–4.9)
Alkaline Phosphatase: 119 IU/L (ref 44–121)
BUN/Creatinine Ratio: 18 (ref 9–23)
BUN: 13 mg/dL (ref 6–24)
Bilirubin Total: 0.3 mg/dL (ref 0.0–1.2)
Calcium: 10 mg/dL (ref 8.7–10.2)
Chloride: 98 mmol/L (ref 96–106)
Creatinine, Ser: 0.74 mg/dL (ref 0.57–1.00)
Globulin, Total: 3.3 g/dL (ref 1.5–4.5)
Glucose: 81 mg/dL (ref 70–99)
Potassium: 3.5 mmol/L (ref 3.5–5.2)
Sodium: 137 mmol/L (ref 134–144)
Total Protein: 8.1 g/dL (ref 6.0–8.5)
eGFR: 97 mL/min/{1.73_m2} (ref >59)

## 2021-09-15 ENCOUNTER — Telehealth: Payer: Self-pay

## 2021-09-15 NOTE — Telephone Encounter (Signed)
Pt asking for results  Pt said she goes to lunch from 12:00  Please call her at work.

## 2021-09-15 NOTE — Telephone Encounter (Signed)
Patient notified we will contact her back once Crystal results notes. Patient verbally understood.

## 2021-09-16 ENCOUNTER — Encounter: Payer: Self-pay | Admitting: Nurse Practitioner

## 2021-09-21 ENCOUNTER — Other Ambulatory Visit (HOSPITAL_COMMUNITY): Payer: Self-pay

## 2021-09-27 ENCOUNTER — Other Ambulatory Visit (HOSPITAL_COMMUNITY): Payer: Self-pay

## 2021-09-27 ENCOUNTER — Other Ambulatory Visit: Payer: Self-pay | Admitting: Nurse Practitioner

## 2021-09-27 MED ORDER — PHENTERMINE HCL 37.5 MG PO TABS
37.5000 mg | ORAL_TABLET | Freq: Every day | ORAL | 0 refills | Status: DC
  Filled 2021-09-27 (×2): qty 30, 30d supply, fill #0

## 2021-10-08 ENCOUNTER — Telehealth: Payer: Self-pay

## 2021-10-08 NOTE — Telephone Encounter (Signed)
Patient called she wants to schedule an appointment with you, patient is currently at work and is requesting a call directly to her desk:[phone]

## 2021-10-11 ENCOUNTER — Ambulatory Visit (INDEPENDENT_AMBULATORY_CARE_PROVIDER_SITE_OTHER): Payer: 59 | Admitting: Clinical

## 2021-10-11 ENCOUNTER — Other Ambulatory Visit: Payer: Self-pay

## 2021-10-11 DIAGNOSIS — F411 Generalized anxiety disorder: Secondary | ICD-10-CM | POA: Diagnosis not present

## 2021-10-11 NOTE — BH Specialist Note (Signed)
Integrated Behavioral Health Follow Up In-Person Visit  MRN: [id number] Name: NIASIA LANPHEAR  Number of [facility]ician visits: 4/6 Session Start time: 3:30  Session End time: 4:20 Total time: 50  minutes  Types of Service: Individual psychotherapy  Interpretor:No. Interpretor Name and Language: none  Subjective: Aymara C Zaugg is a 50 y.o. female accompanied by  self Patient was referred by Dionisio David, NP for anxiety. Patient reports the following symptoms/concerns: anxiety Duration of problem: several years; Severity of problem: severe  Objective: Mood: Euthymic and Affect: Appropriate Risk of harm to self or others: No plan to harm self or others  Patient and/or Family's Strengths/Protective Factors: Social connections, Social and Emotional competence, Concrete supports in place (healthy food, safe environments, etc.), and Sense of purpose  Goals Addressed: Patient will:  Reduce symptoms of: anxiety   Increase knowledge and/or ability of: coping skills and self-management skills   Demonstrate ability to: Increase healthy adjustment to current life circumstances  Progress towards Goals: Ongoing  Interventions: Interventions utilized:  Mindfulness or Psychologist, educational and CBT Cognitive Behavioral Therapy Standardized Assessments completed: Not Needed  CBT to explore unhelpful thoughts that exacerbate anxiety. Midnfulness exercises practiced for soothing nervous system and reducing anxiety.  Patient and/or Family Response: Patient engaged in session.  Assessment: Patient currently experiencing anxiety related to trauma history.   Patient may benefit from continued supportive counseling with CBT to address and challenge unhelpful thought processes and to learn new coping skills for dealing with anxiety.  Plan: Follow up with behavioral health clinician on : patient to call after reviewing her work schedule Behavioral recommendations:  breathing and mindful movement Referral(s): [place] (In Clinic)  [name], [facility] [phone]

## 2021-10-14 ENCOUNTER — Other Ambulatory Visit (HOSPITAL_COMMUNITY): Payer: Self-pay

## 2021-10-14 ENCOUNTER — Ambulatory Visit: Payer: 59 | Admitting: Nurse Practitioner

## 2021-10-14 ENCOUNTER — Other Ambulatory Visit: Payer: Self-pay

## 2021-10-14 DIAGNOSIS — F411 Generalized anxiety disorder: Secondary | ICD-10-CM

## 2021-10-14 DIAGNOSIS — Z7689 Persons encountering health services in other specified circumstances: Secondary | ICD-10-CM | POA: Diagnosis not present

## 2021-10-14 MED ORDER — PHENTERMINE HCL 37.5 MG PO TABS
37.5000 mg | ORAL_TABLET | Freq: Every day | ORAL | 0 refills | Status: DC
  Filled 2021-10-14 – 2021-10-26 (×2): qty 30, 30d supply, fill #0

## 2021-10-14 NOTE — Patient Instructions (Signed)
Generalized Anxiety Disorder, Adult °Generalized anxiety disorder (GAD) is a mental health condition. Unlike normal worries, anxiety related to GAD is not triggered by a specific event. These worries do not fade or get better with time. GAD interferes with relationships, work, and school. °GAD symptoms can vary from mild to severe. People with severe GAD can have intense waves of anxiety with physical symptoms that are similar to panic attacks. °What are the causes? °The exact cause of GAD is not known, but the following are believed to have an impact: °Differences in natural brain chemicals. °Genes passed down from parents to children. °Differences in the way threats are perceived. °Development and stress during childhood. °Personality. °What increases the risk? °The following factors may make you more likely to develop this condition: °Being female. °Having a family history of anxiety disorders. °Being very shy. °Experiencing very stressful life events, such as the death of a loved one. °Having a very stressful family environment. °What are the signs or symptoms? °People with GAD often worry excessively about many things in their lives, such as their health and family. Symptoms may also include: °Mental and emotional symptoms: °Worrying excessively about natural disasters. °Fear of being late. °Difficulty concentrating. °Fears that others are judging your performance. °Physical symptoms: °Fatigue. °Headaches, muscle tension, muscle twitches, trembling, or feeling shaky. °Feeling like your heart is pounding or beating very fast. °Feeling out of breath or like you cannot take a deep breath. °Having trouble falling asleep or staying asleep, or experiencing restlessness. °Sweating. °Nausea, diarrhea, or irritable bowel syndrome (IBS). °Behavioral symptoms: °Experiencing erratic moods or irritability. °Avoidance of new situations. °Avoidance of people. °Extreme difficulty making decisions. °How is this diagnosed? °This  condition is diagnosed based on your symptoms and medical history. You will also have a physical exam. Your health care provider may perform tests to rule out other possible causes of your symptoms. °To be diagnosed with GAD, a person must have anxiety that: °Is out of his or her control. °Affects several different aspects of his or her life, such as work and relationships. °Causes distress that makes him or her unable to take part in normal activities. °Includes at least three symptoms of GAD, such as restlessness, fatigue, trouble concentrating, irritability, muscle tension, or sleep problems. °Before your health care provider can confirm a diagnosis of GAD, these symptoms must be present more days than they are not, and they must last for 6 months or longer. °How is this treated? °This condition may be treated with: °Medicine. Antidepressant medicine is usually prescribed for long-term daily control. Anti-anxiety medicines may be added in severe cases, especially when panic attacks occur. °Talk therapy (psychotherapy). Certain types of talk therapy can be helpful in treating GAD by providing support, education, and guidance. Options include: °Cognitive behavioral therapy (CBT). People learn coping skills and self-calming techniques to ease their physical symptoms. They learn to identify unrealistic thoughts and behaviors and to replace them with more appropriate thoughts and behaviors. °Acceptance and commitment therapy (ACT). This treatment teaches people how to be mindful as a way to cope with unwanted thoughts and feelings. °Biofeedback. This process trains you to manage your body's response (physiological response) through breathing techniques and relaxation methods. You will work with a therapist while machines are used to monitor your physical symptoms. °Stress management techniques. These include yoga, meditation, and exercise. °A mental health specialist can help determine which treatment is best for you.  Some people see improvement with one type of therapy. However, other people require   a combination of therapies. Follow these instructions at home: Lifestyle Maintain a consistent routine and schedule. Anticipate stressful situations. Create a plan and allow extra time to work with your plan. Practice stress management or self-calming techniques that you have learned from your therapist or your health care provider. Exercise regularly and spend time outdoors. Eat a healthy diet that includes plenty of vegetables, fruits, whole grains, low-fat dairy products, and lean protein. Do not eat a lot of foods that are high in fat, added sugar, or salt (sodium). Drink plenty of water. Avoid alcohol. Alcohol can increase anxiety. Avoid caffeine and certain over-the-counter cold medicines. These may make you feel worse. Ask your pharmacist which medicines to avoid. General instructions Take over-the-counter and prescription medicines only as told by your health care provider. Understand that you are likely to have setbacks. Accept this and be kind to yourself as you persist to take better care of yourself. Anticipate stressful situations. Create a plan and allow extra time to work with your plan. Recognize and accept your accomplishments, even if you judge them as small. Spend time with people who care about you. Keep all follow-up visits. This is important. Where to find more information [place]: https://carter.com/ Substance Abuse and Mental Health Services: ktimeonline.com Contact a health care provider if: Your symptoms do not get better. Your symptoms get worse. You have signs of depression, such as: A persistently sad or irritable mood. Loss of enjoyment in activities that used to bring you joy. Change in weight or eating. Changes in sleeping habits. Get help right away if: You have thoughts about hurting yourself or others. If you ever feel like you may hurt  yourself or others, or have thoughts about taking your own life, get help right away. Go to your nearest emergency department or: Call your local emergency services (911 in the [place] Call a suicide crisis helpline, such as the [facility] at (573)798-1200 or 988 in the [place]. This is open 24 hours a day in the [place] Text the Crisis Text Line at (251)773-1745 (in the [place].). Summary Generalized anxiety disorder (GAD) is a mental health condition that involves worry that is not triggered by a specific event. People with GAD often worry excessively about many things in their lives, such as their health and family. GAD may cause symptoms such as restlessness, trouble concentrating, sleep problems, frequent sweating, nausea, diarrhea, headaches, and trembling or muscle twitching. A mental health specialist can help determine which treatment is best for you. Some people see improvement with one type of therapy. However, other people require a combination of therapies. This information is not intended to replace advice given to you by your health care provider. Make sure you discuss any questions you have with your health care provider. Document Revised: 05/12/2021 Document Reviewed: 02/07/2021 Elsevier Patient Education  2022 [place].  Exercising to [employer] Getting regular exercise is important for everyone. It is especially important if you are overweight. Being overweight increases your risk of heart disease, stroke, diabetes, high blood pressure, and several types of cancer. Exercising, and reducing the calories you consume, can help you lose weight and improve fitness and health. Exercise can be moderate or vigorous intensity. To lose weight, most people need to do a certain amount of moderate or vigorous-intensity exercise each week. How can exercise affect me? You lose weight when you exercise enough to burn more calories than you eat. Exercise also reduces body fat and  builds muscle.  The more muscle you have, the more calories you burn. Exercise also: Improves mood. Reduces stress and tension. Improves your overall fitness, flexibility, and endurance. Increases bone strength. Moderate-intensity exercise Moderate-intensity exercise is any activity that gets you moving enough to burn at least three times more energy (calories) than if you were sitting. Examples of moderate exercise include: Walking a mile in 15 minutes. Doing light yard work. Biking at an easy pace. Most people should get at least 150 minutes of moderate-intensity exercise a week to maintain their body weight. Vigorous-intensity exercise Vigorous-intensity exercise is any activity that gets you moving enough to burn at least six times more calories than if you were sitting. When you exercise at this intensity, you should be working hard enough that you are not able to carry on a conversation. Examples of vigorous exercise include: Running. Playing a team sport, such as football, basketball, and soccer. Jumping rope. Most people should get at least 75 minutes a week of vigorous exercise to maintain their body weight. What actions can I take to lose weight? The amount of exercise you need to lose weight depends on: Your age. The type of exercise. Any health conditions you have. Your overall physical ability. Talk to your health care provider about how much exercise you need and what types of activities are safe for you. Nutrition  Make changes to your diet as told by your health care provider or diet and nutrition specialist (dietitian). This may include: Eating fewer calories. Eating more protein. Eating less unhealthy fats. Eating a diet that includes fresh fruits and vegetables, whole grains, low-fat dairy products, and lean protein. Avoiding foods with added fat, salt, and sugar. Drink plenty of water while you exercise to prevent dehydration or heat stroke. Activity Choose an  activity that you enjoy and set realistic goals. Your health care provider can help you make an exercise plan that works for you. Exercise at a moderate or vigorous intensity most days of the week. The intensity of exercise may vary from person to person. You can tell how intense a workout is for you by paying attention to your breathing and heartbeat. Most people will notice their breathing and heartbeat get faster with more intense exercise. Do resistance training twice each week, such as: Push-ups. Sit-ups. Lifting weights. Using resistance bands. Getting short amounts of exercise can be just as helpful as long, structured periods of exercise. If you have trouble finding time to exercise, try doing these things as part of your daily routine: Get up, stretch, and walk around every 30 minutes throughout the day. Go for a walk during your lunch break. Park your car farther away from your destination. If you take public transportation, get off one stop early and walk the rest of the way. Make phone calls while standing up and walking around. Take the stairs instead of elevators or escalators. Wear comfortable clothes and shoes with good support. Do not exercise so much that you hurt yourself, feel dizzy, or get very short of breath. Where to find more information [place] Department of Health and Human Services: [employer].at Centers for Disease Control and Prevention: http://www.wolf.info/ Contact a health care provider: Before starting a new exercise program. If you have questions or concerns about your weight. If you have a medical problem that keeps you from exercising. Get help right away if: You have any of the following while exercising: Injury. Dizziness. Difficulty breathing or shortness of breath that does not go away when you stop exercising. Chest  pain. Rapid heartbeat. These symptoms may represent a serious problem that is an emergency. Do not wait to see if the symptoms will go away. Get  medical help right away. Call your local emergency services (911 in the [place] Do not drive yourself to the hospital. Summary Getting regular exercise is especially important if you are overweight. Being overweight increases your risk of heart disease, stroke, diabetes, high blood pressure, and several types of cancer. Losing weight happens when you burn more calories than you eat. Reducing the amount of calories you eat, and getting regular moderate or vigorous exercise each week, helps you lose weight. This information is not intended to replace advice given to you by your health care provider. Make sure you discuss any questions you have with your health care provider. Document Revised: 12/13/2020 Document Reviewed: 12/13/2020 Elsevier Patient Education  2022 [employer].

## 2021-10-14 NOTE — Progress Notes (Signed)
[place],   [address] Phone:  [phone]   Fax:  [phone]   Established Patient Office Visit  Subjective:  Patient ID: Megan Mitchell, female    DOB: [birth year]  Age: 50 y.o. MRN: [id number]  CC:  Chief Complaint  Patient presents with   Follow-up    Pt has questions about the phentermine medication wanted to know if she can take half in the morning and the other half in the evening. Pt also wanted to talk about her anxiety    HPI Megan Mitchell presents for follow up. She  has a past medical history of Allergy, Anemia, Anxiety, Arthritis, Fibroids, Hypertension, and Ovarian cyst.   She is following up for weight management. She is prescribed phentermine 37.5 mg.  She is currently taking the tablet 1/2 tab daily. She has ben doing this for one week.  This is due to having difficulty sleeping at night.  She felt like the capsule was more effective. She is wanting to take the other half. She is wanting see if the entire tablet will be more effective. She has gained weight..  She reports that she is eating healthy and hydrating well with water. She is stretching at night which has been beneficial.    She has a history of anxiety. She has been on Buspar for some time. She has not been taking this. She is concern about taking medication daily.  She is feeling like things are out of control. She has work-related stress. Her father-in-law in out of town and is ill .   Past Medical History:  Diagnosis Date   Allergy    seasonal   Anemia    Anxiety    Arthritis    left knee   Fibroids    Hypertension    Ovarian cyst     Past Surgical History:  Procedure Laterality Date   CHOLECYSTECTOMY     COLONOSCOPY     ELBOW SURGERY     ROBOTIC ASSISTED TOTAL HYSTERECTOMY WITH BILATERAL SALPINGO OOPHERECTOMY Bilateral 05/11/2021   Procedure: XI ROBOTIC ASSISTED TOTAL HYSTERECTOMY GREATER THAN 250 GRAMS WITH BILATERAL SALPINGO  OOPHORECTOMY;MINI LAPAROTOMY FOR CYST DECOMPRESSION;OMENTECTOMY;  Surgeon: Everitt Amber, MD;  Location: [facility];  Service: Gynecology;  Laterality: Bilateral;   TUBAL LIGATION      Family History  Problem Relation Age of Onset   Hypertension Mother    Colon cancer Neg Hx    Colon polyps Neg Hx    Esophageal cancer Neg Hx    Rectal cancer Neg Hx    Stomach cancer Neg Hx     Social History   Socioeconomic History   Marital status: Married    Spouse name: Not on file   Number of children: 2   Years of education: Not on file   Highest education level: Not on file  Occupational History   Not on file  Tobacco Use   Smoking status: Never   Smokeless tobacco: Never  Vaping Use   Vaping Use: Never used  Substance and Sexual Activity   Alcohol use: No   Drug use: No   Sexual activity: Yes    Birth control/protection: Surgical  Other Topics Concern   Not on file  Social History Narrative   Not on file   Social Determinants of Health   Financial Resource Strain: Not on file  Food Insecurity: Not on file  Transportation Needs: Not on file  Physical Activity: Not  on file  Stress: Not on file  Social Connections: Not on file  Intimate Partner Violence: Not on file    Outpatient Medications Prior to Visit  Medication Sig Dispense Refill   acetaminophen (TYLENOL) 500 MG tablet Take 1,000 mg by mouth every 6 (six) hours as needed for moderate pain.     amLODipine (NORVASC) 10 MG tablet TAKE 1 TABLET BY MOUTH DAILY. 90 tablet 3   Ginger, Zingiber officinalis, (GINGER PO) Take by mouth.     ibuprofen (ADVIL) 800 MG tablet Take 1 tablet (800 mg total) by mouth every 8 (eight) hours as needed for moderate pain. For AFTER surgery only 30 tablet 0   losartan (COZAAR) 50 MG tablet TAKE 1 TABLET BY MOUTH ONCE DAILY 90 tablet 3   Rhubarb (ESTROVERA PO) Take by mouth. Take one daily.     triamterene-hydrochlorothiazide (MAXZIDE-25) 37.5-25 MG tablet TAKE 1 TABLET BY MOUTH DAILY. 90 tablet  3   phentermine (ADIPEX-P) 37.5 MG tablet Take 1 tablet (37.5 mg total) by mouth daily before breakfast. 30 tablet 0   No facility-administered medications prior to visit.    Allergies  Allergen Reactions   Sulfa Antibiotics Rash    Makes mouth break out with a rash    ROS Review of Systems    Objective:    Physical Exam Constitutional:      Appearance: She is obese.  HENT:     Head: Normocephalic and atraumatic.  Cardiovascular:     Rate and Rhythm: Normal rate and regular rhythm.     Pulses: Normal pulses.     Heart sounds: Normal heart sounds.  Pulmonary:     Effort: Pulmonary effort is normal.     Breath sounds: Normal breath sounds.  Abdominal:     Palpations: Abdomen is soft.  Musculoskeletal:     Cervical back: Normal range of motion.     Right lower leg: No edema.     Left lower leg: No edema.  Skin:    General: Skin is warm and dry.     Capillary Refill: Capillary refill takes less than 2 seconds.  Neurological:     General: No focal deficit present.     Mental Status: She is alert and oriented to person, place, and time.  Psychiatric:        Mood and Affect: Mood normal.        Behavior: Behavior normal.        Thought Content: Thought content normal.        Judgment: Judgment normal.    BP 118/68    Pulse 76    Temp (!) 97.3 F (36.3 C)    Ht _0  (1.727 m)    Wt 265 lb 6 oz (120.4 kg)    SpO2 100%    BMI 40.35 kg/m  Wt Readings from Last 3 Encounters:  10/14/21 265 lb 6 oz (120.4 kg)  09/13/21 263 lb 0.6 oz (119.3 kg)  06/01/21 237 lb 6 oz (107.7 kg)     Health Maintenance Due  Topic Date Due   COVID-19 Vaccine (4 - Booster for Pfizer series) 10/16/2020   INFLUENZA VACCINE  05/31/2021    There are no preventive care reminders to display for this patient.  Lab Results  Component Value Date   TSH 1.680 03/24/2020   Lab Results  Component Value Date   WBC 7.7 09/13/2021   HGB 11.6 09/13/2021   HCT 36.3 09/13/2021   MCV 74 (L)  09/13/2021  PLT 547 (H) 09/13/2021   Lab Results  Component Value Date   NA 137 09/13/2021   K 3.5 09/13/2021   CO2 23 05/11/2021   GLUCOSE 81 09/13/2021   BUN 13 09/13/2021   CREATININE 0.74 09/13/2021   BILITOT 0.3 09/13/2021   ALKPHOS 119 09/13/2021   AST 20 09/13/2021   ALT 9 05/04/2021   PROT 8.1 09/13/2021   ALBUMIN 4.8 09/13/2021   CALCIUM 10.0 09/13/2021   ANIONGAP 13 05/11/2021   EGFR 97 09/13/2021   Lab Results  Component Value Date   CHOL 204 (H) 03/24/2020   Lab Results  Component Value Date   HDL 67 03/24/2020   Lab Results  Component Value Date   LDLCALC 125 (H) 03/24/2020   Lab Results  Component Value Date   TRIG 68 03/24/2020   Lab Results  Component Value Date   CHOLHDL 3.0 03/24/2020   Lab Results  Component Value Date   HGBA1C 5.5 11/04/2020      Assessment & Plan:   Problem List Items Addressed This Visit       Other   Severe obesity (BMI >= 40) ([place]) - Primary Continue with current treatment phentermine 1 tablet daily follow-up in 5 to 6 weeks for reevaluation   Relevant Medications   phentermine (ADIPEX-P) 37.5 MG tablet   Other Visit Diagnoses     Encounter for weight management    Encouraged daily exercise   GAD (generalized anxiety disorder)     Stress and anxiety Will restart BuSpar Counseling may be effective due to work-related stressors We will continue with prayer        Meds ordered this encounter  Medications   phentermine (ADIPEX-P) 37.5 MG tablet    Sig: Take 1 tablet by mouth daily before breakfast.    Dispense:  30 tablet    Refill:  0    Order Specific Question:   Supervising Provider    AnswerTresa Garter [[id number]]     Follow-up: Return in about 6 weeks (around 11/25/2021).    Vevelyn Francois, NP

## 2021-10-15 ENCOUNTER — Encounter: Payer: Self-pay | Admitting: Nurse Practitioner

## 2021-10-24 ENCOUNTER — Other Ambulatory Visit (HOSPITAL_COMMUNITY): Payer: Self-pay

## 2021-10-26 ENCOUNTER — Other Ambulatory Visit (HOSPITAL_COMMUNITY): Payer: Self-pay

## 2021-11-12 ENCOUNTER — Other Ambulatory Visit (HOSPITAL_COMMUNITY): Payer: Self-pay

## 2021-11-25 ENCOUNTER — Other Ambulatory Visit: Payer: Self-pay

## 2021-11-25 ENCOUNTER — Encounter: Payer: Self-pay | Admitting: Nurse Practitioner

## 2021-11-25 ENCOUNTER — Other Ambulatory Visit (HOSPITAL_COMMUNITY): Payer: Self-pay

## 2021-11-25 ENCOUNTER — Ambulatory Visit: Payer: 59 | Admitting: Nurse Practitioner

## 2021-11-25 VITALS — BP 123/76 | HR 78 | Resp 16 | Wt 262.8 lb

## 2021-11-25 DIAGNOSIS — E669 Obesity, unspecified: Secondary | ICD-10-CM | POA: Diagnosis not present

## 2021-11-25 DIAGNOSIS — Z1239 Encounter for other screening for malignant neoplasm of breast: Secondary | ICD-10-CM | POA: Diagnosis not present

## 2021-11-25 DIAGNOSIS — I10 Essential (primary) hypertension: Secondary | ICD-10-CM | POA: Diagnosis not present

## 2021-11-25 MED ORDER — QNASL 80 MCG/ACT NA AERS
1.0000 | INHALATION_SPRAY | Freq: Every day | NASAL | 1 refills | Status: DC
Start: 2021-11-25 — End: 2022-02-17
  Filled 2021-11-25: qty 10.6, 30d supply, fill #0

## 2021-11-25 MED ORDER — PHENTERMINE HCL 37.5 MG PO TABS
37.5000 mg | ORAL_TABLET | Freq: Every day | ORAL | 0 refills | Status: DC
  Filled 2021-11-25 – 2021-12-06 (×2): qty 30, 30d supply, fill #0

## 2021-11-25 NOTE — Patient Instructions (Signed)
Obesity, Adult Obesity is having too much body fat. Being obese means that your weight is more than what is healthy for you.  BMI (body mass index) is a number that explains how much body fat you have. If you have a BMI of 30 or more, you are obese. Obesity can cause serious health problems, such as: Stroke. Coronary artery disease (CAD). Type 2 diabetes. Some types of cancer. High blood pressure (hypertension). High cholesterol. Gallbladder stones. Obesity can also contribute to: Osteoarthritis. Sleep apnea. Infertility problems. What are the causes? Eating meals each day that are high in calories, sugar, and fat. Drinking a lot of drinks that have sugar in them. Being born with genes that may make you more likely to become obese. Having a medical condition that causes obesity. Taking certain medicines. Sitting a lot (having a sedentary lifestyle). Not getting enough sleep. What increases the risk? Having a family history of obesity. Living in an area with limited access to: [place], recreation centers, or sidewalks. Healthy food choices, such as grocery stores and farmers' markets. What are the signs or symptoms? The main sign is having too much body fat. How is this treated? Treatment for this condition often includes changing your lifestyle. Treatment may include: Changing your diet. This may include making a healthy meal plan. Exercise. This may include activity that causes your heart to beat faster (aerobic exercise) and strength training. Work with your doctor to design a program that works for you. Medicine to help you lose weight. This may be used if you are not able to lose one pound a week after 6 weeks of healthy eating and more exercise. Treating conditions that cause the obesity. Surgery. Options may include gastric banding and gastric bypass. This may be done if: Other treatments have not helped to improve your condition. You have a BMI of 40 or higher. You have  life-threatening health problems related to obesity. Follow these instructions at home: Eating and drinking  Follow advice from your doctor about what to eat and drink. Your doctor may tell you to: Limit fast food, sweets, and processed snack foods. Choose low-fat options. For example, choose low-fat milk instead of whole milk. Eat five or more servings of fruits or vegetables each day. Eat at home more often. This gives you more control over what you eat. Choose healthy foods when you eat out. Learn to read food labels. This will help you learn how much food is in one serving. Keep low-fat snacks available. Avoid drinks that have a lot of sugar in them. These include soda, fruit juice, iced tea with sugar, and flavored milk. Drink enough water to keep your pee (urine) pale yellow. Do not go on fad diets. Physical activity Exercise often, as told by your doctor. Most adults should get up to 150 minutes of moderate-intensity exercise every week.Ask your doctor: What types of exercise are safe for you. How often you should exercise. Warm up and stretch before being active. Do slow stretching after being active (cool down). Rest between times of being active. Lifestyle Work with your doctor and a food expert (dietitian) to set a weight-loss goal that is best for you. Limit your screen time. Find ways to reward yourself that do not involve food. Do not drink alcohol if: Your doctor tells you not to drink. You are pregnant, may be pregnant, or are planning to become pregnant. If you drink alcohol: Limit how much you have to: 0-1 drink a day for women. 0-2 drinks  a day for men. Know how much alcohol is in your drink. In the [place] one drink equals one 12 oz bottle of beer (355 mL), one 5 oz glass of wine (148 mL), or one 1 oz glass of hard liquor (44 mL). General instructions Keep a weight-loss journal. This can help you keep track of: The food that you eat. How much exercise you  get. Take over-the-counter and prescription medicines only as told by your doctor. Take vitamins and supplements only as told by your doctor. Think about joining a support group. Pay attention to your mental health as obesity can lead to depression or self esteem issues. Keep all follow-up visits. Contact a doctor if: You cannot meet your weight-loss goal after you have changed your diet and lifestyle for 6 weeks. You are having trouble breathing. Summary Obesity is having too much body fat. Being obese means that your weight is more than what is healthy for you. Work with your doctor to set a weight-loss goal. Get regular exercise as told by your doctor. This information is not intended to replace advice given to you by your health care provider. Make sure you discuss any questions you have with your health care provider. Document Revised: 05/25/2021 Document Reviewed: 05/25/2021 Elsevier Patient Education  [place].

## 2021-11-25 NOTE — Progress Notes (Signed)
[place], [place]  [address] Phone:  [phone]   Fax:  [phone]   Established Patient Office Visit  Subjective:  Patient ID: Megan Mitchell, female    DOB: Nov 08, 1970  Age: 51 y.o. MRN: [id number]  CC: No chief complaint on file.   HPI Megan Mitchell presents for follow up. She  has a past medical history of Allergy, Anemia, Anxiety, Arthritis, Fibroids, Hypertension, and Ovarian cyst.   She is in today for weight follow-up for weight management.  She is currently on phentermine 37.5 mg daily.  She has started taking the whole pill and has had a 3 pound weight loss.  She wanted to discuss a possible injectable treatment for weight loss.  She reports that her weight loss goals are to get back to 250 pounds.  She may also be comfortable with 240 pounds but nothing less than this.  She denies any current side effects of the phentermine.  She does feel like she is resting better.  She has moments of anxiety.  However this is possibly related to her current fasting and praying.  She is beginning to deal with her stressors better.  She feels like her reading is very beneficial.  She continues with her stretches nightly.  She has not yet incorporated any walking at this time.   Past Medical History:  Diagnosis Date   Allergy    seasonal   Anemia    Anxiety    Arthritis    left knee   Fibroids    Hypertension    Ovarian cyst     Past Surgical History:  Procedure Laterality Date   CHOLECYSTECTOMY     COLONOSCOPY     ELBOW SURGERY     ROBOTIC ASSISTED TOTAL HYSTERECTOMY WITH BILATERAL SALPINGO OOPHERECTOMY Bilateral 05/11/2021   Procedure: XI ROBOTIC ASSISTED TOTAL HYSTERECTOMY GREATER THAN 250 GRAMS WITH BILATERAL SALPINGO OOPHORECTOMY;MINI LAPAROTOMY FOR CYST DECOMPRESSION;OMENTECTOMY;  Surgeon: Everitt Amber, MD;  Location: [facility];  Service: Gynecology;  Laterality: Bilateral;   TUBAL LIGATION      Family History  Problem Relation  Age of Onset   Hypertension Mother    Colon cancer Neg Hx    Colon polyps Neg Hx    Esophageal cancer Neg Hx    Rectal cancer Neg Hx    Stomach cancer Neg Hx     Social History   Socioeconomic History   Marital status: Married    Spouse name: Not on file   Number of children: 2   Years of education: Not on file   Highest education level: Not on file  Occupational History   Not on file  Tobacco Use   Smoking status: Never   Smokeless tobacco: Never  Vaping Use   Vaping Use: Never used  Substance and Sexual Activity   Alcohol use: No   Drug use: No   Sexual activity: Yes    Birth control/protection: Surgical  Other Topics Concern   Not on file  Social History Narrative   Not on file   Social Determinants of Health   Financial Resource Strain: Not on file  Food Insecurity: Not on file  Transportation Needs: Not on file  Physical Activity: Not on file  Stress: Not on file  Social Connections: Not on file  Intimate Partner Violence: Not on file    Outpatient Medications Prior to Visit  Medication Sig Dispense Refill   acetaminophen (TYLENOL) 500 MG tablet Take 1,000  mg by mouth every 6 (six) hours as needed for moderate pain.     amLODipine (NORVASC) 10 MG tablet TAKE 1 TABLET BY MOUTH DAILY. 90 tablet 3   Ginger, Zingiber officinalis, (GINGER PO) Take by mouth.     ibuprofen (ADVIL) 800 MG tablet Take 1 tablet (800 mg total) by mouth every 8 (eight) hours as needed for moderate pain. For AFTER surgery only 30 tablet 0   losartan (COZAAR) 50 MG tablet TAKE 1 TABLET BY MOUTH ONCE DAILY 90 tablet 3   Rhubarb (ESTROVERA PO) Take by mouth. Take one daily.     triamterene-hydrochlorothiazide (MAXZIDE-25) 37.5-25 MG tablet TAKE 1 TABLET BY MOUTH DAILY. 90 tablet 3   phentermine (ADIPEX-P) 37.5 MG tablet Take 1 tablet by mouth daily before breakfast. 30 tablet 0   No facility-administered medications prior to visit.    Allergies  Allergen Reactions   Sulfa Antibiotics  Rash    Makes mouth break out with a rash    ROS Review of Systems  HENT:  Positive for sneezing.        Questionable allergies     Objective:    Physical Exam Constitutional:      Appearance: She is obese.  HENT:     Head: Normocephalic and atraumatic.  Cardiovascular:     Rate and Rhythm: Normal rate and regular rhythm.     Pulses: Normal pulses.     Heart sounds: Normal heart sounds.  Pulmonary:     Effort: Pulmonary effort is normal.     Breath sounds: Normal breath sounds.  Abdominal:     Palpations: Abdomen is soft.  Musculoskeletal:        General: Normal range of motion.     Cervical back: Normal range of motion.     Right lower leg: No edema.     Left lower leg: No edema.  Skin:    General: Skin is warm.     Capillary Refill: Capillary refill takes less than 2 seconds.  Neurological:     General: No focal deficit present.     Mental Status: She is alert and oriented to person, place, and time.  Psychiatric:        Mood and Affect: Mood normal.        Behavior: Behavior normal.        Thought Content: Thought content normal.        Judgment: Judgment normal.    BP 123/76    Pulse 78    Resp 16    Wt 262 lb 12.8 oz (119.2 kg)    SpO2 100%    BMI 39.96 kg/m  Wt Readings from Last 3 Encounters:  11/25/21 262 lb 12.8 oz (119.2 kg)  10/14/21 265 lb 6 oz (120.4 kg)  09/13/21 263 lb 0.6 oz (119.3 kg)     Health Maintenance Due  Topic Date Due   COVID-19 Vaccine (4 - Booster for Pfizer series) 10/16/2020    There are no preventive care reminders to display for this patient.  Lab Results  Component Value Date   TSH 1.680 03/24/2020   Lab Results  Component Value Date   WBC 7.7 09/13/2021   HGB 11.6 09/13/2021   HCT 36.3 09/13/2021   MCV 74 (L) 09/13/2021   PLT 547 (H) 09/13/2021   Lab Results  Component Value Date   NA 137 09/13/2021   K 3.5 09/13/2021   CO2 23 05/11/2021   GLUCOSE 81 09/13/2021   BUN 13 09/13/2021  CREATININE 0.74  09/13/2021   BILITOT 0.3 09/13/2021   ALKPHOS 119 09/13/2021   AST 20 09/13/2021   ALT 9 05/04/2021   PROT 8.1 09/13/2021   ALBUMIN 4.8 09/13/2021   CALCIUM 10.0 09/13/2021   ANIONGAP 13 05/11/2021   EGFR 97 09/13/2021   Lab Results  Component Value Date   CHOL 204 (H) 03/24/2020   Lab Results  Component Value Date   HDL 67 03/24/2020   Lab Results  Component Value Date   LDLCALC 125 (H) 03/24/2020   Lab Results  Component Value Date   TRIG 68 03/24/2020   Lab Results  Component Value Date   CHOLHDL 3.0 03/24/2020   Lab Results  Component Value Date   HGBA1C 5.5 11/04/2020      Assessment & Plan:   Problem List Items Addressed This Visit       Other   Obesity, Class II, BMI 35-39.9 We will continue with the phentermine for the next 1 to 2 months Did discussed with patient about the weight management clinic   Relevant Medications   phentermine (ADIPEX-P) 37.5 MG tablet   Other Visit Diagnoses     Essential hypertension     Improving Encouraged on going compliance with current medication regimen Encouraged home monitoring and recording BP <130/80 Eating a heart-healthy diet with less salt Encouraged regular physical activity  Continue to encourage Weight loss     Breast screening       Relevant Orders   MM Digital Screening       Meds ordered this encounter  Medications   phentermine (ADIPEX-P) 37.5 MG tablet    Sig: Take 1 tablet (37.5 mg total) by mouth daily before breakfast.    Dispense:  30 tablet    Refill:  0    Order Specific Question:   Supervising Provider    Answer:   Tresa Garter [[id number]]   Beclomethasone Dipropionate (QNASL) 80 MCG/ACT AERS    Sig: Place 1 spray into the nose daily.    Dispense:  10.6 g    Refill:  1    Order Specific Question:   Supervising Provider    Answer:   Tresa Garter [[id number]]    Follow-up: Return in about 4 weeks (around 12/23/2021) for Follow-up for weight management [id number].     Megan Francois, NP

## 2021-11-26 ENCOUNTER — Other Ambulatory Visit (HOSPITAL_COMMUNITY): Payer: Self-pay

## 2021-12-04 ENCOUNTER — Other Ambulatory Visit (HOSPITAL_COMMUNITY): Payer: Self-pay

## 2021-12-06 ENCOUNTER — Other Ambulatory Visit (HOSPITAL_COMMUNITY): Payer: Self-pay

## 2021-12-23 ENCOUNTER — Other Ambulatory Visit: Payer: Self-pay

## 2021-12-23 ENCOUNTER — Other Ambulatory Visit (HOSPITAL_COMMUNITY): Payer: Self-pay

## 2021-12-23 ENCOUNTER — Ambulatory Visit: Payer: 59 | Admitting: Nurse Practitioner

## 2021-12-23 VITALS — BP 120/73 | HR 72 | Temp 98.1°F | Ht 68.0 in | Wt 268.4 lb

## 2021-12-23 DIAGNOSIS — L739 Follicular disorder, unspecified: Secondary | ICD-10-CM

## 2021-12-23 DIAGNOSIS — T3695XA Adverse effect of unspecified systemic antibiotic, initial encounter: Secondary | ICD-10-CM

## 2021-12-23 DIAGNOSIS — B379 Candidiasis, unspecified: Secondary | ICD-10-CM

## 2021-12-23 MED ORDER — FLUCONAZOLE 150 MG PO TABS
150.0000 mg | ORAL_TABLET | Freq: Every day | ORAL | 0 refills | Status: AC
  Filled 2021-12-23: qty 1, 1d supply, fill #0

## 2021-12-23 MED ORDER — DOXYCYCLINE HYCLATE 100 MG PO CAPS
100.0000 mg | ORAL_CAPSULE | Freq: Two times a day (BID) | ORAL | 0 refills | Status: AC
  Filled 2021-12-23: qty 14, 7d supply, fill #0

## 2021-12-23 NOTE — Patient Instructions (Signed)
Hidradenitis Suppurativa Hidradenitis suppurativa is a long-term (chronic) skin disease. It is similar to a severe form of acne, but it affects areas of the body where acne would be unusual, especially areas of the body where skin rubs against skin and becomes moist. These include: Underarms. Groin. Genital area. Buttocks. Upper thighs. Breasts. Hidradenitis suppurativa may start out as small lumps or pimples caused by blocked sweat glands or hair follicles. Pimples may develop into deep sores that break open (rupture) and drain pus. Over time, affected areas of skin may thicken and become scarred. This condition is rare and does not spread from person to person (non-contagious). What are the causes? The exact cause of this condition is not known. It may be related to: Female and female hormones. An overactive disease-fighting system (immune system). The immune system may over-react to blocked hair follicles or sweat glands and cause swelling and pus-filled sores. What increases the risk? You are more likely to develop this condition if you: Are female. Are 32-90 years old. Have a family history of hidradenitis suppurativa. Have a personal history of acne. Are overweight. Smoke. Take the medicine lithium. What are the signs or symptoms? The first symptoms are usually painful bumps in the skin, similar to pimples. The condition may get worse over time (progress), or it may only cause mild symptoms. If the disease progresses, symptoms may include: Skin bumps getting bigger and growing deeper into the skin. Bumps rupturing and draining pus. Itchy, infected skin. Skin getting thicker and scarred. Tunnels under the skin (fistulas) where pus drains from a bump. Pain during daily activities, such as pain during walking if your groin area is affected. Emotional problems, such as stress or depression. This condition may affect your appearance and your ability or willingness to wear certain clothes  or do certain activities. How is this diagnosed? This condition is diagnosed by a health care provider who specializes in skin diseases (dermatologist). You may be diagnosed based on: Your symptoms and medical history. A physical exam. Testing a pus sample for infection. Blood tests. How is this treated? Your treatment will depend on how severe your symptoms are. The same treatment will not work for everybody with this condition. You may need to try several treatments to find what works best for you. Treatment may include: Cleaning and bandaging (dressing) your wounds as needed. Lifestyle changes, such as new skin care routines. Taking medicines, such as: Antibiotics. Acne medicines. Medicines to reduce the activity of the immune system. A diabetes medicine (metformin). Birth control pills, for women. Steroids to reduce swelling and pain. Working with a mental health care provider, if you experience emotional distress due to this condition. If you have severe symptoms that do not get better with medicine, you may need surgery. Surgery may involve: Using a laser to clear the skin and remove hair follicles. Opening and draining deep sores. Removing the areas of skin that are diseased and scarred. Follow these instructions at home: Medicines  Take over-the-counter and prescription medicines only as told by your health care provider. If you were prescribed an antibiotic medicine, take it as told by your health care provider. Do not stop taking the antibiotic even if your condition improves. Skin care If you have open wounds, cover them with a clean dressing as told by your health care provider. Keep wounds clean by washing them gently with soap and water when you bathe. Do not shave the areas where you get hidradenitis suppurativa. Do not wear deodorant. Wear loose-fitting  clothes. Try to avoid getting overheated or sweaty. If you get sweaty or wet, change into clean, dry clothes as soon  as you can. To help relieve pain and itchiness, cover sore areas with a warm, clean washcloth (warm compress) for 5-10 minutes as often as needed. If told by your health care provider, take a bleach bath twice a week: Fill your bathtub halfway with water. Pour in  cup of unscented household bleach. Soak in the tub for 5-10 minutes. Only soak from the neck down. Avoid water on your face and hair. Shower to rinse off the bleach from your skin. General instructions Learn as much as you can about your disease so that you have an active role in your treatment. Work closely with your health care provider to find treatments that work for you. If you are overweight, work with your health care provider to lose weight as recommended. Do not use any products that contain nicotine or tobacco, such as cigarettes and e-cigarettes. If you need help quitting, ask your health care provider. If you struggle with living with this condition, talk with your health care provider or work with a mental health care provider as recommended. Keep all follow-up visits as told by your health care provider. This is important. Where to find more information Hidradenitis [place].: https://www.hs-foundation.org/ American Academy of Dermatology: http://www.[place].com/ Contact a health care provider if you have: A flare-up of hidradenitis suppurativa. A fever or chills. Trouble controlling your symptoms at home. Trouble doing your daily activities because of your symptoms. Trouble dealing with emotional problems related to your condition. Summary Hidradenitis suppurativa is a long-term (chronic) skin disease. It is similar to a severe form of acne, but it affects areas of the body where acne would be unusual. The first symptoms are usually painful bumps in the skin, similar to pimples. The condition may only cause mild symptoms, or it may get worse over time (progress). If you have open wounds, cover them  with a clean dressing as told by your health care provider. Keep wounds clean by washing them gently with soap and water when you bathe. Besides skin care, treatment may include medicines, laser treatment, and surgery. This information is not intended to replace advice given to you by your health care provider. Make sure you discuss any questions you have with your health care provider. Document Revised: 08/11/2020 Document Reviewed: 08/11/2020 Elsevier Patient Education  2022 [employer].

## 2021-12-23 NOTE — Progress Notes (Signed)
[place], [place]  [address] Phone:  [phone]   Fax:  [phone]   Established Patient Office Visit  Subjective:  Patient ID: Megan Mitchell, female    DOB: [birth year]  Age: 51 y.o. MRN: [id number]  CC:  Chief Complaint  Patient presents with   Follow-up    Pt is here for 4 week follow up visit. Pt stated she has two bumps under her right arm she would like to get looked at    HPI Megan Mitchell presents for follow up. She . has a past medical history of Allergy, Anemia, Anxiety, Arthritis, Fibroids, Hypertension, and Ovarian cyst.   She is in today for follow-up for weight management.  She is currently prescribed phentermine 37.5 mg daily.  She reports that she is fasting most days.  She has not had a significant blood loss.  Our scales indicates that she has gained 3 to 4 pounds however all appointments are in the afternoon.  She is concerned that her weight may be related to menopause.  She is status post hysterectomy.  She reports being interested in injectable weight loss therapy Kirke Shaggy was previously discussed.  Patient complains of a subcutaneous nodule located over the upper arm.  This has been present for several days.  There has been discharge, swelling, and pain.  Patient does have a history of epidermal inclusion cysts.  She endorses shaving because she dislikes hair.  Past Medical History:  Diagnosis Date   Allergy    seasonal   Anemia    Anxiety    Arthritis    left knee   Fibroids    Hypertension    Ovarian cyst     Past Surgical History:  Procedure Laterality Date   CHOLECYSTECTOMY     COLONOSCOPY     ELBOW SURGERY     ROBOTIC ASSISTED TOTAL HYSTERECTOMY WITH BILATERAL SALPINGO OOPHERECTOMY Bilateral 05/11/2021   Procedure: XI ROBOTIC ASSISTED TOTAL HYSTERECTOMY GREATER THAN 250 GRAMS WITH BILATERAL SALPINGO OOPHORECTOMY;MINI LAPAROTOMY FOR CYST DECOMPRESSION;OMENTECTOMY;  Surgeon: Everitt Amber, MD;   Location: [facility];  Service: Gynecology;  Laterality: Bilateral;   TUBAL LIGATION      Family History  Problem Relation Age of Onset   Hypertension Mother    Colon cancer Neg Hx    Colon polyps Neg Hx    Esophageal cancer Neg Hx    Rectal cancer Neg Hx    Stomach cancer Neg Hx     Social History   Socioeconomic History   Marital status: Married    Spouse name: Not on file   Number of children: 2   Years of education: Not on file   Highest education level: Not on file  Occupational History   Not on file  Tobacco Use   Smoking status: Never   Smokeless tobacco: Never  Vaping Use   Vaping Use: Never used  Substance and Sexual Activity   Alcohol use: No   Drug use: No   Sexual activity: Yes    Birth control/protection: Surgical  Other Topics Concern   Not on file  Social History Narrative   Not on file   Social Determinants of Health   Financial Resource Strain: Not on file  Food Insecurity: Not on file  Transportation Needs: Not on file  Physical Activity: Not on file  Stress: Not on file  Social Connections: Not on file  Intimate Partner Violence: Not on file    Outpatient Medications  Prior to Visit  Medication Sig Dispense Refill   acetaminophen (TYLENOL) 500 MG tablet Take 1,000 mg by mouth every 6 (six) hours as needed for moderate pain.     amLODipine (NORVASC) 10 MG tablet TAKE 1 TABLET BY MOUTH DAILY. 90 tablet 3   Beclomethasone Dipropionate (QNASL) 80 MCG/ACT AERS Place 1 spray into the nose daily. 10.6 g 1   Ginger, Zingiber officinalis, (GINGER PO) Take by mouth.     ibuprofen (ADVIL) 800 MG tablet Take 1 tablet (800 mg total) by mouth every 8 (eight) hours as needed for moderate pain. For AFTER surgery only 30 tablet 0   losartan (COZAAR) 50 MG tablet TAKE 1 TABLET BY MOUTH ONCE DAILY 90 tablet 3   Rhubarb (ESTROVERA PO) Take by mouth. Take one daily.     triamterene-hydrochlorothiazide (MAXZIDE-25) 37.5-25 MG tablet TAKE 1 TABLET BY MOUTH DAILY. 90  tablet 3   phentermine (ADIPEX-P) 37.5 MG tablet Take 1 tablet (37.5 mg total) by mouth daily before breakfast. 30 tablet 0   No facility-administered medications prior to visit.    Allergies  Allergen Reactions   Sulfa Antibiotics Rash    Makes mouth break out with a rash    ROS Review of Systems  Neurological:  Positive for dizziness (once on yesterday).     Objective:    Physical Exam Constitutional:      General: She is not in acute distress.    Appearance: She is obese.  HENT:     Head: Normocephalic.  Cardiovascular:     Rate and Rhythm: Normal rate.     Pulses: Normal pulses.  Pulmonary:     Effort: Pulmonary effort is normal.  Musculoskeletal:        General: Normal range of motion.     Cervical back: Normal range of motion.  Skin:    General: Skin is warm.     Capillary Refill: Capillary refill takes less than 2 seconds.  Neurological:     General: No focal deficit present.     Mental Status: She is alert.  Psychiatric:        Mood and Affect: Mood normal.        Behavior: Behavior normal.        Thought Content: Thought content normal.        Judgment: Judgment normal.    BP 120/73    Pulse 72    Temp 98.1 F (36.7 C)    Ht '5\' 8"'  (1.727 m)    Wt 268 lb 6 oz (121.7 kg)    SpO2 100%    BMI 40.81 kg/m  Wt Readings from Last 3 Encounters:  12/23/21 268 lb 6 oz (121.7 kg)  11/25/21 262 lb 12.8 oz (119.2 kg)  10/14/21 265 lb 6 oz (120.4 kg)     Health Maintenance Due  Topic Date Due   COVID-19 Vaccine (4 - Booster for Pfizer series) 10/16/2020    There are no preventive care reminders to display for this patient.  Lab Results  Component Value Date   TSH 1.680 03/24/2020   Lab Results  Component Value Date   WBC 7.7 09/13/2021   HGB 11.6 09/13/2021   HCT 36.3 09/13/2021   MCV 74 (L) 09/13/2021   PLT 547 (H) 09/13/2021   Lab Results  Component Value Date   NA 137 09/13/2021   K 3.5 09/13/2021   CO2 23 05/11/2021   GLUCOSE 81 09/13/2021    BUN 13 09/13/2021   CREATININE 0.74  09/13/2021   BILITOT 0.3 09/13/2021   ALKPHOS 119 09/13/2021   AST 20 09/13/2021   ALT 9 05/04/2021   PROT 8.1 09/13/2021   ALBUMIN 4.8 09/13/2021   CALCIUM 10.0 09/13/2021   ANIONGAP 13 05/11/2021   EGFR 97 09/13/2021   Lab Results  Component Value Date   CHOL 204 (H) 03/24/2020   Lab Results  Component Value Date   HDL 67 03/24/2020   Lab Results  Component Value Date   LDLCALC 125 (H) 03/24/2020   Lab Results  Component Value Date   TRIG 68 03/24/2020   Lab Results  Component Value Date   CHOLHDL 3.0 03/24/2020   Lab Results  Component Value Date   HGBA1C 5.5 11/04/2020      Assessment & Plan:   Problem List Items Addressed This Visit   None Visit Diagnoses     Folliculitis of right axilla    -  Primary Recurrent Discussed hydradenitis    Relevant Medications   doxycycline (VIBRAMYCIN) 100 MG capsule   Other Relevant Orders   WOUND CULTURE   Antibiotic-induced yeast infection       Relevant Medications   fluconazole (DIFLUCAN) 150 MG tablet       Meds ordered this encounter  Medications   doxycycline (VIBRAMYCIN) 100 MG capsule    Sig: Take 1 capsule by mouth 2 times daily for 7 days.    Dispense:  14 capsule    Refill:  0    Order Specific Question:   Supervising Provider    Answer:   Tresa Garter [[id number]]   fluconazole (DIFLUCAN) 150 MG tablet    Sig: Take 1 tablet by mouth once    Dispense:  1 tablet    Refill:  0    Order Specific Question:   Supervising Provider    Answer:   Tresa Garter [[id number]]    Follow-up: Return in about 6 weeks (around 02/03/2022) for Follow-up for weight management [id number].    Vevelyn Francois, NP

## 2021-12-24 ENCOUNTER — Encounter: Payer: Self-pay | Admitting: Nurse Practitioner

## 2021-12-24 DIAGNOSIS — L739 Follicular disorder, unspecified: Secondary | ICD-10-CM | POA: Diagnosis not present

## 2021-12-29 LAB — WOUND CULTURE

## 2022-01-18 ENCOUNTER — Other Ambulatory Visit (HOSPITAL_COMMUNITY): Payer: Self-pay

## 2022-01-18 ENCOUNTER — Other Ambulatory Visit: Payer: Self-pay

## 2022-01-18 ENCOUNTER — Ambulatory Visit
Admission: RE | Admit: 2022-01-18 | Discharge: 2022-01-18 | Disposition: A | Discharge status: Home / Self Care | Payer: 59 | Source: Ambulatory Visit | Attending: Nurse Practitioner | Admitting: Nurse Practitioner

## 2022-01-18 DIAGNOSIS — Z1231 Encounter for screening mammogram for malignant neoplasm of breast: Secondary | ICD-10-CM | POA: Diagnosis not present

## 2022-01-18 DIAGNOSIS — Z1239 Encounter for other screening for malignant neoplasm of breast: Secondary | ICD-10-CM

## 2022-01-20 ENCOUNTER — Other Ambulatory Visit (HOSPITAL_COMMUNITY): Payer: Self-pay

## 2022-01-24 ENCOUNTER — Telehealth: Payer: Self-pay | Admitting: Nurse Practitioner

## 2022-01-24 NOTE — Telephone Encounter (Signed)
Pt called saying she is really stopped up. She is requesting for some type of medicine, recommendation, or advice on what to do to make it go away.  ?

## 2022-01-27 ENCOUNTER — Ambulatory Visit: Payer: 59 | Admitting: Nurse Practitioner

## 2022-01-27 ENCOUNTER — Telehealth: Payer: Self-pay

## 2022-01-27 ENCOUNTER — Encounter: Payer: Self-pay | Admitting: Nurse Practitioner

## 2022-01-27 ENCOUNTER — Other Ambulatory Visit (HOSPITAL_COMMUNITY): Payer: Self-pay

## 2022-01-27 VITALS — BP 114/75 | HR 79 | Temp 98.1°F | Ht 68.0 in | Wt 271.6 lb

## 2022-01-27 DIAGNOSIS — J069 Acute upper respiratory infection, unspecified: Secondary | ICD-10-CM

## 2022-01-27 MED ORDER — PREDNISONE 20 MG PO TABS
20.0000 mg | ORAL_TABLET | Freq: Every day | ORAL | 0 refills | Status: AC
Start: 2022-01-27 — End: 2022-02-01
  Filled 2022-01-27: qty 5, 5d supply, fill #0

## 2022-01-27 MED ORDER — FLUTICASONE PROPIONATE 50 MCG/ACT NA SUSP
2.0000 | Freq: Every day | NASAL | 6 refills | Status: DC
  Filled 2022-01-27: qty 16, 30d supply, fill #0
  Filled 2022-08-15: qty 16, 30d supply, fill #1
  Filled 2022-09-28 – 2022-10-11 (×2): qty 16, 30d supply, fill #2
  Filled 2022-12-10: qty 16, 30d supply, fill #3
  Filled 2023-01-11: qty 16, 30d supply, fill #4

## 2022-01-27 NOTE — Patient Instructions (Addendum)
1. Upper respiratory tract infection, unspecified type ? ?- predniSONE (DELTASONE) 20 MG tablet; Take 1 tablet (20 mg total) by mouth daily with breakfast for 5 days.  Dispense: 5 tablet; Refill: 0 ?- fluticasone (FLONASE) 50 MCG/ACT nasal spray; Place 2 sprays into both nostrils daily.  Dispense: 16 g; Refill: 6 ? ?Stay well hydrated ? ?Stay active ? ?Deep breathing exercises ? ?May take tylenol or fever or pain ? ?Olfactory retraining using essential oils ? ? ? ?Follow up: ? ?Follow up in 2 weeks or sooner if needed ? ? ? ? ? ? ?

## 2022-01-27 NOTE — Telephone Encounter (Signed)
Fentymine for weight loss ? ?

## 2022-01-27 NOTE — Progress Notes (Signed)
$'@Patient'V$  ID: Megan Mitchell, female    DOB: 20-Oct-1971, 51 y.o.   MRN: [id number]  Chief Complaint  Patient presents with   head congestion    Patient states that last week she developed head congestion. Patient past symptoms- runny nose and drainage, loss some of her sense of smell, sinus pressure, bodyaches, chills, fever, facial tightness, productive cough.  Patient states that he smell has not been the same since last week and she has done a Covid test already and it was negative.    Referring provider: Vevelyn Francois, NP   HPI  Patient presents today for sick visit.  Patient states that last week she developed head congestion. Symptoms have included runny nose and drainage, loss some of her sense of smell, sinus pressure, bodyaches, chills, fever, facial tightness, productive cough.  Patient states that he smell has not been the same since last week and she has taken a Covid test already and it was negative. She reports that she has taken Robitussin / decongestant with minimal relief noted. Denies f/c/s, n/v/d, hemoptysis, PND, leg swelling Denies chest pain or edema            Allergies  Allergen Reactions   Sulfa Antibiotics Rash    Makes mouth break out with a rash    Immunization History  Administered Date(s) Administered   Influenza Inj Mdck Quad Pf 06/25/2019   Influenza,inj,Quad PF,6+ Mos 06/22/2018, 07/14/2020   Influenza-Unspecified 07/31/2016   PFIZER(Purple Top)SARS-COV-2 Vaccination 01/01/2020, 01/22/2020, 08/21/2020   Tdap 06/22/2018   Zoster Recombinat (Shingrix) 03/26/2020, 04/28/2020    Past Medical History:  Diagnosis Date   Allergy    seasonal   Anemia    Anxiety    Arthritis    left knee   Fibroids    Hypertension    Ovarian cyst     Tobacco History: Social History   Tobacco Use  Smoking Status Never  Smokeless Tobacco Never   Counseling given: Not Answered   Outpatient Encounter Medications as of 01/27/2022  Medication Sig    amLODipine (NORVASC) 10 MG tablet TAKE 1 TABLET BY MOUTH DAILY.   fluticasone (FLONASE) 50 MCG/ACT nasal spray Place 2 sprays into both nostrils daily.   Ginger, Zingiber officinalis, (GINGER PO) Take by mouth.   [EXPIRED] predniSONE (DELTASONE) 20 MG tablet Take 1 tablet by mouth daily with breakfast for 5 days.   triamterene-hydrochlorothiazide (MAXZIDE-25) 37.5-25 MG tablet TAKE 1 TABLET BY MOUTH DAILY.   [DISCONTINUED] acetaminophen (TYLENOL) 500 MG tablet Take 1,000 mg by mouth every 6 (six) hours as needed for moderate pain.   [DISCONTINUED] ibuprofen (ADVIL) 800 MG tablet Take 1 tablet (800 mg total) by mouth every 8 (eight) hours as needed for moderate pain. For AFTER surgery only (Patient not taking: Reported on 02/03/2022)   [DISCONTINUED] Rhubarb (ESTROVERA PO) Take by mouth. Take one daily.   losartan (COZAAR) 50 MG tablet TAKE 1 TABLET BY MOUTH ONCE DAILY   [DISCONTINUED] Beclomethasone Dipropionate (QNASL) 80 MCG/ACT AERS Place 1 spray into the nose daily.   [DISCONTINUED] pantoprazole (PROTONIX) 40 MG tablet TAKE 1 TABLET (40 MG TOTAL) BY MOUTH DAILY.   [DISCONTINUED] phentermine (ADIPEX-P) 37.5 MG tablet Take 1 tablet (37.5 mg total) by mouth daily before breakfast.   [DISCONTINUED] potassium chloride SA (KLOR-CON) 20 MEQ tablet Take 1 tablet (20 mEq total) by mouth daily. (Patient not taking: Reported on 01/19/2021)   No facility-administered encounter medications on file as of 01/27/2022.     Review of Systems  Review of Systems  Constitutional: Negative.   HENT:  Positive for congestion, postnasal drip and sinus pain.   Respiratory:  Positive for cough.   Cardiovascular: Negative.   Gastrointestinal: Negative.   Allergic/Immunologic: Negative.   Neurological: Negative.   Psychiatric/Behavioral: Negative.         Physical Exam  BP 114/75   Pulse 79   Temp 98.1 F (36.7 C)   Ht '5\' 8"'$  (1.727 m)   Wt 271 lb 9.6 oz (123.2 kg)   SpO2 100%   BMI 41.30 kg/m   Wt  Readings from Last 5 Encounters:  06/30/22 276 lb (125.2 kg)  06/09/22 280 lb (127 kg)  05/04/22 276 lb (125.2 kg)  04/06/22 278 lb (126.1 kg)  03/23/22 275 lb (124.7 kg)     Physical Exam Vitals and nursing note reviewed.  Constitutional:      General: She is not in acute distress.    Appearance: She is well-developed.  HENT:     Nose: Congestion present.  Cardiovascular:     Rate and Rhythm: Normal rate and regular rhythm.  Pulmonary:     Effort: Pulmonary effort is normal.     Breath sounds: Normal breath sounds.  Neurological:     Mental Status: She is alert and oriented to person, place, and time.      Lab Results:  CBC    Component Value Date/Time   WBC 6.4 02/17/2022 0902   WBC 9.9 05/11/2021 1258   RBC 4.68 02/17/2022 0902   RBC 4.34 05/11/2021 1258   HGB 11.7 02/17/2022 0902   HCT 36.0 02/17/2022 0902   PLT 476 (H) 02/17/2022 0902   MCV 77 (L) 02/17/2022 0902   MCH 25.0 (L) 02/17/2022 0902   MCH 23.5 (L) 05/11/2021 1258   MCHC 32.5 02/17/2022 0902   MCHC 30.4 05/11/2021 1258   RDW 14.8 02/17/2022 0902   LYMPHSABS 2.0 02/17/2022 0902   MONOABS 0.4 05/11/2021 1258   EOSABS 0.2 02/17/2022 0902   BASOSABS 0.1 02/17/2022 0902    BMET    Component Value Date/Time   NA 140 02/17/2022 0902   K 4.1 02/17/2022 0902   CL 101 02/17/2022 0902   CO2 23 02/17/2022 0902   GLUCOSE 99 02/17/2022 0902   GLUCOSE 110 (H) 05/11/2021 1258   BUN 12 02/17/2022 0902   CREATININE 0.77 02/17/2022 0902   CREATININE 0.74 04/22/2015 1840   CALCIUM 10.4 (H) 02/17/2022 0902   GFRNONAA 59 (L) 05/11/2021 1258   GFRAA 82 11/04/2020 1122    BNP No results found for: "BNP"  ProBNP No results found for: "PROBNP"  Imaging: No results found.   Assessment & Plan:   Urinary tract infection    Upper respiratory tract infection - predniSONE (DELTASONE) 20 MG tablet; Take 1 tablet (20 mg total) by mouth daily with breakfast for 5 days.  Dispense: 5 tablet; Refill: 0 -  fluticasone (FLONASE) 50 MCG/ACT nasal spray; Place 2 sprays into both nostrils daily.  Dispense: 16 g; Refill: 6  Stay well hydrated  Stay active  Deep breathing exercises  May take tylenol or fever or pain  Olfactory retraining using essential oils    Follow up:  Follow up in 2 weeks or sooner if needed     Fenton Foy, NP 07/27/2022

## 2022-01-31 DIAGNOSIS — Z1211 Encounter for screening for malignant neoplasm of colon: Secondary | ICD-10-CM | POA: Diagnosis not present

## 2022-01-31 DIAGNOSIS — Z01419 Encounter for gynecological examination (general) (routine) without abnormal findings: Secondary | ICD-10-CM | POA: Diagnosis not present

## 2022-01-31 DIAGNOSIS — Z6841 Body Mass Index (BMI) 40.0 and over, adult: Secondary | ICD-10-CM | POA: Diagnosis not present

## 2022-01-31 DIAGNOSIS — Z1239 Encounter for other screening for malignant neoplasm of breast: Secondary | ICD-10-CM | POA: Diagnosis not present

## 2022-01-31 DIAGNOSIS — Z90711 Acquired absence of uterus with remaining cervical stump: Secondary | ICD-10-CM | POA: Diagnosis not present

## 2022-02-03 ENCOUNTER — Ambulatory Visit: Payer: 59 | Admitting: Nurse Practitioner

## 2022-02-03 ENCOUNTER — Encounter: Payer: Self-pay | Admitting: Nurse Practitioner

## 2022-02-03 DIAGNOSIS — Z7689 Persons encountering health services in other specified circumstances: Secondary | ICD-10-CM

## 2022-02-03 DIAGNOSIS — R232 Flushing: Secondary | ICD-10-CM | POA: Diagnosis not present

## 2022-02-03 NOTE — Progress Notes (Signed)
? ?[address] ?[place], [place]  [address] ?Phone:  [phone]   Fax:  [phone] ?Subjective:  ? Patient ID: Megan Mitchell, female    DOB: 02/19/1971, 51 y.o.   MRN: [id number] ? ?Chief Complaint  ?Patient presents with  ? Weight Loss  ?  Patient is here today for her weight management follow up.    ? ?HPI ?Megan Mitchell 51 y.o. female  has a past medical history of Allergy, Anemia, Anxiety, Arthritis, Fibroids, Hypertension, and Ovarian cyst. To the [facility] for weight management. ? ?States that she has a weight loss goal of 15-20. Has been taking prescribed phentermine with no weight loss, but weight gain. Currently on the waiting list for weight loss management clinic, uncertain of when she may be able to get an appointment. Denies adhering to any diet and/ exercise regimen.  ? ?Also concerned about hot flashes, had hysterectomy June 2022. Denies any other concerns today. ? ?Denies any fatigue, chest pain, shortness of breath, HA or dizziness. Denies any blurred vision, numbness or tingling. ? ? ?Past Medical History:  ?Diagnosis Date  ? Allergy   ? seasonal  ? Anemia   ? Anxiety   ? Arthritis   ? left knee  ? Fibroids   ? Hypertension   ? Ovarian cyst   ? ? ?Past Surgical History:  ?Procedure Laterality Date  ? CHOLECYSTECTOMY    ? COLONOSCOPY    ? ELBOW SURGERY    ? ROBOTIC ASSISTED TOTAL HYSTERECTOMY WITH BILATERAL SALPINGO OOPHERECTOMY Bilateral 05/11/2021  ? Procedure: XI ROBOTIC ASSISTED TOTAL HYSTERECTOMY GREATER THAN 250 GRAMS WITH BILATERAL SALPINGO OOPHORECTOMY;MINI LAPAROTOMY FOR CYST DECOMPRESSION;OMENTECTOMY;  Surgeon: Everitt Amber, MD;  Location: [facility];  Service: Gynecology;  Laterality: Bilateral;  ? TUBAL LIGATION    ? ? ?Family History  ?Problem Relation Age of Onset  ? Hypertension Mother   ? Colon polyps Neg Hx   ? Esophageal cancer Neg Hx   ? Rectal cancer Neg Hx   ? Stomach cancer Neg Hx   ? Breast cancer Neg Hx   ? Colon cancer Neg Hx   ? ? ?Social  History  ? ?Socioeconomic History  ? Marital status: Married  ?  Spouse name: Not on file  ? Number of children: 2  ? Years of education: Not on file  ? Highest education level: Not on file  ?Occupational History  ? Not on file  ?Tobacco Use  ? Smoking status: Never  ? Smokeless tobacco: Never  ?Vaping Use  ? Vaping Use: Never used  ?Substance and Sexual Activity  ? Alcohol use: No  ? Drug use: No  ? Sexual activity: Yes  ?  Birth control/protection: Surgical  ?Other Topics Concern  ? Not on file  ?Social History Narrative  ? Not on file  ? ?Social Determinants of Health  ? ?Financial Resource Strain: Not on file  ?Food Insecurity: Not on file  ?Transportation Needs: Not on file  ?Physical Activity: Not on file  ?Stress: Not on file  ?Social Connections: Not on file  ?Intimate Partner Violence: Not on file  ? ? ?Outpatient Medications Prior to Visit  ?Medication Sig Dispense Refill  ? acetaminophen (TYLENOL) 500 MG tablet Take 1,000 mg by mouth every 6 (six) hours as needed for moderate pain.    ? amLODipine (NORVASC) 10 MG tablet TAKE 1 TABLET BY MOUTH DAILY. 90 tablet 3  ? fluticasone (FLONASE) 50 MCG/ACT nasal spray Place 2 sprays  into both nostrils daily. 16 g 6  ? Ginger, Zingiber officinalis, (GINGER PO) Take by mouth.    ? Rhubarb (ESTROVERA PO) Take by mouth. Take one daily.    ? triamterene-hydrochlorothiazide (MAXZIDE-25) 37.5-25 MG tablet TAKE 1 TABLET BY MOUTH DAILY. 90 tablet 3  ? Beclomethasone Dipropionate (QNASL) 80 MCG/ACT AERS Place 1 spray into the nose daily. 10.6 g 1  ? ibuprofen (ADVIL) 800 MG tablet Take 1 tablet (800 mg total) by mouth every 8 (eight) hours as needed for moderate pain. For AFTER surgery only (Patient not taking: Reported on 02/03/2022) 30 tablet 0  ? losartan (COZAAR) 50 MG tablet TAKE 1 TABLET BY MOUTH ONCE DAILY (Patient not taking: Reported on 01/27/2022) 90 tablet 3  ? phentermine (ADIPEX-P) 37.5 MG tablet Take 1 tablet (37.5 mg total) by mouth daily before breakfast. 30  tablet 0  ? ?No facility-administered medications prior to visit.  ? ? ?Allergies  ?Allergen Reactions  ? Sulfa Antibiotics Rash  ?  Makes mouth break out with a rash  ? ? ?Review of Systems  ?Constitutional:  Negative for chills, fever and malaise/fatigue.  ?     See HPI  ?Respiratory:  Negative for cough and shortness of breath.   ?Cardiovascular:  Negative for chest pain, palpitations and leg swelling.  ?Gastrointestinal:  Negative for abdominal pain, blood in stool, constipation, diarrhea, nausea and vomiting.  ?Musculoskeletal: Negative.   ?Skin: Negative.   ?Neurological: Negative.   ?Psychiatric/Behavioral:  Negative for depression. The patient is not nervous/anxious.   ?All other systems reviewed and are negative. ? ?   ?Objective:  ?  ?Physical Exam ?Vitals reviewed.  ?Constitutional:   ?   General: She is not in acute distress. ?   Appearance: Normal appearance. She is obese.  ?HENT:  ?   Head: Normocephalic.  ?Cardiovascular:  ?   Rate and Rhythm: Normal rate and regular rhythm.  ?   Pulses: Normal pulses.  ?   Heart sounds: Normal heart sounds.  ?   Comments: No obvious peripheral edema ?Pulmonary:  ?   Effort: Pulmonary effort is normal.  ?   Breath sounds: Normal breath sounds.  ?Musculoskeletal:     ?   General: No swelling, tenderness, deformity or signs of injury. Normal range of motion.  ?   Right lower leg: No edema.  ?   Left lower leg: No edema.  ?Skin: ?   General: Skin is warm and dry.  ?   Capillary Refill: Capillary refill takes less than 2 seconds.  ?Neurological:  ?   General: No focal deficit present.  ?   Mental Status: She is alert and oriented to person, place, and time.  ?Psychiatric:     ?   Mood and Affect: Mood normal.     ?   Behavior: Behavior normal.     ?   Thought Content: Thought content normal.     ?   Judgment: Judgment normal.  ? ? ?BP 111/67   Pulse 79   Temp 98.3 ?F (36.8 ?C)   Ht 5' 8" (1.727 m)   Wt 275 lb 12.8 oz (125.1 kg)   SpO2 100%   BMI 41.94 kg/m?  ?Wt  Readings from Last 3 Encounters:  ?02/03/22 275 lb 12.8 oz (125.1 kg)  ?01/27/22 271 lb 9.6 oz (123.2 kg)  ?12/23/21 268 lb 6 oz (121.7 kg)  ? ? ?Immunization History  ?Administered Date(s) Administered  ? Influenza Inj Mdck Quad Pf 06/25/2019  ?  Influenza,inj,Quad PF,6+ Mos 06/22/2018, 07/14/2020  ? Influenza-Unspecified 07/31/2016  ? PFIZER(Purple Top)SARS-COV-2 Vaccination 01/01/2020, 01/22/2020, 08/21/2020  ? Tdap 06/22/2018  ? Zoster Recombinat (Shingrix) 03/26/2020, 04/28/2020  ? ? ?Diabetic Foot Exam - Simple   ?No data filed ?  ? ? ?Lab Results  ?Component Value Date  ? TSH 1.580 02/03/2022  ? ?Lab Results  ?Component Value Date  ? WBC 7.7 09/13/2021  ? HGB 11.6 09/13/2021  ? HCT 36.3 09/13/2021  ? MCV 74 (L) 09/13/2021  ? PLT 547 (H) 09/13/2021  ? ?Lab Results  ?Component Value Date  ? NA 137 09/13/2021  ? K 3.5 09/13/2021  ? CO2 23 05/11/2021  ? GLUCOSE 81 09/13/2021  ? BUN 13 09/13/2021  ? CREATININE 0.74 09/13/2021  ? BILITOT 0.3 09/13/2021  ? ALKPHOS 119 09/13/2021  ? AST 20 09/13/2021  ? ALT 9 05/04/2021  ? PROT 8.1 09/13/2021  ? ALBUMIN 4.8 09/13/2021  ? CALCIUM 10.0 09/13/2021  ? ANIONGAP 13 05/11/2021  ? EGFR 97 09/13/2021  ? ?Lab Results  ?Component Value Date  ? CHOL 204 (H) 03/24/2020  ? CHOL 228 (H) 10/04/2019  ? CHOL 217 (H) 01/11/2019  ? ?Lab Results  ?Component Value Date  ? HDL 67 03/24/2020  ? HDL 67 10/04/2019  ? HDL 58 01/11/2019  ? ?Lab Results  ?Component Value Date  ? LDLCALC 125 (H) 03/24/2020  ? LDLCALC 146 (H) 10/04/2019  ? LDLCALC 141 (H) 01/11/2019  ? ?Lab Results  ?Component Value Date  ? TRIG 68 03/24/2020  ? TRIG 86 10/04/2019  ? TRIG 90 01/11/2019  ? ?Lab Results  ?Component Value Date  ? CHOLHDL 3.0 03/24/2020  ? CHOLHDL 3.4 10/04/2019  ? CHOLHDL 3.7 01/11/2019  ? ?Lab Results  ?Component Value Date  ? HGBA1C 5.5 11/04/2020  ? HGBA1C 5.6 03/24/2020  ? HGBA1C 5.8 (H) 10/04/2019  ? ? ?   ?Assessment & Plan:  ? ?Problem List Items Addressed This Visit   ? ?  ? Other  ? Severe  obesity (BMI >= 40) (HCC) - Primary  ? Relevant Orders  ? TSH+T4F+T3Free (Completed) ?Discussed diet and exercise options at length   ? ?Other Visit Diagnoses   ? ? Encounter for weight management     ?Discussed diet

## 2022-02-03 NOTE — Patient Instructions (Signed)
You were seen today in the [facility] for weight management. Labs were collected, results will be available via MyChart or, if abnormal, you will be contacted by clinic staff. Please follow up in 5 wks for weight management. ?

## 2022-02-04 DIAGNOSIS — Z0289 Encounter for other administrative examinations: Secondary | ICD-10-CM

## 2022-02-09 ENCOUNTER — Telehealth: Payer: Self-pay

## 2022-02-09 NOTE — Telephone Encounter (Signed)
Please forward to [facility]. It looks like she ordered these labs. Thanks. ?

## 2022-02-10 LAB — TSH+T4F+T3FREE
Free T4: 1.38 ng/dL (ref 0.82–1.77)
T3, Free: 3.7 pg/mL (ref 2.0–4.4)
TSH: 1.58 u[IU]/mL (ref 0.450–4.500)

## 2022-02-10 LAB — ESTROGENS, TOTAL: Estrogen: 44 pg/mL

## 2022-02-16 ENCOUNTER — Ambulatory Visit: Payer: Self-pay | Admitting: Nurse Practitioner

## 2022-02-17 ENCOUNTER — Other Ambulatory Visit (HOSPITAL_COMMUNITY): Payer: Self-pay

## 2022-02-17 ENCOUNTER — Ambulatory Visit (INDEPENDENT_AMBULATORY_CARE_PROVIDER_SITE_OTHER): Payer: 59 | Admitting: Family Medicine

## 2022-02-17 ENCOUNTER — Encounter (INDEPENDENT_AMBULATORY_CARE_PROVIDER_SITE_OTHER): Payer: Self-pay | Admitting: Family Medicine

## 2022-02-17 ENCOUNTER — Telehealth: Payer: Self-pay

## 2022-02-17 VITALS — BP 115/76 | HR 74 | Temp 97.6°F | Ht 68.0 in | Wt 273.0 lb

## 2022-02-17 DIAGNOSIS — D75839 Thrombocytosis, unspecified: Secondary | ICD-10-CM | POA: Diagnosis not present

## 2022-02-17 DIAGNOSIS — E669 Obesity, unspecified: Secondary | ICD-10-CM

## 2022-02-17 DIAGNOSIS — Z6841 Body Mass Index (BMI) 40.0 and over, adult: Secondary | ICD-10-CM

## 2022-02-17 DIAGNOSIS — R5383 Other fatigue: Secondary | ICD-10-CM | POA: Diagnosis not present

## 2022-02-17 DIAGNOSIS — E7800 Pure hypercholesterolemia, unspecified: Secondary | ICD-10-CM | POA: Diagnosis not present

## 2022-02-17 DIAGNOSIS — R7303 Prediabetes: Secondary | ICD-10-CM

## 2022-02-17 DIAGNOSIS — R0602 Shortness of breath: Secondary | ICD-10-CM | POA: Diagnosis not present

## 2022-02-17 DIAGNOSIS — D509 Iron deficiency anemia, unspecified: Secondary | ICD-10-CM

## 2022-02-17 DIAGNOSIS — E66813 Obesity, class 3: Secondary | ICD-10-CM

## 2022-02-17 DIAGNOSIS — Z1331 Encounter for screening for depression: Secondary | ICD-10-CM

## 2022-02-17 DIAGNOSIS — I10 Essential (primary) hypertension: Secondary | ICD-10-CM | POA: Diagnosis not present

## 2022-02-17 DIAGNOSIS — Z9189 Other specified personal risk factors, not elsewhere classified: Secondary | ICD-10-CM

## 2022-02-17 NOTE — Telephone Encounter (Signed)
Pt called in and ask about her LOW estrogen and needs a call back from you!  ?

## 2022-02-18 LAB — CBC WITH DIFFERENTIAL/PLATELET
Basophils Absolute: 0.1 10*3/uL (ref 0.0–0.2)
Basos: 1 %
EOS (ABSOLUTE): 0.2 10*3/uL (ref 0.0–0.4)
Eos: 3 %
Hemoglobin: 11.7 g/dL (ref 11.1–15.9)
Immature Grans (Abs): 0 10*3/uL (ref 0.0–0.1)
Immature Granulocytes: 0 %
Lymphocytes Absolute: 2 10*3/uL (ref 0.7–3.1)
Lymphs: 32 %
MCH: 25 pg — ABNORMAL LOW (ref 26.6–33.0)
MCHC: 32.5 g/dL (ref 31.5–35.7)
MCV: 77 fL — ABNORMAL LOW (ref 79–97)
Monocytes Absolute: 0.3 10*3/uL (ref 0.1–0.9)
Monocytes: 5 %
Neutrophils Absolute: 3.8 10*3/uL (ref 1.4–7.0)
Neutrophils: 59 %
Platelets: 476 10*3/uL — ABNORMAL HIGH (ref 150–450)
RBC: 4.68 x10E6/uL (ref 3.77–5.28)
RDW: 14.8 % (ref 11.7–15.4)
WBC: 6.4 10*3/uL (ref 3.4–10.8)

## 2022-02-18 LAB — VITAMIN D 25 HYDROXY (VIT D DEFICIENCY, FRACTURES): Vit D, 25-Hydroxy: 30.7 ng/mL (ref 30.0–100.0)

## 2022-02-18 LAB — COMPREHENSIVE METABOLIC PANEL
ALT: 14 IU/L (ref 0–32)
AST: 19 IU/L (ref 0–40)
Albumin/Globulin Ratio: 1.3 (ref 1.2–2.2)
Albumin: 4.3 g/dL (ref 3.8–4.9)
Alkaline Phosphatase: 99 IU/L (ref 44–121)
BUN/Creatinine Ratio: 16 (ref 9–23)
BUN: 12 mg/dL (ref 6–24)
Bilirubin Total: 0.4 mg/dL (ref 0.0–1.2)
CO2: 23 mmol/L (ref 20–29)
Calcium: 10.4 mg/dL — ABNORMAL HIGH (ref 8.7–10.2)
Chloride: 101 mmol/L (ref 96–106)
Creatinine, Ser: 0.77 mg/dL (ref 0.57–1.00)
Globulin, Total: 3.4 g/dL (ref 1.5–4.5)
Glucose: 99 mg/dL (ref 70–99)
Potassium: 4.1 mmol/L (ref 3.5–5.2)
Sodium: 140 mmol/L (ref 134–144)
Total Protein: 7.7 g/dL (ref 6.0–8.5)
eGFR: 93 mL/min/{1.73_m2} (ref >59)

## 2022-02-18 LAB — FOLATE: Folate: 5.9 ng/mL (ref >3.0)

## 2022-02-18 LAB — LIPID PANEL
Chol/HDL Ratio: 3.2 ratio (ref 0.0–4.4)
Cholesterol, Total: 231 mg/dL — ABNORMAL HIGH (ref 100–199)
HDL: 72 mg/dL (ref >39)
LDL Chol Calc (NIH): 146 mg/dL — ABNORMAL HIGH (ref 0–99)
Triglycerides: 76 mg/dL (ref 0–149)
VLDL Cholesterol Cal: 13 mg/dL (ref 5–40)

## 2022-02-18 LAB — HEMOGLOBIN A1C
Est. average glucose Bld gHb Est-mCnc: 120 mg/dL
Hgb A1c MFr Bld: 5.8 % — ABNORMAL HIGH (ref 4.8–5.6)

## 2022-02-18 LAB — ANEMIA PANEL
Ferritin: 42 ng/mL (ref 15–150)
Folate, Hemolysate: 316 ng/mL
Folate, RBC: 878 ng/mL (ref >498)
Hematocrit: 36 % (ref 34.0–46.6)
Iron Saturation: 10 % — ABNORMAL LOW (ref 15–55)
Iron: 45 ug/dL (ref 27–159)
Retic Ct Pct: 1.9 % (ref 0.6–2.6)
Total Iron Binding Capacity: 445 ug/dL (ref 250–450)
UIBC: 400 ug/dL (ref 131–425)
Vitamin B-12: 908 pg/mL (ref 232–1245)

## 2022-02-18 LAB — INSULIN, RANDOM: INSULIN: 28.8 u[IU]/mL — ABNORMAL HIGH (ref 2.6–24.9)

## 2022-02-28 ENCOUNTER — Other Ambulatory Visit (HOSPITAL_COMMUNITY): Payer: Self-pay

## 2022-03-01 ENCOUNTER — Encounter: Payer: Self-pay | Admitting: Physician Assistant

## 2022-03-01 ENCOUNTER — Ambulatory Visit: Payer: 59 | Admitting: Physician Assistant

## 2022-03-01 ENCOUNTER — Other Ambulatory Visit (HOSPITAL_COMMUNITY): Payer: Self-pay

## 2022-03-01 VITALS — BP 120/80 | HR 73 | Temp 98.0°F | Ht 67.5 in | Wt 281.0 lb

## 2022-03-01 DIAGNOSIS — M545 Low back pain, unspecified: Secondary | ICD-10-CM | POA: Diagnosis not present

## 2022-03-01 DIAGNOSIS — G8929 Other chronic pain: Secondary | ICD-10-CM

## 2022-03-01 MED ORDER — MELOXICAM 15 MG PO TABS
15.0000 mg | ORAL_TABLET | Freq: Every day | ORAL | 0 refills | Status: DC
  Filled 2022-03-01: qty 30, 30d supply, fill #0
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
## 2022-03-01 NOTE — Patient Instructions (Signed)
It was great to see you! ? ?Back pain: ?Exercises ?Trial daily mobic/meloxicam 15 mg daily x 2 weeks; then as needed. Avoid ibuprofen/advil while on this medication. ?If no better, let me know ? ?[facility] with [facility]! ? ?Let's follow-up as needed ? ?Take care, ? ?Inda Coke PA-C  ?

## 2022-03-02 ENCOUNTER — Other Ambulatory Visit (HOSPITAL_COMMUNITY): Payer: Self-pay

## 2022-03-03 NOTE — Progress Notes (Signed)
Chief Complaint:   OBESITY Megan Mitchell (MR# [id number]) is a 51 y.o. female who presents for evaluation and treatment of obesity and related comorbidities. Current BMI is Body mass index is 41.51 kg/m. Megan Mitchell has been struggling with her weight for many years and has been unsuccessful in either losing weight, maintaining weight loss, or reaching her healthy weight goal  Megan Mitchell was referred by prior primary care physician . 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 She is eating out 3-4 times per week at supper. She travels quite a bit as her husband is retired. She eats 2 boiled eggs and 2 pieces of [place] bacon and coffee with coke zero. (Satisfied). Lunch she has [place] tuna, lettuce, cheese, [place] dressing (water). For snack raisins and peanut butter crackers/nots. Snack after lunch Sun chips, small slice pound cake. Dinner is salmon 2.5 oz, shrimp 1/2 cup, brown rice 2 cups, green beans (satisfied). After dinner shortbread cookies./Premier shake.   Megan Mitchell is currently in the action stage of change and ready to dedicate time achieving and maintaining a healthier weight. Megan Mitchell is interested in becoming our patient and working on intensive lifestyle modifications including (but not limited to) diet and exercise for weight loss.  Megan Mitchell's habits were reviewed today and are as follows: Her family eats meals together, she thinks her family will eat healthier with her, her desired weight loss is 23-33 pounds, she has been heavy most of her life, she started gaining weight in my first marriage, her heaviest weight ever was 285 pounds, she has significant food cravings issues, she snacks frequently in the evenings, and she skips meals frequently.  Depression Screen [name]'s Food and Mood (modified PHQ-9) score was 2.     02/17/2022    7:37 AM  Depression screen PHQ 2/9  Decreased Interest 0  Down, Depressed, Hopeless 0  PHQ - 2 Score 0  Altered sleeping 0  Tired, decreased  energy 1  Change in appetite 0  Feeling bad or failure about yourself  0  Trouble concentrating 1  Moving slowly or fidgety/restless 0  Suicidal thoughts 0  PHQ-9 Score 2  Difficult doing work/chores Not difficult at all   Subjective:   1. Other fatigue Aaryn admits to daytime somnolence and admits to waking up still tired. Patient has a history of symptoms of daytime fatigue. Megan Mitchell generally gets 6 hours of sleep per night, and states that she has difficulty falling asleep and generally restful sleep. Snoring is present. Apneic episodes is present. Epworth Sleepiness Score is 3.  Megan Mitchell's EKG showed normal sinus rhythm at 65 bpm.  2. SOBOE (shortness of breath on exertion) Megan Mitchell notes increasing shortness of breath with exercising and seems to be worsening over time with weight gain. She notes getting out of breath sooner with activity than she used to. This has not gotten worse recently. Megan Mitchell denies shortness of breath at rest or orthopnea.   3. Primary hypertension Megan Mitchell is currently on amlodipine, irbesartan, triamterene-HCTZ. Her EKG showed normal sinus rhythm at 65 bpm.  4. Prediabetes Megan Mitchell's last A1C was 5.5. She is not on medications (never has been). She was diagnosed 3 years ago.   5. Pure hypercholesterolemia Megan Mitchell's LDL was elevated in the past. Her HDL was within normal limits. She was not on medications and has never been on medications.   6. Microcytic anemia Megan Mitchell's MCV was 74. Hemoglobin and hematocrit was within normal limits. Megan Mitchell stated in the past she took iron.  7. Thrombocytosis [name] has a history of significant elevated pHs.  She could be due to iron deficiency but will need further investing.   8. At risk for deficient intake of food The patient is at a higher than average risk of deficient intake of food due to not eating enough.   Assessment/Plan:   1. Other fatigue Megan Mitchell does feel that her weight is causing her energy to be lower than it should  be. Fatigue may be related to obesity, depression or many other causes. Labs will be ordered, and in the meanwhile, [name] will focus on self care including making healthy food choices, increasing physical activity and focusing on stress reduction. We reviewed EKG and will check IC and labs today.   - EKG 12-Lead - VITAMIN D 25 Hydroxy (Vit-D Deficiency, Fractures)  2. SOBOE (shortness of breath on exertion) Megan Mitchell does feel that she gets out of breath more easily that she used to when she exercises. Megan Mitchell's shortness of breath appears to be obesity related and exercise induced. She has agreed to work on weight loss and gradually increase exercise to treat her exercise induced shortness of breath. Will continue to monitor closely.   3. Primary hypertension Megan Mitchell is working on healthy weight loss and exercise to improve blood pressure control. We will check CMP today. We will watch for signs of hypotension as she continues her lifestyle modifications.  - Comprehensive metabolic panel  4. Prediabetes Megan Mitchell will continue to work on weight loss, exercise, and decreasing simple carbohydrates to help decrease the risk of diabetes. Megan Mitchell will continue to work on weight loss, exercise, and decreasing simple carbohydrates to help decrease the risk of diabetes. We will check A1C and insulin level today.   - Hemoglobin A1c - Insulin, random  5. Pure hypercholesterolemia Cardiovascular risk and specific lipid/LDL goals reviewed.  We will check lipid panel today. We discussed several lifestyle modifications today and Megan Mitchell will continue to work on diet, exercise and weight loss efforts. Orders and follow up as documented in patient record.   Counseling Intensive lifestyle modifications are the first line treatment for this issue. Dietary changes: Increase soluble fiber. Decrease simple carbohydrates. Exercise changes: Moderate to vigorous-intensity aerobic activity 150 minutes per week if  tolerated. Lipid-lowering medications: see documented in medical record.  - Lipid panel  6. Microcytic anemia We will check CBC and  anemia panel today.   - Vitamin B12 - Folate - Anemia panel  7. Thrombocytosis We will check CBCD today.   - CBC with Differential/Platelet  8. Depression screening Zabdi had a negative depression screening. Depression is commonly associated with obesity and often results in emotional eating behaviors. We will monitor this closely and work on CBT to help improve the non-hunger eating patterns. Referral to Psychology may be required if no improvement is seen as she continues in our clinic.   9. At risk for deficient intake of food Indyah was given approximately 15 minutes of deficient intake of food prevention counseling today. Ahrianna is at risk for eating too few calories based on current food recall. She was encouraged to focus on meeting caloric and protein goals according to her recommended meal plan.  10. Obesity, current BMI 41.5 Shanyia is currently in the action stage of change and her goal is to continue with weight loss efforts. I recommend Brenae begin the structured treatment plan as follows:  She has agreed to the Category 3 Plan plus 100 calories.  Exercise goals: No exercise has been prescribed at  this time.   Behavioral modification strategies: increasing lean protein intake, meal planning and cooking strategies, and keeping healthy foods in the home.  She was informed of the importance of frequent follow-up visits to maximize her success with intensive lifestyle modifications for her multiple health conditions. She was informed we would discuss her lab results at her next visit unless there is a critical issue that needs to be addressed sooner. Alizon agreed to keep her next visit at the agreed upon time to discuss these results.  Objective:   Blood pressure 115/76, pulse 74, temperature 97.6 F (36.4 C), height '5\' 8"'$  (1.727 m), weight 273  lb (123.8 kg), SpO2 100 %. Body mass index is 41.51 kg/m.  EKG: Normal sinus rhythm, rate 65 bpm.  Indirect Calorimeter completed today shows a VO2 of 282 and a REE of 1944.  Her calculated basal metabolic rate is 7408 thus her basal metabolic rate is worse than expected.  General: Cooperative, alert, well developed, in no acute distress. HEENT: Conjunctivae and lids unremarkable. Cardiovascular: Regular rhythm.  Lungs: Normal work of breathing. Neurologic: No focal deficits.   Lab Results  Component Value Date   CREATININE 0.77 02/17/2022   BUN 12 02/17/2022   NA 140 02/17/2022   K 4.1 02/17/2022   CL 101 02/17/2022   CO2 23 02/17/2022   Lab Results  Component Value Date   ALT 14 02/17/2022   AST 19 02/17/2022   ALKPHOS 99 02/17/2022   BILITOT 0.4 02/17/2022   Lab Results  Component Value Date   HGBA1C 5.8 (H) 02/17/2022   HGBA1C 5.5 11/04/2020   HGBA1C 5.6 03/24/2020   HGBA1C 5.8 (H) 10/04/2019   HGBA1C 5.6 11/30/2018   Lab Results  Component Value Date   INSULIN 28.8 (H) 02/17/2022   Lab Results  Component Value Date   TSH 1.580 02/03/2022   Lab Results  Component Value Date   CHOL 231 (H) 02/17/2022   HDL 72 02/17/2022   LDLCALC 146 (H) 02/17/2022   TRIG 76 02/17/2022   CHOLHDL 3.2 02/17/2022   Lab Results  Component Value Date   WBC 6.4 02/17/2022   HGB 11.7 02/17/2022   HCT 36.0 02/17/2022   MCV 77 (L) 02/17/2022   PLT 476 (H) 02/17/2022   Lab Results  Component Value Date   IRON 45 02/17/2022   TIBC 445 02/17/2022   FERRITIN 42 02/17/2022   Attestation Statements:   Reviewed by clinician on day of visit: allergies, medications, problem list, medical history, surgical history, family history, social history, and previous encounter notes.  I, Lizbeth Bark, RMA, am acting as Location manager for Coralie Common, MD. This is the patient's first visit at [facility]. The patient's NEW PATIENT PACKET was reviewed at length.  Included in the packet: current and past health history, medications, allergies, ROS, gynecologic history (women only), surgical history, family history, social history, weight history, weight loss surgery history (for those that have had weight loss surgery), nutritional evaluation, mood and food questionnaire, PHQ9, Epworth questionnaire, sleep habits questionnaire, patient life and health improvement goals questionnaire. These will all be scanned into the patient's chart under media.   During the visit, I independently reviewed the patient's EKG, bioimpedance scale results, and indirect calorimeter results. I used this information to tailor a meal plan for the patient that will help her to lose weight and will improve her obesity-related conditions going forward. I performed a medically necessary appropriate examination and/or evaluation. I discussed the assessment and treatment  plan with the patient. The patient was provided an opportunity to ask questions and all were answered. The patient agreed with the plan and demonstrated an understanding of the instructions. Labs were ordered at this visit and will be reviewed at the next visit unless more critical results need to be addressed immediately. Clinical information was updated and documented in the EMR.   Time spent on visit including pre-visit chart review and post-visit care was 45 minutes.   A separate 15 minutes was spent on risk counseling (see above).   I have reviewed the above documentation for accuracy and completeness, and I agree with the above. - Coralie Common, MD

## 2022-03-07 ENCOUNTER — Encounter: Payer: Self-pay | Admitting: Physician Assistant

## 2022-03-08 ENCOUNTER — Ambulatory Visit (INDEPENDENT_AMBULATORY_CARE_PROVIDER_SITE_OTHER): Payer: 59 | Admitting: Family Medicine

## 2022-03-11 ENCOUNTER — Ambulatory Visit: Payer: Self-pay | Admitting: Nurse Practitioner

## 2022-03-15 ENCOUNTER — Ambulatory Visit (INDEPENDENT_AMBULATORY_CARE_PROVIDER_SITE_OTHER): Payer: 59 | Admitting: Family Medicine

## 2022-03-21 ENCOUNTER — Other Ambulatory Visit (HOSPITAL_COMMUNITY): Payer: Self-pay

## 2022-03-21 DIAGNOSIS — R399 Unspecified symptoms and signs involving the genitourinary system: Secondary | ICD-10-CM | POA: Diagnosis not present

## 2022-03-21 MED ORDER — NITROFURANTOIN MONOHYD MACRO 100 MG PO CAPS
ORAL_CAPSULE | ORAL | 0 refills | Status: DC
  Filled 2022-03-21: qty 10, 5d supply, fill #0

## 2022-03-22 ENCOUNTER — Other Ambulatory Visit (HOSPITAL_COMMUNITY): Payer: Self-pay

## 2022-03-23 ENCOUNTER — Other Ambulatory Visit (HOSPITAL_COMMUNITY): Payer: Self-pay

## 2022-03-23 ENCOUNTER — Encounter (INDEPENDENT_AMBULATORY_CARE_PROVIDER_SITE_OTHER): Payer: Self-pay | Admitting: Family Medicine

## 2022-03-23 ENCOUNTER — Ambulatory Visit (INDEPENDENT_AMBULATORY_CARE_PROVIDER_SITE_OTHER): Payer: 59 | Admitting: Family Medicine

## 2022-03-23 VITALS — BP 104/64 | HR 97 | Temp 98.1°F | Ht 68.0 in | Wt 275.0 lb

## 2022-03-23 DIAGNOSIS — E559 Vitamin D deficiency, unspecified: Secondary | ICD-10-CM

## 2022-03-23 DIAGNOSIS — E7849 Other hyperlipidemia: Secondary | ICD-10-CM | POA: Diagnosis not present

## 2022-03-23 DIAGNOSIS — R7303 Prediabetes: Secondary | ICD-10-CM

## 2022-03-23 DIAGNOSIS — Z9189 Other specified personal risk factors, not elsewhere classified: Secondary | ICD-10-CM

## 2022-03-23 DIAGNOSIS — I10 Essential (primary) hypertension: Secondary | ICD-10-CM | POA: Diagnosis not present

## 2022-03-23 DIAGNOSIS — E669 Obesity, unspecified: Secondary | ICD-10-CM | POA: Diagnosis not present

## 2022-03-23 DIAGNOSIS — Z6841 Body Mass Index (BMI) 40.0 and over, adult: Secondary | ICD-10-CM

## 2022-03-23 MED ORDER — VITAMIN D (ERGOCALCIFEROL) 1.25 MG (50000 UNIT) PO CAPS
50000.0000 [IU] | ORAL_CAPSULE | ORAL | 0 refills | Status: DC
  Filled 2022-03-23: qty 4, 28d supply, fill #0

## 2022-03-30 NOTE — Progress Notes (Signed)
Chief Complaint:   OBESITY Megan Mitchell is here to discuss her progress with her obesity treatment plan along with follow-up of her obesity related diagnoses. Megan Mitchell is on the Category 3 Plan + 100 and states she is following her eating plan approximately 75% of the time. Megan Mitchell states she is walking 3-4 miles 5 times per week.  Today's visit was #: 2 Starting weight: 273 lbs Starting date: 02/17/2022 Today's weight: 275 lbs Today's date: 03/23/2022 Total lbs lost to date: 2 Total lbs lost since last in-office visit: 2  Interim History: Megan Mitchell voices that she has been very busy since initial appointment- son graduated from [employer], family vacation and seeing her Fil. She tries to be mindful of food choices when not at home. She has gotten back on track since initial appointment. She has no plans for the next few weeks.   Subjective:   1. Essential hypertension Lakeisha's blood pressure is well controled today. She is currently taking Amlodipine, Losartan, Maxzide. Denies chest pain, chest pressure and headache.  2. Other hyperlipidemia We discussed labs today. Anni's 72yrASCVD risk 1.5% (Optimal 1.3%). Total 231, LDL 146, HDL 72, Trigly 76.   3. Vitamin D deficiency We discussed labs today. Megan Mitchell is currently taking prescription Vit D 50,000 IU once a week. Denies any nausea, vomiting or muscle weakness. She notes fatigue.  4. Prediabetes We discussed labs today. A1c at 5.8, insulin 28.8. Megan Mitchell recognizes that she may have been eating more indulgently then she thought.  5. At risk of diabetes mellitus Megan Mitchell is at higher than average risk for developing diabetes due to obesity.   Assessment/Plan:   1. Essential hypertension We will follow up with blood pressure at next appointment, will need medication adjustment if blood pressure stays this controlled.  2. Other hyperlipidemia We will repeat FLP in 3 months. Lifestyle changes discussed.  3. Vitamin D deficiency Ellean will START Vit D  50,000 IU once a week for 1 month with no refills.  -START Vitamin D, Ergocalciferol, (DRISDOL) 1.25 MG (50000 UNIT) CAPS capsule; Take 1 capsule by mouth every 7 days.  Dispense: 4 capsule; Refill: 0  4. Prediabetes Pathophysiology of IR and Prediabetes discussed no medication at this time. If huger or cravings increases, medication will be discussed.  5. At risk of diabetes mellitus Megan Mitchell was given approximately 15 minutes of diabetic education and counseling today. We discussed intensive lifestyle modifications today with an emphasis on weight loss as well as increasing exercise and decreasing simple carbohydrates in her diet. We also reviewed medication options with an emphasis on risk versus benefits of those discussed.  Repetitive spaced learning was employed today to elicit superior memory formation and behavioral change.  6. Obesity, current BMI 41.9 Zannie is currently in the action stage of change. As such, her goal is to continue with weight loss efforts. She has agreed to the Category 3 Plan + 100 daily.  Exercise goals: As is.  Behavioral modification strategies: increasing lean protein intake, meal planning and cooking strategies, keeping healthy foods in the home, and planning for success.  Megan Mitchell has agreed to follow-up with our clinic in 3 weeks. She was informed of the importance of frequent follow-up visits to maximize her success with intensive lifestyle modifications for her multiple health conditions.   Objective:   Blood pressure 104/64, pulse 97, temperature 98.1 F (36.7 C), height '5\' 8"'$  (1.727 m), weight 275 lb (124.7 kg), last menstrual period 06/14/2020, SpO2 98 %. Body mass index is  41.81 kg/m.  General: Cooperative, alert, well developed, in no acute distress. HEENT: Conjunctivae and lids unremarkable. Cardiovascular: Regular rhythm.  Lungs: Normal work of breathing. Neurologic: No focal deficits.   Lab Results  Component Value Date   CREATININE 0.77  02/17/2022   BUN 12 02/17/2022   NA 140 02/17/2022   K 4.1 02/17/2022   CL 101 02/17/2022   CO2 23 02/17/2022   Lab Results  Component Value Date   ALT 14 02/17/2022   AST 19 02/17/2022   ALKPHOS 99 02/17/2022   BILITOT 0.4 02/17/2022   Lab Results  Component Value Date   HGBA1C 5.8 (H) 02/17/2022   HGBA1C 5.5 11/04/2020   HGBA1C 5.6 03/24/2020   HGBA1C 5.8 (H) 10/04/2019   HGBA1C 5.6 11/30/2018   Lab Results  Component Value Date   INSULIN 28.8 (H) 02/17/2022   Lab Results  Component Value Date   TSH 1.580 02/03/2022   Lab Results  Component Value Date   CHOL 231 (H) 02/17/2022   HDL 72 02/17/2022   LDLCALC 146 (H) 02/17/2022   TRIG 76 02/17/2022   CHOLHDL 3.2 02/17/2022   Lab Results  Component Value Date   VD25OH 30.7 02/17/2022   Lab Results  Component Value Date   WBC 6.4 02/17/2022   HGB 11.7 02/17/2022   HCT 36.0 02/17/2022   MCV 77 (L) 02/17/2022   PLT 476 (H) 02/17/2022   Lab Results  Component Value Date   IRON 45 02/17/2022   TIBC 445 02/17/2022   FERRITIN 42 02/17/2022   Attestation Statements:   Reviewed by clinician on day of visit: allergies, medications, problem list, medical history, surgical history, family history, social history, and previous encounter notes.  I, Elnora Morrison, RMA am acting as transcriptionist for Coralie Common, MD.  I have reviewed the above documentation for accuracy and completeness, and I agree with the above. - Coralie Common, MD

## 2022-04-06 ENCOUNTER — Other Ambulatory Visit (HOSPITAL_COMMUNITY): Payer: Self-pay

## 2022-04-06 ENCOUNTER — Ambulatory Visit (INDEPENDENT_AMBULATORY_CARE_PROVIDER_SITE_OTHER): Payer: 59 | Admitting: Family Medicine

## 2022-04-06 ENCOUNTER — Encounter (INDEPENDENT_AMBULATORY_CARE_PROVIDER_SITE_OTHER): Payer: Self-pay | Admitting: Family Medicine

## 2022-04-06 VITALS — BP 106/72 | HR 72 | Temp 98.4°F | Ht 68.0 in | Wt 278.0 lb

## 2022-04-06 DIAGNOSIS — E669 Obesity, unspecified: Secondary | ICD-10-CM

## 2022-04-06 DIAGNOSIS — I10 Essential (primary) hypertension: Secondary | ICD-10-CM | POA: Diagnosis not present

## 2022-04-06 DIAGNOSIS — E559 Vitamin D deficiency, unspecified: Secondary | ICD-10-CM | POA: Diagnosis not present

## 2022-04-06 DIAGNOSIS — Z9189 Other specified personal risk factors, not elsewhere classified: Secondary | ICD-10-CM

## 2022-04-06 DIAGNOSIS — E66813 Obesity, class 3: Secondary | ICD-10-CM

## 2022-04-06 DIAGNOSIS — Z6841 Body Mass Index (BMI) 40.0 and over, adult: Secondary | ICD-10-CM

## 2022-04-06 MED ORDER — VITAMIN D (ERGOCALCIFEROL) 1.25 MG (50000 UNIT) PO CAPS
50000.0000 [IU] | ORAL_CAPSULE | ORAL | 0 refills | Status: DC
  Filled 2022-04-06: qty 4, 28d supply, fill #0

## 2022-04-07 ENCOUNTER — Other Ambulatory Visit (HOSPITAL_COMMUNITY): Payer: Self-pay

## 2022-04-11 NOTE — Progress Notes (Unsigned)
Chief Complaint:   OBESITY Megan Mitchell is here to discuss her progress with her obesity treatment plan along with follow-up of her obesity related diagnoses. Megan Mitchell is on the Category 3 Plan+ 100 and states she is following her eating plan approximately 80% of the time. Megan Mitchell states she is walking 1-2 miles 4 times per week.  Today's visit was #: 3 Starting weight: 273 lbs Starting date: 02/17/2022 Today's weight: 278 lbs Today's date: 04/06/2022 Total lbs lost to date: 0 lbs Total lbs lost since last in-office visit: 0  Interim History: Megan Mitchell has been traveling quite a bit- went to [facility] and [place]. She does report some indulgent days. 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 Sometimes she weighs and measures quantity of meat and vegtables.  Subjective:   1. Vitamin D deficiency Megan Mitchell is currently taking prescription Vit D 50,000 IU once a week. Her last Vit D level of 30.7.  2. Essential hypertension Megan Mitchell's blood pressure is very well controlled.Denies chest pain, chest pressure and headache.  3. At risk for deficient intake of food The patient is at a higher than average risk of deficient intake of food due to not weighing or measuring food intake.  Assessment/Plan:   1. Vitamin D deficiency We will refill Vit D IU once daily for 1 month with 0 refills.  -Refill Vitamin D, Ergocalciferol, (DRISDOL) 1.25 MG (50000 UNIT) CAPS capsule; Take 1 capsule by mouth every 7 days.  Dispense: 4 capsule; Refill: 0  2. Essential hypertension Megan Mitchell will continue taking Cozaar and  current Maxzide dose with no changes.  3. At risk for deficient intake of food Megan Mitchell was given approximately 15 minutes of deficient intake of food prevention counseling today. Megan Mitchell is at risk for eating too few calories based on current food recall. She was encouraged to focus on meeting caloric and protein goals according to her recommended meal plan.  4. Obesity, current BMI 42.3 Megan Mitchell is currently in the  action stage of change. As such, her goal is to continue with weight loss efforts. She has agreed to the Category 3 Plan + 100.  Exercise goals: All adults should avoid inactivity. Some physical activity is better than none, and adults who participate in any amount of physical activity gain some health benefits.  Behavioral modification strategies: increasing lean protein intake, meal planning and cooking strategies, keeping healthy foods in the home, and planning for success.  Megan Mitchell has agreed to follow-up with our clinic in 3 weeks. She was informed of the importance of frequent follow-up visits to maximize her success with intensive lifestyle modifications for her multiple health conditions.   Objective:   Blood pressure 106/72, pulse 72, temperature 98.4 F (36.9 C), height '5\' 8"'$  (1.727 m), weight 278 lb (126.1 kg), last menstrual period 06/14/2020, SpO2 100 %. Body mass index is 42.27 kg/m.  General: Cooperative, alert, well developed, in no acute distress. HEENT: Conjunctivae and lids unremarkable. Cardiovascular: Regular rhythm.  Lungs: Normal work of breathing. Neurologic: No focal deficits.   Lab Results  Component Value Date   CREATININE 0.77 02/17/2022   BUN 12 02/17/2022   NA 140 02/17/2022   K 4.1 02/17/2022   CL 101 02/17/2022   CO2 23 02/17/2022   Lab Results  Component Value Date   ALT 14 02/17/2022   AST 19 02/17/2022   ALKPHOS 99 02/17/2022   BILITOT 0.4 02/17/2022   Lab Results  Component Value Date   HGBA1C 5.8 (H) 02/17/2022   HGBA1C  5.5 11/04/2020   HGBA1C 5.6 03/24/2020   HGBA1C 5.8 (H) 10/04/2019   HGBA1C 5.6 11/30/2018   Lab Results  Component Value Date   INSULIN 28.8 (H) 02/17/2022   Lab Results  Component Value Date   TSH 1.580 02/03/2022   Lab Results  Component Value Date   CHOL 231 (H) 02/17/2022   HDL 72 02/17/2022   LDLCALC 146 (H) 02/17/2022   TRIG 76 02/17/2022   CHOLHDL 3.2 02/17/2022   Lab Results  Component Value Date    VD25OH 30.7 02/17/2022   Lab Results  Component Value Date   WBC 6.4 02/17/2022   HGB 11.7 02/17/2022   HCT 36.0 02/17/2022   MCV 77 (L) 02/17/2022   PLT 476 (H) 02/17/2022   Lab Results  Component Value Date   IRON 45 02/17/2022   TIBC 445 02/17/2022   FERRITIN 42 02/17/2022   Attestation Statements:   Reviewed by clinician on day of visit: allergies, medications, problem list, medical history, surgical history, family history, social history, and previous encounter notes.  I, Megan Mitchell, RMA am acting as transcriptionist for Megan Common, MD.  I have reviewed the above documentation for accuracy and completeness, and I agree with the above. -  ***

## 2022-04-14 ENCOUNTER — Other Ambulatory Visit (HOSPITAL_COMMUNITY): Payer: Self-pay

## 2022-04-15 ENCOUNTER — Other Ambulatory Visit (HOSPITAL_COMMUNITY): Payer: Self-pay

## 2022-04-19 ENCOUNTER — Other Ambulatory Visit (HOSPITAL_COMMUNITY): Payer: Self-pay

## 2022-04-20 ENCOUNTER — Encounter: Payer: Self-pay | Admitting: Orthopaedic Surgery

## 2022-04-20 ENCOUNTER — Ambulatory Visit: Payer: 59 | Admitting: Orthopaedic Surgery

## 2022-04-20 ENCOUNTER — Other Ambulatory Visit (HOSPITAL_COMMUNITY): Payer: Self-pay

## 2022-04-20 ENCOUNTER — Ambulatory Visit (INDEPENDENT_AMBULATORY_CARE_PROVIDER_SITE_OTHER): Payer: 59

## 2022-04-20 VITALS — Ht 68.0 in

## 2022-04-20 DIAGNOSIS — G8929 Other chronic pain: Secondary | ICD-10-CM

## 2022-04-20 DIAGNOSIS — M25562 Pain in left knee: Secondary | ICD-10-CM

## 2022-04-20 MED ORDER — BUPIVACAINE HCL 0.25 % IJ SOLN
2.0000 mL | INTRAMUSCULAR | Status: AC | PRN
  Administered 2022-04-20: 2 mL via INTRA_ARTICULAR

## 2022-04-20 MED ORDER — LIDOCAINE HCL 1 % IJ SOLN
2.0000 mL | INTRAMUSCULAR | Status: AC | PRN
  Administered 2022-04-20: 2 mL

## 2022-04-20 MED ORDER — METHYLPREDNISOLONE ACETATE 40 MG/ML IJ SUSP
80.0000 mg | INTRAMUSCULAR | Status: AC | PRN
  Administered 2022-04-20: 80 mg via INTRA_ARTICULAR

## 2022-04-20 NOTE — Progress Notes (Signed)
Office Visit Note   Patient: Megan Mitchell           Date of Birth: 1971/05/22           MRN: [id number] Visit Date: 04/20/2022              Requested by: Inda Coke, [place] [address] [place],  [place] [address] PCP: Inda Coke, [place]   Assessment & Plan: Visit Diagnoses:  1. Chronic pain of left knee   2. Acute pain of left knee     Plan: Megan Mitchell jerked her knee at work about 2 weeks ago.  She was in a mobile chair and struck the anterior aspect of her knee against her desk.  She had a second injury of the same mechanism shortly thereafter.  Since that time she has had trouble along the anterior lateral aspect of her knee to the point of compromise and difficulty walking.  X-rays demonstrated some patellofemoral arthritis and even some lateral compartment arthritis but no acute changes.  Try a local cortisone injection intra-articularly along the anterior lateral compartment and monitor response over the next several weeks.  She can still take the anti-inflammatory medicines  Follow-Up Instructions: Return if symptoms worsen or fail to improve.   Orders:  Orders Placed This Encounter  Procedures   Large Joint Inj: L knee   XR KNEE 3 VIEW LEFT   No orders of the defined types were placed in this encounter.     Procedures: Large Joint Inj: L knee on 04/20/2022 4:18 PM Indications: pain and diagnostic evaluation Details: 25 G 1.5 in needle, anteromedial approach  Arthrogram: No  Medications: 2 mL lidocaine 1 %; 80 mg methylPREDNISolone acetate 40 MG/ML; 2 mL bupivacaine 0.25 % Procedure, treatment alternatives, risks and benefits explained, specific risks discussed. Consent was given by the patient. Patient was prepped and draped in the usual sterile fashion.       Clinical Data: No additional findings.   Subjective: Chief Complaint  Patient presents with   Left Knee - Pain  Patient presents today for left knee pain. She said that she was at work  two week ago and was rolling up to her desk while sitting in her chair, when she struck her knee on a part of the desk. She has been having pain laterally. She said that it feels "tight". She has been taking Ibuprofen and icing.   HPI  Review of Systems   Objective: Vital Signs: Ht '5\' 8"'$  (1.727 m)   LMP 06/14/2020 (Approximate)   BMI 42.27 kg/m   Physical Exam Constitutional:      Appearance: She is well-developed.  Eyes:     Pupils: Pupils are equal, round, and reactive to light.  Pulmonary:     Effort: Pulmonary effort is normal.  Skin:    General: Skin is warm and dry.  Neurological:     Mental Status: She is alert and oriented to person, place, and time.  Psychiatric:        Behavior: Behavior normal.     Ortho Exam awake alert and oriented x3.  Comfortable sitting.  No use of ambulatory aid.  Does have tenderness along the anterior lateral and lateral compartments of the left knee.  Large knees.  Not sure that there is an effusion.  The knee was not warm.  Full extension with positive patella crepitation but no pain with compression.  No pain medially.  No popliteal pain or calf discomfort.  Painless range  of motion left hip.  Motor exam intact.  Does have a slight limp  Specialty Comments:  No specialty comments available.  Imaging: XR KNEE 3 VIEW LEFT  Result Date: 04/20/2022 Films of the left knee were obtained in 3 projections standing.  There degenerative changes about the patella with peripheral osteophytes in both medial lateral facets but the joint spaces are well-maintained and the patella tracks in the midline.  There are very minimal osteophytes in the lateral compartment.  No acute changes or ectopic calcification.  Alignment is neutral.  Films are consistent with some mild to moderate osteoarthritis    PMFS History: Patient Active Problem List   Diagnosis Date Noted   Pain in left knee 04/20/2022   Urinary tract infection 05/11/2021   Pelvic mass in  female 04/08/2021   Fibroids 04/08/2021   Anemia 12/26/2014   HTN (hypertension) 03/22/2014   Severe obesity (BMI >= 40) ([place]) 03/22/2014   Past Medical History:  Diagnosis Date   Allergy    seasonal   Anemia    Anxiety    Arthritis    left knee   Fibroids    Hypertension    Ovarian cyst     Family History  Problem Relation Age of Onset   Hypertension Mother    Hypertension Maternal Grandmother    Heart attack Maternal Grandfather    Hypertension Maternal Grandfather    Colon polyps Neg Hx    Esophageal cancer Neg Hx    Rectal cancer Neg Hx    Stomach cancer Neg Hx    Breast cancer Neg Hx    Colon cancer Neg Hx     Past Surgical History:  Procedure Laterality Date   CHOLECYSTECTOMY     COLONOSCOPY     ELBOW SURGERY     ROBOTIC ASSISTED TOTAL HYSTERECTOMY WITH BILATERAL SALPINGO OOPHERECTOMY Bilateral 05/11/2021   Procedure: XI ROBOTIC ASSISTED TOTAL HYSTERECTOMY GREATER THAN 250 GRAMS WITH BILATERAL SALPINGO OOPHORECTOMY;MINI LAPAROTOMY FOR CYST DECOMPRESSION;OMENTECTOMY;  Surgeon: Everitt Amber, MD;  Location: [facility];  Service: Gynecology;  Laterality: Bilateral;   TUBAL LIGATION     Social History   Occupational History   Occupation: Location manager Account Rep  Tobacco Use   Smoking status: Never   Smokeless tobacco: Never  Vaping Use   Vaping Use: Never used  Substance and Sexual Activity   Alcohol use: No   Drug use: No   Sexual activity: Yes    Birth control/protection: Surgical

## 2022-04-21 ENCOUNTER — Other Ambulatory Visit (HOSPITAL_COMMUNITY): Payer: Self-pay

## 2022-04-29 ENCOUNTER — Other Ambulatory Visit (HOSPITAL_COMMUNITY): Payer: Self-pay

## 2022-05-02 ENCOUNTER — Other Ambulatory Visit (HOSPITAL_COMMUNITY): Payer: Self-pay

## 2022-05-04 ENCOUNTER — Ambulatory Visit (INDEPENDENT_AMBULATORY_CARE_PROVIDER_SITE_OTHER): Payer: 59 | Admitting: Family Medicine

## 2022-05-04 ENCOUNTER — Encounter (INDEPENDENT_AMBULATORY_CARE_PROVIDER_SITE_OTHER): Payer: Self-pay | Admitting: Family Medicine

## 2022-05-04 ENCOUNTER — Other Ambulatory Visit (HOSPITAL_COMMUNITY): Payer: Self-pay

## 2022-05-04 VITALS — BP 117/73 | HR 68 | Temp 97.3°F | Ht 68.0 in | Wt 276.0 lb

## 2022-05-04 DIAGNOSIS — I10 Essential (primary) hypertension: Secondary | ICD-10-CM | POA: Diagnosis not present

## 2022-05-04 DIAGNOSIS — Z6841 Body Mass Index (BMI) 40.0 and over, adult: Secondary | ICD-10-CM

## 2022-05-04 DIAGNOSIS — E559 Vitamin D deficiency, unspecified: Secondary | ICD-10-CM | POA: Diagnosis not present

## 2022-05-04 DIAGNOSIS — E669 Obesity, unspecified: Secondary | ICD-10-CM | POA: Diagnosis not present

## 2022-05-04 DIAGNOSIS — R0789 Other chest pain: Secondary | ICD-10-CM | POA: Diagnosis not present

## 2022-05-04 DIAGNOSIS — R079 Chest pain, unspecified: Secondary | ICD-10-CM | POA: Diagnosis not present

## 2022-05-04 MED ORDER — VITAMIN D (ERGOCALCIFEROL) 1.25 MG (50000 UNIT) PO CAPS
50000.0000 [IU] | ORAL_CAPSULE | ORAL | 0 refills | Status: DC
  Filled 2022-05-04 – 2022-05-05 (×2): qty 4, 28d supply, fill #0

## 2022-05-05 ENCOUNTER — Other Ambulatory Visit (HOSPITAL_COMMUNITY): Payer: Self-pay

## 2022-05-05 NOTE — Progress Notes (Signed)
Chief Complaint:   OBESITY Megan Mitchell is here to discuss her progress with her obesity treatment plan along with follow-up of her obesity related diagnoses. Megan Mitchell is on the Category 3 Plan +100 and states she is following her eating plan approximately 85-90% of the time. Megan Mitchell states she is walking 30 minutes 3 times per week.  Today's visit was #: 4 Starting weight: 273 lbs Starting date: 02/17/2022 Today's weight: 276 lbs Today's date: 05/04/2022 Total lbs lost to date: 0 lbs Total lbs lost since last in-office visit: 2  Interim History: Megan Mitchell had a family gathering for her birthday at [facility]. She went to [facility] for the 4th of July. She has done well meal plan wise. She is drinking a significant amount of water and walking frequently. Wants to stick to meal plan consistently over the next few weeks. She has occasionally been hungry. She's getting close to all food in at dinner. Doing chips+salsa for snacks.  Subjective:   1. Vitamin D deficiency Megan Mitchell is currently taking prescription Vit D 50,000 IU once a week. Denies any nausea, vomiting or muscle weakness. She notes fatigue.  2. Essential hypertension Megan Mitchell's blood pressure is well managed today. She is on Amlodipine, Losartan and Triamterene-HCTZ. Denies chest pain, chest pressure and headache.  Assessment/Plan:   1. Vitamin D deficiency We will refill Vit D 50,000 IU once a week for 1 month with 0 refills.  -Refill Vitamin D, Ergocalciferol, (DRISDOL) 1.25 MG (50000 UNIT) CAPS capsule; Take 1 capsule by mouth every 7 days.  Dispense: 4 capsule; Refill: 0  2. Essential hypertension Megan Mitchell will continue on current medications without any changes in doses.  3. Obesity, current BMI 42.1 Megan Mitchell is currently in the action stage of change. As such, her goal is to continue with weight loss efforts. She has agreed to the Category 3 Plan +100.  Exercise goals: All adults should avoid inactivity. Some physical activity  is better than none, and adults who participate in any amount of physical activity gain some health benefits.  Behavioral modification strategies: increasing lean protein intake, no skipping meals, meal planning and cooking strategies, and keeping healthy foods in the home.  Megan Mitchell has agreed to follow-up with our clinic in 3 weeks. She was informed of the importance of frequent follow-up visits to maximize her success with intensive lifestyle modifications for her multiple health conditions.   Objective:   Blood pressure 117/73, pulse 68, temperature (!) 97.3 F (36.3 C), height '5\' 8"'$  (1.727 m), weight 276 lb (125.2 kg), last menstrual period 06/14/2020, SpO2 100 %. Body mass index is 41.97 kg/m.  General: Cooperative, alert, well developed, in no acute distress. HEENT: Conjunctivae and lids unremarkable. Cardiovascular: Regular rhythm.  Lungs: Normal work of breathing. Neurologic: No focal deficits.   Lab Results  Component Value Date   CREATININE 0.77 02/17/2022   BUN 12 02/17/2022   NA 140 02/17/2022   K 4.1 02/17/2022   CL 101 02/17/2022   CO2 23 02/17/2022   Lab Results  Component Value Date   ALT 14 02/17/2022   AST 19 02/17/2022   ALKPHOS 99 02/17/2022   BILITOT 0.4 02/17/2022   Lab Results  Component Value Date   HGBA1C 5.8 (H) 02/17/2022   HGBA1C 5.5 11/04/2020   HGBA1C 5.6 03/24/2020   HGBA1C 5.8 (H) 10/04/2019   HGBA1C 5.6 11/30/2018   Lab Results  Component Value Date   INSULIN 28.8 (H) 02/17/2022   Lab Results  Component Value Date  TSH 1.580 02/03/2022   Lab Results  Component Value Date   CHOL 231 (H) 02/17/2022   HDL 72 02/17/2022   LDLCALC 146 (H) 02/17/2022   TRIG 76 02/17/2022   CHOLHDL 3.2 02/17/2022   Lab Results  Component Value Date   VD25OH 30.7 02/17/2022   Lab Results  Component Value Date   WBC 6.4 02/17/2022   HGB 11.7 02/17/2022   HCT 36.0 02/17/2022   MCV 77 (L) 02/17/2022   PLT 476 (H) 02/17/2022   Lab Results   Component Value Date   IRON 45 02/17/2022   TIBC 445 02/17/2022   FERRITIN 42 02/17/2022   Attestation Statements:   Reviewed by clinician on day of visit: allergies, medications, problem list, medical history, surgical history, family history, social history, and previous encounter notes.  I, Elnora Morrison, RMA am acting as transcriptionist for Coralie Common, MD. I have reviewed the above documentation for accuracy and completeness, and I agree with the above. - Coralie Common, MD

## 2022-05-09 ENCOUNTER — Other Ambulatory Visit (HOSPITAL_COMMUNITY): Payer: Self-pay

## 2022-05-18 ENCOUNTER — Other Ambulatory Visit (HOSPITAL_COMMUNITY): Payer: Self-pay

## 2022-05-18 ENCOUNTER — Telehealth: Payer: Self-pay | Admitting: Physician Assistant

## 2022-05-18 MED ORDER — MELOXICAM 15 MG PO TABS
15.0000 mg | ORAL_TABLET | Freq: Every day | ORAL | 0 refills | Status: DC | PRN
  Filled 2022-05-18: qty 30, 30d supply, fill #0

## 2022-05-18 NOTE — Telephone Encounter (Signed)
Spoke to pt told her Rx for Meloxicam was sent to pharmacy, but if you continue to have pain need to schedule an appt. Pt verbalized understanding.

## 2022-05-18 NOTE — Telephone Encounter (Signed)
Please call pt with any questions.  LAST APPOINTMENT DATE:   03/01/22 NP with PCP  NEXT APPOINTMENT DATE: N/A  MEDICATION: meloxicam (MOBIC) 15 MG tablet [[id number]]    Is the patient out of medication?  yes  PHARMACY: [facility], [place] [address]  Phone:  [phone]  Fax:  [phone]  DEA #:  [id number]

## 2022-05-20 ENCOUNTER — Other Ambulatory Visit (HOSPITAL_COMMUNITY): Payer: Self-pay

## 2022-06-06 ENCOUNTER — Ambulatory Visit (INDEPENDENT_AMBULATORY_CARE_PROVIDER_SITE_OTHER): Payer: 59 | Admitting: Family Medicine

## 2022-06-07 ENCOUNTER — Other Ambulatory Visit (HOSPITAL_COMMUNITY): Payer: Self-pay

## 2022-06-07 MED ORDER — AZITHROMYCIN 250 MG PO TABS
ORAL_TABLET | ORAL | 0 refills | Status: DC
  Filled 2022-06-07: qty 6, 5d supply, fill #0

## 2022-06-07 MED ORDER — ACETAMINOPHEN-CODEINE 300-30 MG PO TABS
ORAL_TABLET | ORAL | 0 refills | Status: DC
  Filled 2022-06-07: qty 15, 3d supply, fill #0

## 2022-06-08 ENCOUNTER — Encounter (INDEPENDENT_AMBULATORY_CARE_PROVIDER_SITE_OTHER): Payer: Self-pay

## 2022-06-09 ENCOUNTER — Encounter (INDEPENDENT_AMBULATORY_CARE_PROVIDER_SITE_OTHER): Payer: Self-pay | Admitting: Family Medicine

## 2022-06-09 ENCOUNTER — Ambulatory Visit (INDEPENDENT_AMBULATORY_CARE_PROVIDER_SITE_OTHER): Payer: 59 | Admitting: Family Medicine

## 2022-06-09 ENCOUNTER — Other Ambulatory Visit (HOSPITAL_COMMUNITY): Payer: Self-pay

## 2022-06-09 VITALS — BP 112/72 | HR 84 | Temp 98.3°F | Ht 68.0 in | Wt 280.0 lb

## 2022-06-09 DIAGNOSIS — I10 Essential (primary) hypertension: Secondary | ICD-10-CM

## 2022-06-09 DIAGNOSIS — E559 Vitamin D deficiency, unspecified: Secondary | ICD-10-CM

## 2022-06-09 DIAGNOSIS — Z7985 Long-term (current) use of injectable non-insulin antidiabetic drugs: Secondary | ICD-10-CM

## 2022-06-09 DIAGNOSIS — E669 Obesity, unspecified: Secondary | ICD-10-CM | POA: Diagnosis not present

## 2022-06-09 DIAGNOSIS — E7849 Other hyperlipidemia: Secondary | ICD-10-CM | POA: Diagnosis not present

## 2022-06-09 DIAGNOSIS — Z6841 Body Mass Index (BMI) 40.0 and over, adult: Secondary | ICD-10-CM

## 2022-06-09 MED ORDER — VITAMIN D (ERGOCALCIFEROL) 1.25 MG (50000 UNIT) PO CAPS
50000.0000 [IU] | ORAL_CAPSULE | ORAL | 0 refills | Status: DC
  Filled 2022-06-09: qty 4, 28d supply, fill #0

## 2022-06-09 MED ORDER — WEGOVY 0.25 MG/0.5ML ~~LOC~~ SOAJ
0.2500 mg | SUBCUTANEOUS | 0 refills | Status: DC
  Filled 2022-06-09: qty 2, 28d supply, fill #0

## 2022-06-10 ENCOUNTER — Other Ambulatory Visit (HOSPITAL_COMMUNITY): Payer: Self-pay

## 2022-06-13 ENCOUNTER — Other Ambulatory Visit (HOSPITAL_COMMUNITY): Payer: Self-pay

## 2022-06-14 ENCOUNTER — Encounter (INDEPENDENT_AMBULATORY_CARE_PROVIDER_SITE_OTHER): Payer: Self-pay

## 2022-06-14 ENCOUNTER — Other Ambulatory Visit (HOSPITAL_COMMUNITY): Payer: Self-pay

## 2022-06-14 ENCOUNTER — Telehealth (INDEPENDENT_AMBULATORY_CARE_PROVIDER_SITE_OTHER): Payer: Self-pay | Admitting: Family Medicine

## 2022-06-14 NOTE — Telephone Encounter (Signed)
Dr. Jearld Shines - Prior authorization approved for [id number]. Effective: 06/14/2022 to 01/03/2023. Patient sent approval message via mychart.

## 2022-06-16 NOTE — Progress Notes (Signed)
Chief Complaint:   OBESITY Shanora is here to discuss her progress with her obesity treatment plan along with follow-up of her obesity related diagnoses. Danyle is on the Category 3 Plan +100 and states she is following her eating plan approximately 75% of the time. Sarit states she is exercising 0 minutes 0 times per week.  Today's visit was #: 5 Starting weight: 273 lbs Starting date: 02/17/2022 Today's weight: 280 lbs Today's date: 06/09/2022 Total lbs lost to date: 0 lbs Total lbs lost since last in-office visit: 0  Interim History: Lynzi is experiencing some pain in both knees-- right due to torn meniscus and left due to arthritis. Foodwise she has been sticking to meal plan and trying to limit food off meal plan. Thinks she is getting all protein in. Doing Baked Lays, popcorn, apples with caramel for snack. Breakfast is normally 2 eggs, [place] sausage links, lunch is salad with eggs, supper is chicken (grilled) and vegetables.   Subjective:   1. Essential hypertension Audree's blood pressure is well controlled. Denies chest pain, chest pressure and headache. She is currently taking Amlodipine, Cozaar, and Maxzide -25.  2. Other hyperlipidemia Hildegarde's LDL of 146, HDL of 72, and Trigly of 76. She is not taking medication.  3. Vitamin D deficiency Zondra is currently taking prescription Vit D 50,000 IU once a week. Denies any nausea, vomiting or muscle weakness. She notes fatigue.  Assessment/Plan:   1. Essential hypertension Neala will continue with currently medications. No refill given today.  2. Other hyperlipidemia We will repeat labs in Sept/Oct..  3. Vitamin D deficiency We will refill Vit D 50,000 IU once a week for 1 month with 0 refills.  -Refill Vitamin D, Ergocalciferol, (DRISDOL) 1.25 MG (50000 UNIT) CAPS capsule; Take 1 capsule by mouth every 7 days.  Dispense: 4 capsule; Refill: 0  4. Obesity with current BMI of 48.2 We will refill Wegovy 0.25 mg SubQ once  weekly for 1 month with 0 refills.  -Refill Semaglutide-Weight Management (WEGOVY) 0.25 MG/0.5ML SOAJ; Inject 0.25 mg into the skin once a week.  Dispense: 2 mL; Refill: 0  Breia is currently in the action stage of change. As such, her goal is to continue with weight loss efforts. She has agreed to the [place] +300.  Exercise goals: No exercise has been prescribed at this time. Bridney is experiencing bilateral knee pain as above.   Behavioral modification strategies: increasing lean protein intake, meal planning and cooking strategies, keeping healthy foods in the home, and planning for success.  Maidie has agreed to follow-up with our clinic in 3 weeks. She was informed of the importance of frequent follow-up visits to maximize her success with intensive lifestyle modifications for her multiple health conditions.   Objective:   Blood pressure 112/72, pulse 84, temperature 98.3 F (36.8 C), height '5\' 8"'$  (1.727 m), weight 280 lb (127 kg), last menstrual period 06/14/2020, SpO2 97 %. Body mass index is 42.57 kg/m.  General: Cooperative, alert, well developed, in no acute distress. HEENT: Conjunctivae and lids unremarkable. Cardiovascular: Regular rhythm.  Lungs: Normal work of breathing. Neurologic: No focal deficits.   Lab Results  Component Value Date   CREATININE 0.77 02/17/2022   BUN 12 02/17/2022   NA 140 02/17/2022   K 4.1 02/17/2022   CL 101 02/17/2022   CO2 23 02/17/2022   Lab Results  Component Value Date   ALT 14 02/17/2022   AST 19 02/17/2022   ALKPHOS 99 02/17/2022  BILITOT 0.4 02/17/2022   Lab Results  Component Value Date   HGBA1C 5.8 (H) 02/17/2022   HGBA1C 5.5 11/04/2020   HGBA1C 5.6 03/24/2020   HGBA1C 5.8 (H) 10/04/2019   HGBA1C 5.6 11/30/2018   Lab Results  Component Value Date   INSULIN 28.8 (H) 02/17/2022   Lab Results  Component Value Date   TSH 1.580 02/03/2022   Lab Results  Component Value Date   CHOL 231 (H) 02/17/2022   HDL  72 02/17/2022   LDLCALC 146 (H) 02/17/2022   TRIG 76 02/17/2022   CHOLHDL 3.2 02/17/2022   Lab Results  Component Value Date   VD25OH 30.7 02/17/2022   Lab Results  Component Value Date   WBC 6.4 02/17/2022   HGB 11.7 02/17/2022   HCT 36.0 02/17/2022   MCV 77 (L) 02/17/2022   PLT 476 (H) 02/17/2022   Lab Results  Component Value Date   IRON 45 02/17/2022   TIBC 445 02/17/2022   FERRITIN 42 02/17/2022   Attestation Statements:   Reviewed by clinician on day of visit: allergies, medications, problem list, medical history, surgical history, family history, social history, and previous encounter notes.  I, Elnora Morrison, RMA am acting as transcriptionist for Coralie Common, MD. I have reviewed the above documentation for accuracy and completeness, and I agree with the above. - Coralie Common, MD

## 2022-06-30 ENCOUNTER — Encounter (INDEPENDENT_AMBULATORY_CARE_PROVIDER_SITE_OTHER): Payer: Self-pay | Admitting: Family Medicine

## 2022-06-30 ENCOUNTER — Ambulatory Visit (INDEPENDENT_AMBULATORY_CARE_PROVIDER_SITE_OTHER): Payer: 59 | Admitting: Family Medicine

## 2022-06-30 ENCOUNTER — Other Ambulatory Visit (HOSPITAL_COMMUNITY): Payer: Self-pay

## 2022-06-30 VITALS — BP 109/71 | HR 91 | Temp 98.1°F | Ht 68.0 in | Wt 276.0 lb

## 2022-06-30 DIAGNOSIS — E559 Vitamin D deficiency, unspecified: Secondary | ICD-10-CM

## 2022-06-30 DIAGNOSIS — E669 Obesity, unspecified: Secondary | ICD-10-CM

## 2022-06-30 DIAGNOSIS — Z6841 Body Mass Index (BMI) 40.0 and over, adult: Secondary | ICD-10-CM

## 2022-06-30 DIAGNOSIS — E7849 Other hyperlipidemia: Secondary | ICD-10-CM

## 2022-06-30 MED ORDER — WEGOVY 0.25 MG/0.5ML ~~LOC~~ SOAJ
0.2500 mg | SUBCUTANEOUS | 0 refills | Status: DC
  Filled 2022-06-30: qty 2, 28d supply, fill #0

## 2022-06-30 MED ORDER — VITAMIN D (ERGOCALCIFEROL) 1.25 MG (50000 UNIT) PO CAPS
50000.0000 [IU] | ORAL_CAPSULE | ORAL | 0 refills | Status: DC
  Filled 2022-06-30: qty 4, 28d supply, fill #0

## 2022-07-01 ENCOUNTER — Other Ambulatory Visit (HOSPITAL_COMMUNITY): Payer: Self-pay

## 2022-07-05 ENCOUNTER — Other Ambulatory Visit (HOSPITAL_COMMUNITY): Payer: Self-pay

## 2022-07-06 ENCOUNTER — Other Ambulatory Visit (HOSPITAL_COMMUNITY): Payer: Self-pay

## 2022-07-06 NOTE — Progress Notes (Signed)
Chief Complaint:   OBESITY Megan Mitchell is here to discuss her progress with her obesity treatment plan along with follow-up of her obesity related diagnoses. Megan Mitchell is on the [place] +300 and states she is following her eating plan approximately 85% of the time. Megan Mitchell states she is walking 30 minutes 4 times per week.  Today's visit was #: 5 Starting weight: 273 lbs Starting date: 02/17/2022 Today's weight: 276 lbs Today's date: 06/30/2022 Total lbs lost to date: 0 lbs Total lbs lost since last in-office visit: 4  Interim History: Megan Mitchell started [facility] since her last appointment. Has noticed significant improvement I cravings since starting Wegovy. Breakfast-2 boiled eggs, 2 [place] sausage. Lunch--is soup or salad with chicken breast and supper is meat and vegetables. She is planning a trip to [place] in September.  Subjective:   1. Vitamin D deficiency Megan Mitchell is currently taking prescription Vit D 50,000 IU once a week, but last Vit D level of 30.7. She notes fatigue.  2. Other hyperlipidemia Megan Mitchell's last LDL 146, HDL 72 and Trigly 76. She is not on medication.  Assessment/Plan:   1. Vitamin D deficiency We will refill Vit D 50k IU once a week for 1 month with 0 refills.  -Refill Vitamin D, Ergocalciferol, (DRISDOL) 1.25 MG (50000 UNIT) CAPS capsule; Take 1 capsule by mouth every 7 days.  Dispense: 4 capsule; Refill: 0  2. Other hyperlipidemia Will repeat labs in Sept/Oct.  3. Obesity with current BMI of 42.1 We will refill Wegovy 0.25 mg SubQ once weekly for 1 month with 0 refills.  -Refill Semaglutide-Weight Management (WEGOVY) 0.25 MG/0.5ML SOAJ; Inject 0.25 mg into the skin once a week.  Dispense: 2 mL; Refill: 0  Megan Mitchell is currently in the action stage of change. As such, her goal is to continue with weight loss efforts. She has agreed to the [employer]  Exercise goals: All adults should avoid inactivity. Some physical activity is better than none, and adults  who participate in any amount of physical activity gain some health benefits.  Behavioral modification strategies: increasing lean protein intake, meal planning and cooking strategies, keeping healthy foods in the home, and planning for success.  Megan Mitchell has agreed to follow-up with our clinic in 3 weeks. She was informed of the importance of frequent follow-up visits to maximize her success with intensive lifestyle modifications for her multiple health conditions.   Objective:   Blood pressure 109/71, pulse 91, temperature 98.1 F (36.7 C), height '5\' 8"'$  (1.727 m), weight 276 lb (125.2 kg), last menstrual period 06/14/2020, SpO2 96 %. Body mass index is 41.97 kg/m.  General: Cooperative, alert, well developed, in no acute distress. HEENT: Conjunctivae and lids unremarkable. Cardiovascular: Regular rhythm.  Lungs: Normal work of breathing. Neurologic: No focal deficits.   Lab Results  Component Value Date   CREATININE 0.77 02/17/2022   BUN 12 02/17/2022   NA 140 02/17/2022   K 4.1 02/17/2022   CL 101 02/17/2022   CO2 23 02/17/2022   Lab Results  Component Value Date   ALT 14 02/17/2022   AST 19 02/17/2022   ALKPHOS 99 02/17/2022   BILITOT 0.4 02/17/2022   Lab Results  Component Value Date   HGBA1C 5.8 (H) 02/17/2022   HGBA1C 5.5 11/04/2020   HGBA1C 5.6 03/24/2020   HGBA1C 5.8 (H) 10/04/2019   HGBA1C 5.6 11/30/2018   Lab Results  Component Value Date   INSULIN 28.8 (H) 02/17/2022   Lab Results  Component Value Date  TSH 1.580 02/03/2022   Lab Results  Component Value Date   CHOL 231 (H) 02/17/2022   HDL 72 02/17/2022   LDLCALC 146 (H) 02/17/2022   TRIG 76 02/17/2022   CHOLHDL 3.2 02/17/2022   Lab Results  Component Value Date   VD25OH 30.7 02/17/2022   Lab Results  Component Value Date   WBC 6.4 02/17/2022   HGB 11.7 02/17/2022   HCT 36.0 02/17/2022   MCV 77 (L) 02/17/2022   PLT 476 (H) 02/17/2022   Lab Results  Component Value Date   IRON 45  02/17/2022   TIBC 445 02/17/2022   FERRITIN 42 02/17/2022   Attestation Statements:   Reviewed by clinician on day of visit: allergies, medications, problem list, medical history, surgical history, family history, social history, and previous encounter notes.  I, Elnora Morrison, RMA am acting as transcriptionist for Coralie Common, MD.  I have reviewed the above documentation for accuracy and completeness, and I agree with the above. - Coralie Common, MD

## 2022-07-07 ENCOUNTER — Other Ambulatory Visit (HOSPITAL_COMMUNITY): Payer: Self-pay

## 2022-07-08 ENCOUNTER — Other Ambulatory Visit (HOSPITAL_COMMUNITY): Payer: Self-pay

## 2022-07-09 ENCOUNTER — Encounter (INDEPENDENT_AMBULATORY_CARE_PROVIDER_SITE_OTHER): Payer: Self-pay | Admitting: Family Medicine

## 2022-07-11 ENCOUNTER — Other Ambulatory Visit (INDEPENDENT_AMBULATORY_CARE_PROVIDER_SITE_OTHER): Payer: Self-pay | Admitting: Family Medicine

## 2022-07-11 ENCOUNTER — Other Ambulatory Visit (HOSPITAL_COMMUNITY): Payer: Self-pay

## 2022-07-11 DIAGNOSIS — Z6841 Body Mass Index (BMI) 40.0 and over, adult: Secondary | ICD-10-CM

## 2022-07-11 MED ORDER — WEGOVY 0.5 MG/0.5ML ~~LOC~~ SOAJ
0.5000 mg | SUBCUTANEOUS | 0 refills | Status: DC
  Filled 2022-07-11: qty 2, 28d supply, fill #0

## 2022-07-11 NOTE — Telephone Encounter (Signed)
Please advise 

## 2022-07-12 ENCOUNTER — Other Ambulatory Visit (HOSPITAL_COMMUNITY): Payer: Self-pay

## 2022-07-13 ENCOUNTER — Other Ambulatory Visit (HOSPITAL_COMMUNITY): Payer: Self-pay

## 2022-07-14 ENCOUNTER — Other Ambulatory Visit (HOSPITAL_COMMUNITY): Payer: Self-pay

## 2022-07-15 ENCOUNTER — Other Ambulatory Visit (HOSPITAL_COMMUNITY): Payer: Self-pay

## 2022-07-15 DIAGNOSIS — H5213 Myopia, bilateral: Secondary | ICD-10-CM | POA: Diagnosis not present

## 2022-07-18 ENCOUNTER — Other Ambulatory Visit (HOSPITAL_COMMUNITY): Payer: Self-pay

## 2022-07-19 ENCOUNTER — Other Ambulatory Visit (HOSPITAL_COMMUNITY): Payer: Self-pay

## 2022-07-20 ENCOUNTER — Other Ambulatory Visit (HOSPITAL_COMMUNITY): Payer: Self-pay

## 2022-07-21 ENCOUNTER — Other Ambulatory Visit (HOSPITAL_COMMUNITY): Payer: Self-pay

## 2022-07-25 ENCOUNTER — Other Ambulatory Visit (HOSPITAL_COMMUNITY): Payer: Self-pay

## 2022-07-25 ENCOUNTER — Encounter: Payer: Self-pay | Admitting: *Deleted

## 2022-07-26 ENCOUNTER — Other Ambulatory Visit (HOSPITAL_COMMUNITY): Payer: Self-pay

## 2022-07-27 ENCOUNTER — Encounter: Payer: Self-pay | Admitting: Nurse Practitioner

## 2022-07-27 DIAGNOSIS — J069 Acute upper respiratory infection, unspecified: Secondary | ICD-10-CM | POA: Insufficient documentation

## 2022-07-27 NOTE — Assessment & Plan Note (Signed)
-   predniSONE (DELTASONE) 20 MG tablet; Take 1 tablet (20 mg total) by mouth daily with breakfast for 5 days.  Dispense: 5 tablet; Refill: 0 - fluticasone (FLONASE) 50 MCG/ACT nasal spray; Place 2 sprays into both nostrils daily.  Dispense: 16 g; Refill: 6  Stay well hydrated  Stay active  Deep breathing exercises  May take tylenol or fever or pain  Olfactory retraining using essential oils    Follow up:  Follow up in 2 weeks or sooner if needed

## 2022-07-28 ENCOUNTER — Other Ambulatory Visit (HOSPITAL_COMMUNITY): Payer: Self-pay

## 2022-07-29 ENCOUNTER — Other Ambulatory Visit (HOSPITAL_COMMUNITY): Payer: Self-pay

## 2022-08-01 ENCOUNTER — Encounter (INDEPENDENT_AMBULATORY_CARE_PROVIDER_SITE_OTHER): Payer: Self-pay | Admitting: Family Medicine

## 2022-08-01 ENCOUNTER — Ambulatory Visit (INDEPENDENT_AMBULATORY_CARE_PROVIDER_SITE_OTHER): Payer: 59 | Admitting: Family Medicine

## 2022-08-01 ENCOUNTER — Other Ambulatory Visit (HOSPITAL_COMMUNITY): Payer: Self-pay

## 2022-08-01 VITALS — BP 112/71 | HR 80 | Temp 98.3°F | Ht 68.0 in | Wt 279.0 lb

## 2022-08-01 DIAGNOSIS — E669 Obesity, unspecified: Secondary | ICD-10-CM

## 2022-08-01 DIAGNOSIS — I10 Essential (primary) hypertension: Secondary | ICD-10-CM | POA: Diagnosis not present

## 2022-08-01 DIAGNOSIS — Z6841 Body Mass Index (BMI) 40.0 and over, adult: Secondary | ICD-10-CM | POA: Diagnosis not present

## 2022-08-01 DIAGNOSIS — E559 Vitamin D deficiency, unspecified: Secondary | ICD-10-CM

## 2022-08-01 MED ORDER — VITAMIN D (ERGOCALCIFEROL) 1.25 MG (50000 UNIT) PO CAPS
50000.0000 [IU] | ORAL_CAPSULE | ORAL | 0 refills | Status: DC
  Filled 2022-08-01 – 2022-08-17 (×2): qty 4, 28d supply, fill #0

## 2022-08-01 MED ORDER — BD PEN NEEDLE NANO 2ND GEN 32G X 4 MM MISC
1.0000 | Freq: Two times a day (BID) | 0 refills | Status: DC
  Filled 2022-08-01 – 2022-08-11 (×2): qty 100, 50d supply, fill #0

## 2022-08-01 MED ORDER — SAXENDA 18 MG/3ML ~~LOC~~ SOPN
3.0000 mg | PEN_INJECTOR | Freq: Every day | SUBCUTANEOUS | 0 refills | Status: DC
  Filled 2022-08-01 – 2022-08-04 (×2): qty 15, 30d supply, fill #0

## 2022-08-02 ENCOUNTER — Other Ambulatory Visit (HOSPITAL_COMMUNITY): Payer: Self-pay

## 2022-08-02 NOTE — Progress Notes (Signed)
Chief Complaint:   OBESITY Megan Mitchell is here to discuss her progress with her obesity treatment plan along with follow-up of her obesity related diagnoses. Megan Mitchell is on the [place] and states she is following her eating plan approximately 80% of the time. Megan Mitchell states she is exercising 45 minutes 4 times per week.  Today's visit was #: 6 Starting weight: 273 lbs Starting date: 02/17/2022 Today's weight: 279 lbs Today's date: 08/01/2022 Total lbs lost to date: 0 lbs Total lbs lost since last in-office visit: 0  Interim History: Megan Mitchell has been trying to stay consistent on plan and get all protein in. She has been doing vegetables and protein with minimal rice and bread. Occasionally doing sweet potato. Thinks she has been maybe taking in more sugar/sweets over the last few weeks.  Subjective:   1. Vitamin D deficiency Megan Mitchell is currently taking prescription Vit D 50,000 IU once a week. Denies any nausea, vomiting or muscle weakness. She notes fatigue. Her last Vit D level of 30.7.  2. Essential hypertension Blood pressure well controlled today. Denies chest pain, chest pressure and headache.  Assessment/Plan:   1. Vitamin D deficiency We will refill Vit D 50K IU once a week for 1 month with 0 refills.  -Refill Vitamin D, Ergocalciferol, (DRISDOL) 1.25 MG (50000 UNIT) CAPS capsule; Take 1 capsule by mouth every 7 days.  Dispense: 4 capsule; Refill: 0  2. Essential hypertension Continue current medication without any changes in doses.  3. Obesity with current BMI of 42.4 We will refill Saxenda 3 mg once daily for 1 month with 0 refills.  -Refill Liraglutide -Weight Management (SAXENDA) 18 MG/3ML SOPN; Inject 3 mg into the skin daily.  Dispense: 15 mL; Refill: 0  -Refill Insulin Pen Needle (BD PEN NEEDLE NANO 2ND GEN) 32G X 4 MM MISC; Use 1 needle into the skin 2 times daily as directed.  Dispense: 100 each; Refill: 0  Megan Mitchell is currently in the action stage of change. As  such, her goal is to continue with weight loss efforts. She has agreed to the [employer].   Exercise goals: All adults should avoid inactivity. Some physical activity is better than none, and adults who participate in any amount of physical activity gain some health benefits.  Behavioral modification strategies: increasing lean protein intake, meal planning and cooking strategies, keeping healthy foods in the home, and planning for success.  Megan Mitchell has agreed to follow-up with our clinic in 4 weeks. She was informed of the importance of frequent follow-up visits to maximize her success with intensive lifestyle modifications for her multiple health conditions.   Objective:   Blood pressure 112/71, pulse 80, temperature 98.3 F (36.8 C), height '5\' 8"'$  (1.727 m), weight 279 lb (126.6 kg), last menstrual period 06/14/2020, SpO2 99 %. Body mass index is 42.42 kg/m.  General: Cooperative, alert, well developed, in no acute distress. HEENT: Conjunctivae and lids unremarkable. Cardiovascular: Regular rhythm.  Lungs: Normal work of breathing. Neurologic: No focal deficits.   Lab Results  Component Value Date   CREATININE 0.77 02/17/2022   BUN 12 02/17/2022   NA 140 02/17/2022   K 4.1 02/17/2022   CL 101 02/17/2022   CO2 23 02/17/2022   Lab Results  Component Value Date   ALT 14 02/17/2022   AST 19 02/17/2022   ALKPHOS 99 02/17/2022   BILITOT 0.4 02/17/2022   Lab Results  Component Value Date   HGBA1C 5.8 (H) 02/17/2022   HGBA1C 5.5 11/04/2020  HGBA1C 5.6 03/24/2020   HGBA1C 5.8 (H) 10/04/2019   HGBA1C 5.6 11/30/2018   Lab Results  Component Value Date   INSULIN 28.8 (H) 02/17/2022   Lab Results  Component Value Date   TSH 1.580 02/03/2022   Lab Results  Component Value Date   CHOL 231 (H) 02/17/2022   HDL 72 02/17/2022   LDLCALC 146 (H) 02/17/2022   TRIG 76 02/17/2022   CHOLHDL 3.2 02/17/2022   Lab Results  Component Value Date   VD25OH 30.7 02/17/2022    Lab Results  Component Value Date   WBC 6.4 02/17/2022   HGB 11.7 02/17/2022   HCT 36.0 02/17/2022   MCV 77 (L) 02/17/2022   PLT 476 (H) 02/17/2022   Lab Results  Component Value Date   IRON 45 02/17/2022   TIBC 445 02/17/2022   FERRITIN 42 02/17/2022   Attestation Statements:   Reviewed by clinician on day of visit: allergies, medications, problem list, medical history, surgical history, family history, social history, and previous encounter notes.  I, Megan Mitchell, RMA am acting as transcriptionist for Coralie Common, MD. I have reviewed the above documentation for accuracy and completeness, and I agree with the above. - Coralie Common, MD

## 2022-08-03 ENCOUNTER — Telehealth (INDEPENDENT_AMBULATORY_CARE_PROVIDER_SITE_OTHER): Payer: Self-pay | Admitting: Family Medicine

## 2022-08-03 ENCOUNTER — Other Ambulatory Visit (HOSPITAL_COMMUNITY): Payer: Self-pay

## 2022-08-03 NOTE — Telephone Encounter (Signed)
Please check PA and let pt know, Thanks

## 2022-08-03 NOTE — Telephone Encounter (Signed)
Patient calling stating that her pharmacy needs the prior authorization for new RX Saxenda. Pt states that she cannot receive the medication until the PA is completed. Please call pt at number on file.      [facility].

## 2022-08-04 ENCOUNTER — Other Ambulatory Visit (HOSPITAL_COMMUNITY): Payer: Self-pay

## 2022-08-04 ENCOUNTER — Telehealth (INDEPENDENT_AMBULATORY_CARE_PROVIDER_SITE_OTHER): Payer: Self-pay | Admitting: Family Medicine

## 2022-08-04 ENCOUNTER — Encounter (INDEPENDENT_AMBULATORY_CARE_PROVIDER_SITE_OTHER): Payer: Self-pay

## 2022-08-04 NOTE — Telephone Encounter (Signed)
Patient sent mychart message about prior authorization status.

## 2022-08-04 NOTE — Telephone Encounter (Signed)
Dr. Jearld Shines - Prior authorization approved for Saxenda. Effective: 08/04/2022 to 11/24/2022. Patient sent approval message via mychart.

## 2022-08-05 ENCOUNTER — Other Ambulatory Visit (HOSPITAL_COMMUNITY): Payer: Self-pay

## 2022-08-09 ENCOUNTER — Other Ambulatory Visit (HOSPITAL_COMMUNITY): Payer: Self-pay

## 2022-08-10 ENCOUNTER — Other Ambulatory Visit (HOSPITAL_COMMUNITY): Payer: Self-pay

## 2022-08-10 ENCOUNTER — Telehealth (INDEPENDENT_AMBULATORY_CARE_PROVIDER_SITE_OTHER): Payer: Self-pay | Admitting: Family Medicine

## 2022-08-10 NOTE — Telephone Encounter (Signed)
Please advise 

## 2022-08-10 NOTE — Telephone Encounter (Signed)
Patient is stating that the [place] prescribed may be too strong for her per her pharmacy. She sent a message thru my chart addressing her concern.

## 2022-08-11 ENCOUNTER — Other Ambulatory Visit (HOSPITAL_COMMUNITY): Payer: Self-pay

## 2022-08-15 ENCOUNTER — Other Ambulatory Visit (HOSPITAL_COMMUNITY): Payer: Self-pay

## 2022-08-17 ENCOUNTER — Other Ambulatory Visit (HOSPITAL_COMMUNITY): Payer: Self-pay

## 2022-08-18 ENCOUNTER — Other Ambulatory Visit (HOSPITAL_COMMUNITY): Payer: Self-pay

## 2022-08-30 ENCOUNTER — Other Ambulatory Visit (HOSPITAL_COMMUNITY): Payer: Self-pay

## 2022-09-01 ENCOUNTER — Other Ambulatory Visit (HOSPITAL_COMMUNITY): Payer: Self-pay

## 2022-09-02 ENCOUNTER — Other Ambulatory Visit (HOSPITAL_COMMUNITY): Payer: Self-pay

## 2022-09-05 ENCOUNTER — Ambulatory Visit (INDEPENDENT_AMBULATORY_CARE_PROVIDER_SITE_OTHER): Payer: 59 | Admitting: Family Medicine

## 2022-09-05 ENCOUNTER — Encounter (INDEPENDENT_AMBULATORY_CARE_PROVIDER_SITE_OTHER): Payer: Self-pay | Admitting: Family Medicine

## 2022-09-05 VITALS — BP 108/72 | HR 71 | Temp 98.2°F | Ht 68.0 in | Wt 275.0 lb

## 2022-09-05 DIAGNOSIS — E669 Obesity, unspecified: Secondary | ICD-10-CM

## 2022-09-05 DIAGNOSIS — E7849 Other hyperlipidemia: Secondary | ICD-10-CM | POA: Diagnosis not present

## 2022-09-05 DIAGNOSIS — F411 Generalized anxiety disorder: Secondary | ICD-10-CM | POA: Diagnosis not present

## 2022-09-05 DIAGNOSIS — Z6841 Body Mass Index (BMI) 40.0 and over, adult: Secondary | ICD-10-CM

## 2022-09-05 DIAGNOSIS — E559 Vitamin D deficiency, unspecified: Secondary | ICD-10-CM

## 2022-09-05 MED ORDER — BUSPIRONE HCL 5 MG PO TABS: 5.0000 mg | ORAL_TABLET | Freq: Three times a day (TID) | ORAL | 0 refills | Status: AC | PRN

## 2022-09-06 ENCOUNTER — Other Ambulatory Visit (HOSPITAL_COMMUNITY): Payer: Self-pay

## 2022-09-06 MED ORDER — VITAMIN D (ERGOCALCIFEROL) 1.25 MG (50000 UNIT) PO CAPS
50000.0000 [IU] | ORAL_CAPSULE | ORAL | 0 refills | Status: DC
  Filled 2022-09-06 (×2): qty 4, 28d supply, fill #0

## 2022-09-08 ENCOUNTER — Other Ambulatory Visit (HOSPITAL_COMMUNITY): Payer: Self-pay

## 2022-09-19 NOTE — Progress Notes (Signed)
Chief Complaint:   OBESITY Megan Mitchell is here to discuss her progress with her obesity treatment plan along with follow-up of her obesity related diagnoses. Megan Mitchell is on practicing portion control and making smarter food choices, such as increasing vegetables and decreasing simple carbohydrates and states she is following her eating plan approximately 85% of the time. Megan Mitchell states she is walking 45 minutes 4 times per week.  Today's visit was #: 7 Starting weight: 273 lbs Starting date: 02/17/2022 Today's weight: 275 lbs Today's date: 09/05/2022 Total lbs lost to date: 0 lbs Total lbs lost since last in-office visit: 4  Interim History: Megan Mitchell mentions she has to remind herself to eat to ensure she does not have side effects of Saxenda. Finds herself to be learning more toward vegetables than protein/meat. Has not decided yet what she will do for Thanksgiving. Would like to stick to Cat 3 plan.  Subjective:   1. Vitamin D deficiency Megan Mitchell is currently taking prescription Vit D 50,000 IU once a week. Her Vit D level of 30.7 in April 2023.  2. Other hyperlipidemia Megan Mitchell's last LDL of 146, HDL of 72, Trigly 76. She is not on medication.  3. GAD (generalized anxiety disorder) Megan Mitchell is not on medications. She is noticing significant more anxiety revolving working. She has Buspar at home.  Assessment/Plan:   1. Vitamin D deficiency We will refill Vit D 50K IU once a week for 1 month with 0 refills.  -Refill Vitamin D, Ergocalciferol, (DRISDOL) 1.25 MG (50000 UNIT) CAPS capsule; Take 1 capsule (50,000 Units total) by mouth every 7 (seven) days.  Dispense: 4 capsule; Refill: 0  2. Other hyperlipidemia Repeat labs in Jan 2024.  3. GAD (generalized anxiety disorder) We will refill Buspar 5 mg by mouth 3 times a day as needed for 1 month with 0 refills.  -Refill busPIRone (BUSPAR) 5 MG tablet; Take 1 tablet (5 mg total) by mouth 3 (three) times daily as needed (anxiety).  Dispense: 90  tablet; Refill: 0  4. Obesity with current BMI of 41.9 Megan Mitchell is currently in the action stage of change. As such, her goal is to continue with weight loss efforts. She has agreed to the Category 3 Plan +100.  Exercise goals: All adults should avoid inactivity. Some physical activity is better than none, and adults who participate in any amount of physical activity gain some health benefits.  Behavioral modification strategies: increasing lean protein intake, meal planning and cooking strategies, keeping healthy foods in the home, holiday eating strategies , and planning for success.  Megan Mitchell has agreed to follow-up with our clinic in 3 weeks. She was informed of the importance of frequent follow-up visits to maximize her success with intensive lifestyle modifications for her multiple health conditions.   Objective:   Blood pressure 108/72, pulse 71, temperature 98.2 F (36.8 C), height '5\' 8"'$  (1.727 m), weight 275 lb (124.7 kg), last menstrual period 06/14/2020, SpO2 98 %. Body mass index is 41.81 kg/m.  General: Cooperative, alert, well developed, in no acute distress. HEENT: Conjunctivae and lids unremarkable. Cardiovascular: Regular rhythm.  Lungs: Normal work of breathing. Neurologic: No focal deficits.   Lab Results  Component Value Date   CREATININE 0.77 02/17/2022   BUN 12 02/17/2022   NA 140 02/17/2022   K 4.1 02/17/2022   CL 101 02/17/2022   CO2 23 02/17/2022   Lab Results  Component Value Date   ALT 14 02/17/2022   AST 19 02/17/2022   ALKPHOS 99  02/17/2022   BILITOT 0.4 02/17/2022   Lab Results  Component Value Date   HGBA1C 5.8 (H) 02/17/2022   HGBA1C 5.5 11/04/2020   HGBA1C 5.6 03/24/2020   HGBA1C 5.8 (H) 10/04/2019   HGBA1C 5.6 11/30/2018   Lab Results  Component Value Date   INSULIN 28.8 (H) 02/17/2022   Lab Results  Component Value Date   TSH 1.580 02/03/2022   Lab Results  Component Value Date   CHOL 231 (H) 02/17/2022   HDL 72 02/17/2022    LDLCALC 146 (H) 02/17/2022   TRIG 76 02/17/2022   CHOLHDL 3.2 02/17/2022   Lab Results  Component Value Date   VD25OH 30.7 02/17/2022   Lab Results  Component Value Date   WBC 6.4 02/17/2022   HGB 11.7 02/17/2022   HCT 36.0 02/17/2022   MCV 77 (L) 02/17/2022   PLT 476 (H) 02/17/2022   Lab Results  Component Value Date   IRON 45 02/17/2022   TIBC 445 02/17/2022   FERRITIN 42 02/17/2022   Attestation Statements:   Reviewed by clinician on day of visit: allergies, medications, problem list, medical history, surgical history, family history, social history, and previous encounter notes.  I, Elnora Morrison, RMA am acting as transcriptionist for Coralie Common, MD.  I have reviewed the above documentation for accuracy and completeness, and I agree with the above. - Coralie Common, MD

## 2022-09-27 ENCOUNTER — Other Ambulatory Visit (INDEPENDENT_AMBULATORY_CARE_PROVIDER_SITE_OTHER): Payer: Self-pay | Admitting: Family Medicine

## 2022-09-27 ENCOUNTER — Other Ambulatory Visit (HOSPITAL_COMMUNITY): Payer: Self-pay

## 2022-09-27 ENCOUNTER — Encounter (INDEPENDENT_AMBULATORY_CARE_PROVIDER_SITE_OTHER): Payer: Self-pay | Admitting: Family Medicine

## 2022-09-27 ENCOUNTER — Ambulatory Visit (INDEPENDENT_AMBULATORY_CARE_PROVIDER_SITE_OTHER): Payer: 59 | Admitting: Family Medicine

## 2022-09-27 VITALS — BP 119/69 | HR 70 | Temp 98.2°F | Ht 68.0 in | Wt 277.0 lb

## 2022-09-27 DIAGNOSIS — E559 Vitamin D deficiency, unspecified: Secondary | ICD-10-CM

## 2022-09-27 DIAGNOSIS — E7849 Other hyperlipidemia: Secondary | ICD-10-CM | POA: Diagnosis not present

## 2022-09-27 DIAGNOSIS — Z6841 Body Mass Index (BMI) 40.0 and over, adult: Secondary | ICD-10-CM | POA: Diagnosis not present

## 2022-09-27 DIAGNOSIS — E669 Obesity, unspecified: Secondary | ICD-10-CM | POA: Diagnosis not present

## 2022-09-27 MED ORDER — SAXENDA 18 MG/3ML ~~LOC~~ SOPN
3.0000 mg | PEN_INJECTOR | Freq: Every day | SUBCUTANEOUS | 0 refills | Status: DC
  Filled 2022-09-27: qty 15, 30d supply, fill #0

## 2022-09-27 MED ORDER — TECHLITE PEN NEEDLES 32G X 4 MM MISC
1.0000 | Freq: Two times a day (BID) | 0 refills | Status: DC
  Filled 2022-09-27: qty 100, 50d supply, fill #0

## 2022-09-27 MED ORDER — VITAMIN D (ERGOCALCIFEROL) 1.25 MG (50000 UNIT) PO CAPS
50000.0000 [IU] | ORAL_CAPSULE | ORAL | 0 refills | Status: DC
  Filled 2022-09-27 – 2022-10-11 (×2): qty 4, 28d supply, fill #0

## 2022-09-28 ENCOUNTER — Encounter: Payer: Self-pay | Admitting: Orthopaedic Surgery

## 2022-09-28 ENCOUNTER — Other Ambulatory Visit (HOSPITAL_COMMUNITY): Payer: Self-pay

## 2022-09-28 ENCOUNTER — Ambulatory Visit (INDEPENDENT_AMBULATORY_CARE_PROVIDER_SITE_OTHER): Payer: 59 | Admitting: Orthopaedic Surgery

## 2022-09-28 DIAGNOSIS — M25562 Pain in left knee: Secondary | ICD-10-CM | POA: Diagnosis not present

## 2022-09-28 MED ORDER — METHYLPREDNISOLONE ACETATE 40 MG/ML IJ SUSP
80.0000 mg | INTRAMUSCULAR | Status: AC | PRN
  Administered 2022-09-28: 80 mg via INTRA_ARTICULAR

## 2022-09-28 MED ORDER — LIDOCAINE HCL 1 % IJ SOLN
2.0000 mL | INTRAMUSCULAR | Status: AC | PRN
  Administered 2022-09-28: 2 mL

## 2022-09-28 MED ORDER — BUPIVACAINE HCL 0.25 % IJ SOLN
2.0000 mL | INTRAMUSCULAR | Status: AC | PRN
  Administered 2022-09-28: 2 mL via INTRA_ARTICULAR

## 2022-09-28 NOTE — Progress Notes (Signed)
Office Visit Note   Patient: Megan Mitchell           Date of Birth: 1971-08-05           MRN: [id number] Visit Date: 09/28/2022              Requested by: Inda Coke, [place] [address] [place],  [place] [address] PCP: Inda Coke, [place]   Assessment & Plan: Visit Diagnoses:  1. Acute pain of left knee     Plan: Mrs. Cousin has been followed for the problem referable to her left knee.  Prior x-rays demonstrated patellofemoral and lateral compartment arthritis.  She was last seen in June for cortisone injection and relates it made a big difference and she would like to have another.  She is in the midst of a wellness program and is already lost about 20 pounds her BMI is presently about 38.5.  We will inject her knee today and plan to see her back as needed  Follow-Up Instructions: Return if symptoms worsen or fail to improve.   Orders:  No orders of the defined types were placed in this encounter.  No orders of the defined types were placed in this encounter.     Procedures: Large Joint Inj: L knee on 09/28/2022 4:19 PM Indications: pain and diagnostic evaluation Details: 25 G 1.5 in needle, anterolateral approach  Arthrogram: No  Medications: 2 mL lidocaine 1 %; 80 mg methylPREDNISolone acetate 40 MG/ML; 2 mL bupivacaine 0.25 % Procedure, treatment alternatives, risks and benefits explained, specific risks discussed. Consent was given by the patient. Patient was prepped and draped in the usual sterile fashion.       Clinical Data: No additional findings.   Subjective: Chief Complaint  Patient presents with   Left Knee - Pain   Recurrent symptoms of osteoarthritis left knee.  No recent injury or trauma.  Has lost about 20 pounds being involved in a wellness program.  Present BMI is 38 and previously was 42 HPI  Review of Systems   Objective: Vital Signs: LMP 06/14/2020 (Approximate)   Physical Exam Constitutional:      Appearance: She is  well-developed.  Eyes:     Pupils: Pupils are equal, round, and reactive to light.  Pulmonary:     Effort: Pulmonary effort is normal.  Skin:    General: Skin is warm and dry.  Neurological:     Mental Status: She is alert and oriented to person, place, and time.  Psychiatric:        Behavior: Behavior normal.     Ortho Exam awake alert and oriented x3.  Comfortable sitting large knees.  No obvious effusion left knee.  There is some patella femoral crepitation but no pain with compression and some mild lateral joint pain.  Full extension flexed over of 95 degrees without instability.  Specialty Comments:  No specialty comments available.  Imaging: No results found.   PMFS History: Patient Active Problem List   Diagnosis Date Noted   Upper respiratory tract infection 07/27/2022   Pain in left knee 04/20/2022   Urinary tract infection 05/11/2021   Pelvic mass in female 04/08/2021   Fibroids 04/08/2021   Anemia 12/26/2014   HTN (hypertension) 03/22/2014   Severe obesity (BMI >= 40) ([facility]) 03/22/2014   Past Medical History:  Diagnosis Date   Allergy    seasonal   Anemia    Anxiety    Arthritis    left knee   Fibroids  Hypertension    Ovarian cyst     Family History  Problem Relation Age of Onset   Hypertension Mother    Hypertension Maternal Grandmother    Heart attack Maternal Grandfather    Hypertension Maternal Grandfather    Colon polyps Neg Hx    Esophageal cancer Neg Hx    Rectal cancer Neg Hx    Stomach cancer Neg Hx    Breast cancer Neg Hx    Colon cancer Neg Hx     Past Surgical History:  Procedure Laterality Date   CHOLECYSTECTOMY     COLONOSCOPY     ELBOW SURGERY     ROBOTIC ASSISTED TOTAL HYSTERECTOMY WITH BILATERAL SALPINGO OOPHERECTOMY Bilateral 05/11/2021   Procedure: XI ROBOTIC ASSISTED TOTAL HYSTERECTOMY GREATER THAN 250 GRAMS WITH BILATERAL SALPINGO OOPHORECTOMY;MINI LAPAROTOMY FOR CYST DECOMPRESSION;OMENTECTOMY;  Surgeon: Everitt Amber,  MD;  Location: [facility];  Service: Gynecology;  Laterality: Bilateral;   TUBAL LIGATION     Social History   Occupational History   Occupation: Location manager Account Rep  Tobacco Use   Smoking status: Never   Smokeless tobacco: Never  Vaping Use   Vaping Use: Never used  Substance and Sexual Activity   Alcohol use: No   Drug use: No   Sexual activity: Yes    Birth control/protection: Surgical     Garald Balding, MD   Note - This record has been created using Editor, commissioning.  Chart creation errors have been sought, but may not always  have been located. Such creation errors do not reflect on  the standard of medical care.

## 2022-10-06 NOTE — Progress Notes (Signed)
Chief Complaint:   OBESITY Megan Mitchell is here to discuss her progress with her obesity treatment plan along with follow-up of her obesity related diagnoses. Megan Mitchell is on the Category 3 Plan 100 and states she is following her eating plan approximately 85% of the time. Megan Mitchell states she is walking 25-30 minutes 4 times per week.  Today's visit was #: 8 Starting weight: 273 lbs Starting date: 02/17/2022 Today's weight: 277 lbs Today's date: 09/27/2022 Total lbs lost to date: 0 lbs Total lbs lost since last in-office visit: 0  Interim History: Patient had a quiet Thanksgiving with her husband.  Still she did very well in terms of compliance on meal plan over the last few weeks.  Breakfast is scrambled egg, [place] bacon on toast or English muffin.  Trying to focus on protein.  Dinner is often 2 chicken breasts, vegetables with potato or fries.  Snacks are peanut butter and graham crackers.  Subjective:   1. Vitamin D deficiency Megan Mitchell's last labs done of 30.7. Denies any nausea, vomiting or muscle weakness. She notes fatigue.  2. Other hyperlipidemia Megan Mitchell's last LDL of 146, HDL of 72, trig of 76.  She is not on medication.  Assessment/Plan:   1. Vitamin D deficiency We will refill Vit D 50K IU once a week for 1 month with 0 refills. Labs at next appointment.  -Refill Vitamin D, Ergocalciferol, (DRISDOL) 1.25 MG (50000 UNIT) CAPS capsule; Take 1 capsule (50,000 Units total) by mouth every 7 (seven) days.  Dispense: 4 capsule; Refill: 0  2. Other hyperlipidemia Will repeat labs in 3 weeks.  3. Obesity with current BMI of 42.1 We will refill Saxenda 3 mg SubQ once daily for 1 month with 0 refills.  -Refill Liraglutide -Weight Management (SAXENDA) 18 MG/3ML SOPN; Inject 3 mg into the skin daily.  Dispense: 45 mL; Refill: 0  Megan Mitchell is currently in the action stage of change. As such, her goal is to continue with weight loss efforts. She has agreed to the Category 3 Plan.   Exercise  goals: As is.  Behavioral modification strategies: increasing lean protein intake, meal planning and cooking strategies, keeping healthy foods in the home, and planning for success.  Megan Mitchell has agreed to follow-up with our clinic in 3 weeks. She was informed of the importance of frequent follow-up visits to maximize her success with intensive lifestyle modifications for her multiple health conditions.   Objective:   Blood pressure 119/69, pulse 70, temperature 98.2 F (36.8 C), height '5\' 8"'$  (1.727 m), weight 277 lb (125.6 kg), last menstrual period 06/14/2020, SpO2 100 %. Body mass index is 42.12 kg/m.  General: Cooperative, alert, well developed, in no acute distress. HEENT: Conjunctivae and lids unremarkable. Cardiovascular: Regular rhythm.  Lungs: Normal work of breathing. Neurologic: No focal deficits.   Lab Results  Component Value Date   CREATININE 0.77 02/17/2022   BUN 12 02/17/2022   NA 140 02/17/2022   K 4.1 02/17/2022   CL 101 02/17/2022   CO2 23 02/17/2022   Lab Results  Component Value Date   ALT 14 02/17/2022   AST 19 02/17/2022   ALKPHOS 99 02/17/2022   BILITOT 0.4 02/17/2022   Lab Results  Component Value Date   HGBA1C 5.8 (H) 02/17/2022   HGBA1C 5.5 11/04/2020   HGBA1C 5.6 03/24/2020   HGBA1C 5.8 (H) 10/04/2019   HGBA1C 5.6 11/30/2018   Lab Results  Component Value Date   INSULIN 28.8 (H) 02/17/2022   Lab Results  Component Value Date   TSH 1.580 02/03/2022   Lab Results  Component Value Date   CHOL 231 (H) 02/17/2022   HDL 72 02/17/2022   LDLCALC 146 (H) 02/17/2022   TRIG 76 02/17/2022   CHOLHDL 3.2 02/17/2022   Lab Results  Component Value Date   VD25OH 30.7 02/17/2022   Lab Results  Component Value Date   WBC 6.4 02/17/2022   HGB 11.7 02/17/2022   HCT 36.0 02/17/2022   MCV 77 (L) 02/17/2022   PLT 476 (H) 02/17/2022   Lab Results  Component Value Date   IRON 45 02/17/2022   TIBC 445 02/17/2022   FERRITIN 42 02/17/2022    Attestation Statements:   Reviewed by clinician on day of visit: allergies, medications, problem list, medical history, surgical history, family history, social history, and previous encounter notes.  I, Elnora Morrison, RMA am acting as transcriptionist for Coralie Common, MD.  I have reviewed the above documentation for accuracy and completeness, and I agree with the above. - Coralie Common, MD

## 2022-10-07 ENCOUNTER — Other Ambulatory Visit (HOSPITAL_COMMUNITY): Payer: Self-pay

## 2022-10-11 ENCOUNTER — Other Ambulatory Visit: Payer: Self-pay | Admitting: Nurse Practitioner

## 2022-10-11 ENCOUNTER — Telehealth: Payer: Self-pay | Admitting: Orthopaedic Surgery

## 2022-10-11 ENCOUNTER — Other Ambulatory Visit (HOSPITAL_COMMUNITY): Payer: Self-pay

## 2022-10-11 DIAGNOSIS — I10 Essential (primary) hypertension: Secondary | ICD-10-CM

## 2022-10-11 NOTE — Telephone Encounter (Signed)
Pt called stating she got an injection last week and it only lasted for 3 days and asking for something for pain. Pt pharmacy uses [facility]. Pt is also asking for a call back for medical advice too. Please call pt desk at [phone]. If unable to answer leave a voicemail. She is at work 7 to 3:30 try cell phone if after 3;30. Cell phone number is [phone].

## 2022-10-13 ENCOUNTER — Encounter: Payer: Self-pay | Admitting: *Deleted

## 2022-10-16 ENCOUNTER — Other Ambulatory Visit: Payer: Self-pay | Admitting: Nurse Practitioner

## 2022-10-16 DIAGNOSIS — I10 Essential (primary) hypertension: Secondary | ICD-10-CM

## 2022-10-17 ENCOUNTER — Other Ambulatory Visit (HOSPITAL_COMMUNITY): Payer: Self-pay

## 2022-10-17 ENCOUNTER — Other Ambulatory Visit: Payer: Self-pay | Admitting: Family Medicine

## 2022-10-17 DIAGNOSIS — I10 Essential (primary) hypertension: Secondary | ICD-10-CM

## 2022-10-18 ENCOUNTER — Other Ambulatory Visit (HOSPITAL_COMMUNITY): Payer: Self-pay

## 2022-10-18 ENCOUNTER — Ambulatory Visit (INDEPENDENT_AMBULATORY_CARE_PROVIDER_SITE_OTHER): Payer: 59 | Admitting: Family Medicine

## 2022-10-18 ENCOUNTER — Encounter (INDEPENDENT_AMBULATORY_CARE_PROVIDER_SITE_OTHER): Payer: Self-pay | Admitting: Family Medicine

## 2022-10-18 VITALS — BP 104/69 | HR 101 | Temp 97.9°F | Ht 68.0 in | Wt 277.0 lb

## 2022-10-18 DIAGNOSIS — I10 Essential (primary) hypertension: Secondary | ICD-10-CM

## 2022-10-18 DIAGNOSIS — E559 Vitamin D deficiency, unspecified: Secondary | ICD-10-CM

## 2022-10-18 DIAGNOSIS — D509 Iron deficiency anemia, unspecified: Secondary | ICD-10-CM | POA: Diagnosis not present

## 2022-10-18 DIAGNOSIS — E7849 Other hyperlipidemia: Secondary | ICD-10-CM | POA: Diagnosis not present

## 2022-10-18 DIAGNOSIS — Z6841 Body Mass Index (BMI) 40.0 and over, adult: Secondary | ICD-10-CM

## 2022-10-18 DIAGNOSIS — R7303 Prediabetes: Secondary | ICD-10-CM

## 2022-10-18 DIAGNOSIS — E669 Obesity, unspecified: Secondary | ICD-10-CM | POA: Diagnosis not present

## 2022-10-18 MED ORDER — LOSARTAN POTASSIUM 50 MG PO TABS
50.0000 mg | ORAL_TABLET | Freq: Every day | ORAL | 3 refills | Status: DC
  Filled 2022-10-18: qty 90, 90d supply, fill #0

## 2022-10-18 MED ORDER — WEGOVY 1 MG/0.5ML ~~LOC~~ SOAJ: 1.0000 mg | SUBCUTANEOUS | 0 refills | Status: DC

## 2022-10-20 ENCOUNTER — Ambulatory Visit: Payer: 59 | Admitting: Orthopaedic Surgery

## 2022-10-21 LAB — CBC
Hemoglobin: 12.1 g/dL (ref 11.1–15.9)
MCH: 23.4 pg — ABNORMAL LOW (ref 26.6–33.0)
MCHC: 30.9 g/dL — ABNORMAL LOW (ref 31.5–35.7)
MCV: 76 fL — ABNORMAL LOW (ref 79–97)
Platelets: 509 10*3/uL — ABNORMAL HIGH (ref 150–450)
RBC: 5.16 x10E6/uL (ref 3.77–5.28)
RDW: 15.2 % (ref 11.7–15.4)
WBC: 7.1 10*3/uL (ref 3.4–10.8)

## 2022-10-21 LAB — LIPID PANEL WITH LDL/HDL RATIO
Cholesterol, Total: 230 mg/dL — ABNORMAL HIGH (ref 100–199)
HDL: 67 mg/dL (ref >39)
LDL Chol Calc (NIH): 146 mg/dL — ABNORMAL HIGH (ref 0–99)
LDL/HDL Ratio: 2.2 ratio (ref 0.0–3.2)
Triglycerides: 96 mg/dL (ref 0–149)
VLDL Cholesterol Cal: 17 mg/dL (ref 5–40)

## 2022-10-21 LAB — ANEMIA PANEL
Ferritin: 48 ng/mL (ref 15–150)
Folate, Hemolysate: 380 ng/mL
Folate, RBC: 972 ng/mL (ref >498)
Hematocrit: 39.1 % (ref 34.0–46.6)
Iron Saturation: 11 % — ABNORMAL LOW (ref 15–55)
Iron: 51 ug/dL (ref 27–159)
Retic Ct Pct: 1.9 % (ref 0.6–2.6)
Total Iron Binding Capacity: 467 ug/dL — ABNORMAL HIGH (ref 250–450)
UIBC: 416 ug/dL (ref 131–425)
Vitamin B-12: 1431 pg/mL — ABNORMAL HIGH (ref 232–1245)

## 2022-10-21 LAB — COMPREHENSIVE METABOLIC PANEL
ALT: 17 IU/L (ref 0–32)
AST: 21 IU/L (ref 0–40)
Albumin/Globulin Ratio: 1.4 (ref 1.2–2.2)
Albumin: 4.5 g/dL (ref 3.8–4.9)
Alkaline Phosphatase: 106 IU/L (ref 44–121)
BUN/Creatinine Ratio: 12 (ref 9–23)
BUN: 10 mg/dL (ref 6–24)
Bilirubin Total: 0.6 mg/dL (ref 0.0–1.2)
CO2: 22 mmol/L (ref 20–29)
Calcium: 10.3 mg/dL — ABNORMAL HIGH (ref 8.7–10.2)
Chloride: 97 mmol/L (ref 96–106)
Creatinine, Ser: 0.85 mg/dL (ref 0.57–1.00)
Globulin, Total: 3.3 g/dL (ref 1.5–4.5)
Glucose: 76 mg/dL (ref 70–99)
Potassium: 3.7 mmol/L (ref 3.5–5.2)
Sodium: 136 mmol/L (ref 134–144)
Total Protein: 7.8 g/dL (ref 6.0–8.5)
eGFR: 82 mL/min/{1.73_m2} (ref >59)

## 2022-10-21 LAB — HEMOGLOBIN A1C
Est. average glucose Bld gHb Est-mCnc: 114 mg/dL
Hgb A1c MFr Bld: 5.6 % (ref 4.8–5.6)

## 2022-10-21 LAB — INSULIN, RANDOM: INSULIN: 23.5 u[IU]/mL (ref 2.6–24.9)

## 2022-10-21 LAB — VITAMIN D 25 HYDROXY (VIT D DEFICIENCY, FRACTURES): Vit D, 25-Hydroxy: 53.1 ng/mL (ref 30.0–100.0)

## 2022-10-25 ENCOUNTER — Other Ambulatory Visit (HOSPITAL_COMMUNITY): Payer: Self-pay

## 2022-10-27 ENCOUNTER — Ambulatory Visit: Payer: 59 | Admitting: Orthopaedic Surgery

## 2022-10-31 ENCOUNTER — Other Ambulatory Visit: Payer: Self-pay | Admitting: Nurse Practitioner

## 2022-10-31 ENCOUNTER — Other Ambulatory Visit (INDEPENDENT_AMBULATORY_CARE_PROVIDER_SITE_OTHER): Payer: Self-pay | Admitting: Family Medicine

## 2022-10-31 DIAGNOSIS — I10 Essential (primary) hypertension: Secondary | ICD-10-CM

## 2022-10-31 DIAGNOSIS — E559 Vitamin D deficiency, unspecified: Secondary | ICD-10-CM

## 2022-11-01 ENCOUNTER — Other Ambulatory Visit (INDEPENDENT_AMBULATORY_CARE_PROVIDER_SITE_OTHER): Payer: Self-pay | Admitting: Physician Assistant

## 2022-11-01 ENCOUNTER — Other Ambulatory Visit (HOSPITAL_COMMUNITY): Payer: Self-pay

## 2022-11-01 ENCOUNTER — Other Ambulatory Visit: Payer: Self-pay | Admitting: Physician Assistant

## 2022-11-01 DIAGNOSIS — E559 Vitamin D deficiency, unspecified: Secondary | ICD-10-CM

## 2022-11-01 DIAGNOSIS — I10 Essential (primary) hypertension: Secondary | ICD-10-CM

## 2022-11-02 ENCOUNTER — Other Ambulatory Visit (HOSPITAL_COMMUNITY): Payer: Self-pay

## 2022-11-02 ENCOUNTER — Telehealth (INDEPENDENT_AMBULATORY_CARE_PROVIDER_SITE_OTHER): Payer: Self-pay | Admitting: Family Medicine

## 2022-11-02 NOTE — Progress Notes (Signed)
Chief Complaint:   OBESITY Megan Mitchell is here to discuss her progress with her obesity treatment plan along with follow-up of her obesity related diagnoses. Megan Mitchell is on the Category 3 Plan and states she is following her eating plan approximately 80% of the time. Megan Mitchell states she is exercising 0 minutes 0 times per week.  Today's visit was #: 9 Starting weight: 273 lbs Starting date: 02/17/2022 Today's weight: 277 lbs Today's date: 10/18/2022 Total lbs lost to date: 0 lbs Total lbs lost since last in-office visit: 0  Interim History: Megan Mitchell has been having knee issues and got another knee injection. Eating on plan and is getting all food in most of the time. May be missing meat at lunch and dinner.  Subjective:   1. Essential hypertension Blood pressure controlled today. Denies chest pain, chest pressure and headache.  2. Vitamin D deficiency Megan Mitchell denies any nausea, vomiting or muscle weakness. She notes fatigue.  3. Prediabetes Megan Mitchell's A1c was 5.8/insulin 28.8. On GLP-1.  4. Microcytic anemia Megan Mitchell's last iron sat low not on MVI.  5. Other hyperlipidemia Megan Mitchell is not on medication. Last LDL of 146.  Assessment/Plan:   1. Essential hypertension We will obtain labs today. We will refill Cozaar 50 mg by mouth daily for 3 months with 0 refills.  -Refill losartan (COZAAR) 50 MG tablet; Take 1 tablet (50 mg total) by mouth daily.  Dispense: 90 tablet; Refill: 3  - Comprehensive metabolic panel  2. Vitamin D deficiency We will obtain labs today.  - VITAMIN D 25 Hydroxy (Vit-D Deficiency, Fractures)  3. Prediabetes We will obtain labs today.  - Hemoglobin A1c - Insulin, random  4. Microcytic anemia We will obtain labs today.  - Anemia panel - CBC  5. Other hyperlipidemia We will obtain labs today.  - Lipid Panel With LDL/HDL Ratio  6. Obesity with current BMI of 42.2 We will refill Wegovy 1 mg SubQ once weekly for 1 month with 0 refills.  -Refill  Semaglutide-Weight Management (WEGOVY) 1 MG/0.5ML SOAJ; Inject 1 mg into the skin once a week.  Dispense: 2 mL; Refill: 0  Megan Mitchell is currently in the action stage of change. As such, her goal is to continue with weight loss efforts. She has agreed to the Category 3 Plan, keeping a food journal and adhering to recommended goals of 1550-1700 calories and 95+ grams of protein, and the [place] daily.   Exercise goals: No exercise has been prescribed at this time.  Behavioral modification strategies: increasing lean protein intake, meal planning and cooking strategies, keeping healthy foods in the home, and planning for success.  Megan Mitchell has agreed to follow-up with our clinic in 4 weeks. She was informed of the importance of frequent follow-up visits to maximize her success with intensive lifestyle modifications for her multiple health conditions.   Megan Mitchell was informed we would discuss her lab results at her next visit unless there is a critical issue that needs to be addressed sooner. Megan Mitchell agreed to keep her next visit at the agreed upon time to discuss these results.  Objective:   Blood pressure 104/69, pulse (!) 101, temperature 97.9 F (36.6 C), height '5\' 8"'$  (1.727 m), weight 277 lb (125.6 kg), last menstrual period 06/14/2020, SpO2 97 %. Body mass index is 42.12 kg/m.  General: Cooperative, alert, well developed, in no acute distress. HEENT: Conjunctivae and lids unremarkable. Cardiovascular: Regular rhythm.  Lungs: Normal work of breathing. Neurologic: No focal deficits.   Lab Results  Component  Value Date   CREATININE 0.85 10/18/2022   BUN 10 10/18/2022   NA 136 10/18/2022   K 3.7 10/18/2022   CL 97 10/18/2022   CO2 22 10/18/2022   Lab Results  Component Value Date   ALT 17 10/18/2022   AST 21 10/18/2022   ALKPHOS 106 10/18/2022   BILITOT 0.6 10/18/2022   Lab Results  Component Value Date   HGBA1C 5.6 10/18/2022   HGBA1C 5.8 (H) 02/17/2022   HGBA1C 5.5 11/04/2020    HGBA1C 5.6 03/24/2020   HGBA1C 5.8 (H) 10/04/2019   Lab Results  Component Value Date   INSULIN 23.5 10/18/2022   INSULIN 28.8 (H) 02/17/2022   Lab Results  Component Value Date   TSH 1.580 02/03/2022   Lab Results  Component Value Date   CHOL 230 (H) 10/18/2022   HDL 67 10/18/2022   LDLCALC 146 (H) 10/18/2022   TRIG 96 10/18/2022   CHOLHDL 3.2 02/17/2022   Lab Results  Component Value Date   VD25OH 53.1 10/18/2022   VD25OH 30.7 02/17/2022   Lab Results  Component Value Date   WBC 7.1 10/18/2022   HGB 12.1 10/18/2022   HCT 39.1 10/18/2022   MCV 76 (L) 10/18/2022   PLT 509 (H) 10/18/2022   Lab Results  Component Value Date   IRON 51 10/18/2022   TIBC 467 (H) 10/18/2022   FERRITIN 48 10/18/2022   Attestation Statements:   Reviewed by clinician on day of visit: allergies, medications, problem list, medical history, surgical history, family history, social history, and previous encounter notes.  I, Elnora Morrison, RMA am acting as transcriptionist for Coralie Common, MD. I have reviewed the above documentation for accuracy and completeness, and I agree with the above. - Coralie Common, MD

## 2022-11-02 NOTE — Telephone Encounter (Signed)
1/3 Patient needs refill on vitamin d and blood pressure medication. Megan Mitchell

## 2022-11-03 ENCOUNTER — Other Ambulatory Visit: Payer: Self-pay | Admitting: Family Medicine

## 2022-11-03 ENCOUNTER — Other Ambulatory Visit (HOSPITAL_COMMUNITY): Payer: Self-pay

## 2022-11-03 ENCOUNTER — Telehealth: Payer: Self-pay | Admitting: Physician Assistant

## 2022-11-03 ENCOUNTER — Other Ambulatory Visit (INDEPENDENT_AMBULATORY_CARE_PROVIDER_SITE_OTHER): Payer: Self-pay | Admitting: Family Medicine

## 2022-11-03 DIAGNOSIS — I10 Essential (primary) hypertension: Secondary | ICD-10-CM

## 2022-11-03 DIAGNOSIS — E559 Vitamin D deficiency, unspecified: Secondary | ICD-10-CM

## 2022-11-03 MED ORDER — TRIAMTERENE-HCTZ 37.5-25 MG PO TABS
1.0000 | ORAL_TABLET | Freq: Every day | ORAL | 0 refills | Status: DC
  Filled 2022-11-03: qty 90, 90d supply, fill #0

## 2022-11-03 NOTE — Telephone Encounter (Signed)
Spoke to pt told her Rx for Maxide 37.5-25 mg sent to pharmacy. Pt verbalized understanding.

## 2022-11-03 NOTE — Telephone Encounter (Signed)
Pt is scheduled to come in 11/07/22 to discuss medication refills. She is asking if rx can be filled with enough to last until that appt. Both were ordered by a previous provider. Please advise.   triamterene-hydrochlorothiazide (MAXZIDE-25) 37.5-25 MG tablet

## 2022-11-04 ENCOUNTER — Other Ambulatory Visit (HOSPITAL_COMMUNITY): Payer: Self-pay

## 2022-11-07 ENCOUNTER — Encounter: Payer: Self-pay | Admitting: Physician Assistant

## 2022-11-07 ENCOUNTER — Ambulatory Visit: Payer: Commercial Managed Care - PPO | Admitting: Physician Assistant

## 2022-11-07 VITALS — BP 124/80 | HR 76 | Temp 98.0°F | Ht 68.0 in | Wt 277.0 lb

## 2022-11-07 DIAGNOSIS — E7849 Other hyperlipidemia: Secondary | ICD-10-CM | POA: Diagnosis not present

## 2022-11-07 NOTE — Progress Notes (Signed)
Megan Mitchell is a 52 y.o. female here for a follow up of a pre-existing problem.  History of Present Illness:   Chief Complaint  Patient presents with   Medication Refill    HLD Currently not taking any oral medication for HLD. MGF with hx of MI Mom and MGM have HTN. She is wondering if she needs statin  The 10-year ASCVD risk score (Arnett DK, et al., 2019) is: 3.5%   Values used to calculate the score:     Age: 63 years     Sex: Female     Is Non-Hispanic African American: Yes     Diabetic: No     Tobacco smoker: No     Systolic Blood Pressure: 244 mmHg     Is BP treated: Yes     HDL Cholesterol: 67 mg/dL     Total Cholesterol: 230 mg/dL     Past Medical History:  Diagnosis Date   Allergy    seasonal   Anemia    Anxiety    Arthritis    left knee   Fibroids    Hypertension    Ovarian cyst      Social History   Tobacco Use   Smoking status: Never   Smokeless tobacco: Never  Vaping Use   Vaping Use: Never used  Substance Use Topics   Alcohol use: No   Drug use: No    Past Surgical History:  Procedure Laterality Date   CHOLECYSTECTOMY     COLONOSCOPY     ELBOW SURGERY     ROBOTIC ASSISTED TOTAL HYSTERECTOMY WITH BILATERAL SALPINGO OOPHERECTOMY Bilateral 05/11/2021   Procedure: XI ROBOTIC ASSISTED TOTAL HYSTERECTOMY GREATER THAN 250 GRAMS WITH BILATERAL SALPINGO OOPHORECTOMY;MINI LAPAROTOMY FOR CYST DECOMPRESSION;OMENTECTOMY;  Surgeon: Everitt Amber, MD;  Location: [facility];  Service: Gynecology;  Laterality: Bilateral;   TUBAL LIGATION      Family History  Problem Relation Age of Onset   Hypertension Mother    Hypertension Maternal Grandmother    Heart attack Maternal Grandfather    Hypertension Maternal Grandfather    Colon polyps Neg Hx    Esophageal cancer Neg Hx    Rectal cancer Neg Hx    Stomach cancer Neg Hx    Breast cancer Neg Hx    Colon cancer Neg Hx     Allergies  Allergen Reactions   Sulfa Antibiotics Rash    Makes mouth break  out with a rash    Current Medications:   Current Outpatient Medications:    amLODipine (NORVASC) 10 MG tablet, TAKE 1 TABLET BY MOUTH DAILY., Disp: 90 tablet, Rfl: 3   Biotin w/ Vitamins C & E (HAIR/SKIN/NAILS PO), Take by mouth., Disp: , Rfl:    fluticasone (FLONASE) 50 MCG/ACT nasal spray, Place 2 sprays into both nostrils daily., Disp: 16 g, Rfl: 6   Insulin Pen Needle (TECHLITE PEN NEEDLES) 32G X 4 MM MISC, Use 1 needle into the skin 2 times daily as directed., Disp: 100 each, Rfl: 0   losartan (COZAAR) 50 MG tablet, Take 1 tablet (50 mg total) by mouth daily., Disp: 90 tablet, Rfl: 3   Multiple Vitamin (MULTIVITAMIN ADULT PO), Take by mouth., Disp: , Rfl:    Probiotic Product (FORTIFY PROBIOTIC WOMENS PO), Take by mouth., Disp: , Rfl:    Semaglutide-Weight Management (WEGOVY) 1 MG/0.5ML SOAJ, Inject 1 mg into the skin once a week., Disp: 2 mL, Rfl: 0   triamterene-hydrochlorothiazide (MAXZIDE-25) 37.5-25 MG tablet, Take 1 tablet by mouth daily.,  Disp: 90 tablet, Rfl: 0   Review of Systems:   ROS Negative unless otherwise specified per HPI.  Vitals:   Vitals:   11/07/22 1554  BP: 124/80  Pulse: 76  Temp: 98 F (36.7 C)  TempSrc: Temporal  Weight: 277 lb (125.6 kg)  Height: '5\' 8"'$  (1.727 m)     Body mass index is 42.12 kg/m.  Physical Exam:   Physical Exam Vitals and nursing note reviewed.  Constitutional:      General: She is not in acute distress.    Appearance: She is well-developed. She is not ill-appearing or toxic-appearing.  Cardiovascular:     Rate and Rhythm: Normal rate and regular rhythm.     Pulses: Normal pulses.     Heart sounds: Normal heart sounds, S1 normal and S2 normal.  Pulmonary:     Effort: Pulmonary effort is normal.     Breath sounds: Normal breath sounds.  Skin:    General: Skin is warm and dry.  Neurological:     Mental Status: She is alert.     GCS: GCS eye subscore is 4. GCS verbal subscore is 5. GCS motor subscore is 6.   Psychiatric:        Speech: Speech normal.        Behavior: Behavior normal. Behavior is cooperative.     Assessment and Plan:   Other hyperlipidemia No red flags Reviewed lipid panel and recommendations to start psyllium Will order calcium score for further evaluation Will provide additional recommendations based on results  Inda Coke, PA-C

## 2022-11-07 NOTE — Patient Instructions (Signed)
It was great to see you!  Start daily metamucil  I will order calcium score test for you at [facility] -- they should be in touch with you to schedule now  Take care,  Inda Coke PA-C

## 2022-11-08 ENCOUNTER — Ambulatory Visit (INDEPENDENT_AMBULATORY_CARE_PROVIDER_SITE_OTHER): Payer: Commercial Managed Care - PPO

## 2022-11-08 ENCOUNTER — Ambulatory Visit (INDEPENDENT_AMBULATORY_CARE_PROVIDER_SITE_OTHER): Payer: Commercial Managed Care - PPO | Admitting: Physician Assistant

## 2022-11-08 ENCOUNTER — Encounter: Payer: Self-pay | Admitting: Physician Assistant

## 2022-11-08 DIAGNOSIS — M25561 Pain in right knee: Secondary | ICD-10-CM | POA: Diagnosis not present

## 2022-11-08 DIAGNOSIS — G8929 Other chronic pain: Secondary | ICD-10-CM

## 2022-11-08 DIAGNOSIS — M170 Bilateral primary osteoarthritis of knee: Secondary | ICD-10-CM | POA: Diagnosis not present

## 2022-11-08 MED ORDER — BUPIVACAINE HCL 0.25 % IJ SOLN
2.0000 mL | INTRAMUSCULAR | Status: AC | PRN
  Administered 2022-11-08: 2 mL via INTRA_ARTICULAR

## 2022-11-08 MED ORDER — METHYLPREDNISOLONE ACETATE 40 MG/ML IJ SUSP
80.0000 mg | INTRAMUSCULAR | Status: AC | PRN
  Administered 2022-11-08: 80 mg via INTRA_ARTICULAR

## 2022-11-08 MED ORDER — LIDOCAINE HCL 1 % IJ SOLN
2.0000 mL | INTRAMUSCULAR | Status: AC | PRN
  Administered 2022-11-08: 2 mL

## 2022-11-08 NOTE — Progress Notes (Signed)
Office Visit Note   Patient: Megan Mitchell           Date of Birth: August 01, 1971           MRN: [id number] Visit Date: 11/08/2022              Requested by: Inda Coke, [address] [place],  [place] [address] PCP: Inda Coke, [place]  Chief Complaint  Patient presents with  . Right Knee - Pain      HPI: Patient is a pleasant 52 year old woman with early to moderate arthritis of her bilateral knees.  She has gotten gel injections in her left knee in the past and done very well.  She now has been having some trouble with her right knee.  Denies any particular injury no fever chills she does recall being told by Dr. Junius Roads that she had a meniscus tear in the right knee but also had underlying arthritic changes  Assessment & Plan: Visit Diagnoses:  1. Chronic pain of right knee     Plan: Bilateral osteoarthritis of her knees.  Will give her a cortisone injection into her right knee today.  Will also go forward and authorize gel injections for her bilateral knees This patient is diagnosed with osteoarthritis of the knee(s).    Radiographs show evidence of joint space narrowing, osteophytes, subchondral sclerosis and/or subchondral cysts.  This patient has knee pain which interferes with functional and activities of daily living.    This patient has experienced inadequate response, adverse effects and/or intolerance with conservative treatments such as acetaminophen, NSAIDS, topical creams, physical therapy or regular exercise, knee bracing and/or weight loss.   This patient has experienced inadequate response or has a contraindication to intra articular steroid injections for at least 3 months.   This patient is not scheduled to have a total knee replacement within 6 months of starting treatment with viscosupplementation.  Follow-Up Instructions: No follow-ups on file.   Ortho Exam  Patient is alert, oriented, no adenopathy, well-dressed, normal affect, normal  respiratory effort. Bilateral knees no effusion no erythema no swelling.  Compartments are soft and nontender.  She does have grinding with range of motion on the right knee she has pain more over the medial joint line rather than the lateral joint line  Imaging: No results found. No images are attached to the encounter.  Labs: Lab Results  Component Value Date   HGBA1C 5.6 10/18/2022   HGBA1C 5.8 (H) 02/17/2022   HGBA1C 5.5 11/04/2020   REPTSTATUS 05/06/2021 FINAL 05/04/2021   CULT (A) 05/04/2021    >=100,000 COLONIES/mL MULTIPLE SPECIES PRESENT, SUGGEST RECOLLECTION   LABORGA STAPHYLOCOCCUS AUREUS 07/31/2015     Lab Results  Component Value Date   ALBUMIN 4.5 10/18/2022   ALBUMIN 4.3 02/17/2022   ALBUMIN 4.8 09/13/2021    No results found for: "MG" Lab Results  Component Value Date   VD25OH 53.1 10/18/2022   VD25OH 30.7 02/17/2022    No results found for: "PREALBUMIN"    Latest Ref Rng & Units 10/18/2022    4:41 PM 02/17/2022    9:02 AM 09/13/2021    4:06 PM  CBC EXTENDED  WBC 3.4 - 10.8 x10E3/uL 7.1  6.4  7.7   RBC 3.77 - 5.28 x10E6/uL 5.16  4.68  4.91   Hemoglobin 11.1 - 15.9 g/dL 12.1  11.7  11.6   HCT 34.0 - 46.6 % 39.1  36.0  36.3   Platelets 150 - 450 x10E3/uL 509  476  547   NEUT# 1.4 - 7.0 x10E3/uL  3.8  3.7   Lymph# 0.7 - 3.1 x10E3/uL  2.0  3.2      There is no height or weight on file to calculate BMI.  Orders:  Orders Placed This Encounter  Procedures  . XR KNEE 3 VIEW RIGHT   No orders of the defined types were placed in this encounter.    Procedures: Large Joint Inj: R knee on 11/08/2022 4:02 PM Indications: pain and diagnostic evaluation Details: 25 G 1.5 in needle, anteromedial approach  Arthrogram: No  Medications: 80 mg methylPREDNISolone acetate 40 MG/ML; 2 mL lidocaine 1 %; 2 mL bupivacaine 0.25 % Outcome: tolerated well, no immediate complications Procedure, treatment alternatives, risks and benefits explained, specific risks  discussed. Consent was given by the patient. Immediately prior to procedure a time out was called to verify the correct patient, procedure, equipment, support staff and site/side marked as required. Patient was prepped and draped in the usual sterile fashion.    Clinical Data: No additional findings.  ROS:  All other systems negative, except as noted in the HPI. Review of Systems  Objective: Vital Signs: LMP 06/14/2020 (Approximate)   Specialty Comments:  No specialty comments available.  PMFS History: Patient Active Problem List   Diagnosis Date Noted  . Pain in right knee 11/08/2022  . Upper respiratory tract infection 07/27/2022  . Pain in left knee 04/20/2022  . Urinary tract infection 05/11/2021  . Pelvic mass in female 04/08/2021  . Fibroids 04/08/2021  . Anemia 12/26/2014  . HTN (hypertension) 03/22/2014  . Severe obesity (BMI >= 40) ([place]) 03/22/2014   Past Medical History:  Diagnosis Date  . Allergy    seasonal  . Anemia   . Anxiety   . Arthritis    left knee  . Fibroids   . Hypertension   . Ovarian cyst     Family History  Problem Relation Age of Onset  . Hypertension Mother   . Hypertension Maternal Grandmother   . Heart attack Maternal Grandfather   . Hypertension Maternal Grandfather   . Colon polyps Neg Hx   . Esophageal cancer Neg Hx   . Rectal cancer Neg Hx   . Stomach cancer Neg Hx   . Breast cancer Neg Hx   . Colon cancer Neg Hx     Past Surgical History:  Procedure Laterality Date  . CHOLECYSTECTOMY    . COLONOSCOPY    . ELBOW SURGERY    . ROBOTIC ASSISTED TOTAL HYSTERECTOMY WITH BILATERAL SALPINGO OOPHERECTOMY Bilateral 05/11/2021   Procedure: XI ROBOTIC ASSISTED TOTAL HYSTERECTOMY GREATER THAN 250 GRAMS WITH BILATERAL SALPINGO OOPHORECTOMY;MINI LAPAROTOMY FOR CYST DECOMPRESSION;OMENTECTOMY;  Surgeon: Everitt Amber, MD;  Location: [facility];  Service: Gynecology;  Laterality: Bilateral;  . TUBAL LIGATION     Social History   Occupational  History  . 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 Smoking status: Never  . Smokeless tobacco: Never  Vaping Use  . Vaping Use: Never used  Substance and Sexual Activity  . Alcohol use: No  . Drug use: No  . Sexual activity: Yes    Birth control/protection: Surgical

## 2022-11-09 ENCOUNTER — Other Ambulatory Visit: Payer: Self-pay | Admitting: Physician Assistant

## 2022-11-09 ENCOUNTER — Telehealth: Payer: Self-pay

## 2022-11-09 ENCOUNTER — Other Ambulatory Visit (HOSPITAL_COMMUNITY): Payer: Self-pay

## 2022-11-09 DIAGNOSIS — I10 Essential (primary) hypertension: Secondary | ICD-10-CM

## 2022-11-09 MED ORDER — AMLODIPINE BESYLATE 10 MG PO TABS
ORAL_TABLET | Freq: Every day | ORAL | 0 refills | Status: DC
  Filled 2022-11-09: qty 90, 90d supply, fill #0

## 2022-11-09 NOTE — Telephone Encounter (Signed)
VOB submitted for Monovisc, bilateral knee  

## 2022-11-10 ENCOUNTER — Other Ambulatory Visit: Payer: Self-pay | Admitting: Nurse Practitioner

## 2022-11-10 ENCOUNTER — Other Ambulatory Visit (HOSPITAL_COMMUNITY): Payer: Self-pay

## 2022-11-10 DIAGNOSIS — Z1231 Encounter for screening mammogram for malignant neoplasm of breast: Secondary | ICD-10-CM

## 2022-11-11 ENCOUNTER — Telehealth: Payer: Self-pay | Admitting: Physician Assistant

## 2022-11-11 NOTE — Telephone Encounter (Signed)
Patient aware it could take up to two weeks to get approval from insurance and we will call her to make an appt when we get that authorization

## 2022-11-11 NOTE — Telephone Encounter (Signed)
Patient wanting to know if the request for gel injections have been ordered and when can she make an appt for them

## 2022-11-15 ENCOUNTER — Ambulatory Visit (INDEPENDENT_AMBULATORY_CARE_PROVIDER_SITE_OTHER): Payer: Commercial Managed Care - PPO | Admitting: Family Medicine

## 2022-11-15 ENCOUNTER — Encounter (INDEPENDENT_AMBULATORY_CARE_PROVIDER_SITE_OTHER): Payer: Self-pay | Admitting: Family Medicine

## 2022-11-15 VITALS — BP 111/73 | HR 80 | Temp 97.9°F | Ht 68.0 in | Wt 270.0 lb

## 2022-11-15 DIAGNOSIS — D509 Iron deficiency anemia, unspecified: Secondary | ICD-10-CM | POA: Diagnosis not present

## 2022-11-15 DIAGNOSIS — E559 Vitamin D deficiency, unspecified: Secondary | ICD-10-CM

## 2022-11-15 DIAGNOSIS — Z6841 Body Mass Index (BMI) 40.0 and over, adult: Secondary | ICD-10-CM

## 2022-11-15 DIAGNOSIS — E669 Obesity, unspecified: Secondary | ICD-10-CM

## 2022-11-15 DIAGNOSIS — I10 Essential (primary) hypertension: Secondary | ICD-10-CM

## 2022-11-15 MED ORDER — WEGOVY 1 MG/0.5ML ~~LOC~~ SOAJ: 1.0000 mg | SUBCUTANEOUS | 0 refills | Status: DC

## 2022-11-15 MED ORDER — VITAMIN D3 125 MCG (5000 UT) PO CAPS: 5000.0000 [IU] | ORAL_CAPSULE | Freq: Every day | ORAL | Status: DC

## 2022-11-18 ENCOUNTER — Other Ambulatory Visit (HOSPITAL_COMMUNITY): Payer: Self-pay

## 2022-11-18 ENCOUNTER — Other Ambulatory Visit (HOSPITAL_COMMUNITY): Payer: Commercial Managed Care - PPO

## 2022-11-22 ENCOUNTER — Telehealth: Payer: Self-pay | Admitting: Physician Assistant

## 2022-11-22 NOTE — Telephone Encounter (Signed)
Patient asking if she has gotten approved for her gel injection. Please advise

## 2022-11-23 ENCOUNTER — Telehealth: Payer: Self-pay

## 2022-11-23 NOTE — Progress Notes (Signed)
Chief Complaint:   OBESITY Megan Mitchell is here to discuss her progress with her obesity treatment plan along with follow-up of her obesity related diagnoses. Megan Mitchell is on the Category 3 Plan and keeping a food journal and adhering to recommended goals of 1550-1700 calories and 95+ grams of protein and states she is following her eating plan approximately 85% of the time. Megan Mitchell states she is bicycling 25 minutes 7 times per week.  Today's visit was #: 10 Starting weight: 273 lbs Starting date: 02/17/2022 Today's weight: 270 lbs Today's date: 11/15/2022 Total lbs lost to date: 3 lbs Total lbs lost since last in-office visit: 7  Interim History: Megan Mitchell had a good holiday season and stayed local and at home.  Doing well foodwise.  Has been keeping up with food she is taking in daily.  Focusing on protein and getting more consistent protein in daily.  Closer to 1500 cal daily.  Not much planned for next few weeks.  Subjective:   1. Microcytic anemia Megan Mitchell's MCV still low -H/H normal -Ferritin normal.  Interestingly Platelets elevated (have been elevated for 11 years).  2. Vitamin D deficiency Megan Mitchell is currently taking prescription Vit D 50,000 IU once a week. Low level now at goal.  Notes fatigue.  3. Essential hypertension Megan Mitchell's blood pressure very well controlled.  On 3 medications, Amlodipine, Losartan and Maxzide-25.  Assessment/Plan:   1. Microcytic anemia Follow up labs at next appointment; Consider referral to hematology.  2. Vitamin D deficiency Stop taking prescription Vit D 50,000 IU once a week. Change to 5k IU daily.  -Start Cholecalciferol (VITAMIN D3) 125 MCG (5000 UT) CAPS; Take 1 capsule (5,000 Units total) by mouth daily.  Dispense: 30 capsule  3. Essential hypertension Follow up with blood pressure at next appointment consider decrease Losartan if blood pressure stays well.  4. Obesity with current BMI of 41.2 Will refill Wegovy 1 mg SubQ once a week for 1 month  with 0 refills.  -Refill Semaglutide-Weight Management (WEGOVY) 1 MG/0.5ML SOAJ; Inject 1 mg into the skin once a week.  Dispense: 2 mL; Refill: 0  Megan Mitchell is currently in the action stage of change. As such, her goal is to continue with weight loss efforts. She has agreed to keeping a food journal and adhering to recommended goals of 1550-1700 calories and 95+grams of protein daily.   Exercise goals: All adults should avoid inactivity. Some physical activity is better than none, and adults who participate in any amount of physical activity gain some health benefits.  Behavioral modification strategies: increasing lean protein intake, meal planning and cooking strategies, keeping healthy foods in the home, and planning for success.  Megan Mitchell has agreed to follow-up with our clinic in 3 weeks. She was informed of the importance of frequent follow-up visits to maximize her success with intensive lifestyle modifications for her multiple health conditions.   Objective:   Blood pressure 111/73, pulse 80, temperature 97.9 F (36.6 C), height '5\' 8"'$  (1.727 m), weight 270 lb (122.5 kg), last menstrual period 06/14/2020, SpO2 97 %. Body mass index is 41.05 kg/m.  General: Cooperative, alert, well developed, in no acute distress. HEENT: Conjunctivae and lids unremarkable. Cardiovascular: Regular rhythm.  Lungs: Normal work of breathing. Neurologic: No focal deficits.   Lab Results  Component Value Date   CREATININE 0.85 10/18/2022   BUN 10 10/18/2022   NA 136 10/18/2022   K 3.7 10/18/2022   CL 97 10/18/2022   CO2 22 10/18/2022   Lab  Results  Component Value Date   ALT 17 10/18/2022   AST 21 10/18/2022   ALKPHOS 106 10/18/2022   BILITOT 0.6 10/18/2022   Lab Results  Component Value Date   HGBA1C 5.6 10/18/2022   HGBA1C 5.8 (H) 02/17/2022   HGBA1C 5.5 11/04/2020   HGBA1C 5.6 03/24/2020   HGBA1C 5.8 (H) 10/04/2019   Lab Results  Component Value Date   INSULIN 23.5 10/18/2022    INSULIN 28.8 (H) 02/17/2022   Lab Results  Component Value Date   TSH 1.580 02/03/2022   Lab Results  Component Value Date   CHOL 230 (H) 10/18/2022   HDL 67 10/18/2022   LDLCALC 146 (H) 10/18/2022   TRIG 96 10/18/2022   CHOLHDL 3.2 02/17/2022   Lab Results  Component Value Date   VD25OH 53.1 10/18/2022   VD25OH 30.7 02/17/2022   Lab Results  Component Value Date   WBC 7.1 10/18/2022   HGB 12.1 10/18/2022   HCT 39.1 10/18/2022   MCV 76 (L) 10/18/2022   PLT 509 (H) 10/18/2022   Lab Results  Component Value Date   IRON 51 10/18/2022   TIBC 467 (H) 10/18/2022   FERRITIN 48 10/18/2022   Attestation Statements:   Reviewed by clinician on day of visit: allergies, medications, problem list, medical history, surgical history, family history, social history, and previous encounter notes.  I, Elnora Morrison, RMA am acting as transcriptionist for Coralie Common, MD.  I have reviewed the above documentation for accuracy and completeness, and I agree with the above. - Coralie Common, MD

## 2022-11-23 NOTE — Telephone Encounter (Signed)
Talked with patient concerning gel injection.  

## 2022-11-23 NOTE — Telephone Encounter (Signed)
PA has been faxed to [facility] at [phone]. PA pending for Monovisc, bilateral knee.

## 2022-11-24 ENCOUNTER — Encounter: Payer: Self-pay | Admitting: Family

## 2022-11-24 ENCOUNTER — Telehealth: Payer: Self-pay

## 2022-11-24 ENCOUNTER — Ambulatory Visit (INDEPENDENT_AMBULATORY_CARE_PROVIDER_SITE_OTHER): Payer: Commercial Managed Care - PPO | Admitting: Family

## 2022-11-24 VITALS — BP 117/74 | HR 61 | Temp 97.0°F | Ht 68.0 in | Wt 277.2 lb

## 2022-11-24 DIAGNOSIS — M546 Pain in thoracic spine: Secondary | ICD-10-CM | POA: Diagnosis not present

## 2022-11-24 DIAGNOSIS — M170 Bilateral primary osteoarthritis of knee: Secondary | ICD-10-CM

## 2022-11-24 NOTE — Patient Instructions (Signed)
It was very nice to see you today!   As discussed, ok to continue the Back & Body pain reliever you took this am and take as needed, trying not to take more than twice a day. Can try over the counter Lidocaine pain patches as needed. Can apply a heating pad for up to 30 minutes several times per day to help with pain & heal the tissues. Look for a thoracic back cushion to use in the car to give you support. DON"T use a pillow! Should be firmer support.  If you can't find one, you can get a lumbar cushion and raise it up to your middle back area.  Be sure and wear compression socks  for you car trip - mild compression or 8-80mHg - will need an extra wide width to go around your calf.  And still need to stop often to get out and walk around to get your blood circulating.       PLEASE NOTE:  If you had any lab tests please let [facility] if you have not heard back within a few days. You may see your results on MyChart before we have a chance to review them but we will give you a call once they are reviewed by [facility] If we ordered any referrals today, please let [facility] if you have not heard from their office within the next week.

## 2022-11-24 NOTE — Progress Notes (Signed)
Patient ID: Megan Mitchell, female    DOB: [birth year], 52 y.o.   MRN: [id number]  Chief Complaint  Patient presents with   Back Pain    Pt c/o lower right sided back pain for about 3 days, Has tried Warm showers, ibuprofen which does help the pain. Pt states she has been exercising not sure if pulled muscle.     HPI:      Back pain:    Pt c/o middle right sided back pain for about 3 days, Has tried Warm showers, ibuprofen which does help the pain. Pt states she has been exercising not sure if she pulled a muscle. Reports feeling an ache or muscle spasm in right thoracic area. Does zumba and has been walking more to help with weight loss. Started Golf about 1 month ago but has not had any indigestion or heartburn. She wondered if pain could also be from gas. States she took OTC Back & Body pills this am and she has not felt the pain since.   Assessment & Plan:  1. Acute right-sided thoracic back pain - Believe musculoskeletal - mild tenderness with palpation - advised ok to continue the OTC med she is taking, but try to take prn, no more than bid for the next few days. Also can apply OTC Lidocaine pain patches. Recommend getting a back cushion for her car as she is driving 67[employer] in the next few days. Can also apply heat for up to 71mn tid to ease pain.    Subjective:    Outpatient Medications Prior to Visit  Medication Sig Dispense Refill   amLODipine (NORVASC) 10 MG tablet TAKE 1 TABLET BY MOUTH DAILY. 90 tablet 0   Biotin w/ Vitamins C & E (HAIR/SKIN/NAILS PO) Take by mouth.     Cholecalciferol (VITAMIN D3) 125 MCG (5000 UT) CAPS Take 1 capsule (5,000 Units total) by mouth daily. 30 capsule    fluticasone (FLONASE) 50 MCG/ACT nasal spray Place 2 sprays into both nostrils daily. 16 g 6   Insulin Pen Needle (TECHLITE PEN NEEDLES) 32G X 4 MM MISC Use 1 needle into the skin 2 times daily as directed. 100 each 0   losartan (COZAAR) 50 MG tablet Take 1 tablet (50 mg total) by mouth daily. 90  tablet 3   Multiple Vitamin (MULTIVITAMIN ADULT PO) Take by mouth.     Probiotic Product (FORTIFY PROBIOTIC WOMENS PO) Take by mouth.     Semaglutide-Weight Management (WEGOVY) 1 MG/0.5ML SOAJ Inject 1 mg into the skin once a week. 2 mL 0   triamterene-hydrochlorothiazide (MAXZIDE-25) 37.5-25 MG tablet Take 1 tablet by mouth daily. 90 tablet 0   No facility-administered medications prior to visit.   Past Medical History:  Diagnosis Date   Allergy    seasonal   Anemia    Anxiety    Arthritis    left knee   Fibroids    Hypertension    Ovarian cyst    Past Surgical History:  Procedure Laterality Date   CHOLECYSTECTOMY     COLONOSCOPY     ELBOW SURGERY     ROBOTIC ASSISTED TOTAL HYSTERECTOMY WITH BILATERAL SALPINGO OOPHERECTOMY Bilateral 05/11/2021   Procedure: XI ROBOTIC ASSISTED TOTAL HYSTERECTOMY GREATER THAN 250 GRAMS WITH BILATERAL SALPINGO OOPHORECTOMY;MINI LAPAROTOMY FOR CYST DECOMPRESSION;OMENTECTOMY;  Surgeon: REveritt Amber MD;  Location: [facility];  Service: Gynecology;  Laterality: Bilateral;   TUBAL LIGATION     Allergies  Allergen Reactions   Sulfa Antibiotics Rash    Makes  mouth break out with a rash      Objective:    Physical Exam Vitals and nursing note reviewed.  Constitutional:      Appearance: Normal appearance.  Cardiovascular:     Rate and Rhythm: Normal rate and regular rhythm.  Pulmonary:     Effort: Pulmonary effort is normal.     Breath sounds: Normal breath sounds.  Musculoskeletal:        General: Normal range of motion.     Thoracic back: Tenderness (mild) present. No edema or bony tenderness. Normal range of motion.  Skin:    General: Skin is warm and dry.  Neurological:     Mental Status: She is alert.  Psychiatric:        Mood and Affect: Mood normal.        Behavior: Behavior normal.    BP 117/74 (BP Location: Left Arm, Patient Position: Sitting, Cuff Size: Large)   Pulse 61   Temp (!) 97 F (36.1 C) (Temporal)   Ht '5\' 8"'$  (1.727 m)    Wt 277 lb 3.2 oz (125.7 kg)   LMP 06/14/2020 (Approximate)   SpO2 100%   BMI 42.15 kg/m  Wt Readings from Last 3 Encounters:  11/24/22 277 lb 3.2 oz (125.7 kg)  11/15/22 270 lb (122.5 kg)  11/07/22 277 lb (125.6 kg)       Jeanie Sewer, NP

## 2022-11-24 NOTE — Telephone Encounter (Signed)
Called and left a VM for patient to CB to schedule for gel injection with Bevely Palmer.  See referral tab

## 2022-11-28 ENCOUNTER — Telehealth: Payer: Self-pay | Admitting: Orthopaedic Surgery

## 2022-11-29 ENCOUNTER — Ambulatory Visit (INDEPENDENT_AMBULATORY_CARE_PROVIDER_SITE_OTHER): Payer: Commercial Managed Care - PPO | Admitting: Physician Assistant

## 2022-11-29 ENCOUNTER — Encounter: Payer: Self-pay | Admitting: Physician Assistant

## 2022-11-29 DIAGNOSIS — M170 Bilateral primary osteoarthritis of knee: Secondary | ICD-10-CM | POA: Diagnosis not present

## 2022-11-29 MED ORDER — HYALURONAN 88 MG/4ML IX SOSY
88.0000 mg | PREFILLED_SYRINGE | INTRA_ARTICULAR | Status: AC | PRN
  Administered 2022-11-29: 88 mg via INTRA_ARTICULAR

## 2022-11-29 NOTE — Progress Notes (Addendum)
Office Visit Note   Patient: Megan Mitchell           Date of Birth: 24-Dec-1970           MRN: [id number] Visit Date: 11/29/2022              Requested by: Inda Coke, [place] [address] [place],  [place] [address] PCP: Inda Coke, [place]  Chief Complaint  Patient presents with   Right Knee - Follow-up   Left Knee - Follow-up      HPI: Megan Mitchell is a pleasant for a 52-year-old woman who comes in today for her Monovisc injections into her bilateral knees.  She has had an injection into the  knee before and done quite well getting over 2 years of relief.  Assessment & Plan: Visit Diagnoses: Osteoarthritis bilateral knees  Plan: Monovisc injected into her knees today without difficulty may follow-up as needed.  Discussed that she could get steroid injections in between if she does have return of painful symptoms before 6 months  Follow-Up Instructions: No follow-ups on file.   Ortho Exam  Patient is alert, oriented, no adenopathy, well-dressed, normal affect, normal respiratory effort. Bilateral knees no effusion no erythema no warmth she is neurovascular intact compartments are soft and nontender  Imaging: No results found. No images are attached to the encounter.  Labs: Lab Results  Component Value Date   HGBA1C 5.6 10/18/2022   HGBA1C 5.8 (H) 02/17/2022   HGBA1C 5.5 11/04/2020   REPTSTATUS 05/06/2021 FINAL 05/04/2021   CULT (A) 05/04/2021    >=100,000 COLONIES/mL MULTIPLE SPECIES PRESENT, SUGGEST RECOLLECTION   LABORGA STAPHYLOCOCCUS AUREUS 07/31/2015     Lab Results  Component Value Date   ALBUMIN 4.5 10/18/2022   ALBUMIN 4.3 02/17/2022   ALBUMIN 4.8 09/13/2021    No results found for: "MG" Lab Results  Component Value Date   VD25OH 53.1 10/18/2022   VD25OH 30.7 02/17/2022    No results found for: "PREALBUMIN"    Latest Ref Rng & Units 10/18/2022    4:41 PM 02/17/2022    9:02 AM 09/13/2021    4:06 PM  CBC EXTENDED  WBC 3.4 - 10.8 x10E3/uL  7.1  6.4  7.7   RBC 3.77 - 5.28 x10E6/uL 5.16  4.68  4.91   Hemoglobin 11.1 - 15.9 g/dL 12.1  11.7  11.6   HCT 34.0 - 46.6 % 39.1  36.0  36.3   Platelets 150 - 450 x10E3/uL 509  476  547   NEUT# 1.4 - 7.0 x10E3/uL  3.8  3.7   Lymph# 0.7 - 3.1 x10E3/uL  2.0  3.2      There is no height or weight on file to calculate BMI.  Orders:  No orders of the defined types were placed in this encounter.  No orders of the defined types were placed in this encounter.    Procedures: Large Joint Inj: bilateral knee on 11/29/2022 2:27 PM Indications: pain and diagnostic evaluation Details: 1.5 in anteromedial approach  Arthrogram: No  Medications (Right): 88 mg Hyaluronan 88 MG/4ML Medications (Left): 88 mg Hyaluronan 88 MG/4ML Outcome: tolerated well, no immediate complications Procedure, treatment alternatives, risks and benefits explained, specific risks discussed. Consent was given by the patient.    Clinical Data: No additional findings.  ROS:  All other systems negative, except as noted in the HPI. Review of Systems  Objective: Vital Signs: LMP 06/14/2020 (Approximate)   Specialty Comments:  No specialty comments available.  PMFS History: Patient  Active Problem List   Diagnosis Date Noted   Pain in right knee 11/08/2022   Osteoarthritis of knees, bilateral 11/08/2022   Upper respiratory tract infection 07/27/2022   Pain in left knee 04/20/2022   Urinary tract infection 05/11/2021   Pelvic mass in female 04/08/2021   Fibroids 04/08/2021   Anemia 12/26/2014   HTN (hypertension) 03/22/2014   Severe obesity (BMI >= 40) ([place]) 03/22/2014   Past Medical History:  Diagnosis Date   Allergy    seasonal   Anemia    Anxiety    Arthritis    left knee   Fibroids    Hypertension    Ovarian cyst     Family History  Problem Relation Age of Onset   Hypertension Mother    Hypertension Maternal Grandmother    Heart attack Maternal Grandfather    Hypertension Maternal  Grandfather    Colon polyps Neg Hx    Esophageal cancer Neg Hx    Rectal cancer Neg Hx    Stomach cancer Neg Hx    Breast cancer Neg Hx    Colon cancer Neg Hx     Past Surgical History:  Procedure Laterality Date   CHOLECYSTECTOMY     COLONOSCOPY     ELBOW SURGERY     ROBOTIC ASSISTED TOTAL HYSTERECTOMY WITH BILATERAL SALPINGO OOPHERECTOMY Bilateral 05/11/2021   Procedure: XI ROBOTIC ASSISTED TOTAL HYSTERECTOMY GREATER THAN 250 GRAMS WITH BILATERAL SALPINGO OOPHORECTOMY;MINI LAPAROTOMY FOR CYST DECOMPRESSION;OMENTECTOMY;  Surgeon: Everitt Amber, MD;  Location: [facility];  Service: Gynecology;  Laterality: Bilateral;   TUBAL LIGATION     Social History   Occupational History   Occupation: Location manager Account Rep  Tobacco Use   Smoking status: Never   Smokeless tobacco: Never  Vaping Use   Vaping Use: Never used  Substance and Sexual Activity   Alcohol use: No   Drug use: No   Sexual activity: Yes    Birth control/protection: Surgical

## 2022-11-30 ENCOUNTER — Ambulatory Visit (HOSPITAL_COMMUNITY)
Admission: RE | Admit: 2022-11-30 | Discharge: 2022-11-30 | Disposition: A | Discharge status: Home / Self Care | Payer: Commercial Managed Care - PPO | Source: Ambulatory Visit | Attending: Physician Assistant | Admitting: Physician Assistant

## 2022-11-30 DIAGNOSIS — E7849 Other hyperlipidemia: Secondary | ICD-10-CM | POA: Insufficient documentation

## 2022-12-01 ENCOUNTER — Telehealth: Payer: Self-pay | Admitting: Physician Assistant

## 2022-12-01 DIAGNOSIS — E7800 Pure hypercholesterolemia, unspecified: Secondary | ICD-10-CM

## 2022-12-01 DIAGNOSIS — R931 Abnormal findings on diagnostic imaging of heart and coronary circulation: Secondary | ICD-10-CM

## 2022-12-01 NOTE — Telephone Encounter (Signed)
Patient states: - She saw PCP notes on CT cardiac soring results.  - She would like to know if there are any side effects with starting a cholesterol med while on her current meds - Also wants to know if PCP finds it absolutely necessary to have a referral to cardiology or not   Patient requests: -Callback after 3:30 with answers to her questions

## 2022-12-02 ENCOUNTER — Encounter: Payer: Self-pay | Admitting: Family

## 2022-12-02 ENCOUNTER — Ambulatory Visit (INDEPENDENT_AMBULATORY_CARE_PROVIDER_SITE_OTHER): Payer: Commercial Managed Care - PPO | Admitting: Family

## 2022-12-02 ENCOUNTER — Other Ambulatory Visit (HOSPITAL_COMMUNITY): Payer: Self-pay

## 2022-12-02 VITALS — BP 120/80 | HR 79 | Temp 97.8°F | Ht 68.0 in | Wt 274.0 lb

## 2022-12-02 DIAGNOSIS — J0110 Acute frontal sinusitis, unspecified: Secondary | ICD-10-CM | POA: Diagnosis not present

## 2022-12-02 MED ORDER — ATORVASTATIN CALCIUM 20 MG PO TABS
20.0000 mg | ORAL_TABLET | Freq: Every day | ORAL | 1 refills | Status: DC
  Filled 2022-12-02: qty 90, 90d supply, fill #0
  Filled 2023-02-24: qty 90, 90d supply, fill #1

## 2022-12-02 MED ORDER — AMOXICILLIN-POT CLAVULANATE 875-125 MG PO TABS
1.0000 | ORAL_TABLET | Freq: Two times a day (BID) | ORAL | 0 refills | Status: DC
  Filled 2022-12-02: qty 10, 5d supply, fill #0

## 2022-12-02 NOTE — Telephone Encounter (Signed)
Spoke to pt told her it is important to see Cardiology due to plaque build up in your arteries so they can evaluate you to see if they need to do anything else. Pt verbalized understanding. Told pt it is also important to start Cholesterol medication to help lower your cholesterol and plaque build up. Pt verbalized understanding and agreeable to see Cardiology and starting medication. Told pt I will place referral for Cardiology and someone will contact you to schedule an appt and also send Rx for Lipitor to the pharmacy. Pt verbalized understanding. Referral placed in Epic and Rx sent to pharmacy for Lipitor  mg one tablet daily per [facility].

## 2022-12-02 NOTE — Progress Notes (Signed)
Patient ID: Megan Mitchell, female    DOB: Aug 10, 1971, 52 y.o.   MRN: [id number]  Chief Complaint  Patient presents with   Sinus Problem    Pt c/o Nasal congestion with yellow/clear mucus, headache, facial pain, Present since Tuesday. Has tried vicks rub, Theraflu which did help but worse through out the day. Covid negative on Wednesday. Refused flu test.     HPI:      Sinus sx:  reports sx 2 weeks ago, but felt better for a week, but then sx have returned and having a lot of facial pressure, headaches, nasal drainage and congestion. Pt went out of town and forgot her Flonase, but restarted on Tuesday, but no improvement in her sx.       Assessment & Plan:  1. Acute non-recurrent frontal sinusitis sending Augmentin - advised on use & SE. Increase Flonase to bid until sx better, then reduce to qd, take Tylenol prn for pain/Fever, saline nasal spray tid and prior to Flonase, increase water intake and use humidifier overnight.  - amoxicillin-clavulanate (AUGMENTIN) 875-125 MG tablet; Take 1 tablet by mouth 2 (two) times daily after a meal.  Dispense: 10 tablet; Refill: 0   Subjective:    Outpatient Medications Prior to Visit  Medication Sig Dispense Refill   amLODipine (NORVASC) 10 MG tablet TAKE 1 TABLET BY MOUTH DAILY. 90 tablet 0   atorvastatin (LIPITOR) 20 MG tablet Take 1 tablet (20 mg total) by mouth daily. 90 tablet 1   Biotin w/ Vitamins C & E (HAIR/SKIN/NAILS PO) Take by mouth.     Cholecalciferol (VITAMIN D3) 125 MCG (5000 UT) CAPS Take 1 capsule (5,000 Units total) by mouth daily. 30 capsule    fluticasone (FLONASE) 50 MCG/ACT nasal spray Place 2 sprays into both nostrils daily. 16 g 6   Insulin Pen Needle (TECHLITE PEN NEEDLES) 32G X 4 MM MISC Use 1 needle into the skin 2 times daily as directed. 100 each 0   losartan (COZAAR) 50 MG tablet Take 1 tablet (50 mg total) by mouth daily. 90 tablet 3   Multiple Vitamin (MULTIVITAMIN ADULT PO) Take by mouth.     Probiotic Product  (FORTIFY PROBIOTIC WOMENS PO) Take by mouth.     Semaglutide-Weight Management (WEGOVY) 1 MG/0.5ML SOAJ Inject 1 mg into the skin once a week. 2 mL 0   triamterene-hydrochlorothiazide (MAXZIDE-25) 37.5-25 MG tablet Take 1 tablet by mouth daily. 90 tablet 0   No facility-administered medications prior to visit.   Past Medical History:  Diagnosis Date   Allergy    seasonal   Anemia    Anxiety    Arthritis    left knee   Fibroids    Hypertension    Ovarian cyst    Past Surgical History:  Procedure Laterality Date   CHOLECYSTECTOMY     COLONOSCOPY     ELBOW SURGERY     ROBOTIC ASSISTED TOTAL HYSTERECTOMY WITH BILATERAL SALPINGO OOPHERECTOMY Bilateral 05/11/2021   Procedure: XI ROBOTIC ASSISTED TOTAL HYSTERECTOMY GREATER THAN 250 GRAMS WITH BILATERAL SALPINGO OOPHORECTOMY;MINI LAPAROTOMY FOR CYST DECOMPRESSION;OMENTECTOMY;  Surgeon: Everitt Amber, MD;  Location: [facility];  Service: Gynecology;  Laterality: Bilateral;   TUBAL LIGATION     Allergies  Allergen Reactions   Sulfa Antibiotics Rash    Makes mouth break out with a rash      Objective:    Physical Exam Vitals and nursing note reviewed.  Constitutional:      Appearance: Normal appearance. She is ill-appearing.  Interventions: Face mask in place.  HENT:     Right Ear: Tympanic membrane and ear canal normal.     Left Ear: Tympanic membrane and ear canal normal.     Nose:     Right Sinus: Frontal sinus tenderness present.     Left Sinus: Frontal sinus tenderness present.     Mouth/Throat:     Mouth: Mucous membranes are moist.     Pharynx: Posterior oropharyngeal erythema present. No pharyngeal swelling, oropharyngeal exudate or uvula swelling.     Tonsils: No tonsillar exudate or tonsillar abscesses.  Cardiovascular:     Rate and Rhythm: Normal rate and regular rhythm.  Pulmonary:     Effort: Pulmonary effort is normal.     Breath sounds: Normal breath sounds.  Musculoskeletal:        General: Normal range of  motion.  Lymphadenopathy:     Head:     Right side of head: No preauricular or posterior auricular adenopathy.     Left side of head: No preauricular or posterior auricular adenopathy.     Cervical: No cervical adenopathy.  Skin:    General: Skin is warm and dry.  Neurological:     Mental Status: She is alert.  Psychiatric:        Mood and Affect: Mood normal.        Behavior: Behavior normal.    BP 120/80 (BP Location: Left Arm, Patient Position: Sitting, Cuff Size: Large)   Pulse 79   Temp 97.8 F (36.6 C) (Temporal)   Ht '5\' 8"'$  (1.727 m)   Wt 274 lb (124.3 kg)   LMP 06/14/2020 (Approximate)   SpO2 95%   BMI 41.66 kg/m  Wt Readings from Last 3 Encounters:  12/02/22 274 lb (124.3 kg)  11/24/22 277 lb 3.2 oz (125.7 kg)  11/15/22 270 lb (122.5 kg)       Jeanie Sewer, NP

## 2022-12-09 ENCOUNTER — Ambulatory Visit: Payer: Commercial Managed Care - PPO | Admitting: Orthopaedic Surgery

## 2022-12-10 ENCOUNTER — Other Ambulatory Visit (HOSPITAL_COMMUNITY): Payer: Self-pay

## 2022-12-12 ENCOUNTER — Telehealth: Payer: Self-pay | Admitting: Physician Assistant

## 2022-12-12 ENCOUNTER — Other Ambulatory Visit (HOSPITAL_COMMUNITY): Payer: Self-pay

## 2022-12-12 MED ORDER — FLUCONAZOLE 150 MG PO TABS
150.0000 mg | ORAL_TABLET | Freq: Once | ORAL | 0 refills | Status: AC
  Filled 2022-12-12 – 2023-01-11 (×2): qty 1, 1d supply, fill #0

## 2022-12-12 NOTE — Telephone Encounter (Signed)
Okay to send in one Diflucan tablet?

## 2022-12-12 NOTE — Telephone Encounter (Signed)
Left message on voicemail Rx was sent to [facility] as requested. Any questions please call office.

## 2022-12-12 NOTE — Telephone Encounter (Signed)
Pt came in for a sinus infection and was put on Amoxicillin, she forgot to ask for an RX because she is prone to get a yeast infection because of antibiotic. She would like the 1 days RX for yeast infection, Please advise.

## 2022-12-12 NOTE — Telephone Encounter (Signed)
[place] Phone: [phone]  Fax: [phone]

## 2022-12-13 ENCOUNTER — Encounter (INDEPENDENT_AMBULATORY_CARE_PROVIDER_SITE_OTHER): Payer: Self-pay | Admitting: Family Medicine

## 2022-12-13 ENCOUNTER — Ambulatory Visit (INDEPENDENT_AMBULATORY_CARE_PROVIDER_SITE_OTHER): Payer: Commercial Managed Care - PPO | Admitting: Family Medicine

## 2022-12-13 VITALS — BP 99/67 | HR 86 | Temp 98.4°F | Ht 68.0 in | Wt 271.0 lb

## 2022-12-13 DIAGNOSIS — I10 Essential (primary) hypertension: Secondary | ICD-10-CM

## 2022-12-13 DIAGNOSIS — E7849 Other hyperlipidemia: Secondary | ICD-10-CM

## 2022-12-13 DIAGNOSIS — Z6841 Body Mass Index (BMI) 40.0 and over, adult: Secondary | ICD-10-CM | POA: Diagnosis not present

## 2022-12-13 DIAGNOSIS — E669 Obesity, unspecified: Secondary | ICD-10-CM | POA: Diagnosis not present

## 2022-12-13 MED ORDER — WEGOVY 1.7 MG/0.75ML ~~LOC~~ SOAJ: 1.7000 mg | SUBCUTANEOUS | 0 refills | Status: DC

## 2022-12-13 MED ORDER — LOSARTAN POTASSIUM 50 MG PO TABS: 25.0000 mg | ORAL_TABLET | Freq: Every day | ORAL | 3 refills | Status: DC

## 2022-12-13 NOTE — Progress Notes (Deleted)
Patient has followed plans 85% of time; alternating between pescatarian and Cat 3.  She is enjoying the Pescatarian plan alternating between chicken, [place], fish.  She doesn't feel like she tolerates beef as well. Able to get all food in comfortably.  May have had more salt at [facility].  Next few weeks she has plans to go to a rodeo in [place].  She is very much looking forward to this.  Has been getting in a good amount of chicken, beans and protein shakes to help ensure she is hitting her protein goals daily.

## 2022-12-14 ENCOUNTER — Ambulatory Visit: Payer: Commercial Managed Care - PPO | Attending: Cardiovascular Disease | Admitting: Cardiovascular Disease

## 2022-12-14 ENCOUNTER — Encounter: Payer: Self-pay | Admitting: Cardiovascular Disease

## 2022-12-14 VITALS — BP 110/70 | HR 69 | Ht 67.0 in | Wt 276.6 lb

## 2022-12-14 DIAGNOSIS — E785 Hyperlipidemia, unspecified: Secondary | ICD-10-CM

## 2022-12-14 DIAGNOSIS — I10 Essential (primary) hypertension: Secondary | ICD-10-CM

## 2022-12-14 DIAGNOSIS — R931 Abnormal findings on diagnostic imaging of heart and coronary circulation: Secondary | ICD-10-CM

## 2022-12-14 NOTE — Assessment & Plan Note (Signed)
History of hyperlipidemia recently started on atorvastatin 20 mg a day followed by her PCP.  Her most recent lipid profile performed 10/18/2022 revealed a total cholesterol of 230, LDL of 146 and HDL of 67.  She recently had a coronary calcium score which was 11.  I would prefer her LDL to be significantly lower than 100.

## 2022-12-14 NOTE — Addendum Note (Signed)
Addended by: Beatrix Fetters on: 12/14/2022 04:03 PM   Modules accepted: Orders

## 2022-12-14 NOTE — Patient Instructions (Signed)
Medication Instructions:  Your physician recommends that you continue on your current medications as directed. Please refer to the Current Medication list given to you today.  *If you need a refill on your cardiac medications before your next appointment, please call your pharmacy*  Follow-Up: At [facility], you and your health needs are our priority.  As part of our continuing mission to provide you with exceptional heart care, we have created designated Provider Care Teams.  These Care Teams include your primary Cardiologist (physician) and Advanced Practice Providers (APPs -  Physician Assistants and Nurse Practitioners) who all work together to provide you with the care you need, when you need it.  We recommend signing up for the patient portal called "MyChart".  Sign up information is provided on this After Visit Summary.  MyChart is used to connect with patients for Virtual Visits (Telemedicine).  Patients are able to view lab/test results, encounter notes, upcoming appointments, etc.  Non-urgent messages can be sent to your provider as well.   To learn more about what you can do with MyChart, go to [email or web address].    Your next appointment:   We will see you on an as needed basis.  Provider:   Quay Burow, MD

## 2022-12-14 NOTE — Assessment & Plan Note (Signed)
History of essential hypertension blood pressure measured today 110/70.  She is on amlodipine, losartan and Maxide.

## 2022-12-14 NOTE — Progress Notes (Signed)
12/14/2022 Megan Mitchell   1971/01/15  [id number]  Primary Physician Inda Coke, PA Primary Cardiologist: Lorretta Harp MD Lupe Carney, [place]  HPI:  Megan Mitchell is a 52 y.o. morbidly overweight married African-American female mother of 2, grandmother of 2 grandchildren referred by Inda Coke, PA-C, her PCP, for recently obtained coronary calcium score which was mildly elevated.  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  She has been [employer] for 23 years.  Risk factors include treated hypertension and hyperlipidemia.  There is no family history.  She is never had a heart tach or stroke.  She denies chest pain or shortness of breath.  She does walk 4 times a week.  She was started on Wegovy by her PCP.  She did have a coronary calcium score performed 11/30/2022 which was 11 all of which was in the circumflex.   Current Meds  Medication Sig   amLODipine (NORVASC) 10 MG tablet TAKE 1 TABLET BY MOUTH DAILY.   amoxicillin-clavulanate (AUGMENTIN) 875-125 MG tablet Take 1 tablet by mouth 2 (two) times daily after a meal.   atorvastatin (LIPITOR) 20 MG tablet Take 1 tablet (20 mg total) by mouth daily.   Biotin w/ Vitamins C & E (HAIR/SKIN/NAILS PO) Take by mouth.   Cholecalciferol (VITAMIN D3) 125 MCG (5000 UT) CAPS Take 1 capsule (5,000 Units total) by mouth daily.   fluticasone (FLONASE) 50 MCG/ACT nasal spray Place 2 sprays into both nostrils daily.   Insulin Pen Needle (TECHLITE PEN NEEDLES) 32G X 4 MM MISC Use 1 needle into the skin 2 times daily as directed.   losartan (COZAAR) 50 MG tablet Take 0.5 tablets (25 mg total) by mouth daily.   Multiple Vitamin (MULTIVITAMIN ADULT PO) Take by mouth.   Probiotic Product (FORTIFY PROBIOTIC WOMENS PO) Take by mouth.   Semaglutide-Weight Management (WEGOVY) 1.7 MG/0.75ML SOAJ Inject 1.7 mg into the skin once a week.   triamterene-hydrochlorothiazide (MAXZIDE-25) 37.5-25 MG tablet Take 1 tablet by mouth daily.      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  Tobacco Use   Smoking status: Never   Smokeless tobacco: Never  Vaping Use   Vaping Use: Never used  Substance and Sexual Activity   Alcohol use: No   Drug use: No   Sexual activity: Yes    Birth control/protection: Surgical  Other Topics Concern   Not on file  Social History Narrative   Not on file   Social Determinants of Health   Financial Resource Strain: Not on file  Food Insecurity: Not on file  Transportation Needs: Not on file  Physical Activity: Not on file  Stress: Not on file  Social Connections: Not on file  Intimate Partner Violence: Not on file     Review of Systems: General: negative for chills, fever, night sweats or weight changes.  Cardiovascular: negative for chest pain, dyspnea on exertion, edema, orthopnea, palpitations, paroxysmal nocturnal dyspnea or shortness of breath Dermatological: negative for rash Respiratory: negative for cough or wheezing Urologic: negative for hematuria Abdominal: negative for nausea, vomiting, diarrhea, bright red  blood per rectum, melena, or hematemesis Neurologic: negative for visual changes, syncope, or dizziness All other systems reviewed and are otherwise negative except as noted above.    Blood pressure 110/70, pulse 69, height 5' 7"$  (1.702 m), weight 276 lb 9.6 oz (125.5 kg), last menstrual period 06/14/2020, SpO2 98 %.  General appearance: alert and no distress Neck: no adenopathy, no carotid bruit, no JVD, supple, symmetrical, trachea midline, and thyroid not enlarged, symmetric, no tenderness/mass/nodules Lungs: clear to auscultation  bilaterally Heart: regular rate and rhythm, S1, S2 normal, no murmur, click, rub or gallop Extremities: extremities normal, atraumatic, no cyanosis or edema Pulses: 2+ and symmetric Skin: Skin color, texture, turgor normal. No rashes or lesions Neurologic: Grossly normal  EKG sinus rhythm at 69 without ST or T wave changes.  Personally reviewed this EKG.  ASSESSMENT AND PLAN:   HTN (hypertension) History of essential hypertension blood pressure measured today 110/70.  She is on amlodipine, losartan and Maxide.  Severe obesity (BMI >= 40) ([facility]) History of morbid obesity with a BMI of 43 on Wegovy per  [facility].  Hyperlipidemia History of hyperlipidemia recently started on atorvastatin 20 mg a day followed by her PCP.  Her most recent lipid profile performed 10/18/2022 revealed a total cholesterol of 230, LDL of 146 and HDL of 67.  She recently had a coronary calcium score which was 11.  I would prefer her LDL to be significantly lower than 100.  Elevated coronary artery calcium score Coronary calcium score performed 12/01/2022 was 11 all of which was in the left circumflex.  She is completely asymptomatic.  I reassured her that this is a relatively low number but does warrant risk factor modification including treating her hyperlipidemia.     Lorretta Harp MD FACP,FACC,FAHA, [facility] 12/14/2022 4:00 PM

## 2022-12-14 NOTE — Assessment & Plan Note (Signed)
History of morbid obesity with a BMI of 43 on Wegovy per  [facility].

## 2022-12-14 NOTE — Assessment & Plan Note (Signed)
Coronary calcium score performed 12/01/2022 was 11 all of which was in the left circumflex.  She is completely asymptomatic.  I reassured her that this is a relatively low number but does warrant risk factor modification including treating her hyperlipidemia.

## 2022-12-20 ENCOUNTER — Ambulatory Visit: Payer: Commercial Managed Care - PPO | Admitting: Orthopaedic Surgery

## 2022-12-20 ENCOUNTER — Other Ambulatory Visit (HOSPITAL_COMMUNITY): Payer: Self-pay

## 2022-12-23 ENCOUNTER — Other Ambulatory Visit (HOSPITAL_COMMUNITY): Payer: Self-pay

## 2022-12-26 DIAGNOSIS — M7918 Myalgia, other site: Secondary | ICD-10-CM | POA: Diagnosis not present

## 2022-12-26 DIAGNOSIS — M9903 Segmental and somatic dysfunction of lumbar region: Secondary | ICD-10-CM | POA: Diagnosis not present

## 2022-12-26 DIAGNOSIS — M545 Low back pain, unspecified: Secondary | ICD-10-CM | POA: Diagnosis not present

## 2022-12-26 DIAGNOSIS — M9905 Segmental and somatic dysfunction of pelvic region: Secondary | ICD-10-CM | POA: Diagnosis not present

## 2022-12-26 DIAGNOSIS — M25651 Stiffness of right hip, not elsewhere classified: Secondary | ICD-10-CM | POA: Diagnosis not present

## 2022-12-26 DIAGNOSIS — M9904 Segmental and somatic dysfunction of sacral region: Secondary | ICD-10-CM | POA: Diagnosis not present

## 2022-12-26 DIAGNOSIS — M5136 Other intervertebral disc degeneration, lumbar region: Secondary | ICD-10-CM | POA: Diagnosis not present

## 2022-12-26 DIAGNOSIS — M25652 Stiffness of left hip, not elsewhere classified: Secondary | ICD-10-CM | POA: Diagnosis not present

## 2022-12-26 DIAGNOSIS — M47816 Spondylosis without myelopathy or radiculopathy, lumbar region: Secondary | ICD-10-CM | POA: Diagnosis not present

## 2022-12-26 NOTE — Progress Notes (Signed)
Chief Complaint:   OBESITY Megan Mitchell is here to discuss her progress with her obesity treatment plan along with follow-up of her obesity related diagnoses. Megan Mitchell is on keeping a food journal and adhering to recommended goals of 1550-1650 calories and 95+ grams of protein and the [place] and states she is following her eating plan approximately 85% of the time. Megan Mitchell states she is walking/pedal bike 20 minutes 3-7 times per week.  Today's visit was #: 11 Starting weight: 273 lbs Starting date: 02/17/2022 Today's weight: 271 lbs Today's date: 12/13/2022 Total lbs lost to date: 2 lbs Total lbs lost since last in-office visit: 0  Interim History: Megan Mitchell has followed plans 85% of time; alternating between pescatarian and Cat 3.  She is enjoying the Pescatarian plan alternating between chicken, [place], fish.  She doesn't feel like she tolerates beef as well. Able to get all food in comfortably.  May have had more salt at [facility].  Next few weeks she has plans to go to a rodeo in [place].  She is very much looking forward to this.  Has been getting in a good amount of chicken, beans and protein shakes to help ensure she is hitting her protein goals daily.   Subjective:   1. Essential hypertension Blood pressure slightly low today.  On Norvasc 10 mg, Losartan and Maxzide 25.  2. Other hyperlipidemia Cardiac CT snack of 11 or 87%.  On Lipitor.  Has appointment with cardiology tomorrow.  Assessment/Plan:   1. Essential hypertension Decrease Losartan to 20 mg daily for 1 month with 0 refills.  (Patient to cut pill in 1/2).  Continue other medications at current dose.  -Decrease/refill losartan (COZAAR) 50 MG tablet; Take 0.5 tablets (25 mg total) by mouth daily.  Dispense: 90 tablet; Refill: 3  2. Other hyperlipidemia Will follow up with plan after cardiology appointment.  3. BMI 40.0-44.9, adult (HCC)  4. Obesity with starting BMI of 41.3 Will Increase Wegovy to 1.7 mg  SubQ once weekly for 1 month with 0 refills.  -Increase/refill Semaglutide-Weight Management (WEGOVY) 1.7 MG/0.75ML SOAJ; Inject 1.7 mg into the skin once a week.  Dispense: 3 mL; Refill: 0  Megan Mitchell is currently in the action stage of change. As such, her goal is to continue with weight loss efforts. She has agreed to keeping a food journal and adhering to recommended goals of 1550-1650 calories and 95+ grams of protein and the Pescatarian Plan +300 daily.   Exercise goals: All adults should avoid inactivity. Some physical activity is better than none, and adults who participate in any amount of physical activity gain some health benefits.  Behavioral modification strategies: increasing lean protein intake, meal planning and cooking strategies, keeping healthy foods in the home, and planning for success.  Megan Mitchell has agreed to follow-up with our clinic in 4 weeks. She was informed of the importance of frequent follow-up visits to maximize her success with intensive lifestyle modifications for her multiple health conditions.   Objective:   Blood pressure 99/67, pulse 86, temperature 98.4 F (36.9 C), height '5\' 8"'$  (1.727 m), weight 271 lb (122.9 kg), last menstrual period 06/14/2020, SpO2 100 %. Body mass index is 41.21 kg/m.  General: Cooperative, alert, well developed, in no acute distress. HEENT: Conjunctivae and lids unremarkable. Cardiovascular: Regular rhythm.  Lungs: Normal work of breathing. Neurologic: No focal deficits.   Lab Results  Component Value Date   CREATININE 0.85 10/18/2022   BUN 10 10/18/2022   NA 136  10/18/2022   K 3.7 10/18/2022   CL 97 10/18/2022   CO2 22 10/18/2022   Lab Results  Component Value Date   ALT 17 10/18/2022   AST 21 10/18/2022   ALKPHOS 106 10/18/2022   BILITOT 0.6 10/18/2022   Lab Results  Component Value Date   HGBA1C 5.6 10/18/2022   HGBA1C 5.8 (H) 02/17/2022   HGBA1C 5.5 11/04/2020   HGBA1C 5.6 03/24/2020   HGBA1C 5.8 (H) 10/04/2019    Lab Results  Component Value Date   INSULIN 23.5 10/18/2022   INSULIN 28.8 (H) 02/17/2022   Lab Results  Component Value Date   TSH 1.580 02/03/2022   Lab Results  Component Value Date   CHOL 230 (H) 10/18/2022   HDL 67 10/18/2022   LDLCALC 146 (H) 10/18/2022   TRIG 96 10/18/2022   CHOLHDL 3.2 02/17/2022   Lab Results  Component Value Date   VD25OH 53.1 10/18/2022   VD25OH 30.7 02/17/2022   Lab Results  Component Value Date   WBC 7.1 10/18/2022   HGB 12.1 10/18/2022   HCT 39.1 10/18/2022   MCV 76 (L) 10/18/2022   PLT 509 (H) 10/18/2022   Lab Results  Component Value Date   IRON 51 10/18/2022   TIBC 467 (H) 10/18/2022   FERRITIN 48 10/18/2022   Attestation Statements:   Reviewed by clinician on day of visit: allergies, medications, problem list, medical history, surgical history, family history, social history, and previous encounter notes.  I, Elnora Morrison, RMA am acting as transcriptionist for Coralie Common, MD.  I have reviewed the above documentation for accuracy and completeness, and I agree with the above. - Coralie Common, MD

## 2023-01-03 ENCOUNTER — Other Ambulatory Visit (INDEPENDENT_AMBULATORY_CARE_PROVIDER_SITE_OTHER): Payer: Self-pay | Admitting: Family Medicine

## 2023-01-03 ENCOUNTER — Other Ambulatory Visit (HOSPITAL_COMMUNITY): Payer: Self-pay

## 2023-01-03 DIAGNOSIS — F411 Generalized anxiety disorder: Secondary | ICD-10-CM

## 2023-01-04 ENCOUNTER — Other Ambulatory Visit (HOSPITAL_COMMUNITY): Payer: Self-pay

## 2023-01-10 ENCOUNTER — Other Ambulatory Visit (HOSPITAL_COMMUNITY): Payer: Self-pay

## 2023-01-10 ENCOUNTER — Ambulatory Visit (INDEPENDENT_AMBULATORY_CARE_PROVIDER_SITE_OTHER): Payer: Commercial Managed Care - PPO | Admitting: Family Medicine

## 2023-01-10 ENCOUNTER — Encounter (INDEPENDENT_AMBULATORY_CARE_PROVIDER_SITE_OTHER): Payer: Self-pay | Admitting: Family Medicine

## 2023-01-10 VITALS — BP 123/81 | HR 82 | Temp 98.3°F | Ht 68.0 in | Wt 269.0 lb

## 2023-01-10 DIAGNOSIS — E669 Obesity, unspecified: Secondary | ICD-10-CM

## 2023-01-10 DIAGNOSIS — F411 Generalized anxiety disorder: Secondary | ICD-10-CM | POA: Diagnosis not present

## 2023-01-10 DIAGNOSIS — Z6841 Body Mass Index (BMI) 40.0 and over, adult: Secondary | ICD-10-CM

## 2023-01-10 DIAGNOSIS — I10 Essential (primary) hypertension: Secondary | ICD-10-CM | POA: Diagnosis not present

## 2023-01-10 MED ORDER — WEGOVY 1.7 MG/0.75ML ~~LOC~~ SOAJ: 1.7000 mg | SUBCUTANEOUS | 0 refills | Status: DC

## 2023-01-10 MED ORDER — BUSPIRONE HCL 10 MG PO TABS
10.0000 mg | ORAL_TABLET | Freq: Three times a day (TID) | ORAL | 0 refills | Status: DC
  Filled 2023-01-10: qty 90, 30d supply, fill #0

## 2023-01-10 NOTE — Progress Notes (Deleted)
Patient voices significant frustration with [facility] no longer covering weight loss medications.  She feels very anxious and stressed with work. She voices there is a significant amount of turnover at work. She is doing a combo of Dietitian.  She likes the freedom of the journaling and it makes her feel meal plan is not monotonous.  She has been eating quite a bit of vegetables, fruit, seafood.  She is trying to ensure her protein intake is adequate.  She has been walking more now that the weather is nicer.  Walking 2.5-3 miles 3-4 time a week.  She likes going to the [place] to swim but is going to hold off until the weather is nicer.

## 2023-01-11 ENCOUNTER — Telehealth (INDEPENDENT_AMBULATORY_CARE_PROVIDER_SITE_OTHER): Payer: Self-pay | Admitting: Family Medicine

## 2023-01-11 ENCOUNTER — Other Ambulatory Visit (HOSPITAL_COMMUNITY): Payer: Self-pay

## 2023-01-11 NOTE — Telephone Encounter (Signed)
PA started for Wegovy.

## 2023-01-11 NOTE — Telephone Encounter (Signed)
3/13 patient is calling about Wegovy mg? Patient stated she thought that Dr. Jeani Sow was going toincrease doses amount and what to get confirmation. Megan Mitchell

## 2023-01-11 NOTE — Telephone Encounter (Signed)
Pt calling pharmacy and will send message ? increase dose Re: Dr Jeani Sow was not sure if pt would be able to get med if went up a dose

## 2023-01-12 ENCOUNTER — Telehealth (INDEPENDENT_AMBULATORY_CARE_PROVIDER_SITE_OTHER): Payer: Self-pay | Admitting: Family Medicine

## 2023-01-16 ENCOUNTER — Encounter: Payer: Self-pay | Admitting: Internal Medicine

## 2023-01-16 ENCOUNTER — Telehealth (INDEPENDENT_AMBULATORY_CARE_PROVIDER_SITE_OTHER): Payer: Self-pay | Admitting: Family Medicine

## 2023-01-16 ENCOUNTER — Ambulatory Visit: Payer: Commercial Managed Care - PPO | Admitting: Internal Medicine

## 2023-01-16 ENCOUNTER — Other Ambulatory Visit (HOSPITAL_COMMUNITY): Payer: Self-pay

## 2023-01-16 VITALS — BP 117/75 | HR 68 | Temp 97.5°F | Ht 68.0 in | Wt 272.6 lb

## 2023-01-16 DIAGNOSIS — H66002 Acute suppurative otitis media without spontaneous rupture of ear drum, left ear: Secondary | ICD-10-CM

## 2023-01-16 DIAGNOSIS — F419 Anxiety disorder, unspecified: Secondary | ICD-10-CM

## 2023-01-16 DIAGNOSIS — H9312 Tinnitus, left ear: Secondary | ICD-10-CM

## 2023-01-16 DIAGNOSIS — M25569 Pain in unspecified knee: Secondary | ICD-10-CM

## 2023-01-16 DIAGNOSIS — M25561 Pain in right knee: Secondary | ICD-10-CM | POA: Diagnosis not present

## 2023-01-16 MED ORDER — METHYLPREDNISOLONE ACETATE 80 MG/ML IJ SUSP
80.0000 mg | Freq: Once | INTRAMUSCULAR | Status: AC
  Administered 2023-01-16: 80 mg via INTRAMUSCULAR

## 2023-01-16 MED ORDER — KETOROLAC TROMETHAMINE 60 MG/2ML IM SOLN
60.0000 mg | Freq: Once | INTRAMUSCULAR | Status: AC
  Administered 2023-01-16: 60 mg via INTRAMUSCULAR

## 2023-01-16 MED ORDER — SIMPLY SALINE 0.9 % NA AERS
2.0000 | INHALATION_SPRAY | Freq: Every evening | NASAL | 11 refills | Status: DC
  Filled 2023-01-16: qty 126, 30d supply, fill #0

## 2023-01-16 MED ORDER — AMOXICILLIN-POT CLAVULANATE 875-125 MG PO TABS
1.0000 | ORAL_TABLET | Freq: Two times a day (BID) | ORAL | 0 refills | Status: DC
  Filled 2023-01-16: qty 20, 10d supply, fill #0

## 2023-01-16 MED ORDER — KETOROLAC TROMETHAMINE 10 MG PO TABS
10.0000 mg | ORAL_TABLET | Freq: Four times a day (QID) | ORAL | 0 refills | Status: DC | PRN
  Filled 2023-01-16: qty 20, 5d supply, fill #0

## 2023-01-16 MED ORDER — LIDOCAINE HCL 2 % IJ SOLN
20.0000 mL | Freq: Once | INTRAMUSCULAR | Status: AC
  Administered 2023-01-16: 400 mg

## 2023-01-16 NOTE — Progress Notes (Signed)
[facility]: [phone]   Routine Medical Office Visit  Patient:  Megan Mitchell      Age: 52 y.o.       Sex:  female  Date:   01/16/2023  PCP:    Inda Coke, [place] Provider: Loralee Pacas, MD   Assessment and Plan:    Tiara was seen today for tinnitus and knee pain.  Acute knee pain, unspecified laterality -     Ketorolac Tromethamine; Take 1 tablet (10 mg total) by mouth every 6 (six) hours as needed.  Dispense: 20 tablet; Refill: 0 -     Ketorolac Tromethamine -     methylPREDNISolone Acetate -     Lidocaine HCl  Non-recurrent acute suppurative otitis media of left ear without spontaneous rupture of tympanic membrane -     Amoxicillin-Pot Clavulanate; Take 1 tablet by mouth 2 (two) times daily.  Dispense: 20 tablet; Refill: 0 -     Simply Saline; Place 2 sprays into the nose at bedtime. Spray each nostril nightly to rinse sinuses.  Additional unlimited sprays as needed nasal congestion.  Dispense: 126 mL; Refill: 11  Anxiety disorder, unspecified type Overview: Patient reports inadequate response to intermittent buspirone  Assessment & Plan: Advised patient to give buspirone a chance with regular daily use She was curious if anything else might help Advised patient to take it daily for a few weeks then follow up with Primary Care Provider (PCP) for adjustment further.   Acute pain of right knee Overview: History meniscal injury, flared with overuse  Assessment & Plan:  Knee exam consistent with acute flare osteoarthritis Mentioned surgery, gave handout Gave Toradol injection(s) and pills advised patient to return to clinic if this does not control She responded "so I have to come back another time if I want the injection", so I agreed to do the knee injection(s) after informed consent Completed and scanned informed consent    Tinnitus, left ear    PROCEDURE: INTRA-ARTICULAR STEROID KNEE INJECTION   Verbal  and written consent obtained and verified for right Knee injection Sterile betadine prep.  Topical analgesic spray: Ethyl chloride. Joint: right knee Approached in typical fashion with: anterolateral approach Completed without difficulty, no complication Meds: 4 cc lidocaine without epinephrine and 1 cc of depo medrol 80 mg/cc Needle:1 1/2 inch 25 gauge needle Aftercare instructions and Red flags advised.     Clinical Presentation:   The patient is a 52 y.o. female Tinnitus (Left ear for three or four days. Intermittent.) and Knee Pain (Torn meniscus and arthritis in R knee, feels flared due to exertion, feels like a catch, using ice and muscle rubs.  Hasn't tried ibuprofen.  )   52 y.o. female Tinnitus (Left ear for three or four days. Intermittent.) and Knee Pain (Torn meniscus and arthritis in R knee, feels flared due to exertion, feels like a catch, using ice and muscle rubs.  Hasn't tried ibuprofen.  )    has HTN (hypertension); Severe obesity (BMI >= 40) ([place]); Anemia; Pelvic mass in female; Fibroids; Urinary tract infection; Pain in left knee; Upper respiratory tract infection; Pain in right knee; Osteoarthritis of knees, bilateral; Hyperlipidemia; Elevated coronary artery calcium score; and Anxiety disorder on their problem list.  has a past medical history of Allergy, Anemia, Anxiety, Arthritis, Fibroids, Hypertension, and Ovarian cyst.  Current Outpatient Medications:    amLODipine (NORVASC) 10 MG tablet, TAKE 1 TABLET BY MOUTH DAILY., Disp: 90 tablet, Rfl: 0  amoxicillin-clavulanate (AUGMENTIN) 875-125 MG tablet, Take 1 tablet by mouth 2 (two) times daily., Disp: 20 tablet, Rfl: 0   atorvastatin (LIPITOR) 20 MG tablet, Take 1 tablet (20 mg total) by mouth daily., Disp: 90 tablet, Rfl: 1   Biotin w/ Vitamins C & E (HAIR/SKIN/NAILS PO), Take by mouth., Disp: , Rfl:    busPIRone (BUSPAR) 10 MG tablet, Take 1 tablet (10 mg total) by mouth 3 (three) times daily., Disp: 90 tablet,  Rfl: 0   fluticasone (FLONASE) 50 MCG/ACT nasal spray, Place 2 sprays into both nostrils daily., Disp: 16 g, Rfl: 6   ketorolac (TORADOL) 10 MG tablet, Take 1 tablet (10 mg total) by mouth every 6 (six) hours as needed., Disp: 20 tablet, Rfl: 0   losartan (COZAAR) 50 MG tablet, Take 0.5 tablets (25 mg total) by mouth daily., Disp: 90 tablet, Rfl: 3   Multiple Vitamin (MULTIVITAMIN ADULT PO), Take by mouth., Disp: , Rfl:    Probiotic Product (FORTIFY PROBIOTIC WOMENS PO), Take by mouth., Disp: , Rfl:    Saline (SIMPLY SALINE) 0.9 % AERS, Place 2 sprays into the nose at bedtime. Spray each nostril nightly to rinse sinuses.  Additional unlimited sprays as needed nasal congestion., Disp: 126 mL, Rfl: 11   Semaglutide-Weight Management (WEGOVY) 1.7 MG/0.75ML SOAJ, Inject 1.7 mg into the skin once a week., Disp: 9 mL, Rfl: 0   triamterene-hydrochlorothiazide (MAXZIDE-25) 37.5-25 MG tablet, Take 1 tablet by mouth daily., Disp: 90 tablet, Rfl: 0  Active Ambulatory Problems    Diagnosis Date Noted   HTN (hypertension) 03/22/2014   Severe obesity (BMI >= 40) (HCC) 03/22/2014   Anemia 12/26/2014   Pelvic mass in female 04/08/2021   Fibroids 04/08/2021   Urinary tract infection 05/11/2021   Pain in left knee 04/20/2022   Upper respiratory tract infection 07/27/2022   Pain in right knee 11/08/2022   Osteoarthritis of knees, bilateral 11/08/2022   Hyperlipidemia 12/14/2022   Elevated coronary artery calcium score 12/14/2022   Anxiety disorder 01/16/2023   Resolved Ambulatory Problems    Diagnosis Date Noted   No Resolved Ambulatory Problems   Past Medical History:  Diagnosis Date   Allergy    Anxiety    Arthritis    Hypertension    Ovarian cyst     Outpatient Medications Prior to Visit  Medication Sig   amLODipine (NORVASC) 10 MG tablet TAKE 1 TABLET BY MOUTH DAILY.   atorvastatin (LIPITOR) 20 MG tablet Take 1 tablet (20 mg total) by mouth daily.   Biotin w/ Vitamins C & E  (HAIR/SKIN/NAILS PO) Take by mouth.   busPIRone (BUSPAR) 10 MG tablet Take 1 tablet (10 mg total) by mouth 3 (three) times daily.   fluticasone (FLONASE) 50 MCG/ACT nasal spray Place 2 sprays into both nostrils daily.   losartan (COZAAR) 50 MG tablet Take 0.5 tablets (25 mg total) by mouth daily.   Multiple Vitamin (MULTIVITAMIN ADULT PO) Take by mouth.   Probiotic Product (FORTIFY PROBIOTIC WOMENS PO) Take by mouth.   Semaglutide-Weight Management (WEGOVY) 1.7 MG/0.75ML SOAJ Inject 1.7 mg into the skin once a week.   triamterene-hydrochlorothiazide (MAXZIDE-25) 37.5-25 MG tablet Take 1 tablet by mouth daily.   [DISCONTINUED] Cholecalciferol (VITAMIN D3) 125 MCG (5000 UT) CAPS Take 1 capsule (5,000 Units total) by mouth daily.   No facility-administered medications prior to visit.     HPI  Patient's complaining of knee pain right flare after use, no systemic signs and symptoms such as fever, myalgias, malaise, fatigue, chills  Also 3-4 days tinnitus left ear. Recent sinus infection took abx          Clinical Data Analysis:  Physical Exam  BP 117/75 (BP Location: Right Arm, Patient Position: Sitting)   Pulse 68   Temp (!) 97.5 F (36.4 C) (Temporal)   Ht 5\' 8"  (1.727 m)   Wt 272 lb 9.6 oz (123.7 kg)   LMP 06/14/2020 (Approximate)   SpO2 99%   BMI 41.45 kg/m  Wt Readings from Last 10 Encounters:  01/16/23 272 lb 9.6 oz (123.7 kg)  01/10/23 269 lb (122 kg)  12/14/22 276 lb 9.6 oz (125.5 kg)  12/13/22 271 lb (122.9 kg)  12/02/22 274 lb (124.3 kg)  11/24/22 277 lb 3.2 oz (125.7 kg)  11/15/22 270 lb (122.5 kg)  11/07/22 277 lb (125.6 kg)  10/18/22 277 lb (125.6 kg)  09/27/22 277 lb (125.6 kg)   Vital signs reviewed.  Nursing notes reviewed. Weight trend reviewed. Abnormalities and Problem-Specific physical exam findings: tender right knee especially lateral prepatellar area. Purulent meniscus behind left tympanic membrane  General Appearance:  Well developed, well nourished  .vitalheight  female with Body mass index is 41.45 kg/m. in no acute distress.   Pulmonary:  Normal work of breathing at rest, no respiratory distress apparent. SpO2: 99 %  Musculoskeletal: All extremities are intact.  Neurological:  Awake, alert. No obvious focal neurological deficits or cognitive impairments.  Sensorium seems unclouded. Psychiatric:  Appropriate mood, pleasant demeanor    No image results found.        Signed: Loralee Pacas, MD 01/16/2023 7:35 PM

## 2023-01-16 NOTE — Patient Instructions (Signed)
ou had an injection today.  Things to be aware of after injection are listed below: You may experience no significant improvement or even a slight worsening in your symptoms during the first 24 to 48 hours.  After that we expect your symptoms to improve gradually over the next 2 weeks for the medicine to have its maximal effect.  You should continue to have improvement out to 6 weeks after your injection. Dr. Randol Kern  recommends icing the site of the injection for 20 minutes at least 2 times the day of your injection You may shower but no swimming, tub bath or Jacuzzi for 24 hours. If your bandage falls off this does not need to be replaced.  It is appropriate to remove the bandage after 4 hours. You may resume light activities as tolerated unless otherwise directed per Dr. Paulla Fore during your visit  POSSIBLE STEROID SIDE EFFECTS:  Side effects from injectable steroids tend to be less than when taken orally however you may experience some of the symptoms listed below.  If experienced these should only last for a short period of time. Change in menstrual flow  Edema (swelling)  Increased appetite Skin flushing (redness)  Skin rash/acne  Thrush (oral) Yeast vaginitis    Increased sweating  Depression Increased blood glucose levels Cramping and leg/calf  Euphoria (feeling happy)  POSSIBLE PROCEDURE SIDE EFFECTS: The side effects of the injection are usually fairly minimal however if you may experience some of the following side effects that are usually self-limited and will is off on their own.  If you are concerned please feel free to call the office with questions:  Increased numbness or tingling  Nausea or vomiting  Swelling or bruising at the injection site   Please call our office if if you experience any of the following symptoms over the next week as these can be signs of infection:   Fever greater than 100.50F  Significant swelling at the injection site  Significant redness or drainage  from the injection site

## 2023-01-16 NOTE — Telephone Encounter (Signed)
Megan Mitchell, I just heard back from your insurance about the prior authorization for [facility]. Unfortunately, it was denied as below:   This request has not been approved. We were asked to perform a prior authorization for coverage of the drug or product listed at the top of this letter under your pharmacy benefit. We denied this request because your plan does not allow certain drugs or classes of drugs to be covered under your pharmacy benefit. You may contact your plan for a list of excluded drugs or products. We denied this request based on the General Exclusion section of the [place] Formulary. Your pharmacy benefit coverage for [name] has recently changed. [name] is excluded from coverage during the time of this request. Covered alternatives may include phentermine. Additional alternatives may include Contrave, Qsymia, and Orlistat, all of which require prior authorization. Please work with your provider to discuss other treatment options. A written notification letter will follow with additional details.  Other options, can be discussed at your next visit. I hope you have a good day, Megan Mitchell, CMA

## 2023-01-16 NOTE — Assessment & Plan Note (Signed)
Advised patient to give buspirone a chance with regular daily use She was curious if anything else might help Advised patient to take it daily for a few weeks then follow up with Primary Care Provider (PCP) for adjustment further.

## 2023-01-16 NOTE — Assessment & Plan Note (Signed)
  Knee exam consistent with acute flare osteoarthritis Mentioned surgery, gave handout Gave Toradol injection(s) and pills advised patient to return to clinic if this does not control She responded "so I have to come back another time if I want the injection", so I agreed to do the knee injection(s) after informed consent Completed and scanned informed consent

## 2023-01-17 ENCOUNTER — Other Ambulatory Visit: Payer: Self-pay

## 2023-01-17 ENCOUNTER — Other Ambulatory Visit: Payer: Self-pay | Admitting: Physician Assistant

## 2023-01-17 ENCOUNTER — Other Ambulatory Visit (INDEPENDENT_AMBULATORY_CARE_PROVIDER_SITE_OTHER): Payer: Self-pay | Admitting: Physician Assistant

## 2023-01-17 ENCOUNTER — Other Ambulatory Visit (HOSPITAL_COMMUNITY): Payer: Self-pay

## 2023-01-17 DIAGNOSIS — I10 Essential (primary) hypertension: Secondary | ICD-10-CM

## 2023-01-17 MED ORDER — AMLODIPINE BESYLATE 10 MG PO TABS
10.0000 mg | ORAL_TABLET | Freq: Every day | ORAL | 0 refills | Status: DC
  Filled 2023-01-17: qty 90, fill #0

## 2023-01-17 MED ORDER — TRIAMTERENE-HCTZ 37.5-25 MG PO TABS
1.0000 | ORAL_TABLET | Freq: Every day | ORAL | 0 refills | Status: DC
  Filled 2023-01-17: qty 90, 90d supply, fill #0

## 2023-01-18 NOTE — Progress Notes (Signed)
Chief Complaint:   OBESITY Megan Mitchell is here to discuss her progress with her obesity treatment plan along with follow-up of her obesity related diagnoses. Megan Mitchell is on keeping a food journal and adhering to recommended goals of 1550-1650 calories and 95+ protein and the [place] and states she is following her eating plan approximately 86% of the time. Megan Mitchell states she is walking 2-1/2 miles 4 times per week.  Today's visit was #: 12 Starting weight: 273 lbs Starting date: 02/17/2022 Today's weight: 269 lbs Today's date: 01/10/2023 Total lbs lost to date: 4 lbs Total lbs lost since last in-office visit: 2 lbs  Interim History: Patient voices significant frustration with [facility] no longer covering weight loss medications. She feels very anxious and stressed with work. She voices there is a significant amount of turnover at work. She is doing a combo of Dietitian. She likes the freedom of the journaling and it makes her feel meal plan is not monotonous. She has been eating quite a bit of vegetables, fruit, seafood. She is trying to ensure her protein intake is adequate. She has been walking more now that the weather is nicer. Walking 2.5-3 miles 3-4 time a week. She likes going to the [place] to swim but is going to hold off until the weather is nicer.   Subjective:   1. Essential hypertension Blood pressure controlled today.  No chest pain, chest pressure, or headaches.   2. GAD (generalized anxiety disorder) Patient is noticing significant increase in anxiety with turnover at work. 5 mg barely taking the edge off.  Patient states that she is going to start going to therapy.    Assessment/Plan:   1. Essential hypertension Continue Cozaar, no change in medications.   2. GAD (generalized anxiety disorder) Refill/increase- busPIRone (BUSPAR) 10 MG tablet; Take 1 tablet (10 mg total) by mouth 3 (three) times daily.  Dispense: 90 tablet; Refill: 0  3. BMI  40.0-44.9, adult (HCC) Refill- Semaglutide-Weight Management (WEGOVY) 1.7 MG/0.75ML SOAJ; Inject 1.7 mg into the skin once a week.  Dispense: 9 mL; Refill: 0  4. Obesity with starting BMI of 41.3 Refill- Semaglutide-Weight Management (WEGOVY) 1.7 MG/0.75ML SOAJ; Inject 1.7 mg into the skin once a week.  Dispense: 9 mL; Refill: 0  Megan Mitchell is currently in the action stage of change. As such, her goal is to continue with weight loss efforts. She has agreed to keeping a food journal and adhering to recommended goals of 1550-1650 calories and 95+ protein and the [place].   Exercise goals: All adults should avoid inactivity. Some physical activity is better than none, and adults who participate in any amount of physical activity gain some health benefits.  Behavioral modification strategies: increasing lean protein intake, meal planning and cooking strategies, keeping healthy foods in the home, and planning for success.  Megan Mitchell has agreed to follow-up with our clinic in 4 weeks. She was informed of the importance of frequent follow-up visits to maximize her success with intensive lifestyle modifications for her multiple health conditions.   Objective:   Blood pressure 123/81, pulse 82, temperature 98.3 F (36.8 C), height 5\' 8"  (1.727 m), weight 269 lb (122 kg), last menstrual period 06/14/2020, SpO2 96 %. Body mass index is 40.9 kg/m.  General: Cooperative, alert, well developed, in no acute distress. HEENT: Conjunctivae and lids unremarkable. Cardiovascular: Regular rhythm.  Lungs: Normal work of breathing. Neurologic: No focal deficits.   Lab Results  Component Value Date   CREATININE  0.85 10/18/2022   BUN 10 10/18/2022   NA 136 10/18/2022   K 3.7 10/18/2022   CL 97 10/18/2022   CO2 22 10/18/2022   Lab Results  Component Value Date   ALT 17 10/18/2022   AST 21 10/18/2022   ALKPHOS 106 10/18/2022   BILITOT 0.6 10/18/2022   Lab Results  Component Value Date   HGBA1C 5.6  10/18/2022   HGBA1C 5.8 (H) 02/17/2022   HGBA1C 5.5 11/04/2020   HGBA1C 5.6 03/24/2020   HGBA1C 5.8 (H) 10/04/2019   Lab Results  Component Value Date   INSULIN 23.5 10/18/2022   INSULIN 28.8 (H) 02/17/2022   Lab Results  Component Value Date   TSH 1.580 02/03/2022   Lab Results  Component Value Date   CHOL 230 (H) 10/18/2022   HDL 67 10/18/2022   LDLCALC 146 (H) 10/18/2022   TRIG 96 10/18/2022   CHOLHDL 3.2 02/17/2022   Lab Results  Component Value Date   VD25OH 53.1 10/18/2022   VD25OH 30.7 02/17/2022   Lab Results  Component Value Date   WBC 7.1 10/18/2022   HGB 12.1 10/18/2022   HCT 39.1 10/18/2022   MCV 76 (L) 10/18/2022   PLT 509 (H) 10/18/2022   Lab Results  Component Value Date   IRON 51 10/18/2022   TIBC 467 (H) 10/18/2022   FERRITIN 48 10/18/2022   Attestation Statements:   Reviewed by clinician on day of visit: allergies, medications, problem list, medical history, surgical history, family history, social history, and previous encounter notes.  I, Davy Pique, RMA, am acting as transcriptionist for Coralie Common, MD.  I have reviewed the above documentation for accuracy and completeness, and I agree with the above. - Coralie Common, MD

## 2023-01-23 ENCOUNTER — Other Ambulatory Visit (HOSPITAL_COMMUNITY): Payer: Self-pay

## 2023-01-23 ENCOUNTER — Telehealth (INDEPENDENT_AMBULATORY_CARE_PROVIDER_SITE_OTHER): Payer: Self-pay | Admitting: Family Medicine

## 2023-01-23 ENCOUNTER — Other Ambulatory Visit (INDEPENDENT_AMBULATORY_CARE_PROVIDER_SITE_OTHER): Payer: Self-pay | Admitting: Physician Assistant

## 2023-01-23 ENCOUNTER — Telehealth: Payer: Self-pay | Admitting: Physician Assistant

## 2023-01-23 ENCOUNTER — Encounter (INDEPENDENT_AMBULATORY_CARE_PROVIDER_SITE_OTHER): Payer: Self-pay

## 2023-01-23 DIAGNOSIS — I10 Essential (primary) hypertension: Secondary | ICD-10-CM

## 2023-01-23 MED ORDER — LOSARTAN POTASSIUM 50 MG PO TABS
25.0000 mg | ORAL_TABLET | Freq: Every day | ORAL | 0 refills | Status: DC
  Filled 2023-01-23: qty 45, 90d supply, fill #0
  Filled 2023-02-28 – 2023-03-04 (×2): qty 45, 90d supply, fill #1

## 2023-01-23 NOTE — Telephone Encounter (Signed)
L/mess om 3:51 01/23/23-CS

## 2023-01-23 NOTE — Telephone Encounter (Signed)
Patient is calling to increase of dosage of Wegovy to 2.4mg . She asked could you please send to pharmacy. She does not wanted the 1.7MG . Call patient to confirm

## 2023-01-23 NOTE — Telephone Encounter (Signed)
  LAST APPOINTMENT DATE: 11/07/22  NEXT APPOINTMENT DATE:  MEDICATION:  losartan (COZAAR) 50 MG tablet  Is the patient out of medication? Yes-for 2 days  PHARMACY:  [place] Phone: [phone]  Fax: [phone]

## 2023-01-23 NOTE — Telephone Encounter (Signed)
Spoke to pt told her Rx was sent to pharmacy as requested. Also you are due for a physical sometime in April. Pt verbalized understanding and will call back to schedule an appt.

## 2023-01-24 DIAGNOSIS — F411 Generalized anxiety disorder: Secondary | ICD-10-CM | POA: Diagnosis not present

## 2023-01-26 ENCOUNTER — Other Ambulatory Visit: Payer: Self-pay | Admitting: Physician Assistant

## 2023-01-26 ENCOUNTER — Ambulatory Visit
Admission: RE | Admit: 2023-01-26 | Discharge: 2023-01-26 | Disposition: A | Discharge status: Home / Self Care | Payer: Commercial Managed Care - PPO | Source: Ambulatory Visit | Attending: Nurse Practitioner | Admitting: Nurse Practitioner

## 2023-01-26 DIAGNOSIS — Z1231 Encounter for screening mammogram for malignant neoplasm of breast: Secondary | ICD-10-CM

## 2023-01-30 ENCOUNTER — Other Ambulatory Visit (HOSPITAL_COMMUNITY): Payer: Self-pay

## 2023-02-01 DIAGNOSIS — Z01419 Encounter for gynecological examination (general) (routine) without abnormal findings: Secondary | ICD-10-CM | POA: Diagnosis not present

## 2023-02-01 DIAGNOSIS — Z6841 Body Mass Index (BMI) 40.0 and over, adult: Secondary | ICD-10-CM | POA: Diagnosis not present

## 2023-02-01 DIAGNOSIS — N951 Menopausal and female climacteric states: Secondary | ICD-10-CM | POA: Diagnosis not present

## 2023-02-01 DIAGNOSIS — Z1211 Encounter for screening for malignant neoplasm of colon: Secondary | ICD-10-CM | POA: Diagnosis not present

## 2023-02-01 DIAGNOSIS — Z90711 Acquired absence of uterus with remaining cervical stump: Secondary | ICD-10-CM | POA: Diagnosis not present

## 2023-02-01 DIAGNOSIS — Z1239 Encounter for other screening for malignant neoplasm of breast: Secondary | ICD-10-CM | POA: Diagnosis not present

## 2023-02-01 NOTE — Progress Notes (Addendum)
Subjective:    Megan Mitchell is a 52 y.o. female and is here for a comprehensive physical exam.  HPI  Health Maintenance Due  Topic Date Due   Hepatitis C Screening  Never done    Acute Concerns: None  Chronic Issues: Anxiety Treated with buspirone 10 mg three times daily. Has not been feeling adequate relief with this. Denies SI/HI  Hypertension Treated with Norvasc 10 mg daily, Cozaar 25 mg daily, Maxzide-25 37.5-25 mg daily. Blood pressure normal today at 130/80.  Hyperlipidemia Treated with Lipitor 20 mg daily. CHOL elevated at 230, LDL at 146 on 10/18/22, lipid panel otherwise normal.  BMI at 41.85 Taking Wegovy 1.7 mg weekly. Follows her diet.  Menopausal Taking Veozah 45 mg daily in the afternoon. This is helping her symptoms without negative side effects.  Denies unusual headaches, hemiparesis, diarrhea, constipation, heartburn, rectal bleeding.  Health Maintenance: Immunizations -- UTD on flu, tetanus, Covid-19, shingles vaccines. Colonoscopy -- Last completed 02/26/21. Results showed one 5 mm polyp in transverse colon, removed, one 15 mm polyp at recto-sigmoid colon, removed, one 7 mm polyp in rectum, removed, non-bleeding external and internal hemorrhoids. Pathology showed one tubular adenoma and three hyperplastic polyps. Recommended repeat in 2025. Mammogram -- Mammogram last completed 01/30/23. No mammographic evidence of malignancy. Recommended repeat in 2025. PAP -- Status post total hysterectomy with BSO. Bone Density -- N/A Diet -- Mostly healthy. Exercise -- None.  Sleep habits -- Stable Mood -- Stable  UTD with dentist? - UTD UTD with eye doctor? - UTD  Weight history: Wt Readings from Last 10 Encounters:  02/08/23 275 lb 4 oz (124.9 kg)  01/16/23 272 lb 9.6 oz (123.7 kg)  01/10/23 269 lb (122 kg)  12/14/22 276 lb 9.6 oz (125.5 kg)  12/13/22 271 lb (122.9 kg)  12/02/22 274 lb (124.3 kg)  11/24/22 277 lb 3.2 oz (125.7 kg)  11/15/22  270 lb (122.5 kg)  11/07/22 277 lb (125.6 kg)  10/18/22 277 lb (125.6 kg)   Body mass index is 41.85 kg/m. Patient's last menstrual period was 06/14/2020 (approximate).  Alcohol use:  reports no history of alcohol use.  Tobacco use:  Tobacco Use: Low Risk  (02/08/2023)   Patient History    Smoking Tobacco Use: Never    Smokeless Tobacco Use: Never    Passive Exposure: Not on file   Eligible for lung cancer screening? No     02/08/2023    2:51 PM  Depression screen PHQ 2/9  Decreased Interest 1  Down, Depressed, Hopeless 0  PHQ - 2 Score 1  Altered sleeping 1  Tired, decreased energy 0  Change in appetite 0  Feeling bad or failure about yourself  0  Trouble concentrating 0  Moving slowly or fidgety/restless 0  Suicidal thoughts 0  PHQ-9 Score 2  Difficult doing work/chores Not difficult at all     Other providers/specialists: Patient Care Team: Jarold Motto, [place] as PCP - General (Physician Assistant)    PMHx, SurgHx, SocialHx, Medications, and Allergies were reviewed in the Visit Navigator and updated as appropriate.   Past Medical History:  Diagnosis Date   Allergy    seasonal   Anemia    Anxiety    Arthritis    left knee   Fibroids    Hypertension    Ovarian cyst      Past Surgical History:  Procedure Laterality Date   CHOLECYSTECTOMY     COLONOSCOPY     ELBOW SURGERY  ROBOTIC ASSISTED TOTAL HYSTERECTOMY WITH BILATERAL SALPINGO OOPHERECTOMY Bilateral 05/11/2021   Procedure: XI ROBOTIC ASSISTED TOTAL HYSTERECTOMY GREATER THAN 250 GRAMS WITH BILATERAL SALPINGO OOPHORECTOMY;MINI LAPAROTOMY FOR CYST DECOMPRESSION;OMENTECTOMY;  Surgeon: Adolphus Birchwood, MD;  Location: [facility];  Service: Gynecology;  Laterality: Bilateral;   TUBAL LIGATION       Family History  Problem Relation Age of Onset   Hypertension Mother    Hypertension Maternal Grandmother    Heart attack Maternal Grandfather    Hypertension Maternal Grandfather    Colon polyps Neg Hx     Esophageal cancer Neg Hx    Rectal cancer Neg Hx    Stomach cancer Neg Hx    Breast cancer Neg Hx    Colon cancer Neg Hx     Social History   Tobacco Use   Smoking status: Never   Smokeless tobacco: Never  Vaping Use   Vaping Use: Never used  Substance Use Topics   Alcohol use: No   Drug use: No    Review of Systems:   Review of Systems  Constitutional:  Negative for chills, fever, malaise/fatigue and weight loss.  HENT:  Negative for hearing loss, sinus pain and sore throat.   Respiratory:  Negative for cough, hemoptysis and shortness of breath.   Cardiovascular:  Negative for chest pain, palpitations, leg swelling and PND.  Gastrointestinal:  Negative for abdominal pain, constipation, diarrhea, heartburn, nausea and vomiting.  Genitourinary:  Negative for dysuria, frequency and urgency.  Musculoskeletal:  Negative for back pain, myalgias and neck pain.  Skin:  Negative for itching and rash.  Neurological:  Negative for dizziness, tingling, seizures and headaches.       (-) Hemiparesis  Endo/Heme/Allergies:  Negative for polydipsia.  Psychiatric/Behavioral:  Negative for depression. The patient is not nervous/anxious.     Objective:   BP 130/80 (BP Location: Left Arm, Patient Position: Sitting, Cuff Size: Large)   Pulse 61   Temp (!) 97.1 F (36.2 C) (Temporal)   Ht  (1.727 m)   Wt 275 lb 4 oz (124.9 kg)   LMP 06/14/2020 (Approximate)   SpO2 100%   BMI 41.85 kg/m  Body mass index is 41.85 kg/m.   General Appearance:    Alert, cooperative, no distress, appears stated age  Head:    Normocephalic, without obvious abnormality, atraumatic  Eyes:    PERRL, conjunctiva/corneas clear, EOM's intact, fundi    benign, both eyes  Ears:    Normal TM's and external ear canals, both ears  Nose:   Nares normal, septum midline, mucosa normal, no drainage    or sinus tenderness  Throat:   Lips, mucosa, and tongue normal; teeth and gums normal  Neck:   Supple,  symmetrical, trachea midline, no adenopathy;    thyroid:  no enlargement/tenderness/nodules; no carotid   bruit or JVD  Back:     Symmetric, no curvature, ROM normal, no CVA tenderness  Lungs:     Clear to auscultation bilaterally, respirations unlabored  Chest Wall:    No tenderness or deformity   Heart:    Regular rate and rhythm, S1 and S2 normal, no murmur, rub or gallop  Breast Exam:    Deferred   Abdomen:     Soft, non-tender, bowel sounds active all four quadrants,    no masses, no organomegaly  Genitalia:    Deferred   Extremities:   Extremities normal, atraumatic, no cyanosis or edema  Pulses:   2+ and symmetric all extremities  Skin:  Skin color, texture, turgor normal, no rashes or lesions  Lymph nodes:   Cervical, supraclavicular, and axillary nodes normal  Neurologic:   CNII-XII intact, normal strength, sensation and reflexes    throughout    Assessment/Plan:   Routine physical examination Today patient counseled on age appropriate routine health concerns for screening and prevention, each reviewed and up to date or declined. Immunizations reviewed and up to date or declined. Labs declined as they were just completed at [facility]. Risk factors for depression reviewed and negative. Hearing function and visual acuity are intact. ADLs screened and addressed as needed. Functional ability and level of safety reviewed and appropriate. Education, counseling and referrals performed based on assessed risks today. Patient provided with a copy of personalized plan for preventive services.  Essential hypertension Normotensive  Continue Norvasc 10 mg daily, Cozaar 25 mg daily, Maxzide-25 37.5-25 mg daily. Follow-up in 6 months  GAD (generalized anxiety disorder) Uncontrolled Stop buspar 10 mg Start zoloft 25 mg daily  Side effects/risks/benefits discussed Follow-up in 1 month, sooner if concerns I discussed with patient that if they develop any SI, to tell someone immediately and seek  medical attention.  BMI 40.0-44.9, adult Management per [facility]  Pure hypercholesterolemia Well controlled Continue lipitor 20 mg daily  Vasomotor symptoms due to menopause Improving Mgmt per gyn  I,Alexander Ruley,acting as a scribe for [employer], PA.,have documented all relevant documentation on the behalf of Jarold Motto, PA,as directed by  Jarold Motto, PA while in the presence of Jarold Motto, [place].  I, Jarold Motto, [place], have reviewed all documentation for this visit. The documentation on 02/08/23 for the exam, diagnosis, procedures, and orders are all accurate and complete.   Jarold Motto, PA-C [facility]

## 2023-02-02 ENCOUNTER — Other Ambulatory Visit (INDEPENDENT_AMBULATORY_CARE_PROVIDER_SITE_OTHER): Payer: Self-pay

## 2023-02-02 DIAGNOSIS — Z6841 Body Mass Index (BMI) 40.0 and over, adult: Secondary | ICD-10-CM

## 2023-02-06 DIAGNOSIS — N951 Menopausal and female climacteric states: Secondary | ICD-10-CM | POA: Diagnosis not present

## 2023-02-07 DIAGNOSIS — F411 Generalized anxiety disorder: Secondary | ICD-10-CM | POA: Diagnosis not present

## 2023-02-08 ENCOUNTER — Other Ambulatory Visit (HOSPITAL_COMMUNITY): Payer: Self-pay

## 2023-02-08 ENCOUNTER — Ambulatory Visit (INDEPENDENT_AMBULATORY_CARE_PROVIDER_SITE_OTHER): Payer: Commercial Managed Care - PPO | Admitting: Family Medicine

## 2023-02-08 ENCOUNTER — Ambulatory Visit (INDEPENDENT_AMBULATORY_CARE_PROVIDER_SITE_OTHER): Payer: Commercial Managed Care - PPO | Admitting: Physician Assistant

## 2023-02-08 ENCOUNTER — Encounter: Payer: Self-pay | Admitting: Physician Assistant

## 2023-02-08 VITALS — BP 130/80 | HR 61 | Temp 97.1°F | Ht 68.0 in | Wt 275.2 lb

## 2023-02-08 DIAGNOSIS — Z0000 Encounter for general adult medical examination without abnormal findings: Secondary | ICD-10-CM

## 2023-02-08 DIAGNOSIS — F411 Generalized anxiety disorder: Secondary | ICD-10-CM

## 2023-02-08 DIAGNOSIS — E7800 Pure hypercholesterolemia, unspecified: Secondary | ICD-10-CM

## 2023-02-08 DIAGNOSIS — N951 Menopausal and female climacteric states: Secondary | ICD-10-CM | POA: Diagnosis not present

## 2023-02-08 DIAGNOSIS — Z6841 Body Mass Index (BMI) 40.0 and over, adult: Secondary | ICD-10-CM

## 2023-02-08 DIAGNOSIS — I10 Essential (primary) hypertension: Secondary | ICD-10-CM

## 2023-02-08 MED ORDER — SERTRALINE HCL 25 MG PO TABS
25.0000 mg | ORAL_TABLET | Freq: Every day | ORAL | 1 refills | Status: DC
  Filled 2023-02-08: qty 30, 30d supply, fill #0

## 2023-02-08 MED ORDER — AMLODIPINE BESYLATE 10 MG PO TABS
10.0000 mg | ORAL_TABLET | Freq: Every day | ORAL | 3 refills | Status: DC
  Filled 2023-02-08: qty 90, 90d supply, fill #0
  Filled 2023-05-05: qty 90, 90d supply, fill #1
  Filled 2023-08-11: qty 90, 90d supply, fill #2
  Filled 2023-11-08: qty 90, 90d supply, fill #3

## 2023-02-08 NOTE — Patient Instructions (Signed)
It was great to see you!  Start zoloft 25 mg daily  Follow-up in 1 month to check in on this   Come see Dr Jimmey Ralph or Dr Jon Billings if you need a knee injection!  Take care,  Jarold Motto PA-C

## 2023-02-15 ENCOUNTER — Encounter (INDEPENDENT_AMBULATORY_CARE_PROVIDER_SITE_OTHER): Payer: Self-pay | Admitting: Family Medicine

## 2023-02-15 ENCOUNTER — Ambulatory Visit (INDEPENDENT_AMBULATORY_CARE_PROVIDER_SITE_OTHER): Payer: Commercial Managed Care - PPO | Admitting: Family Medicine

## 2023-02-15 ENCOUNTER — Other Ambulatory Visit (HOSPITAL_COMMUNITY): Payer: Self-pay

## 2023-02-15 VITALS — BP 108/67 | HR 72 | Temp 98.4°F | Ht 68.0 in | Wt 270.0 lb

## 2023-02-15 DIAGNOSIS — R7303 Prediabetes: Secondary | ICD-10-CM

## 2023-02-15 DIAGNOSIS — E7849 Other hyperlipidemia: Secondary | ICD-10-CM

## 2023-02-15 DIAGNOSIS — Z6841 Body Mass Index (BMI) 40.0 and over, adult: Secondary | ICD-10-CM

## 2023-02-15 DIAGNOSIS — E669 Obesity, unspecified: Secondary | ICD-10-CM | POA: Diagnosis not present

## 2023-02-15 MED ORDER — WEGOVY 1.7 MG/0.75ML ~~LOC~~ SOAJ
1.7000 mg | SUBCUTANEOUS | 0 refills | Status: DC
Start: 2023-02-15 — End: 2023-03-09
  Filled 2023-02-15 – 2023-02-24 (×2): qty 3, 28d supply, fill #0

## 2023-02-15 NOTE — Progress Notes (Signed)
Chief Complaint:   OBESITY Megan Mitchell is here to discuss her progress with her obesity treatment plan along with follow-up of her obesity related diagnoses. Megan Mitchell is on keeping a food journal and adhering to recommended goals of 1550-1650 calories and 95+ protein and states she is following her eating plan approximately 75% of the time. Megan Mitchell states she is doing bands and weights 30-40 minutes 4-5 times per week.  Today's visit was #: 13 Starting weight: 273 lbs Starting date: 02/17/2022 Today's weight: 270 lbs Today's date: 02/15/2023 Total lbs lost to date: 3 Total lbs lost since last in-office visit: +1 lb  Interim History: Patient had to miss two doses of medication due to the insurance coverage of Wegovy.  She has started weights, walking, logging food and being mindful of food choices.  She does has occasional indulgences that she is mindful of choices. When she is logging she is ending up around 1200 calories with a decent amount protein.  Next few weeks she may go somewhere but she isn't sure yet.    Subjective:   1. Other hyperlipidemia Patient is on Lipitor daily.  Patient denies myalgias or transaminitis.  2. Prediabetes Last A1c was 5.6, insulin 23.5.  Patient is on a GLP-1.  Assessment/Plan:   1. Other hyperlipidemia Continue Lipitor, no change in dose.  Check fasting labs at next appointment.  2. Prediabetes Check fasting labs at next appointment.  3. BMI 40.0-44.9, adult Refill- Semaglutide-Weight Management (WEGOVY) 1.7 MG/0.75ML SOAJ; Inject 1.7 mg into the skin once a week.  Dispense: 3 mL; Refill: 0  4. Obesity with starting BMI of 41.5 Refill- Semaglutide-Weight Management (WEGOVY) 1.7 MG/0.75ML SOAJ; Inject 1.7 mg into the skin once a week.  Dispense: 3 mL; Refill: 0  Megan Mitchell is currently in the action stage of change. As such, her goal is to continue with weight loss efforts. She has agreed to keeping a food journal and adhering to recommended goals of  1550-1650 calories and 95+ protein daily.   Exercise goals:  As is.  Behavioral modification strategies: increasing lean protein intake, meal planning and cooking strategies, keeping healthy foods in the home, and planning for success.  Megan Mitchell has agreed to follow-up with our clinic in 3-4 weeks. She was informed of the importance of frequent follow-up visits to maximize her success with intensive lifestyle modifications for her multiple health conditions.   Objective:   Blood pressure 108/67, pulse 72, temperature 98.4 F (36.9 C), height  (1.727 m), weight 270 lb (122.5 kg), last menstrual period 06/14/2020, SpO2 99 %. Body mass index is 41.05 kg/m.  General: Cooperative, alert, well developed, in no acute distress. HEENT: Conjunctivae and lids unremarkable. Cardiovascular: Regular rhythm.  Lungs: Normal work of breathing. Neurologic: No focal deficits.   Lab Results  Component Value Date   CREATININE 0.85 10/18/2022   BUN 10 10/18/2022   NA 136 10/18/2022   K 3.7 10/18/2022   CL 97 10/18/2022   CO2 22 10/18/2022   Lab Results  Component Value Date   ALT 17 10/18/2022   AST 21 10/18/2022   ALKPHOS 106 10/18/2022   BILITOT 0.6 10/18/2022   Lab Results  Component Value Date   HGBA1C 5.6 10/18/2022   HGBA1C 5.8 (H) 02/17/2022   HGBA1C 5.5 11/04/2020   HGBA1C 5.6 03/24/2020   HGBA1C 5.8 (H) 10/04/2019   Lab Results  Component Value Date   INSULIN 23.5 10/18/2022   INSULIN 28.8 (H) 02/17/2022   Lab Results  Component Value Date   TSH 1.580 02/03/2022   Lab Results  Component Value Date   CHOL 230 (H) 10/18/2022   HDL 67 10/18/2022   LDLCALC 146 (H) 10/18/2022   TRIG 96 10/18/2022   CHOLHDL 3.2 02/17/2022   Lab Results  Component Value Date   VD25OH 53.1 10/18/2022   VD25OH 30.7 02/17/2022   Lab Results  Component Value Date   WBC 7.1 10/18/2022   HGB 12.1 10/18/2022   HCT 39.1 10/18/2022   MCV 76 (L) 10/18/2022   PLT 509 (H) 10/18/2022   Lab  Results  Component Value Date   IRON 51 10/18/2022   TIBC 467 (H) 10/18/2022   FERRITIN 48 10/18/2022   Attestation Statements:   Reviewed by clinician on day of visit: allergies, medications, problem list, medical history, surgical history, family history, social history, and previous encounter notes.  I, Malcolm Metro, RMA, am acting as transcriptionist for Reuben Likes, MD.  I have reviewed the above documentation for accuracy and completeness, and I agree with the above. - Reuben Likes, MD

## 2023-02-16 ENCOUNTER — Other Ambulatory Visit (HOSPITAL_COMMUNITY): Payer: Self-pay

## 2023-02-22 ENCOUNTER — Telehealth (INDEPENDENT_AMBULATORY_CARE_PROVIDER_SITE_OTHER): Payer: Self-pay | Admitting: Family Medicine

## 2023-02-22 DIAGNOSIS — F411 Generalized anxiety disorder: Secondary | ICD-10-CM | POA: Diagnosis not present

## 2023-02-22 NOTE — Telephone Encounter (Signed)
Your prior authorization for Wegovy 1.7 mg has been approved! MORE INFO Personalized support and financial assistance may be available through the [employer] program. For more information, and to see program requirements, click on the More Info button to the right.  Message from plan: The request has been approved. The authorization is effective from 02/22/2023 to 02/21/2024, as long as the member is enrolled in their current health plan.. Authorization Expiration Date: February 21, 2024.

## 2023-02-22 NOTE — Telephone Encounter (Signed)
Your prior authorization for Megan Mitchell has been approved! MORE INFO Personalized support and financial assistance may be available through the [employer] program. For more information, and to see program requirements, click on the More Info button to the right.  Message from plan: The request has been approved. The authorization is effective from 02/22/2023 to 02/21/2024, as long as the member is enrolled in their current health plan.. Authorization Expiration Date: February 21, 2024.

## 2023-02-22 NOTE — Telephone Encounter (Signed)
Pt needs the [facility] 2.4 dosage sent in to her pharmacy. The Wegovy 1.7 was not approved because this is the patient's 3rd time being prescribed the 1.7. Pt states that the insurance will only cover this medication until the 28th. Patient would like a call back today after 3pm. Pt states she needs this taken care of by tomorrow, as we are closed Friday and the insurance will stop covering [facility] on Sunday 02/26/2023. [facility].

## 2023-02-22 NOTE — Telephone Encounter (Signed)
Prior Auth submitted via cover my meds for Wegovy 1.7 mg.  Waiting for response.

## 2023-02-22 NOTE — Telephone Encounter (Signed)
Patient is needing a prior authozation for [facility] 1.7 medication. [place] Noe

## 2023-02-23 NOTE — Telephone Encounter (Signed)
Patient called today to check on the status of the dose increase for [facility]. Per patient this was discussed with Dr. Lawson Radar at her last OV. Dr. Lawson Radar sent in 1.7 mg because she was not sure if the higher dose would be covered and this is her 3rd time being on the 1.7 mg. She would like a higher dose be sent to [facility] if ok with the higher dose.

## 2023-02-24 ENCOUNTER — Other Ambulatory Visit (HOSPITAL_COMMUNITY): Payer: Self-pay

## 2023-02-28 ENCOUNTER — Other Ambulatory Visit (HOSPITAL_COMMUNITY): Payer: Self-pay

## 2023-03-01 ENCOUNTER — Other Ambulatory Visit (HOSPITAL_COMMUNITY): Payer: Self-pay

## 2023-03-03 ENCOUNTER — Other Ambulatory Visit (HOSPITAL_COMMUNITY): Payer: Self-pay

## 2023-03-04 ENCOUNTER — Other Ambulatory Visit (HOSPITAL_COMMUNITY): Payer: Self-pay

## 2023-03-06 ENCOUNTER — Ambulatory Visit: Payer: Commercial Managed Care - PPO | Admitting: Internal Medicine

## 2023-03-06 ENCOUNTER — Ambulatory Visit: Payer: Commercial Managed Care - PPO | Admitting: Physician Assistant

## 2023-03-06 ENCOUNTER — Encounter: Payer: Self-pay | Admitting: Internal Medicine

## 2023-03-06 VITALS — BP 128/80 | HR 99 | Temp 98.0°F | Ht 68.0 in | Wt 270.0 lb

## 2023-03-06 DIAGNOSIS — G8929 Other chronic pain: Secondary | ICD-10-CM

## 2023-03-06 DIAGNOSIS — M170 Bilateral primary osteoarthritis of knee: Secondary | ICD-10-CM | POA: Diagnosis not present

## 2023-03-06 DIAGNOSIS — M1990 Unspecified osteoarthritis, unspecified site: Secondary | ICD-10-CM

## 2023-03-06 MED ORDER — LIDOCAINE HCL 2 % IJ SOLN
20.0000 mL | Freq: Once | INTRAMUSCULAR | Status: AC
Start: 2023-03-06 — End: 2023-03-06
  Administered 2023-03-06: 400 mg

## 2023-03-06 MED ORDER — TRIAMCINOLONE ACETONIDE 40 MG/ML IJ SUSP
40.0000 mg | Freq: Once | INTRAMUSCULAR | Status: AC
Start: 2023-03-06 — End: 2023-03-06
  Administered 2023-03-06: 40 mg via INTRAMUSCULAR

## 2023-03-06 NOTE — Assessment & Plan Note (Signed)
Poorly controlled pain, interferes with walking, patient reports she was advised she is not a surgical candidate   Full discussion of risks and benefits prior to completed injection Encouraged patient to to discuss with her Primary Care Provider (PCP) about medication(s) for chronic pain management.   PROCEDURE: INTRA-ARTICULAR STEROID KNEE INJECTION  Assisted by Governor Rooks certified medical assistant  Verbal consent obtained and verified for left Knee injection (steroid) Sterile betadine prep. Topical analgesic spray: Ethyl chloride. Joint: left knee Approached in typical fashion with: anteromedial approach Completed without difficulty Meds: 40 mg triamcinolone and 4 mL 2% lidocaine Needle:1 1/2 inch 25 gauge needle Aftercare instructions and Red flags advised - see AVS  You had an injection today.  Things to be aware of after injection are listed below: You may experience no significant improvement or even a slight worsening in your symptoms during the first 24 to 48 hours.  After that we expect your symptoms to improve gradually over the next 2 weeks for the medicine to have its maximal effect.  You should continue to have improvement out to 6 weeks after your injection. I recommends icing the site of the injection for 20 minutes at least 2 times the day of your injection You may shower but no swimming, tub bath or Jacuzzi for 24 hours. You may resume light activities as tolerated unless otherwise directed POSSIBLE STEROID SIDE EFFECTS:  Side effects from injectable steroids tend to be less than when taken orally however you may experience some of the symptoms listed below.  If experienced these should only last for a short period of time. Change in menstrual flow  Edema (swelling)  Increased appetite Skin flushing (redness)  Skin rash/acne  Thrush (oral) Yeast vaginitis    Increased sweating  Depression Increased blood glucose levels Cramping and leg/calf  Euphoria (feeling  happy)  POSSIBLE PROCEDURE SIDE EFFECTS: The side effects of the injection are usually fairly minimal however if you may experience some of the following side effects that are usually self-limited and will is off on their own.  If you are concerned please feel free to call the office with questions:  Increased numbness or tingling  Nausea or vomiting  Swelling or bruising at the injection site   Please call our office if if you experience any of the following symptoms over the next week as these can be signs of infection:   Fever greater than 100.46F  Significant swelling at the injection site  Significant redness or drainage from the injection site  If after 2 weeks you are continuing to have worsening symptoms please call our office to discuss what the next appropriate actions should be including the potential for a return office visit or other diagnostic testing.

## 2023-03-06 NOTE — Progress Notes (Signed)
[facility]: [phone]   Routine Medical Office Visit  Patient:  Megan Mitchell      Age: 52 y.o.       Sex:  female  Date:   03/06/2023 PCP:    Jarold Motto, PA   Today's Healthcare Provider: Lula Olszewski, MD       Assessment and Plan:   Chronic pain of left knee Assessment & Plan: Poorly controlled pain, interferes with walking, patient reports she was advised she is not a surgical candidate   Full discussion of risks and benefits prior to completed injection Encouraged patient to to discuss with her Primary Care Provider (PCP) about medication(s) for chronic pain management.   PROCEDURE: INTRA-ARTICULAR STEROID KNEE INJECTION  Assisted by Governor Rooks certified medical assistant  Verbal consent obtained and verified for left Knee injection (steroid) Sterile betadine prep. Topical analgesic spray: Ethyl chloride. Joint: left knee Approached in typical fashion with: anteromedial approach Completed without difficulty Meds: 40 mg triamcinolone and 4 mL 2% lidocaine Needle:1 1/2 inch 25 gauge needle Aftercare instructions and Red flags advised - see AVS  You had an injection today.  Things to be aware of after injection are listed below: You may experience no significant improvement or even a slight worsening in your symptoms during the first 24 to 48 hours.  After that we expect your symptoms to improve gradually over the next 2 weeks for the medicine to have its maximal effect.  You should continue to have improvement out to 6 weeks after your injection. I recommends icing the site of the injection for 20 minutes at least 2 times the day of your injection You may shower but no swimming, tub bath or Jacuzzi for 24 hours. You may resume light activities as tolerated unless otherwise directed POSSIBLE STEROID SIDE EFFECTS:  Side effects from injectable steroids tend to be less than when taken orally however you may experience some of the  symptoms listed below.  If experienced these should only last for a short period of time. Change in menstrual flow  Edema (swelling)  Increased appetite Skin flushing (redness)  Skin rash/acne  Thrush (oral) Yeast vaginitis    Increased sweating  Depression Increased blood glucose levels Cramping and leg/calf  Euphoria (feeling happy)  POSSIBLE PROCEDURE SIDE EFFECTS: The side effects of the injection are usually fairly minimal however if you may experience some of the following side effects that are usually self-limited and will is off on their own.  If you are concerned please feel free to call the office with questions:  Increased numbness or tingling  Nausea or vomiting  Swelling or bruising at the injection site   Please call our office if if you experience any of the following symptoms over the next week as these can be signs of infection:   Fever greater than 100.20F  Significant swelling at the injection site  Significant redness or drainage from the injection site  If after 2 weeks you are continuing to have worsening symptoms please call our office to discuss what the next appropriate actions should be including the potential for a return office visit or other diagnostic testing.   Orders: -     Triamcinolone Acetonide -     Lidocaine HCl  Primary osteoarthritis of both knees -     Triamcinolone Acetonide -     Lidocaine HCl  Arthritic gait -     Triamcinolone Acetonide -     Lidocaine HCl  Treatment plan discussed and reviewed in detail. . Agreed on patient returning to office if symptoms worsen, persist, or new symptoms develop. Discussed precautions in case of needing to visit the Emergency Department. Answered all patient questions and confirmed understanding and comfort with the plan. Encouraged patient to contact our office if they have any questions or concerns.        Clinical Presentation:   52 y.o. female here today for Knee injection (Just here for knee  injection(s) left knee, very satisfied with relief of my prior injection(s) or R knee.)  HPI   Updated chart data:  Problem  Arthritic Gait  Pain in Right Knee   History meniscal injury, flared with overuse   Pain in Left Knee   No history MRI, patient reports not a surgical candidate Known MRI meniscal injury in the other knee      Reviewed chart data: Active Ambulatory Problems    Diagnosis Date Noted   HTN (hypertension) 03/22/2014   Severe obesity (BMI >= 40) (HCC) 03/22/2014   Anemia 12/26/2014   Pelvic mass in female 04/08/2021   Fibroids 04/08/2021   Urinary tract infection 05/11/2021   Pain in left knee 04/20/2022   Upper respiratory tract infection 07/27/2022   Pain in right knee 11/08/2022   Osteoarthritis of knees, bilateral 11/08/2022   Hyperlipidemia 12/14/2022   Elevated coronary artery calcium score 12/14/2022   Anxiety disorder 01/16/2023   Arthritic gait 03/06/2023   Resolved Ambulatory Problems    Diagnosis Date Noted   No Resolved Ambulatory Problems   Past Medical History:  Diagnosis Date   Allergy    Anxiety    Arthritis    Hypertension    Ovarian cyst     Outpatient Medications Prior to Visit  Medication Sig   amLODipine (NORVASC) 10 MG tablet Take 1 tablet (10 mg total) by mouth daily.   atorvastatin (LIPITOR) 20 MG tablet Take 1 tablet (20 mg total) by mouth daily.   Biotin w/ Vitamins C & E (HAIR/SKIN/NAILS PO) Take by mouth.   Fezolinetant (VEOZAH) 45 MG TABS Take 1 tablet by mouth daily in the afternoon.   fluticasone (FLONASE) 50 MCG/ACT nasal spray Place 2 sprays into both nostrils daily.   ketorolac (TORADOL) 10 MG tablet Take 1 tablet (10 mg total) by mouth every 6 (six) hours as needed.   losartan (COZAAR) 50 MG tablet Take 1/2 tablet (25 mg) by mouth daily. Need physical in April.   Multiple Vitamin (MULTIVITAMIN ADULT PO) Take by mouth.   Probiotic Product (FORTIFY PROBIOTIC WOMENS PO) Take by mouth.   Saline (SIMPLY SALINE)  0.9 % AERS Place 2 sprays into the nose at bedtime. Spray each nostril nightly to rinse sinuses.  Additional unlimited sprays as needed nasal congestion.   Semaglutide-Weight Management (WEGOVY) 1.7 MG/0.75ML SOAJ Inject 1.7 mg into the skin once a week.   sertraline (ZOLOFT) 25 MG tablet Take 1 tablet (25 mg total) by mouth daily.   triamterene-hydrochlorothiazide (MAXZIDE-25) 37.5-25 MG tablet Take 1 tablet by mouth daily.   No facility-administered medications prior to visit.           Clinical Data Analysis:   Physical Exam  BP 128/80 (BP Location: Left Arm, Patient Position: Sitting)   Pulse 99   Temp 98 F (36.7 C) (Temporal)   Ht 5\' 8"  (1.727 m)   Wt 270 lb (122.5 kg)   LMP 06/14/2020 (Approximate)   SpO2 99%   BMI 41.05 kg/m  Wt Readings from Last  10 Encounters:  03/06/23 270 lb (122.5 kg)  02/15/23 270 lb (122.5 kg)  02/08/23 275 lb 4 oz (124.9 kg)  01/16/23 272 lb 9.6 oz (123.7 kg)  01/10/23 269 lb (122 kg)  12/14/22 276 lb 9.6 oz (125.5 kg)  12/13/22 271 lb (122.9 kg)  12/02/22 274 lb (124.3 kg)  11/24/22 277 lb 3.2 oz (125.7 kg)  11/15/22 270 lb (122.5 kg)   Vital signs reviewed.  Nursing notes reviewed. Weight trend reviewed. Abnormalities and Problem-Specific physical exam findings:  severe arthritic gait, tenderness anteromedial knee, limited range of motion. General Appearance:  No acute distress appreciable.   Well-groomed, healthy-appearing female.  Well proportioned with no abnormal fat distribution.  Good muscle tone. Skin: Clear and well-hydrated. Pulmonary:  Normal work of breathing at rest, no respiratory distress apparent. SpO2: 99 %  Musculoskeletal: All extremities are intact.  Neurological:  Awake, alert, oriented, and engaged.  No obvious focal neurological deficits or cognitive impairments.  Sensorium seems unclouded.   Speech is clear and coherent with logical content. Psychiatric:  Appropriate mood, pleasant and cooperative demeanor, thoughtful  and engaged during the exam   --------------------------------    Signed: Lula Olszewski, MD 03/06/2023 10:47 AM

## 2023-03-06 NOTE — Patient Instructions (Addendum)
\It was a pleasure seeing you today!  Your health and satisfaction are my top priorities. If you believe your experience today was worthy of a 5-star rating, I would be grateful for your feedback! Megan Olszewski, MD   [x]    I always recommend close follow up (within 1-2 weeks) with your Primary Care Provider (PCP) for any acute problems.  If you are not doing well:  Return to the office sooner.  If your condition begins worsening or become severe or you can't get a sooner appointment :  go to the emergency room.  [x]    Please carefully review your clinical instructions and notes on MyChart (they will be finished later)   Today's draft of the physician documented plan for today's visit: (final revisions will be visible on MyChart chart later)  Chronic pain of left knee Assessment & Plan: Poorly controlled pain, interferes with walking, patient reports she was advised she is not a surgical candidate   Full discussion of risks and benefits prior to completed injection Encouraged patient to to discuss with her Primary Care Provider (PCP) about medication(s) for chronic pain management.   PROCEDURE: INTRA-ARTICULAR STEROID KNEE INJECTION  Assisted by Governor Rooks certified medical assistant  Verbal consent obtained and verified for left Knee injection (steroid) Sterile betadine prep. Topical analgesic spray: Ethyl chloride. Joint: left knee Approached in typical fashion with: anteromedial approach Completed without difficulty Meds: 40 mg triamcinolone and 4 mL 2% lidocaine Needle:1 1/2 inch 25 gauge needle Aftercare instructions and Red flags advised - see AVS  You had an injection today.  Things to be aware of after injection are listed below: You may experience no significant improvement or even a slight worsening in your symptoms during the first 24 to 48 hours.  After that we expect your symptoms to improve gradually over the next 2 weeks for the medicine to have its maximal effect.   You should continue to have improvement out to 6 weeks after your injection. I recommends icing the site of the injection for 20 minutes at least 2 times the day of your injection You may shower but no swimming, tub bath or Jacuzzi for 24 hours. You may resume light activities as tolerated unless otherwise directed POSSIBLE STEROID SIDE EFFECTS:  Side effects from injectable steroids tend to be less than when taken orally however you may experience some of the symptoms listed below.  If experienced these should only last for a short period of time. Change in menstrual flow  Edema (swelling)  Increased appetite Skin flushing (redness)  Skin rash/acne  Thrush (oral) Yeast vaginitis    Increased sweating  Depression Increased blood glucose levels Cramping and leg/calf  Euphoria (feeling happy)  POSSIBLE PROCEDURE SIDE EFFECTS: The side effects of the injection are usually fairly minimal however if you may experience some of the following side effects that are usually self-limited and will is off on their own.  If you are concerned please feel free to call the office with questions:  Increased numbness or tingling  Nausea or vomiting  Swelling or bruising at the injection site   Please call our office if if you experience any of the following symptoms over the next week as these can be signs of infection:   Fever greater than 100.56F  Significant swelling at the injection site  Significant redness or drainage from the injection site  If after 2 weeks you are continuing to have worsening symptoms please call our office to discuss what the  next appropriate actions should be including the potential for a return office visit or other diagnostic testing.   Orders: -     Triamcinolone Acetonide -     Lidocaine HCl  Primary osteoarthritis of both knees -     Triamcinolone Acetonide -     Lidocaine HCl  Arthritic gait -     Triamcinolone Acetonide -     Lidocaine HCl     QUESTIONS &  CONCERNS: CLINICAL: please contact us via phone [phone] OR MyChart messaging  LAB & IMAGING:   We will call you if the results are significantly abnormal or you don't use MyChart.  Most normal results will be posted to MyChart immediately and have a clinical review message by Dr. Jon Billings posted within 2-3 business days.   If you have not heard from [place] regarding the results in 2 weeks OR if you need priority reporting, please contact this office. MYCHART:  The fastest way to get your results and easiest way to stay in touch with [place] is by activating your My Chart account. Instructions are located on the last page of this paperwork.  BILLING: xray and lab orders are billed from separate companies and questions./concerns should be directed to the invoicing company.  For visit charges please discuss with our administrative services. COMPLAINTS:  please let Dr. Jon Billings know or see the [facility] Administrator - Edwena Felty or [facility], by asking at the front desk: we want you to be satisfied with every experience and we would be grateful for the opportunity to address any problems.

## 2023-03-06 NOTE — Assessment & Plan Note (Deleted)
PROCEDURE: INTRA-ARTICULAR STEROID KNEE INJECTION   Verbal consent obtained and verified for left Knee injection (steroid) Sterile betadine prep. Topical analgesic spray: Ethyl chloride. Joint: left knee Approached in typical fashion with: anteromedial approach Completed without difficulty Meds: 40 mg triamcinolone and 4 mL 2% lidocaine Needle:1 1/2 inch 25 gauge needle Aftercare instructions and Red flags advised - see AVS  You had an injection today.  Things to be aware of after injection are listed below: You may experience no significant improvement or even a slight worsening in your symptoms during the first 24 to 48 hours.  After that we expect your symptoms to improve gradually over the next 2 weeks for the medicine to have its maximal effect.  You should continue to have improvement out to 6 weeks after your injection. Dr. Durene Cal recommends icing the site of the injection for 20 minutes at least 2 times the day of your injection You may shower but no swimming, tub bath or Jacuzzi for 24 hours. If your bandage falls off this does not need to be replaced.  It is appropriate to remove the bandage after 4 hours. You may resume light activities as tolerated unless otherwise directed per Dr. Berline Chough during your visit  POSSIBLE STEROID SIDE EFFECTS:  Side effects from injectable steroids tend to be less than when taken orally however you may experience some of the symptoms listed below.  If experienced these should only last for a short period of time. Change in menstrual flow  Edema (swelling)  Increased appetite Skin flushing (redness)  Skin rash/acne  Thrush (oral) Yeast vaginitis    Increased sweating  Depression Increased blood glucose levels Cramping and leg/calf  Euphoria (feeling happy)  POSSIBLE PROCEDURE SIDE EFFECTS: The side effects of the injection are usually fairly minimal however if you may experience some of the following side effects that are usually self-limited and will  is off on their own.  If you are concerned please feel free to call the office with questions:  Increased numbness or tingling  Nausea or vomiting  Swelling or bruising at the injection site   Please call our office if if you experience any of the following symptoms over the next week as these can be signs of infection:   Fever greater than 100.65F  Significant swelling at the injection site  Significant redness or drainage from the injection site  If after 2 weeks you are continuing to have worsening symptoms please call our office to discuss what the next appropriate actions should be including the potential for a return office visit or other diagnostic testing.

## 2023-03-07 ENCOUNTER — Ambulatory Visit: Payer: Commercial Managed Care - PPO | Admitting: Physician Assistant

## 2023-03-08 ENCOUNTER — Ambulatory Visit: Payer: Commercial Managed Care - PPO | Admitting: Physician Assistant

## 2023-03-08 DIAGNOSIS — F411 Generalized anxiety disorder: Secondary | ICD-10-CM | POA: Diagnosis not present

## 2023-03-09 ENCOUNTER — Other Ambulatory Visit (HOSPITAL_COMMUNITY): Payer: Self-pay

## 2023-03-09 ENCOUNTER — Ambulatory Visit (INDEPENDENT_AMBULATORY_CARE_PROVIDER_SITE_OTHER): Payer: Commercial Managed Care - PPO | Admitting: Family Medicine

## 2023-03-09 ENCOUNTER — Encounter (INDEPENDENT_AMBULATORY_CARE_PROVIDER_SITE_OTHER): Payer: Self-pay | Admitting: Family Medicine

## 2023-03-09 VITALS — BP 114/73 | HR 71 | Temp 98.7°F | Ht 68.0 in | Wt 266.0 lb

## 2023-03-09 DIAGNOSIS — R0602 Shortness of breath: Secondary | ICD-10-CM

## 2023-03-09 DIAGNOSIS — E559 Vitamin D deficiency, unspecified: Secondary | ICD-10-CM

## 2023-03-09 DIAGNOSIS — D509 Iron deficiency anemia, unspecified: Secondary | ICD-10-CM

## 2023-03-09 DIAGNOSIS — I10 Essential (primary) hypertension: Secondary | ICD-10-CM | POA: Diagnosis not present

## 2023-03-09 DIAGNOSIS — R7303 Prediabetes: Secondary | ICD-10-CM

## 2023-03-09 DIAGNOSIS — E7849 Other hyperlipidemia: Secondary | ICD-10-CM

## 2023-03-09 DIAGNOSIS — E669 Obesity, unspecified: Secondary | ICD-10-CM | POA: Diagnosis not present

## 2023-03-09 DIAGNOSIS — Z6841 Body Mass Index (BMI) 40.0 and over, adult: Secondary | ICD-10-CM | POA: Diagnosis not present

## 2023-03-09 MED ORDER — WEGOVY 1.7 MG/0.75ML ~~LOC~~ SOAJ
1.7000 mg | SUBCUTANEOUS | 0 refills | Status: DC
Start: 2023-03-09 — End: 2023-04-10
  Filled 2023-03-09 – 2023-03-22 (×2): qty 3, 28d supply, fill #0

## 2023-03-09 NOTE — Progress Notes (Signed)
Chief Complaint:   OBESITY Megan Mitchell is here to discuss her progress with her obesity treatment plan along with follow-up of her obesity related diagnoses. Megan Mitchell is on keeping a food journal and adhering to recommended goals of 1550-1650 calories and 95+ grams of protein and states she is following her eating plan approximately 95% of the time. Megan Mitchell states she is walking, on the treadmill, lifting weights, and stretching for 30-40 minutes 4 times per week.  Today's visit was #: 14 Starting weight: 273 lbs Starting date: 02/17/2022 Today's weight: 266 lbs Today's date: 03/09/2023 Total lbs lost to date: 7 Total lbs lost since last in-office visit: 4  Interim History: Patient is doing a mixture of both plans averaging around 1600 calories daily.  Total protein is close to goal.    Subjective:   1. SOBOE (shortness of breath on exertion) Vena's initial IC was 1944 on 02/17/2022.  Patient has been very consistent with her intake.  2. Other hyperlipidemia [name] is on Lipitor daily.  Her last LDL was 146, HDL 67, and triglycerides 96.  3. Microcytic anemia [name] is not on any direct iron (on multivitamins).  4. Essential hypertension Megan Mitchell's blood pressure is controlled today.  She denies chest pain, chest pressure, or headache.  5. Prediabetes Megan Mitchell's last A1c was 5.6 in December 2023.  She is on GLP-1 with no GI side effects noted.  6. Vitamin D deficiency Megan Mitchell has had low vitamin D in the past, and she notes fatigue.  Assessment/Plan:   1. SOBOE (shortness of breath on exertion) IC today of 1973; aim for 1600-1650 calories per day with 95 grams of protein per day.  2. Other hyperlipidemia We will check labs today, and we will follow-up at Davette's next visit.  - Lipid Panel With LDL/HDL Ratio  3. Microcytic anemia We will check labs today, and we will follow-up at Emali's next visit.  - CBC - Anemia panel  4. Essential hypertension We will check labs today, and we  will follow-up at Tahira's next visit.  - Comprehensive metabolic panel  5. Prediabetes We will check labs today, and we will follow-up at Cleatus's next visit.  - Hemoglobin A1c - Insulin, random  6. Vitamin D deficiency We will check labs today, and we will follow-up at Megan Mitchell's next visit.  - VITAMIN D 25 Hydroxy (Vit-D Deficiency, Fractures)  7. BMI 40.0-44.9, adult (HCC) - Semaglutide-Weight Management (WEGOVY) 1.7 MG/0.75ML SOAJ; Inject 1.7 mg into the skin once a week.  Dispense: 3 mL; Refill: 0  8. Obesity with starting BMI of 41.5 We will refill Wegovy at 1.7 mg for 1 month.  - Semaglutide-Weight Management (WEGOVY) 1.7 MG/0.75ML SOAJ; Inject 1.7 mg into the skin once a week.  Dispense: 3 mL; Refill: 0  [name] is currently in the action stage of change. As such, her goal is to continue with weight loss efforts. She has agreed to keeping a food journal and adhering to recommended goals of 1600-1650 calories and 95+ grams of protein daily.   Exercise goals: As is.   Behavioral modification strategies: increasing lean protein intake, meal planning and cooking strategies, keeping healthy foods in the home, and planning for success.  Megan Mitchell has agreed to follow-up with our clinic in 3 to 4 weeks. She was informed of the importance of frequent follow-up visits to maximize her success with intensive lifestyle modifications for her multiple health conditions.   Megan Mitchell was informed we would discuss her lab results at her next visit  unless there is a critical issue that needs to be addressed sooner. Megan Mitchell agreed to keep her next visit at the agreed upon time to discuss these results.  Objective:   Blood pressure 114/73, pulse 71, temperature 98.7 F (37.1 C), height 5\' 8"  (1.727 m), weight 266 lb (120.7 kg), last menstrual period 06/14/2020, SpO2 98 %. Body mass index is 40.45 kg/m.  General: Cooperative, alert, well developed, in no acute distress. HEENT: Conjunctivae and lids  unremarkable. Cardiovascular: Regular rhythm.  Lungs: Normal work of breathing. Neurologic: No focal deficits.   Lab Results  Component Value Date   CREATININE 0.74 03/09/2023   BUN 12 03/09/2023   NA 135 03/09/2023   K 3.3 (L) 03/09/2023   CL 96 03/09/2023   CO2 21 03/09/2023   Lab Results  Component Value Date   ALT 15 03/09/2023   AST 16 03/09/2023   ALKPHOS 106 03/09/2023   BILITOT 0.5 03/09/2023   Lab Results  Component Value Date   HGBA1C 5.5 03/09/2023   HGBA1C 5.6 10/18/2022   HGBA1C 5.8 (H) 02/17/2022   HGBA1C 5.5 11/04/2020   HGBA1C 5.6 03/24/2020   Lab Results  Component Value Date   INSULIN 18.1 03/09/2023   INSULIN 23.5 10/18/2022   INSULIN 28.8 (H) 02/17/2022   Lab Results  Component Value Date   TSH 1.580 02/03/2022   Lab Results  Component Value Date   CHOL 164 03/09/2023   HDL 71 03/09/2023   LDLCALC 82 03/09/2023   TRIG 51 03/09/2023   CHOLHDL 3.2 02/17/2022   Lab Results  Component Value Date   VD25OH 62.7 03/09/2023   VD25OH 53.1 10/18/2022   VD25OH 30.7 02/17/2022   Lab Results  Component Value Date   WBC 9.7 03/09/2023   HGB 11.5 03/09/2023   HCT 36.0 03/09/2023   MCV 77 (L) 03/09/2023   PLT 509 (H) 03/09/2023   Lab Results  Component Value Date   IRON 49 03/09/2023   TIBC 394 03/09/2023   FERRITIN 75 03/09/2023   Attestation Statements:   Reviewed by clinician on day of visit: allergies, medications, problem list, medical history, surgical history, family history, social history, and previous encounter notes.   I, Burt Knack, am acting as transcriptionist for Reuben Likes, MD.  I have reviewed the above documentation for accuracy and completeness, and I agree with the above. - Reuben Likes, MD

## 2023-03-10 ENCOUNTER — Ambulatory Visit: Payer: Commercial Managed Care - PPO | Admitting: Internal Medicine

## 2023-03-10 LAB — CBC
Hemoglobin: 11.5 g/dL (ref 11.1–15.9)
MCH: 24.7 pg — ABNORMAL LOW (ref 26.6–33.0)
MCHC: 31.9 g/dL (ref 31.5–35.7)
RBC: 4.66 x10E6/uL (ref 3.77–5.28)
WBC: 9.7 10*3/uL (ref 3.4–10.8)

## 2023-03-10 LAB — COMPREHENSIVE METABOLIC PANEL
AST: 16 IU/L (ref 0–40)
Alkaline Phosphatase: 106 IU/L (ref 44–121)
BUN: 12 mg/dL (ref 6–24)
CO2: 21 mmol/L (ref 20–29)
Chloride: 96 mmol/L (ref 96–106)
Creatinine, Ser: 0.74 mg/dL (ref 0.57–1.00)
Glucose: 74 mg/dL (ref 70–99)
Potassium: 3.3 mmol/L — ABNORMAL LOW (ref 3.5–5.2)
Total Protein: 7.8 g/dL (ref 6.0–8.5)
eGFR: 97 mL/min/{1.73_m2} (ref >59)

## 2023-03-10 LAB — ANEMIA PANEL
Hematocrit: 36 % (ref 34.0–46.6)
Total Iron Binding Capacity: 394 ug/dL (ref 250–450)

## 2023-03-10 LAB — LIPID PANEL WITH LDL/HDL RATIO: Cholesterol, Total: 164 mg/dL (ref 100–199)

## 2023-03-13 LAB — COMPREHENSIVE METABOLIC PANEL
ALT: 15 IU/L (ref 0–32)
Albumin/Globulin Ratio: 1.4 (ref 1.2–2.2)
Albumin: 4.6 g/dL (ref 3.8–4.9)
BUN/Creatinine Ratio: 16 (ref 9–23)
Bilirubin Total: 0.5 mg/dL (ref 0.0–1.2)
Calcium: 10.1 mg/dL (ref 8.7–10.2)
Globulin, Total: 3.2 g/dL (ref 1.5–4.5)
Sodium: 135 mmol/L (ref 134–144)

## 2023-03-13 LAB — LIPID PANEL WITH LDL/HDL RATIO
HDL: 71 mg/dL (ref >39)
LDL Chol Calc (NIH): 82 mg/dL (ref 0–99)
LDL/HDL Ratio: 1.2 ratio (ref 0.0–3.2)
Triglycerides: 51 mg/dL (ref 0–149)
VLDL Cholesterol Cal: 11 mg/dL (ref 5–40)

## 2023-03-13 LAB — INSULIN, RANDOM: INSULIN: 18.1 u[IU]/mL (ref 2.6–24.9)

## 2023-03-13 LAB — HEMOGLOBIN A1C
Est. average glucose Bld gHb Est-mCnc: 111 mg/dL
Hgb A1c MFr Bld: 5.5 % (ref 4.8–5.6)

## 2023-03-13 LAB — ANEMIA PANEL
Ferritin: 75 ng/mL (ref 15–150)
Folate, Hemolysate: 338 ng/mL
Iron Saturation: 12 % — ABNORMAL LOW (ref 15–55)
Iron: 49 ug/dL (ref 27–159)
Retic Ct Pct: 1.6 % (ref 0.6–2.6)
UIBC: 345 ug/dL (ref 131–425)
Vitamin B-12: 948 pg/mL (ref 232–1245)

## 2023-03-13 LAB — CBC
MCV: 77 fL — ABNORMAL LOW (ref 79–97)
Platelets: 509 10*3/uL — ABNORMAL HIGH (ref 150–450)
RDW: 15 % (ref 11.7–15.4)

## 2023-03-13 LAB — VITAMIN D 25 HYDROXY (VIT D DEFICIENCY, FRACTURES): Vit D, 25-Hydroxy: 62.7 ng/mL (ref 30.0–100.0)

## 2023-03-14 ENCOUNTER — Ambulatory Visit: Payer: Commercial Managed Care - PPO | Admitting: Physician Assistant

## 2023-03-14 ENCOUNTER — Encounter: Payer: Self-pay | Admitting: Physician Assistant

## 2023-03-14 ENCOUNTER — Other Ambulatory Visit (HOSPITAL_COMMUNITY): Payer: Self-pay

## 2023-03-14 VITALS — BP 110/70 | HR 66 | Temp 97.5°F | Ht 68.0 in | Wt 266.4 lb

## 2023-03-14 DIAGNOSIS — R051 Acute cough: Secondary | ICD-10-CM

## 2023-03-14 DIAGNOSIS — F419 Anxiety disorder, unspecified: Secondary | ICD-10-CM

## 2023-03-14 MED ORDER — SERTRALINE HCL 50 MG PO TABS
50.0000 mg | ORAL_TABLET | Freq: Every day | ORAL | 1 refills | Status: DC
  Filled 2023-03-14: qty 90, 90d supply, fill #0
  Filled 2023-06-26: qty 90, 90d supply, fill #1

## 2023-03-14 MED ORDER — LOSARTAN POTASSIUM 25 MG PO TABS
25.0000 mg | ORAL_TABLET | Freq: Every day | ORAL | 1 refills | Status: DC
  Filled 2023-03-14 – 2023-04-18 (×2): qty 90, 90d supply, fill #0
  Filled 2023-07-15: qty 90, 90d supply, fill #1

## 2023-03-14 NOTE — Progress Notes (Signed)
Megan Mitchell is a 52 y.o. female here for a follow up of a pre-existing problem.  History of Present Illness:   Chief Complaint  Patient presents with   Anxiety    Pt currently taking Zoloft     HPI  Anxiety She was started on Zoloft 25 mg daily at her last visit She feels as though she has had some improvement with her mood while on this She denies any adverse side effects or worsening thoughts while taking this medication She is interested in increasing this medication Denies SI/HI  Cough She reports that during allergy season she gets a dry cough  she is currently using Flonase regularly with saline nasal spray She has used antihistamines in the past but has not started that this year Denies any bloody cough, night sweats, unintentional weight changes    Past Medical History:  Diagnosis Date   Allergy    seasonal   Anemia    Anxiety    Arthritis    left knee   Fibroids    Hypertension    Ovarian cyst      Social History   Tobacco Use   Smoking status: Never   Smokeless tobacco: Never  Vaping Use   Vaping Use: Never used  Substance Use Topics   Alcohol use: No   Drug use: No    Past Surgical History:  Procedure Laterality Date   CHOLECYSTECTOMY     COLONOSCOPY     ELBOW SURGERY     ROBOTIC ASSISTED TOTAL HYSTERECTOMY WITH BILATERAL SALPINGO OOPHERECTOMY Bilateral 05/11/2021   Procedure: XI ROBOTIC ASSISTED TOTAL HYSTERECTOMY GREATER THAN 250 GRAMS WITH BILATERAL SALPINGO OOPHORECTOMY;MINI LAPAROTOMY FOR CYST DECOMPRESSION;OMENTECTOMY;  Surgeon: Adolphus Birchwood, MD;  Location: [facility];  Service: Gynecology;  Laterality: Bilateral;   TUBAL LIGATION      Family History  Problem Relation Age of Onset   Hypertension Mother    Hypertension Maternal Grandmother    Heart attack Maternal Grandfather    Hypertension Maternal Grandfather    Colon polyps Neg Hx    Esophageal cancer Neg Hx    Rectal cancer Neg Hx    Stomach cancer Neg Hx    Breast cancer Neg  Hx    Colon cancer Neg Hx     Allergies  Allergen Reactions   Sulfa Antibiotics Rash    Makes mouth break out with a rash    Current Medications:   Current Outpatient Medications:    amLODipine (NORVASC) 10 MG tablet, Take 1 tablet (10 mg total) by mouth daily., Disp: 90 tablet, Rfl: 3   atorvastatin (LIPITOR) 20 MG tablet, Take 1 tablet (20 mg total) by mouth daily., Disp: 90 tablet, Rfl: 1   Biotin w/ Vitamins C & E (HAIR/SKIN/NAILS PO), Take by mouth., Disp: , Rfl:    fluticasone (FLONASE) 50 MCG/ACT nasal spray, Place 2 sprays into both nostrils daily., Disp: 16 g, Rfl: 6   ketorolac (TORADOL) 10 MG tablet, Take 1 tablet (10 mg total) by mouth every 6 (six) hours as needed., Disp: 20 tablet, Rfl: 0   Multiple Vitamin (MULTIVITAMIN ADULT PO), Take by mouth., Disp: , Rfl:    OVER THE COUNTER MEDICATION, Memo 2 chews daily for hot flashes, Disp: , Rfl:    Probiotic Product (FORTIFY PROBIOTIC WOMENS PO), Take by mouth., Disp: , Rfl:    Saline (SIMPLY SALINE) 0.9 % AERS, Place 2 sprays into the nose at bedtime. Spray each nostril nightly to rinse sinuses.  Additional unlimited sprays as  needed nasal congestion., Disp: 126 mL, Rfl: 11   Semaglutide-Weight Management (WEGOVY) 1.7 MG/0.75ML SOAJ, Inject 1.7 mg into the skin once a week., Disp: 3 mL, Rfl: 0   sertraline (ZOLOFT) 25 MG tablet, Take 1 tablet (25 mg total) by mouth daily., Disp: 30 tablet, Rfl: 1   triamterene-hydrochlorothiazide (MAXZIDE-25) 37.5-25 MG tablet, Take 1 tablet by mouth daily., Disp: 90 tablet, Rfl: 0   Review of Systems:   ROS Negative unless otherwise specified per HPI.  Vitals:   Vitals:   03/14/23 1446  BP: 110/70  Pulse: 66  Temp: (!) 97.5 F (36.4 C)  TempSrc: Temporal  SpO2: 100%  Weight: 266 lb 6.1 oz (120.8 kg)  Height: 5\' 8"  (1.727 m)     Body mass index is 40.5 kg/m.  Physical Exam:   Physical Exam Vitals and nursing note reviewed.  Constitutional:      General: She is not in acute  distress.    Appearance: She is well-developed. She is not ill-appearing or toxic-appearing.  Cardiovascular:     Rate and Rhythm: Normal rate and regular rhythm.     Pulses: Normal pulses.     Heart sounds: Normal heart sounds, S1 normal and S2 normal.  Pulmonary:     Effort: Pulmonary effort is normal.     Breath sounds: Normal breath sounds.  Skin:    General: Skin is warm and dry.  Neurological:     Mental Status: She is alert.     GCS: GCS eye subscore is 4. GCS verbal subscore is 5. GCS motor subscore is 6.  Psychiatric:        Speech: Speech normal.        Behavior: Behavior normal. Behavior is cooperative.     Assessment and Plan:   Anxiety disorder, unspecified type Improving Increase Zoloft to 50 mg daily Follow-up in 3 to 6 months, sooner if concerns I discussed with patient that if they develop any SI, to tell someone immediately and seek medical attention.  Acute cough Suspect related to allergies Recommend oral antihistamine daily such as Claritin, Allegra, Zyrtec Continue Flonase and saline nasal spray I have asked her to try this for at least 1 week and message me if no improvement, next step would be to change nasal spray, add Singulair, or maybe add PPI   Jarold Motto, PA-C

## 2023-03-14 NOTE — Patient Instructions (Signed)
It was great to see you!    s.  Take care,  Jarold Motto PA-C

## 2023-03-15 ENCOUNTER — Other Ambulatory Visit (HOSPITAL_COMMUNITY): Payer: Self-pay

## 2023-03-16 ENCOUNTER — Encounter (INDEPENDENT_AMBULATORY_CARE_PROVIDER_SITE_OTHER): Payer: Self-pay

## 2023-03-16 ENCOUNTER — Other Ambulatory Visit (HOSPITAL_COMMUNITY): Payer: Self-pay

## 2023-03-21 ENCOUNTER — Other Ambulatory Visit (HOSPITAL_COMMUNITY): Payer: Self-pay

## 2023-03-22 ENCOUNTER — Other Ambulatory Visit (HOSPITAL_COMMUNITY): Payer: Self-pay

## 2023-03-22 DIAGNOSIS — F411 Generalized anxiety disorder: Secondary | ICD-10-CM | POA: Diagnosis not present

## 2023-04-01 ENCOUNTER — Other Ambulatory Visit: Payer: Self-pay | Admitting: Nurse Practitioner

## 2023-04-01 ENCOUNTER — Other Ambulatory Visit (HOSPITAL_COMMUNITY): Payer: Self-pay

## 2023-04-01 DIAGNOSIS — J069 Acute upper respiratory infection, unspecified: Secondary | ICD-10-CM

## 2023-04-03 ENCOUNTER — Other Ambulatory Visit (HOSPITAL_COMMUNITY): Payer: Self-pay

## 2023-04-03 MED ORDER — FLUTICASONE PROPIONATE 50 MCG/ACT NA SUSP
2.0000 | Freq: Every day | NASAL | 6 refills | Status: DC
Start: 2023-04-03 — End: 2023-09-04
  Filled 2023-04-03 – 2023-04-11 (×2): qty 16, 30d supply, fill #0

## 2023-04-04 DIAGNOSIS — F411 Generalized anxiety disorder: Secondary | ICD-10-CM | POA: Diagnosis not present

## 2023-04-10 ENCOUNTER — Ambulatory Visit (INDEPENDENT_AMBULATORY_CARE_PROVIDER_SITE_OTHER): Payer: Commercial Managed Care - PPO | Admitting: Family Medicine

## 2023-04-10 ENCOUNTER — Other Ambulatory Visit (HOSPITAL_COMMUNITY): Payer: Self-pay

## 2023-04-10 ENCOUNTER — Encounter (INDEPENDENT_AMBULATORY_CARE_PROVIDER_SITE_OTHER): Payer: Self-pay | Admitting: Family Medicine

## 2023-04-10 VITALS — BP 119/71 | HR 77 | Temp 98.2°F | Ht 68.0 in | Wt 265.0 lb

## 2023-04-10 DIAGNOSIS — E7849 Other hyperlipidemia: Secondary | ICD-10-CM

## 2023-04-10 DIAGNOSIS — Z6841 Body Mass Index (BMI) 40.0 and over, adult: Secondary | ICD-10-CM

## 2023-04-10 DIAGNOSIS — F5089 Other specified eating disorder: Secondary | ICD-10-CM

## 2023-04-10 DIAGNOSIS — E669 Obesity, unspecified: Secondary | ICD-10-CM | POA: Diagnosis not present

## 2023-04-10 MED ORDER — TOPIRAMATE 25 MG PO TABS
ORAL_TABLET | ORAL | 0 refills | Status: DC
Start: 2023-04-10 — End: 2023-05-10
  Filled 2023-04-10: qty 46, 30d supply, fill #0

## 2023-04-10 MED ORDER — LOMAIRA 8 MG PO TABS
ORAL_TABLET | ORAL | 0 refills | Status: DC
Start: 2023-04-10 — End: 2023-05-10
  Filled 2023-04-10: qty 23, 30d supply, fill #0

## 2023-04-10 NOTE — Progress Notes (Signed)
Chief Complaint:   OBESITY Megan Mitchell is here to discuss her progress with her obesity treatment plan along with follow-up of her obesity related diagnoses. Megan Mitchell is on keeping a food journal and adhering to recommended goals of 1600-1650 calories and 95+ grams of protein and states she is following her eating plan approximately 85% of the time. Megan Mitchell states she is walking, swimming, and weight lifting for 15-60 minutes 2-7 times per week.  Today's visit was #: 15 Starting weight: 273 lbs Starting date: 02/17/2022 Today's weight: 265 lbs Today's date: 04/10/2023 Total lbs lost to date: 8 Total lbs lost since last in-office visit: 1  Interim History: Patient took last injection of wegovy last Friday. Eating has been on point for the last few weeks- vegetables, lots of seafood, lots of beans.  She made a vegetable soup that she froze in order to have food available if she wants soup.  In terms of food intake she is getting enough protein, sometimes weighing it when she isn't doing a prepackaged amount. Next few weeks she would like to be more consistent with her protein intake.  She is interested in pursuing makeshift Qsymia. Not sure what she is doing for July 4th yet.  She noticed she really only wanted fruit and vegetables while away in [place].   Subjective:   1. Other hyperlipidemia Patient's last LDL has significantly improved to 82 from 146, but HDL has increased.  She is on Lipitor 20 mg daily with no side effects of transaminitis or myalgias.  2. Other disorder of eating Patient was on Wegovy with some weight loss however due to lack of coverage the medication is too expensive.  PDMP was checked with no concerns.  Controlled substance contract was signed by the patient.  Assessment/Plan:   1. Other hyperlipidemia Patient will continue Lipitor with no change in dose.  2. Other disorder of eating Patient agreed to start Lomaira at 4 mg, and start topiramate at 25 mg.  -  Phentermine HCl (LOMAIRA) 8 MG TABS; Take 0.5 tablet (4 mg total) by mouth daily for 14 days, THEN 1 tablet (8 mg total) daily for 14 days.  Dispense: 23 tablet; Refill: 0 - topiramate (TOPAMAX) 25 MG tablet; Take 1 tablet (25 mg total) by mouth daily for 14 days, THEN 2 tablets (50 mg total) daily for 14 days.  Dispense: 46 tablet; Refill: 0  3. BMI 40.0-44.9, adult (HCC)  4. Obesity with starting BMI of 41.5 Megan Mitchell is currently in the action stage of change. As such, her goal is to continue with weight loss efforts. She has agreed to keeping a food journal and adhering to recommended goals of 1600-1650 calories and 95+ grams of protein daily.   Exercise goals: As is.   Behavioral modification strategies: increasing lean protein intake, meal planning and cooking strategies, keeping healthy foods in the home, and planning for success.  Megan Mitchell has agreed to follow-up with our clinic in 4 weeks. She was informed of the importance of frequent follow-up visits to maximize her success with intensive lifestyle modifications for her multiple health conditions.   Objective:   Blood pressure 119/71, pulse 77, temperature 98.2 F (36.8 C), height 5\' 8"  (1.727 m), weight 265 lb (120.2 kg), last menstrual period 06/14/2020, SpO2 100 %. Body mass index is 40.29 kg/m.  General: Cooperative, alert, well developed, in no acute distress. HEENT: Conjunctivae and lids unremarkable. Cardiovascular: Regular rhythm.  Lungs: Normal work of breathing. Neurologic: No focal deficits.  Lab Results  Component Value Date   CREATININE 0.74 03/09/2023   BUN 12 03/09/2023   NA 135 03/09/2023   K 3.3 (L) 03/09/2023   CL 96 03/09/2023   CO2 21 03/09/2023   Lab Results  Component Value Date   ALT 15 03/09/2023   AST 16 03/09/2023   ALKPHOS 106 03/09/2023   BILITOT 0.5 03/09/2023   Lab Results  Component Value Date   HGBA1C 5.5 03/09/2023   HGBA1C 5.6 10/18/2022   HGBA1C 5.8 (H) 02/17/2022   HGBA1C 5.5  11/04/2020   HGBA1C 5.6 03/24/2020   Lab Results  Component Value Date   INSULIN 18.1 03/09/2023   INSULIN 23.5 10/18/2022   INSULIN 28.8 (H) 02/17/2022   Lab Results  Component Value Date   TSH 1.580 02/03/2022   Lab Results  Component Value Date   CHOL 164 03/09/2023   HDL 71 03/09/2023   LDLCALC 82 03/09/2023   TRIG 51 03/09/2023   CHOLHDL 3.2 02/17/2022   Lab Results  Component Value Date   VD25OH 62.7 03/09/2023   VD25OH 53.1 10/18/2022   VD25OH 30.7 02/17/2022   Lab Results  Component Value Date   WBC 9.7 03/09/2023   HGB 11.5 03/09/2023   HCT 36.0 03/09/2023   MCV 77 (L) 03/09/2023   PLT 509 (H) 03/09/2023   Lab Results  Component Value Date   IRON 49 03/09/2023   TIBC 394 03/09/2023   FERRITIN 75 03/09/2023   Attestation Statements:   Reviewed by clinician on day of visit: allergies, medications, problem list, medical history, surgical history, family history, social history, and previous encounter notes.   I, Burt Knack, am acting as transcriptionist for Reuben Likes, MD.  I have reviewed the above documentation for accuracy and completeness, and I agree with the above. - Reuben Likes, MD

## 2023-04-11 ENCOUNTER — Other Ambulatory Visit (HOSPITAL_COMMUNITY): Payer: Self-pay

## 2023-04-17 ENCOUNTER — Other Ambulatory Visit (HOSPITAL_COMMUNITY): Payer: Self-pay

## 2023-04-18 ENCOUNTER — Other Ambulatory Visit (HOSPITAL_COMMUNITY): Payer: Self-pay

## 2023-04-22 ENCOUNTER — Other Ambulatory Visit (HOSPITAL_COMMUNITY): Payer: Self-pay

## 2023-05-03 DIAGNOSIS — F411 Generalized anxiety disorder: Secondary | ICD-10-CM | POA: Diagnosis not present

## 2023-05-05 ENCOUNTER — Other Ambulatory Visit (INDEPENDENT_AMBULATORY_CARE_PROVIDER_SITE_OTHER): Payer: Self-pay | Admitting: Family Medicine

## 2023-05-05 ENCOUNTER — Other Ambulatory Visit: Payer: Self-pay | Admitting: Physician Assistant

## 2023-05-05 ENCOUNTER — Other Ambulatory Visit (HOSPITAL_COMMUNITY): Payer: Self-pay

## 2023-05-05 DIAGNOSIS — I10 Essential (primary) hypertension: Secondary | ICD-10-CM

## 2023-05-05 DIAGNOSIS — F5089 Other specified eating disorder: Secondary | ICD-10-CM

## 2023-05-05 MED ORDER — TRIAMTERENE-HCTZ 37.5-25 MG PO TABS
1.00 | ORAL_TABLET | Freq: Every day | ORAL | 0 refills | Status: AC
Start: 2023-05-05 — End: 2024-05-04
  Filled 2023-05-05: qty 90, 90d supply, fill #0

## 2023-05-06 ENCOUNTER — Other Ambulatory Visit (HOSPITAL_COMMUNITY): Payer: Self-pay

## 2023-05-08 ENCOUNTER — Other Ambulatory Visit (HOSPITAL_COMMUNITY): Payer: Self-pay

## 2023-05-10 ENCOUNTER — Encounter (INDEPENDENT_AMBULATORY_CARE_PROVIDER_SITE_OTHER): Payer: Self-pay | Admitting: Family Medicine

## 2023-05-10 ENCOUNTER — Other Ambulatory Visit (HOSPITAL_COMMUNITY): Payer: Self-pay

## 2023-05-10 ENCOUNTER — Telehealth (INDEPENDENT_AMBULATORY_CARE_PROVIDER_SITE_OTHER): Payer: Commercial Managed Care - PPO | Admitting: Family Medicine

## 2023-05-10 VITALS — BP 110/71 | HR 74 | Temp 98.3°F | Ht 68.0 in | Wt 265.0 lb

## 2023-05-10 DIAGNOSIS — E669 Obesity, unspecified: Secondary | ICD-10-CM

## 2023-05-10 DIAGNOSIS — R7303 Prediabetes: Secondary | ICD-10-CM

## 2023-05-10 DIAGNOSIS — F5089 Other specified eating disorder: Secondary | ICD-10-CM | POA: Diagnosis not present

## 2023-05-10 DIAGNOSIS — Z6841 Body Mass Index (BMI) 40.0 and over, adult: Secondary | ICD-10-CM | POA: Diagnosis not present

## 2023-05-10 MED ORDER — TOPIRAMATE 25 MG PO TABS
75.0000 mg | ORAL_TABLET | Freq: Every day | ORAL | 0 refills | Status: DC
Start: 2023-05-10 — End: 2023-06-07
  Filled 2023-05-10: qty 90, 30d supply, fill #0

## 2023-05-10 MED ORDER — LOMAIRA 8 MG PO TABS
12.0000 mg | ORAL_TABLET | Freq: Every day | ORAL | 0 refills | Status: DC
Start: 2023-05-10 — End: 2023-06-07
  Filled 2023-05-10: qty 45, 30d supply, fill #0

## 2023-05-10 NOTE — Progress Notes (Signed)
TeleHealth Visit:  Due to the COVID-19 pandemic, this visit was completed with telemedicine (audio/video) technology to reduce patient and provider exposure as well as to preserve personal protective equipment.   Megan Mitchell has verbally consented to this TeleHealth visit. The patient is located at the [facility] office and the provider is located at home. The participants in this visit include the listed provider and patient. The visit was conducted today via MyChart video.   Chief Complaint: OBESITY Megan Mitchell is here to discuss her progress with her obesity treatment plan along with follow-up of her obesity related diagnoses. Megan Mitchell is on keeping a food journal and adhering to recommended goals of 1600-1650 calories and 95+ grams of protein and states she is following her eating plan approximately 80% of the time. Megan Mitchell states she is lifting weights and walking for 45-50 minutes 4 times per week.  Today's visit was #: 16 Starting weight: 273 lbs Starting date: 02/17/2022 Today's weight: 265 lbs Today's date: 05/10/2023 Total lbs lost to date: 8 Total lbs lost since last in-office visit: 0  Interim History: Patient has been working on following meal plan consistently since last appointment.  Since last appointment she is feeling well on her new medication.  She is weighing her protein more consistently and making sure she is getting closer to goal than she was previously.  She feels like she is getting all food in.  She is incorporating a lot of vegetables in and is avoiding bread.  Has had some hunger and is taking in 2-3 protein shakes a day [employer]).  She is going to a water park with her grandkids in the upcoming few weeks.  Subjective:   1. Other disorder of eating Patient recently started on combination Lomaira and topiramate.  Starting weight 265 pounds (weight stable today).  PDMP was checked with no concerns.  2. Prediabetes Patient's last A1c was better than her prior  1.  Last A1c 5.5 (previously 5.8).  Previously on Wegovy but no insurance coverage.  Assessment/Plan:   1. Other disorder of eating Patient agreed to increase topiramate to 75 mg and increase Lomaira to 12 mg, and we will refill for 1 month.  - Phentermine HCl (LOMAIRA) 8 MG TABS; Take 1.5 tablets (12 mg total) by mouth daily.  Dispense: 45 tablet; Refill: 0 - topiramate (TOPAMAX) 25 MG tablet; Take 3 tablets (75 mg total) by mouth daily.  Dispense: 90 tablet; Refill: 0  2. Prediabetes We will follow-up on patient's labs in November.  3. BMI 40.0-44.9, adult (HCC)  4. Obesity with starting BMI of 41.5 Megan Mitchell is currently in the action stage of change. As such, her goal is to continue with weight loss efforts. She has agreed to keeping a food journal and adhering to recommended goals of 1600-1650 calories and 95+ grams of protein daily.   Exercise goals: As is.   Behavioral modification strategies: increasing lean protein intake, meal planning and cooking strategies, keeping healthy foods in the home, and planning for success.  Megan Mitchell has agreed to follow-up with our clinic in 4 weeks. She was informed of the importance of frequent follow-up visits to maximize her success with intensive lifestyle modifications for her multiple health conditions.  Objective:   VITALS: Per patient if applicable, see vitals. GENERAL: Alert and in no acute distress. CARDIOPULMONARY: No increased WOB. Speaking in clear sentences.  PSYCH: Pleasant and cooperative. Speech normal rate and rhythm. Affect is appropriate. Insight and judgement are appropriate. Attention is focused,  linear, and appropriate.  NEURO: Oriented as arrived to appointment on time with no prompting.   Lab Results  Component Value Date   CREATININE 0.74 03/09/2023   BUN 12 03/09/2023   NA 135 03/09/2023   K 3.3 (L) 03/09/2023   CL 96 03/09/2023   CO2 21 03/09/2023   Lab Results  Component Value Date   ALT 15 03/09/2023   AST 16  03/09/2023   ALKPHOS 106 03/09/2023   BILITOT 0.5 03/09/2023   Lab Results  Component Value Date   HGBA1C 5.5 03/09/2023   HGBA1C 5.6 10/18/2022   HGBA1C 5.8 (H) 02/17/2022   HGBA1C 5.5 11/04/2020   HGBA1C 5.6 03/24/2020   Lab Results  Component Value Date   INSULIN 18.1 03/09/2023   INSULIN 23.5 10/18/2022   INSULIN 28.8 (H) 02/17/2022   Lab Results  Component Value Date   TSH 1.580 02/03/2022   Lab Results  Component Value Date   CHOL 164 03/09/2023   HDL 71 03/09/2023   LDLCALC 82 03/09/2023   TRIG 51 03/09/2023   CHOLHDL 3.2 02/17/2022   Lab Results  Component Value Date   VD25OH 62.7 03/09/2023   VD25OH 53.1 10/18/2022   VD25OH 30.7 02/17/2022   Lab Results  Component Value Date   WBC 9.7 03/09/2023   HGB 11.5 03/09/2023   HCT 36.0 03/09/2023   MCV 77 (L) 03/09/2023   PLT 509 (H) 03/09/2023   Lab Results  Component Value Date   IRON 49 03/09/2023   TIBC 394 03/09/2023   FERRITIN 75 03/09/2023    Attestation Statements:   Reviewed by clinician on day of visit: allergies, medications, problem list, medical history, surgical history, family history, social history, and previous encounter notes.   I, Burt Knack, am acting as transcriptionist for Reuben Likes, MD.  I have reviewed the above documentation for accuracy and completeness, and I agree with the above. - Reuben Likes, MD

## 2023-05-11 ENCOUNTER — Other Ambulatory Visit (HOSPITAL_COMMUNITY): Payer: Self-pay

## 2023-05-12 ENCOUNTER — Other Ambulatory Visit (HOSPITAL_COMMUNITY): Payer: Self-pay

## 2023-05-12 MED ORDER — FLUCONAZOLE 150 MG PO TABS
ORAL_TABLET | ORAL | 1 refills | Status: DC
  Filled 2023-05-12: qty 2, 3d supply, fill #0

## 2023-05-12 MED ORDER — NITROFURANTOIN MONOHYD MACRO 100 MG PO CAPS
100.0000 mg | ORAL_CAPSULE | Freq: Two times a day (BID) | ORAL | 0 refills | Status: DC
  Filled 2023-05-12: qty 6, 3d supply, fill #0

## 2023-05-12 MED ORDER — PHENAZOPYRIDINE HCL 200 MG PO TABS
200.0000 mg | ORAL_TABLET | Freq: Three times a day (TID) | ORAL | 0 refills | Status: DC
  Filled 2023-05-12: qty 15, 5d supply, fill #0

## 2023-05-17 DIAGNOSIS — F411 Generalized anxiety disorder: Secondary | ICD-10-CM | POA: Diagnosis not present

## 2023-05-31 ENCOUNTER — Other Ambulatory Visit (HOSPITAL_COMMUNITY): Payer: Self-pay

## 2023-05-31 ENCOUNTER — Other Ambulatory Visit: Payer: Self-pay | Admitting: Physician Assistant

## 2023-06-01 ENCOUNTER — Other Ambulatory Visit (HOSPITAL_COMMUNITY): Payer: Self-pay

## 2023-06-01 MED ORDER — ATORVASTATIN CALCIUM 20 MG PO TABS
20.0000 mg | ORAL_TABLET | Freq: Every day | ORAL | 1 refills | Status: DC
  Filled 2023-06-01: qty 90, 90d supply, fill #0
  Filled 2023-08-24: qty 90, 90d supply, fill #1

## 2023-06-02 ENCOUNTER — Other Ambulatory Visit (HOSPITAL_COMMUNITY): Payer: Self-pay

## 2023-06-07 ENCOUNTER — Encounter (INDEPENDENT_AMBULATORY_CARE_PROVIDER_SITE_OTHER): Payer: Self-pay | Admitting: Family Medicine

## 2023-06-07 ENCOUNTER — Ambulatory Visit (INDEPENDENT_AMBULATORY_CARE_PROVIDER_SITE_OTHER): Payer: Commercial Managed Care - PPO | Admitting: Family Medicine

## 2023-06-07 ENCOUNTER — Other Ambulatory Visit (HOSPITAL_COMMUNITY): Payer: Self-pay

## 2023-06-07 VITALS — BP 104/67 | HR 74 | Temp 98.2°F | Ht 68.0 in | Wt 263.0 lb

## 2023-06-07 DIAGNOSIS — I10 Essential (primary) hypertension: Secondary | ICD-10-CM

## 2023-06-07 DIAGNOSIS — Z6839 Body mass index (BMI) 39.0-39.9, adult: Secondary | ICD-10-CM | POA: Diagnosis not present

## 2023-06-07 DIAGNOSIS — F5089 Other specified eating disorder: Secondary | ICD-10-CM

## 2023-06-07 DIAGNOSIS — E669 Obesity, unspecified: Secondary | ICD-10-CM

## 2023-06-07 DIAGNOSIS — Z6841 Body Mass Index (BMI) 40.0 and over, adult: Secondary | ICD-10-CM

## 2023-06-07 MED ORDER — LOMAIRA 8 MG PO TABS
12.0000 mg | ORAL_TABLET | Freq: Every day | ORAL | 0 refills | Status: DC
Start: 2023-06-07 — End: 2023-06-29
  Filled 2023-06-07 – 2023-06-08 (×2): qty 45, 30d supply, fill #0

## 2023-06-07 MED ORDER — TOPIRAMATE 25 MG PO TABS
75.0000 mg | ORAL_TABLET | Freq: Every day | ORAL | 0 refills | Status: DC
Start: 2023-06-07 — End: 2023-06-29
  Filled 2023-06-07: qty 90, 30d supply, fill #0

## 2023-06-07 NOTE — Progress Notes (Signed)
Chief Complaint:   OBESITY Megan Mitchell is here to discuss her progress with her obesity treatment plan along with follow-up of her obesity related diagnoses. Megan Mitchell is on keeping a food journal and adhering to recommended goals of 1600-1650 calories and 95+ grams of protein and states she is following her eating plan approximately 90-95% of the time. Megan Mitchell states she is walking, swimming, and lifting weights for 45 minutes 4 times per week.  Today's visit was #: 17 Starting weight: 273 lbs Starting date: 02/17/2022 Today's weight: 263 lbs Today's date: 06/07/2023 Total lbs lost to date: 10 Total lbs lost since last in-office visit: 2  Interim History: Patient returns for follow up.  Patient is journaling and ending up around 2600 calories for the day and isn't sure of total protein amount.  She is getting in roughly over 110g of protein daily.  She voices that she feels the calories are high likely from how she is preparing her chicken and the bread she is having.  She is occasionally incorporating starch like baked potato that has butter and sour cream.  She is being mindful of looking at total protein but is still having some indulgent choices infiltrating her total intake.  She is going down to [facility] from Sept 17-20th.   Subjective:   1. Essential hypertension Patient's blood pressure is very well-managed.  She denies chest pain, chest pressure, or headache.  2. Other disorder of eating Patient is on combination Lomaira and topiramate daily.  Her starting weight was of 265 pounds, and she is now at 263 pounds (needs 9 pounds more by the end of September or she will need the max dose).   Assessment/Plan:   1. Essential hypertension Patient will continue Maxzide that 25 at her current dose.  2. Other disorder of eating Patient will continue Lomaira 12 mg and topiramate 75 mg, and we will refill both for 1 month.  - Phentermine HCl (LOMAIRA) 8 MG TABS; Take 1.5 tablets (12 mg  total) by mouth daily.  Dispense: 45 tablet; Refill: 0 - topiramate (TOPAMAX) 25 MG tablet; Take 3 tablets (75 mg total) by mouth daily.  Dispense: 90 tablet; Refill: 0  3. Obesity with starting BMI of 41.5  4. BMI 40.0-44.9, adult (HCC) Vung is currently in the action stage of change. As such, her goal is to continue with weight loss efforts. She has agreed to keeping a food journal and adhering to recommended goals of 1600-1800 calories and 115+ grams of protein daily.   Exercise goals: As is.   Behavioral modification strategies: increasing lean protein intake, meal planning and cooking strategies, keeping healthy foods in the home, and planning for success.  Terrance has agreed to follow-up with our clinic in 3 to 4 weeks. She was informed of the importance of frequent follow-up visits to maximize her success with intensive lifestyle modifications for her multiple health conditions.   Objective:   Blood pressure 104/67, pulse 74, temperature 98.2 F (36.8 C), height 5\' 8"  (1.727 m), weight 263 lb (119.3 kg), last menstrual period 06/14/2020, SpO2 99%. Body mass index is 39.99 kg/m.  General: Cooperative, alert, well developed, in no acute distress. HEENT: Conjunctivae and lids unremarkable. Cardiovascular: Regular rhythm.  Lungs: Normal work of breathing. Neurologic: No focal deficits.   Lab Results  Component Value Date   CREATININE 0.74 03/09/2023   BUN 12 03/09/2023   NA 135 03/09/2023   K 3.3 (L) 03/09/2023   CL 96 03/09/2023  CO2 21 03/09/2023   Lab Results  Component Value Date   ALT 15 03/09/2023   AST 16 03/09/2023   ALKPHOS 106 03/09/2023   BILITOT 0.5 03/09/2023   Lab Results  Component Value Date   HGBA1C 5.5 03/09/2023   HGBA1C 5.6 10/18/2022   HGBA1C 5.8 (H) 02/17/2022   HGBA1C 5.5 11/04/2020   HGBA1C 5.6 03/24/2020   Lab Results  Component Value Date   INSULIN 18.1 03/09/2023   INSULIN 23.5 10/18/2022   INSULIN 28.8 (H) 02/17/2022   Lab  Results  Component Value Date   TSH 1.580 02/03/2022   Lab Results  Component Value Date   CHOL 164 03/09/2023   HDL 71 03/09/2023   LDLCALC 82 03/09/2023   TRIG 51 03/09/2023   CHOLHDL 3.2 02/17/2022   Lab Results  Component Value Date   VD25OH 62.7 03/09/2023   VD25OH 53.1 10/18/2022   VD25OH 30.7 02/17/2022   Lab Results  Component Value Date   WBC 9.7 03/09/2023   HGB 11.5 03/09/2023   HCT 36.0 03/09/2023   MCV 77 (L) 03/09/2023   PLT 509 (H) 03/09/2023   Lab Results  Component Value Date   IRON 49 03/09/2023   TIBC 394 03/09/2023   FERRITIN 75 03/09/2023   Attestation Statements:   Reviewed by clinician on day of visit: allergies, medications, problem list, medical history, surgical history, family history, social history, and previous encounter notes.   I, Burt Knack, am acting as transcriptionist for Reuben Likes, MD.  I have reviewed the above documentation for accuracy and completeness, and I agree with the above. - Reuben Likes, MD

## 2023-06-08 ENCOUNTER — Other Ambulatory Visit (HOSPITAL_COMMUNITY): Payer: Self-pay

## 2023-06-09 ENCOUNTER — Other Ambulatory Visit: Payer: Self-pay

## 2023-06-09 ENCOUNTER — Other Ambulatory Visit (HOSPITAL_COMMUNITY): Payer: Self-pay

## 2023-06-14 DIAGNOSIS — F411 Generalized anxiety disorder: Secondary | ICD-10-CM | POA: Diagnosis not present

## 2023-06-26 ENCOUNTER — Other Ambulatory Visit (HOSPITAL_COMMUNITY): Payer: Self-pay

## 2023-06-28 DIAGNOSIS — F411 Generalized anxiety disorder: Secondary | ICD-10-CM | POA: Diagnosis not present

## 2023-06-29 ENCOUNTER — Other Ambulatory Visit (HOSPITAL_COMMUNITY): Payer: Self-pay

## 2023-06-29 ENCOUNTER — Encounter (INDEPENDENT_AMBULATORY_CARE_PROVIDER_SITE_OTHER): Payer: Self-pay | Admitting: Family Medicine

## 2023-06-29 ENCOUNTER — Ambulatory Visit (INDEPENDENT_AMBULATORY_CARE_PROVIDER_SITE_OTHER): Payer: Commercial Managed Care - PPO | Admitting: Family Medicine

## 2023-06-29 VITALS — BP 103/66 | HR 75 | Temp 98.7°F | Ht 68.0 in | Wt 259.0 lb

## 2023-06-29 DIAGNOSIS — F5089 Other specified eating disorder: Secondary | ICD-10-CM

## 2023-06-29 DIAGNOSIS — Z6839 Body mass index (BMI) 39.0-39.9, adult: Secondary | ICD-10-CM | POA: Diagnosis not present

## 2023-06-29 DIAGNOSIS — I10 Essential (primary) hypertension: Secondary | ICD-10-CM

## 2023-06-29 DIAGNOSIS — E669 Obesity, unspecified: Secondary | ICD-10-CM

## 2023-06-29 MED ORDER — AMLODIPINE BESYLATE 10 MG PO TABS
5.0000 mg | ORAL_TABLET | Freq: Every day | ORAL | Status: DC
Start: 2023-06-29 — End: 2023-09-04

## 2023-06-29 MED ORDER — TOPIRAMATE 25 MG PO TABS
75.0000 mg | ORAL_TABLET | Freq: Every day | ORAL | 0 refills | Status: AC
Start: 2023-06-29 — End: 2023-08-04
  Filled 2023-06-29 – 2023-07-04 (×3): qty 90, 30d supply, fill #0

## 2023-06-29 MED ORDER — LOMAIRA 8 MG PO TABS
12.0000 mg | ORAL_TABLET | Freq: Every day | ORAL | 0 refills | Status: AC
Start: 2023-06-29 — End: 2023-08-04
  Filled 2023-06-29 – 2023-07-04 (×2): qty 45, 30d supply, fill #0

## 2023-06-29 NOTE — Progress Notes (Unsigned)
Chief Complaint:   OBESITY Megan Mitchell is here to discuss her progress with her obesity treatment plan along with follow-up of her obesity related diagnoses. Megan Mitchell is on keeping a food journal and adhering to recommended goals of 1600-1800 calories and 115+ grams of protein and states she is following her eating plan approximately 85% of the time. Megan Mitchell states she is walking, lifting weights, and using resistance bands for 60 minutes 4-5 times per week.  Today's visit was #: 18 Starting weight: 273 lbs Starting date: 02/17/2022 Today's weight: 259 lbs Today's date: 06/29/2023 Total lbs lost to date: 14 Total lbs lost since last in-office visit: 4  Interim History: Patient has had a good last month.  She is feeling more energy and able to do more and is feeling great.  She is getting in around 1700 calories and getting in her protein.  She is walking with ankle weights and is walking 4-5 times a week.  She is planning to go to the beach for a few days over holiday weekend.   Subjective:   1. Essential hypertension Patient's blood pressure is on the lower end of normal. She denies dizziness or lightheadedness.   2. Other disorder of eating PDMP was checked with no concerns. Patient's starting weight was of 265 lbs, and she is now at 259 lbs (down 6 lbs and she needs to lose an additional 7 lbs).   Assessment/Plan:   1. Essential hypertension Patient agreed to decrease amlodipine to 5 mg daily.  We will follow-up on patient's blood pressure at her next appointment.  - amLODipine (NORVASC) 10 MG tablet; Take 0.5 tablets (5 mg total) by mouth daily.  2. Other disorder of eating Patient will continue her medications, and we will refill Lomaira 12 mg and topiramate 75 mg for 1 month.  - Phentermine HCl (LOMAIRA) 8 MG TABS; Take 1.5 tablets (12 mg total) by mouth daily.  Dispense: 45 tablet; Refill: 0 - topiramate (TOPAMAX) 25 MG tablet; Take 3 tablets (75 mg total) by mouth daily.   Dispense: 90 tablet; Refill: 0  3. BMI 39.0-39.9,adult  4. Obesity with starting BMI of 41.5 Megan Mitchell is currently in the action stage of change. As such, her goal is to continue with weight loss efforts. She has agreed to keeping a food journal and adhering to recommended goals of 1600-1800 calories and 115+ grams of protein daily and the Pescatarian Plan.   Exercise goals: As is.   Behavioral modification strategies: increasing lean protein intake, meal planning and cooking strategies, keeping healthy foods in the home, and planning for success.  Megan Mitchell has agreed to follow-up with our clinic in 4 weeks. She was informed of the importance of frequent follow-up visits to maximize her success with intensive lifestyle modifications for her multiple health conditions.   Objective:   Blood pressure 103/66, pulse 75, temperature 98.7 F (37.1 C), height 5\' 8"  (1.727 m), weight 259 lb (117.5 kg), last menstrual period 06/14/2020, SpO2 97%. Body mass index is 39.38 kg/m.  General: Cooperative, alert, well developed, in no acute distress. HEENT: Conjunctivae and lids unremarkable. Cardiovascular: Regular rhythm.  Lungs: Normal work of breathing. Neurologic: No focal deficits.   Lab Results  Component Value Date   CREATININE 0.74 03/09/2023   BUN 12 03/09/2023   NA 135 03/09/2023   K 3.3 (L) 03/09/2023   CL 96 03/09/2023   CO2 21 03/09/2023   Lab Results  Component Value Date   ALT 15 03/09/2023  AST 16 03/09/2023   ALKPHOS 106 03/09/2023   BILITOT 0.5 03/09/2023   Lab Results  Component Value Date   HGBA1C 5.5 03/09/2023   HGBA1C 5.6 10/18/2022   HGBA1C 5.8 (H) 02/17/2022   HGBA1C 5.5 11/04/2020   HGBA1C 5.6 03/24/2020   Lab Results  Component Value Date   INSULIN 18.1 03/09/2023   INSULIN 23.5 10/18/2022   INSULIN 28.8 (H) 02/17/2022   Lab Results  Component Value Date   TSH 1.580 02/03/2022   Lab Results  Component Value Date   CHOL 164 03/09/2023   HDL 71  03/09/2023   LDLCALC 82 03/09/2023   TRIG 51 03/09/2023   CHOLHDL 3.2 02/17/2022   Lab Results  Component Value Date   VD25OH 62.7 03/09/2023   VD25OH 53.1 10/18/2022   VD25OH 30.7 02/17/2022   Lab Results  Component Value Date   WBC 9.7 03/09/2023   HGB 11.5 03/09/2023   HCT 36.0 03/09/2023   MCV 77 (L) 03/09/2023   PLT 509 (H) 03/09/2023   Lab Results  Component Value Date   IRON 49 03/09/2023   TIBC 394 03/09/2023   FERRITIN 75 03/09/2023   Attestation Statements:   Reviewed by clinician on day of visit: allergies, medications, problem list, medical history, surgical history, family history, social history, and previous encounter notes.   I, Burt Knack, am acting as transcriptionist for Reuben Likes, MD.  I have reviewed the above documentation for accuracy and completeness, and I agree with the above. - Reuben Likes, MD

## 2023-06-30 ENCOUNTER — Other Ambulatory Visit (HOSPITAL_COMMUNITY): Payer: Self-pay

## 2023-07-01 ENCOUNTER — Other Ambulatory Visit (HOSPITAL_COMMUNITY): Payer: Self-pay

## 2023-07-04 ENCOUNTER — Other Ambulatory Visit: Payer: Self-pay

## 2023-07-04 ENCOUNTER — Other Ambulatory Visit (HOSPITAL_COMMUNITY): Payer: Self-pay

## 2023-07-05 ENCOUNTER — Other Ambulatory Visit: Payer: Self-pay

## 2023-07-05 ENCOUNTER — Other Ambulatory Visit (HOSPITAL_COMMUNITY): Payer: Self-pay

## 2023-07-06 ENCOUNTER — Other Ambulatory Visit (HOSPITAL_COMMUNITY): Payer: Self-pay

## 2023-07-10 ENCOUNTER — Encounter: Payer: Self-pay | Admitting: Physician Assistant

## 2023-07-10 ENCOUNTER — Other Ambulatory Visit (HOSPITAL_COMMUNITY): Payer: Self-pay

## 2023-07-10 ENCOUNTER — Ambulatory Visit (INDEPENDENT_AMBULATORY_CARE_PROVIDER_SITE_OTHER): Payer: Commercial Managed Care - PPO | Admitting: Physician Assistant

## 2023-07-10 VITALS — BP 110/74 | HR 77 | Temp 98.4°F | Ht 68.0 in | Wt 262.6 lb

## 2023-07-10 DIAGNOSIS — F419 Anxiety disorder, unspecified: Secondary | ICD-10-CM | POA: Diagnosis not present

## 2023-07-10 MED ORDER — SERTRALINE HCL 100 MG PO TABS
100.0000 mg | ORAL_TABLET | Freq: Every day | ORAL | 1 refills | Status: DC
  Filled 2023-07-10: qty 90, 90d supply, fill #0

## 2023-07-10 NOTE — Progress Notes (Signed)
Megan Mitchell is a 52 y.o. female here for a follow up of a pre-existing problem.  History of Present Illness:   Chief Complaint  Patient presents with   Anxiety    Pt stated that she feels ok but she had to up the dose last week    HPI  Anxiety  She reports compliance and good tolerance of Zoloft 50 mg daily. She typically takes it before work around 7 am she states that around 1 pm it's effect would start to lessen.  She has increased her dosage to 100 mg this past week as she felt she needed something stronger to manage her symptoms.  She is experiencing some sleepiness as a side effect and occasional nausea that has resolved.  She has been dealing with stress due to her work.  She states that she's been seeing a therapist recently.    Past Medical History:  Diagnosis Date   Allergy    seasonal   Anemia    Anxiety    Arthritis    left knee   Fibroids    Hypertension    Ovarian cyst      Social History   Tobacco Use   Smoking status: Never   Smokeless tobacco: Never  Vaping Use   Vaping status: Never Used  Substance Use Topics   Alcohol use: No   Drug use: No    Past Surgical History:  Procedure Laterality Date   CHOLECYSTECTOMY     COLONOSCOPY     ELBOW SURGERY     ROBOTIC ASSISTED TOTAL HYSTERECTOMY WITH BILATERAL SALPINGO OOPHERECTOMY Bilateral 05/11/2021   Procedure: XI ROBOTIC ASSISTED TOTAL HYSTERECTOMY GREATER THAN 250 GRAMS WITH BILATERAL SALPINGO OOPHORECTOMY;MINI LAPAROTOMY FOR CYST DECOMPRESSION;OMENTECTOMY;  Surgeon: Adolphus Birchwood, MD;  Location: [facility];  Service: Gynecology;  Laterality: Bilateral;   TUBAL LIGATION      Family History  Problem Relation Age of Onset   Hypertension Mother    Hypertension Maternal Grandmother    Heart attack Maternal Grandfather    Hypertension Maternal Grandfather    Colon polyps Neg Hx    Esophageal cancer Neg Hx    Rectal cancer Neg Hx    Stomach cancer Neg Hx    Breast cancer Neg Hx    Colon cancer Neg  Hx     Allergies  Allergen Reactions   Sulfa Antibiotics Rash    Makes mouth break out with a rash    Current Medications:   Current Outpatient Medications:    amLODipine (NORVASC) 10 MG tablet, Take 0.5 tablets (5 mg total) by mouth daily., Disp: , Rfl:    atorvastatin (LIPITOR) 20 MG tablet, Take 1 tablet (20 mg total) by mouth daily., Disp: 90 tablet, Rfl: 1   fluticasone (FLONASE) 50 MCG/ACT nasal spray, Place 2 sprays into both nostrils daily., Disp: 16 g, Rfl: 6   losartan (COZAAR) 25 MG tablet, Take 1 tablet (25 mg total) by mouth daily., Disp: 90 tablet, Rfl: 1   Multiple Vitamin (MULTIVITAMIN ADULT PO), Take by mouth., Disp: , Rfl:    OVER THE COUNTER MEDICATION, Memo 2 chews daily for hot flashes, Disp: , Rfl:    Phentermine HCl (LOMAIRA) 8 MG TABS, Take 1.5 tablets (12 mg total) by mouth daily., Disp: 45 tablet, Rfl: 0   Probiotic Product (FORTIFY PROBIOTIC WOMENS PO), Take by mouth., Disp: , Rfl:    Saline (SIMPLY SALINE) 0.9 % AERS, Place 2 sprays into the nose at bedtime. Spray each nostril nightly to rinse  sinuses.  Additional unlimited sprays as needed nasal congestion., Disp: 126 mL, Rfl: 11   sertraline (ZOLOFT) 100 MG tablet, Take 1 tablet (100 mg total) by mouth daily., Disp: 90 tablet, Rfl: 1   topiramate (TOPAMAX) 25 MG tablet, Take 3 tablets (75 mg total) by mouth daily., Disp: 90 tablet, Rfl: 0   triamterene-hydrochlorothiazide (MAXZIDE-25) 37.5-25 MG tablet, Take 1 tablet by mouth daily., Disp: 90 tablet, Rfl: 0   Biotin w/ Vitamins C & E (HAIR/SKIN/NAILS PO), Take by mouth. (Patient not taking: Reported on 07/10/2023), Disp: , Rfl:    fluconazole (DIFLUCAN) 150 MG tablet, Take 1 tablet by mouth now, may repeat in 3 days as needed (Patient not taking: Reported on 07/10/2023), Disp: 2 tablet, Rfl: 1   Review of Systems:   Review of Systems  Psychiatric/Behavioral:  The patient is nervous/anxious.     Vitals:   Vitals:   07/10/23 1455  BP: 110/74  Pulse: 77   Temp: 98.4 F (36.9 C)  SpO2: 99%  Weight: 262 lb 9.6 oz (119.1 kg)  Height: 5\' 8"  (1.727 m)     Body mass index is 39.93 kg/m.  Physical Exam:   Physical Exam Vitals and nursing note reviewed.  Constitutional:      General: She is not in acute distress.    Appearance: She is well-developed. She is not ill-appearing or toxic-appearing.  Cardiovascular:     Rate and Rhythm: Normal rate and regular rhythm.     Pulses: Normal pulses.     Heart sounds: Normal heart sounds, S1 normal and S2 normal.  Pulmonary:     Effort: Pulmonary effort is normal.     Breath sounds: Normal breath sounds.  Skin:    General: Skin is warm and dry.  Neurological:     Mental Status: She is alert.     GCS: GCS eye subscore is 4. GCS verbal subscore is 5. GCS motor subscore is 6.  Psychiatric:        Speech: Speech normal.        Behavior: Behavior normal. Behavior is cooperative.     Assessment and Plan:   Anxiety disorder, unspecified type Uncontrolled Increase Zoloft to 100 mg as she has already done so and maintain this for at least 1 month After 1 month she may increase to 150 mg and reach out to [place] if still having inadequate treatment of symptoms Follow-up in office in 3-6 months, sooner if concerns  Jarold Motto, PA-C  I,Safa M Kadhim,acting as a scribe for Jarold Motto, PA.,have documented all relevant documentation on the behalf of Jarold Motto, PA,as directed by  Jarold Motto, PA while in the presence of Jarold Motto, [place].   I, Jarold Motto, [place], have reviewed all documentation for this visit. The documentation on 07/10/23 for the exam, diagnosis, procedures, and orders are all accurate and complete.

## 2023-07-13 DIAGNOSIS — F411 Generalized anxiety disorder: Secondary | ICD-10-CM | POA: Diagnosis not present

## 2023-07-14 ENCOUNTER — Other Ambulatory Visit (HOSPITAL_COMMUNITY): Payer: Self-pay

## 2023-07-20 NOTE — Telephone Encounter (Signed)
error 

## 2023-07-21 ENCOUNTER — Other Ambulatory Visit (HOSPITAL_COMMUNITY): Payer: Self-pay

## 2023-07-26 DIAGNOSIS — F411 Generalized anxiety disorder: Secondary | ICD-10-CM | POA: Diagnosis not present

## 2023-07-29 ENCOUNTER — Other Ambulatory Visit: Payer: Self-pay | Admitting: Physician Assistant

## 2023-07-29 ENCOUNTER — Other Ambulatory Visit (HOSPITAL_COMMUNITY): Payer: Self-pay

## 2023-07-29 DIAGNOSIS — I10 Essential (primary) hypertension: Secondary | ICD-10-CM

## 2023-07-31 ENCOUNTER — Other Ambulatory Visit: Payer: Self-pay | Admitting: Physician Assistant

## 2023-07-31 ENCOUNTER — Other Ambulatory Visit (HOSPITAL_COMMUNITY): Payer: Self-pay

## 2023-07-31 DIAGNOSIS — I10 Essential (primary) hypertension: Secondary | ICD-10-CM

## 2023-07-31 MED ORDER — TRIAMTERENE-HCTZ 37.5-25 MG PO TABS
1.0000 | ORAL_TABLET | Freq: Every day | ORAL | 0 refills | Status: DC
  Filled 2023-07-31: qty 90, 90d supply, fill #0

## 2023-08-01 ENCOUNTER — Other Ambulatory Visit (HOSPITAL_COMMUNITY): Payer: Self-pay

## 2023-08-02 ENCOUNTER — Ambulatory Visit (INDEPENDENT_AMBULATORY_CARE_PROVIDER_SITE_OTHER): Payer: Commercial Managed Care - PPO | Admitting: Physician Assistant

## 2023-08-02 ENCOUNTER — Encounter (INDEPENDENT_AMBULATORY_CARE_PROVIDER_SITE_OTHER): Payer: Self-pay | Admitting: Physician Assistant

## 2023-08-02 VITALS — BP 105/69 | HR 78 | Temp 98.4°F | Ht 68.0 in | Wt 258.0 lb

## 2023-08-02 DIAGNOSIS — R7303 Prediabetes: Secondary | ICD-10-CM | POA: Diagnosis not present

## 2023-08-02 DIAGNOSIS — I10 Essential (primary) hypertension: Secondary | ICD-10-CM | POA: Diagnosis not present

## 2023-08-02 DIAGNOSIS — Z6839 Body mass index (BMI) 39.0-39.9, adult: Secondary | ICD-10-CM

## 2023-08-02 DIAGNOSIS — F5089 Other specified eating disorder: Secondary | ICD-10-CM

## 2023-08-02 DIAGNOSIS — E669 Obesity, unspecified: Secondary | ICD-10-CM | POA: Diagnosis not present

## 2023-08-02 MED ORDER — TOPIRAMATE 25 MG PO TABS
75.0000 mg | ORAL_TABLET | Freq: Every day | ORAL | 0 refills | Status: DC
  Filled 2023-08-02: qty 90, 30d supply, fill #0

## 2023-08-02 MED ORDER — LOMAIRA 8 MG PO TABS
12.0000 mg | ORAL_TABLET | Freq: Every day | ORAL | 0 refills | Status: DC
Start: 2023-08-02 — End: 2023-09-04
  Filled 2023-08-02: qty 45, 30d supply, fill #0

## 2023-08-02 NOTE — Progress Notes (Signed)
.[facility]  Office: [phone]  /  Fax: [phone]  WEIGHT SUMMARY AND BIOMETRICS  Vitals Temp: 98.4 F (36.9 C) BP: 105/69 Pulse Rate: 78 SpO2: 99 %   Anthropometric Measurements Height: 5\' 8"  (1.727 m) Weight: 258 lb (117 kg) BMI (Calculated): 39.24 Weight at Last Visit: 259 lb Weight Lost Since Last Visit: 1 lb Weight Gained Since Last Visit: 0 Starting Weight: 273 lb Total Weight Loss (lbs): 15 lb (6.804 kg) Peak Weight: 286 lb   Body Composition  Body Fat %: 50.4 % Fat Mass (lbs): 130.2 lbs Muscle Mass (lbs): 121.6 lbs Total Body Water (lbs): 96.8 lbs Visceral Fat Rating : 15   Other Clinical Data Fasting: no Labs: no Today's Visit #: 20 Starting Date: 02/17/22     HPI  Chief Complaint: OBESITY  Megan Mitchell is here to discuss her progress with her obesity treatment plan. She is on the keeping a food journal and adhering to recommended goals of 1600-1800 calories and 115 grams of protein and the Pescatarian Plan and states she is following her eating plan approximately 85 % of the time. She states she is exercising walking/ weights 45-50 minutes 4 times per week.   Interval History:  Since last office visit she is down 1 lb.   38, a 52 year old female with a history of obesity, prediabetes, hypertension, and emotional eating/polyphagia, presents for a follow-up of her obesity treatment plan.  She has been adhering to a pescatarian diet and has been physically active with walking and lifting exercises.  She reports feeling good and has noticed her clothes becoming looser consistent with her weight loss.  She has a goal weight in mind, but is open to losing more weight to reduce her overall adipose percent and visceral adipose rating.  She is also considering skin removal surgery due to excess skin from weight loss, but has no areas of irritation.  She is currently on Lomaira and topiramate for weight loss, which she tolerates well with no reported side  effects.  She has a strategy for dealing with potential dietary lapses, such as during travel or celebrations, and is able to get back on track quickly.  Pharmacotherapy: Lomaira 12 mg and Topiramate 75mg  daily. No side effects. I have consulted the [place] Controlled Substances Registry for this patient, and feel the risk/benefit ratio today is favorable for proceeding with this prescription for a controlled substance. No aberrancies noted.   TREATMENT PLAN FOR OBESITY: Obesity Patient is making progress with weight loss, currently at 258 lbs. She has a goal weight of 250 lbs, but is open to losing more to improve her adipose percentage and visceral adipose rating. She is adhering to a pescatarian diet and is satisfied with her current plan. She is also considering abdominoplasty for excess skin removal after reaching her weight loss goal. -Continue current diet and exercise regimen. -Continue/Refill Lomaira 12 mg and Topiramate 75mg  daily. -Consider increasing medication dosage if weight loss plateaus. -Consider referral to plastic surgery for abdominoplasty consultation once weight is stable for at least six months. Recommended Dietary Goals  Megan Mitchell is currently in the action stage of change. As such, her goal is to continue weight management plan. She has agreed to keeping a food journal and adhering to recommended goals of 1600-1800 calories and 115 grams of protein and the Pescatarian Plan.  Behavioral Intervention  We discussed the following Behavioral Modification Strategies today: increasing lean protein intake, decreasing simple carbohydrates , increasing vegetables, increasing lower glycemic fruits, increasing fiber rich foods,  avoiding skipping meals, increasing water intake, work on tracking and journaling calories using tracking application, emotional eating strategies and understanding the difference between hunger signals and cravings, work on managing stress, creating time for  self-care and relaxation measures, continue to practice mindfulness when eating, planning for success, and staying on track while traveling and vacationing.  Additional resources provided today: NA  Recommended Physical Activity Goals  Megan Mitchell has been advised to work up to 150 minutes of moderate intensity aerobic activity a week and strengthening exercises 2-3 times per week for cardiovascular health, weight loss maintenance and preservation of muscle mass.   She has agreed to Continue current level of physical activity    Pharmacotherapy We discussed various medication options to help Megan Mitchell with her weight loss efforts and we both agreed to continue Lomaira 12 mg and Topiramate 75mg  daily.. I have consulted the [place] Controlled Substances Registry for this patient, and feel the risk/benefit ratio today is favorable for proceeding with this prescription for a controlled substance. No aberrancies noted.    Return in about 4 weeks (around 08/30/2023).Marland Kitchen She was informed of the importance of frequent follow up visits to maximize her success with intensive lifestyle modifications for her multiple health conditions.  PHYSICAL EXAM:  Blood pressure 105/69, pulse 78, temperature 98.4 F (36.9 C), height 5\' 8"  (1.727 m), weight 258 lb (117 kg), last menstrual period 06/14/2020, SpO2 99%. Body mass index is 39.23 kg/m.  General: She is overweight, cooperative, alert, well developed, and in no acute distress. PSYCH: Has normal mood, affect and thought process.   Cardiovascular: HR 70's BP 105/69. No peripheral edema. Lungs: Normal breathing effort, no conversational dyspnea. Neuro: no focal deficits  DIAGNOSTIC DATA REVIEWED:  BMET    Component Value Date/Time   NA 135 03/09/2023 1455   K 3.3 (L) 03/09/2023 1455   CL 96 03/09/2023 1455   CO2 21 03/09/2023 1455   GLUCOSE 74 03/09/2023 1455   GLUCOSE 110 (H) 05/11/2021 1258   BUN 12 03/09/2023 1455   CREATININE 0.74 03/09/2023 1455    CREATININE 0.74 04/22/2015 1840   CALCIUM 10.1 03/09/2023 1455   GFRNONAA 59 (L) 05/11/2021 1258   GFRAA 82 11/04/2020 1122   Lab Results  Component Value Date   HGBA1C 5.5 03/09/2023   HGBA1C 5.7 (H) 04/23/2018   Lab Results  Component Value Date   INSULIN 18.1 03/09/2023   INSULIN 28.8 (H) 02/17/2022   Lab Results  Component Value Date   TSH 1.580 02/03/2022   CBC    Component Value Date/Time   WBC 9.7 03/09/2023 1455   WBC 9.9 05/11/2021 1258   RBC 4.66 03/09/2023 1455   RBC 4.34 05/11/2021 1258   HGB 11.5 03/09/2023 1455   HCT 36.0 03/09/2023 1455   PLT 509 (H) 03/09/2023 1455   MCV 77 (L) 03/09/2023 1455   MCH 24.7 (L) 03/09/2023 1455   MCH 23.5 (L) 05/11/2021 1258   MCHC 31.9 03/09/2023 1455   MCHC 30.4 05/11/2021 1258   RDW 15.0 03/09/2023 1455   Iron Studies    Component Value Date/Time   IRON 49 03/09/2023 1455   TIBC 394 03/09/2023 1455   FERRITIN 75 03/09/2023 1455   IRONPCTSAT 12 (L) 03/09/2023 1455   Lipid Panel     Component Value Date/Time   CHOL 164 03/09/2023 1455   TRIG 51 03/09/2023 1455   HDL 71 03/09/2023 1455   CHOLHDL 3.2 02/17/2022 0902   LDLCALC 82 03/09/2023 1455   Hepatic Function Panel  Component Value Date/Time   PROT 7.8 03/09/2023 1455   ALBUMIN 4.6 03/09/2023 1455   AST 16 03/09/2023 1455   ALT 15 03/09/2023 1455   ALKPHOS 106 03/09/2023 1455   BILITOT 0.5 03/09/2023 1455   BILIDIR 0.11 08/21/2017 1803   IBILI NOT CALCULATED 01/30/2012 0945      Component Value Date/Time   TSH 1.580 02/03/2022 1624   Nutritional Lab Results  Component Value Date   VD25OH 62.7 03/09/2023   VD25OH 53.1 10/18/2022   VD25OH 30.7 02/17/2022    ASSOCIATED CONDITIONS ADDRESSED TODAY  ASSESSMENT AND PLAN  Problem List Items Addressed This Visit   None Visit Diagnoses     Essential hypertension    -  Primary   Prediabetes       Other disorder of eating       Relevant Medications   Phentermine HCl (LOMAIRA) 8 MG TABS    topiramate (TOPAMAX) 25 MG tablet   Obesity with starting BMI of 41.5       Relevant Medications   Phentermine HCl (LOMAIRA) 8 MG TABS     Obesity Patient is making progress with weight loss, currently at 258 lbs. She has a goal weight of 250 lbs, but is open to losing more to improve her adipose percentage and visceral adipose rating. She is adhering to a pescatarian diet and is satisfied with her current plan. She is also considering abdominoplasty for excess skin removal after reaching her weight loss goal. -Continue current diet and exercise regimen. -Continue/Refill Lomaira 12 mg and Topiramate 75mg  daily. -Consider increasing medication dosage if weight loss plateaus. -Consider referral to plastic surgery for abdominoplasty consultation once weight is stable for at least six months.  Hypertension Hypertension well controlled, asymptomatic, and no significant medication side effects noted.  Medication(s): Maxzide 37.5-25 mg once daily   Amlodipine 10 mg daily   Losartan 25 mg daily Renal function is normal. No side effects with medications.   BP Readings from Last 3 Encounters:  08/02/23 105/69  07/10/23 110/74  06/29/23 103/66   Lab Results  Component Value Date   CREATININE 0.74 03/09/2023   CREATININE 0.85 10/18/2022   CREATININE 0.77 02/17/2022   No results found for: "GFR"  Plan: Continue all antihypertensives at current dosages. Continue to work on nutrition plan to promote weight loss and improve BP control.   Eating disorder/emotional eating Megan Mitchell has had issues with stress/emotional eating. Currently this is moderately controlled. Overall mood is stable. Medication(s): Lomaira 12 mg and Topiramate 75mg  daily. No side effects.  Patient reports feeling satisfied with current diet and does not feel deprived. She has a strategy for "cheat days" and is able to get back on track. -Continue current management plan. Plan: Continue and refill Lomaira 12 mg and  Topiramate 75mg  daily.  - Phentermine HCl (LOMAIRA) 8 MG TABS; Take 1.5 tablets (12 mg total) by mouth daily.  Dispense: 45 tablet; Refill: 0 - topiramate (TOPAMAX) 25 MG tablet; Take 3 tablets (75 mg total) by mouth daily.  Dispense: 90 tablet; Refill: 0   Follow-up Next appointment scheduled with Dr. Lawson Radar in one month.  ATTESTASTION STATEMENTS:  Reviewed by clinician on day of visit: allergies, medications, problem list, medical history, surgical history, family history, social history, and previous encounter notes.   I have personally spent 30 minutes total time today in preparation, patient care, nutritional counseling and documentation for this visit, including the following: review of clinical lab tests; review of medical tests/procedures/services.  Jailani Hogans, PA-C

## 2023-08-03 ENCOUNTER — Other Ambulatory Visit (HOSPITAL_COMMUNITY): Payer: Self-pay

## 2023-08-09 ENCOUNTER — Other Ambulatory Visit (HOSPITAL_COMMUNITY): Payer: Self-pay

## 2023-08-09 ENCOUNTER — Encounter: Payer: Self-pay | Admitting: Internal Medicine

## 2023-08-09 ENCOUNTER — Ambulatory Visit: Payer: Commercial Managed Care - PPO | Admitting: Internal Medicine

## 2023-08-09 VITALS — BP 93/62 | HR 94 | Temp 97.8°F | Ht 68.0 in | Wt 260.8 lb

## 2023-08-09 DIAGNOSIS — H6123 Impacted cerumen, bilateral: Secondary | ICD-10-CM

## 2023-08-09 DIAGNOSIS — H66005 Acute suppurative otitis media without spontaneous rupture of ear drum, recurrent, left ear: Secondary | ICD-10-CM

## 2023-08-09 DIAGNOSIS — I959 Hypotension, unspecified: Secondary | ICD-10-CM

## 2023-08-09 DIAGNOSIS — B3731 Acute candidiasis of vulva and vagina: Secondary | ICD-10-CM

## 2023-08-09 MED ORDER — AMOXICILLIN-POT CLAVULANATE 875-125 MG PO TABS
1.0000 | ORAL_TABLET | Freq: Two times a day (BID) | ORAL | 0 refills | Status: DC
Start: 2023-08-09 — End: 2023-09-04
  Filled 2023-08-09: qty 20, 10d supply, fill #0

## 2023-08-09 MED ORDER — FLUCONAZOLE 150 MG PO TABS
ORAL_TABLET | ORAL | 1 refills | Status: DC
  Filled 2023-08-09: qty 2, 3d supply, fill #0

## 2023-08-09 MED ORDER — FLUTICASONE PROPIONATE 50 MCG/ACT NA SUSP
2.0000 | Freq: Every day | NASAL | 6 refills | Status: DC
  Filled 2023-08-09: qty 16, 30d supply, fill #0

## 2023-08-09 MED ORDER — PSEUDOEPHEDRINE HCL ER 120 MG PO TB12
120.0000 mg | ORAL_TABLET | Freq: Two times a day (BID) | ORAL | 0 refills | Status: DC
Start: 2023-08-09 — End: 2024-02-26
  Filled 2023-08-09: qty 20, 10d supply, fill #0

## 2023-08-09 NOTE — Progress Notes (Signed)
[facility]: [phone]   -- Medical Office Visit --  Patient:  Megan Mitchell      Age: 52 y.o.       Sex:  female  Date:   08/09/2023 Patient Care Team: Jarold Motto, [place] as PCP - General (Physician Assistant) Today's Healthcare Provider: Lula Olszewski, MD      Assessment & Plan Impacted cerumen, bilateral had hard crusty cerumen in both ears, more significantly in the right ear. Attempted irrigation was unsuccessful in the right ear but successful in the left. We attempted manual disimpaction in the right ear and advised letting soap and water enter the ears during showers to prevent future cerumen accumulation. Recurrent acute suppurative otitis media without spontaneous rupture of left tympanic membrane presents with a left ear infection characterized by pus behind the eardrum, contributing to sensations of something falling in the ear. We will start Augmentin for 7 days, perform sinus rinses, and use Flonase to aid in draining pus through the eustachian tube. A follow-up next week will ensure the resolution of the infection. Advised patient chronic infection(s) can cause mastoiditis and even get into the brain. Vaginal candidiasis  is starting on Augmentin and has a history of yeast infections with antibiotics. We will prescribe Diflucan, two tablets to be taken 72 hours apart.  Hypotension, unspecified hypotension type blood pressure readings are consistently in the 90s/50s, and they have been losing weight while on antihypertensive medications (Amlodipine and Triamterene-Hydrochlorothiazide). We will hold Triamterene-Hydrochlorothiazide for a week and monitor blood pressure at home, increasing salt intake with salty drinks or foods. Advised patient follow up with Primary Care Provider (PCP) 1 week  Diagnoses and all orders for this visit: Recurrent acute suppurative otitis media without spontaneous rupture of left tympanic membrane -      amoxicillin-clavulanate (AUGMENTIN) 875-125 MG tablet; Take 1 tablet by mouth 2 (two) times daily. -     pseudoephedrine (SUDAFED) 120 MG 12 hr tablet; Take 1 tablet (120 mg total) by mouth 2 (two) times daily. Vaginal candidiasis -     fluconazole (DIFLUCAN) 150 MG tablet; Take 1 tablet by mouth now, may repeat in 3 days as needed Impacted cerumen, bilateral Hypotension, unspecified hypotension type Other orders -     fluticasone (FLONASE) 50 MCG/ACT nasal spray; Place 2 sprays into both nostrils daily.  Recommended follow-up: Advised patient follow up with Primary Care Provider (PCP) 1 week  Future Appointments  Date Time Provider Department Center  09/04/2023  3:40 PM Langston Reusing, MD [facility] None    Medical Decision Making: 2 or more stable chronic illnesses Prescription drug management            Subjective   52 y.o. female who has HTN (hypertension); Severe obesity (BMI >= 40) (HCC); Anemia; Pelvic mass in female; Fibroids; Urinary tract infection; Pain in left knee; Upper respiratory tract infection; Pain in right knee; Osteoarthritis of knees, bilateral; Hyperlipidemia; Elevated coronary artery calcium score; Anxiety disorder; and Arthritic gait on their problem list. Her reasons/main concerns/chief complaints for today's office visit are Ear cleaning of both ears   ------------------------------------------------------------------------------------------------------------------------ AI-Extracted: Discussed the use of AI scribe software for clinical note transcription with the patient, who gave verbal consent to proceed.  History of Present Illness   The patient presented with a chief complaint of a sensation of something in their ears, which they described as feeling like something was falling. They also reported occasional ringing in their ears. The patient had previously sought medical  attention for this issue, with the last treatment occurring a couple of months  prior. Despite the treatment, the patient continued to experience the same symptoms, suggesting a possible recurrence of the initial condition.  In addition to the ear-related symptoms, the patient reported a history of knee problems, which had improved following injections. They had not required any orthopedic follow-up since the injections. The patient also mentioned a recent weight loss, which they had been actively pursuing.  The patient's blood pressure was noted to be low on two separate occasions, with readings in the 90s over 50s. They were taking three different blood pressure medications at the time, including amlodipine and triamterene hydrochlorothiazide. The patient expressed a willingness to adjust their medication regimen as needed based on their blood pressure readings.  The procedure was somewhat uncomfortable, particularly on the right side, and left the patient feeling a bit dizzy. Despite the discomfort, the patient reported feeling better after the procedure. The patient was familiar with the concept of eustachian tubes and understood the connection between allergies and ear infections.      She has a past medical history of Allergy, Anemia, Anxiety, Arthritis, Fibroids, Hypertension, and Ovarian cyst.  Problem list overviews that were updated at today's visit: No problems updated. Current Outpatient Medications on File Prior to Visit  Medication Sig   amLODipine (NORVASC) 10 MG tablet Take 0.5 tablets (5 mg total) by mouth daily.   atorvastatin (LIPITOR) 20 MG tablet Take 1 tablet (20 mg total) by mouth daily.   Biotin w/ Vitamins C & E (HAIR/SKIN/NAILS PO) Take by mouth.   fluticasone (FLONASE) 50 MCG/ACT nasal spray Place 2 sprays into both nostrils daily.   losartan (COZAAR) 25 MG tablet Take 1 tablet (25 mg total) by mouth daily.   Multiple Vitamin (MULTIVITAMIN ADULT PO) Take by mouth.   OVER THE COUNTER MEDICATION Memo 2 chews daily for hot flashes   Phentermine HCl  (LOMAIRA) 8 MG TABS Take 1.5 tablets (12 mg total) by mouth daily.   Probiotic Product (FORTIFY PROBIOTIC WOMENS PO) Take by mouth.   Saline (SIMPLY SALINE) 0.9 % AERS Place 2 sprays into the nose at bedtime. Spray each nostril nightly to rinse sinuses.  Additional unlimited sprays as needed nasal congestion.   sertraline (ZOLOFT) 100 MG tablet Take 1 tablet (100 mg total) by mouth daily.   topiramate (TOPAMAX) 25 MG tablet Take 3 tablets (75 mg total) by mouth daily.   triamterene-hydrochlorothiazide (MAXZIDE-25) 37.5-25 MG tablet Take 1 tablet by mouth daily.   No current facility-administered medications on file prior to visit.   Medications Discontinued During This Encounter  Medication Reason   fluconazole (DIFLUCAN) 150 MG tablet Reorder     Objective   Physical Exam  BP 93/62 (BP Location: Left Arm, Patient Position: Sitting)   Pulse 94   Temp 97.8 F (36.6 C) (Temporal)   Ht 5\' 8"  (1.727 m)   Wt 260 lb 12.8 oz (118.3 kg)   LMP 06/14/2020 (Approximate)   SpO2 97%   BMI 39.65 kg/m  Wt Readings from Last 10 Encounters:  08/09/23 260 lb 12.8 oz (118.3 kg)  08/02/23 258 lb (117 kg)  07/10/23 262 lb 9.6 oz (119.1 kg)  06/29/23 259 lb (117.5 kg)  06/07/23 263 lb (119.3 kg)  05/10/23 265 lb (120.2 kg)  04/10/23 265 lb (120.2 kg)  03/14/23 266 lb 6.1 oz (120.8 kg)  03/09/23 266 lb (120.7 kg)  03/06/23 270 lb (122.5 kg)   Vital signs reviewed.  Nursing notes reviewed. Weight trend reviewed. Abnormalities and Problem-Specific physical exam findings:  Physical Exam   VITALS: BP- 93/50, BP- 90/50 HEENT: Right ear with hard crusties not amenable to irrigation. Left ear with pus behind eardrum indicating infection. Both ears underwent irrigation with noted discomfort during the procedure on the right. Manual debridement attempted on the right with minimal success. Eardrum intact bilaterally post-procedure.       General Appearance:  No acute distress appreciable.   Well-groomed,  healthy-appearing female.  Well proportioned with no abnormal fat distribution.  Good muscle tone. Pulmonary:  Normal work of breathing at rest, no respiratory distress apparent. SpO2: 97 %  Musculoskeletal: All extremities are intact.  Neurological:  Awake, alert, oriented, and engaged.  No obvious focal neurological deficits or cognitive impairments.  Sensorium seems unclouded.   Speech is clear and coherent with logical content. Psychiatric:  Appropriate mood, pleasant and cooperative demeanor, thoughtful and engaged during the exam  Results   Procedure: Ear Irrigation Description: Irrigation performed bilaterally. Small particles removed from the left ear. Right ear irrigation failed to remove hard cerumen. Informed Consent: Risks of the procedure include tympanic membrane injury and hearing loss. Patient consented verbally.  Procedure: Manual Ear Wax Removal Description: Manual disinfection performed on the right ear. Hard cerumen removed with a curette. Tympanic membrane inspected and found clean. Informed Consent: Risks of the procedure include scratching the external auditory canal. Patient consented verbally.  DIAGNOSTIC Ear examination: Pus behind left tympanic membrane (08/09/2023)     Complications: discomfort, nausea, vertigo for a few minutes after procedure. No ear pain persistent and hearing remained intact.we were able to see white fluid level for purulent otitis media after irrigation of the left ear.     No results found for any visits on 08/09/23.  Office Visit on 03/09/2023  Component Date Value   Glucose 03/09/2023 74    BUN 03/09/2023 12    Creatinine, Ser 03/09/2023 0.74    eGFR 03/09/2023 97    BUN/Creatinine Ratio 03/09/2023 16    Sodium 03/09/2023 135    Potassium 03/09/2023 3.3 (L)    Chloride 03/09/2023 96    CO2 03/09/2023 21    Calcium 03/09/2023 10.1    Total Protein 03/09/2023 7.8    Albumin 03/09/2023 4.6    Globulin, Total 03/09/2023 3.2     Albumin/Globulin Ratio 03/09/2023 1.4    Bilirubin Total 03/09/2023 0.5    Alkaline Phosphatase 03/09/2023 106    AST 03/09/2023 16    ALT 03/09/2023 15    Hgb A1c MFr Bld 03/09/2023 5.5    Est. average glucose Bld* 03/09/2023 111    INSULIN 03/09/2023 18.1    Cholesterol, Total 03/09/2023 164    Triglycerides 03/09/2023 51    HDL 03/09/2023 71    VLDL Cholesterol Cal 03/09/2023 11    LDL Chol Calc (NIH) 03/09/2023 82    LDL/HDL Ratio 03/09/2023 1.2    Vit D, 25-Hydroxy 03/09/2023 62.7    Total Iron Binding Capac* 03/09/2023 394    UIBC 03/09/2023 345    Iron 03/09/2023 49    Iron Saturation 03/09/2023 12 (L)    Vitamin B-12 03/09/2023 948    Folate, Hemolysate 03/09/2023 338.0    Hematocrit 03/09/2023 36.0    Folate, RBC 03/09/2023 939    Ferritin 03/09/2023 75    Retic Ct Pct 03/09/2023 1.6    WBC 03/09/2023 9.7    RBC 03/09/2023 4.66    Hemoglobin 03/09/2023 11.5    MCV 03/09/2023  77 (L)    MCH 03/09/2023 24.7 (L)    MCHC 03/09/2023 31.9    RDW 03/09/2023 15.0    Platelets 03/09/2023 509 (H)   Office Visit on 10/18/2022  Component Date Value   Glucose 10/18/2022 76    BUN 10/18/2022 10    Creatinine, Ser 10/18/2022 0.85    eGFR 10/18/2022 82    BUN/Creatinine Ratio 10/18/2022 12    Sodium 10/18/2022 136    Potassium 10/18/2022 3.7    Chloride 10/18/2022 97    CO2 10/18/2022 22    Calcium 10/18/2022 10.3 (H)    Total Protein 10/18/2022 7.8    Albumin 10/18/2022 4.5    Globulin, Total 10/18/2022 3.3    Albumin/Globulin Ratio 10/18/2022 1.4    Bilirubin Total 10/18/2022 0.6    Alkaline Phosphatase 10/18/2022 106    AST 10/18/2022 21    ALT 10/18/2022 17    Hgb A1c MFr Bld 10/18/2022 5.6    Est. average glucose Bld* 10/18/2022 114    INSULIN 10/18/2022 23.5    Cholesterol, Total 10/18/2022 230 (H)    Triglycerides 10/18/2022 96    HDL 10/18/2022 67    VLDL Cholesterol Cal 10/18/2022 17    LDL Chol Calc (NIH) 10/18/2022 146 (H)    LDL/HDL Ratio 10/18/2022  2.2    Vit D, 25-Hydroxy 10/18/2022 53.1    Total Iron Binding Capac* 10/18/2022 467 (H)    UIBC 10/18/2022 416    Iron 10/18/2022 51    Iron Saturation 10/18/2022 11 (L)    Vitamin B-12 10/18/2022 1,431 (H)    Folate, Hemolysate 10/18/2022 380.0    Hematocrit 10/18/2022 39.1    Folate, RBC 10/18/2022 972    Ferritin 10/18/2022 48    Retic Ct Pct 10/18/2022 1.9    WBC 10/18/2022 7.1    RBC 10/18/2022 5.16    Hemoglobin 10/18/2022 12.1    MCV 10/18/2022 76 (L)    MCH 10/18/2022 23.4 (L)    MCHC 10/18/2022 30.9 (L)    RDW 10/18/2022 15.2    Platelets 10/18/2022 509 (H)    No image results found.   No results found.  MM 3D SCREENING MAMMOGRAM BILATERAL BREAST  Result Date: 01/30/2023 CLINICAL DATA:  Screening. EXAM: DIGITAL SCREENING BILATERAL MAMMOGRAM WITH TOMOSYNTHESIS AND CAD TECHNIQUE: Bilateral screening digital craniocaudal and mediolateral oblique mammograms were obtained. Bilateral screening digital breast tomosynthesis was performed. The images were evaluated with computer-aided detection. COMPARISON:  Previous exam(s). ACR Breast Density Category b: There are scattered areas of fibroglandular density. FINDINGS: There are no findings suspicious for malignancy. IMPRESSION: No mammographic evidence of malignancy. A result letter of this screening mammogram will be mailed directly to the patient. RECOMMENDATION: Screening mammogram in one year. (Code:SM-B-01Y) BI-RADS CATEGORY  1: Negative. Electronically Signed   By: Norva Pavlov M.D.   On: 01/30/2023 13:20       Additional Info: This encounter employed real-time, collaborative documentation. The patient actively reviewed and updated their medical record on a shared screen, ensuring transparency and facilitating joint problem-solving for the problem list, overview, and plan. This approach promotes accurate, informed care. The treatment plan was discussed and reviewed in detail, including medication safety, potential side  effects, and all patient questions. We confirmed understanding and comfort with the plan. Follow-up instructions were established, including contacting the office for any concerns, returning if symptoms worsen, persist, or new symptoms develop, and precautions for potential emergency department visits.

## 2023-08-09 NOTE — Patient Instructions (Signed)
VISIT SUMMARY:  During your visit, we discussed your concerns about the sensation of something falling in your ears and occasional ringing. We also talked about your history of knee problems, recent weight loss, and low blood pressure. We performed a procedure to address your ear issues, which was a bit uncomfortable but left you feeling better.  YOUR PLAN:  -EAR INFECTION: You have an infection in your left ear, which is causing the sensation of something falling. We will start you on a medication called Augmentin for 7 days, and you will also need to perform sinus rinses and use a nasal spray called Flonase. This will help drain the pus from your ear.  -EAR WAX BUILDUP: You have a buildup of hard, crusty ear wax in both ears, especially in the right ear. We tried to remove it but were only successful in the left ear in being able to do it with irrigation only. We recommend letting soap and water enter your ears during showers to prevent future buildup.  -LOW BLOOD PRESSURE: Your blood pressure readings have been consistently low, and you've been losing weight while on blood pressure medications. We will stop one of your medications, Triamterene-Hydrochlorothiazide, for a week and ask you to monitor your blood pressure at home. We also recommend increasing your salt intake.  -PREVENTION OF YEAST INFECTION: Since you're starting on Augmentin and have a history of yeast infections with antibiotics, we will prescribe Diflucan. This medication will help prevent a yeast infection.  INSTRUCTIONS:  Please monitor your blood pressure at home and increase your salt intake. Continue to let soap and water enter your ears during showers. Take your prescribed medications as directed, and return for a follow-up visit next week to ensure your ear infection has resolved.

## 2023-08-10 ENCOUNTER — Other Ambulatory Visit (HOSPITAL_COMMUNITY): Payer: Self-pay

## 2023-08-11 ENCOUNTER — Other Ambulatory Visit (HOSPITAL_COMMUNITY): Payer: Self-pay

## 2023-08-21 ENCOUNTER — Ambulatory Visit (INDEPENDENT_AMBULATORY_CARE_PROVIDER_SITE_OTHER): Payer: Commercial Managed Care - PPO | Admitting: Internal Medicine

## 2023-08-21 ENCOUNTER — Other Ambulatory Visit (HOSPITAL_COMMUNITY): Payer: Self-pay

## 2023-08-21 ENCOUNTER — Encounter: Payer: Self-pay | Admitting: Internal Medicine

## 2023-08-21 VITALS — BP 108/77 | HR 76 | Temp 97.7°F | Ht 68.0 in

## 2023-08-21 DIAGNOSIS — H6123 Impacted cerumen, bilateral: Secondary | ICD-10-CM

## 2023-08-21 MED ORDER — DEBROX 6.5 % OT SOLN
5.0000 [drp] | Freq: Two times a day (BID) | OTIC | 0 refills | Status: DC
Start: 2023-08-21 — End: 2024-02-26
  Filled 2023-08-21: qty 15, 30d supply, fill #0

## 2023-08-21 NOTE — Progress Notes (Signed)
[facility]: [phone]   -- Medical Office Visit --  Patient:  Megan Mitchell      Age: 52 y.o.       Sex:  female  Date:   08/21/2023 Patient Care Team: Jarold Motto, [place] as PCP - General (Physician Assistant) Today's Healthcare Provider: Lula Olszewski, MD  @[email or web address] @  Assessment   Plan      Assessment & Plan Impacted cerumen, bilateral  Assessment and Plan    Neck Pain   She reports mild anterior neck discomfort for one week, likely due to her sleeping position, without significant tenderness or limitation of range of motion. We recommend self-massage and suggest considering a change in pillow for better neck support.  Ear Infection   Her ear infection has resolved, leaving minor residual wax build-up without current discomfort, pressure, or significant ringing. We will start Debrox twice daily for one week to dissolve the residual wax and advise self-cleaning with a Q-tip after Debrox use.  Weight Management   She is on a low-dose phentermine and topiramate regimen for weight loss, reporting some weight loss and looser fitting clothes. We will continue the current regimen and follow up with the weight loss clinic.  Hypertension   She is on triamterene hydrochlorothiazide, with improved blood pressure, possibly related to weight loss. We will consider discontinuing triamterene hydrochlorothiazide in the future due to weight loss.  Follow-up   No immediate follow-up is needed for her ears unless symptoms recur. We suggest massage for neck discomfort, continuing weight loss efforts, and monitoring blood pressure.      Diagnoses and all orders for this visit: Impacted cerumen, bilateral -     carbamide peroxide (DEBROX) 6.5 % OTIC solution; Place 5 drops into both ears 2 (two) times daily.  Recommended follow-up: No follow-ups on file. Future Appointments  Date Time Provider Department Center  09/04/2023  3:40 PM Langston Reusing, MD  [facility] None        Subjective   52 y.o. female who has HTN (hypertension); Severe obesity (BMI >= 40) (HCC); Anemia; Pelvic mass in female; Fibroids; Urinary tract infection; Pain in left knee; Upper respiratory tract infection; Pain in right knee; Osteoarthritis of knees, bilateral; Hyperlipidemia; Elevated coronary artery calcium score; Anxiety disorder; and Arthritic gait on their problem list.. Their main reasons/main concerns/chief complaints for today's office visit are 2 week follow-up   ------------------------------------------------------------------------------------------------------------------------ AI-Extracted: Discussed the use of AI scribe software for clinical note transcription with the patient, who gave verbal consent to proceed.  History of Present Illness   The patient, with a history of hypertension and recent ear infection, presents with a chief complaint of neck stiffness. The patient reports that the neck stiffness has been ongoing for approximately one week, with no specific inciting event recalled. The discomfort is localized to the mid-anterior neck, with no radiation of pain reported. The patient denies any associated symptoms such as headache, fever, or difficulty swallowing.  The patient has been managing hypertension with triamterene hydrochlorothiazide, which she continues to take despite recent weight loss. The patient has been on a weight loss regimen involving phentermine and topiramate, which has resulted in a 10-pound weight loss over the past five months. The patient reports that her clothes are fitting more loosely, suggesting a reduction in body size.  Regarding the recent ear infection, the patient was treated with antibiotics and reports significant improvement. The patient notes that she still experiences some ringing in the ears, but denies any pain,  pressure, or discharge. The patient has been using Flonase for associated nasal symptoms and plans to  continue its use.  The patient also reports a history of wax build-up in the ears, which has been managed with regular cleaning. However, the patient notes that small pieces of wax remain stuck in the ear canals.  The patient's sleep habits were also discussed, as the patient typically sleeps with multiple pillows and suspects this may be contributing to the neck stiffness. The patient has not yet sought any specific treatment for the neck stiffness, but is considering massage therapy for relief. The patient also has muscle relaxants at home, which she may use for symptom management.       Note that patient  has a past medical history of Allergy, Anemia, Anxiety, Arthritis, Fibroids, Hypertension, and Ovarian cyst.  Problem list overviews that were updated at today's visit: No problems updated. Current Outpatient Medications on File Prior to Visit  Medication Sig   amLODipine (NORVASC) 10 MG tablet Take 1 tablet (10 mg total) by mouth daily.   amLODipine (NORVASC) 10 MG tablet Take 0.5 tablets (5 mg total) by mouth daily.   atorvastatin (LIPITOR) 20 MG tablet Take 1 tablet (20 mg total) by mouth daily.   Biotin w/ Vitamins C & E (HAIR/SKIN/NAILS PO) Take by mouth.   fluconazole (DIFLUCAN) 150 MG tablet Take 1 tablet by mouth now, may repeat in 3 days as needed   fluticasone (FLONASE) 50 MCG/ACT nasal spray Place 2 sprays into both nostrils daily.   fluticasone (FLONASE) 50 MCG/ACT nasal spray Place 2 sprays into both nostrils daily.   losartan (COZAAR) 25 MG tablet Take 1 tablet (25 mg total) by mouth daily.   Multiple Vitamin (MULTIVITAMIN ADULT PO) Take by mouth.   OVER THE COUNTER MEDICATION Memo 2 chews daily for hot flashes   Phentermine HCl (LOMAIRA) 8 MG TABS Take 1.5 tablets (12 mg total) by mouth daily.   Probiotic Product (FORTIFY PROBIOTIC WOMENS PO) Take by mouth.   pseudoephedrine (SUDAFED) 120 MG 12 hr tablet Take 1 tablet (120 mg total) by mouth 2 (two) times daily.   Saline  (SIMPLY SALINE) 0.9 % AERS Place 2 sprays into the nose at bedtime. Spray each nostril nightly to rinse sinuses.  Additional unlimited sprays as needed nasal congestion.   sertraline (ZOLOFT) 100 MG tablet Take 1 tablet (100 mg total) by mouth daily.   topiramate (TOPAMAX) 25 MG tablet Take 3 tablets (75 mg total) by mouth daily.   triamterene-hydrochlorothiazide (MAXZIDE-25) 37.5-25 MG tablet Take 1 tablet by mouth daily.   amoxicillin-clavulanate (AUGMENTIN) 875-125 MG tablet Take 1 tablet by mouth 2 (two) times daily. (Patient not taking: Reported on 08/21/2023)   No current facility-administered medications on file prior to visit.  There are no discontinued medications.   Objective   Physical Exam  BP 108/77 (BP Location: Left Arm, Patient Position: Sitting)   Pulse 76   Temp 97.7 F (36.5 C) (Temporal)   Ht 5\' 8"  (1.727 m)   LMP 06/14/2020 (Approximate)   SpO2 100%   BMI 39.65 kg/m  Wt Readings from Last 10 Encounters:  08/09/23 260 lb 12.8 oz (118.3 kg)  08/02/23 258 lb (117 kg)  07/10/23 262 lb 9.6 oz (119.1 kg)  06/29/23 259 lb (117.5 kg)  06/07/23 263 lb (119.3 kg)  05/10/23 265 lb (120.2 kg)  04/10/23 265 lb (120.2 kg)  03/14/23 266 lb 6.1 oz (120.8 kg)  03/09/23 266 lb (120.7 kg)  03/06/23 270  lb (122.5 kg)   Vital signs reviewed.  Nursing notes reviewed. Weight trend reviewed. Abnormalities and Problem-Specific physical exam findings:  Physical Exam   MEASUREMENTS: Weight down ten pounds from May. HEENT: Ear canals exhibit tiny specks of stuck on wax, absence of tumors or polyps, improved clarity compared to previous examination. NECK: Mild discomfort in mid anterior neck on palpation, absence of tenderness on contralateral side, muscle in state of constant spasm noted on palpation, not correlating with primary area of complaint.       General Appearance:  No acute distress appreciable.   Well-groomed, healthy-appearing female.  Well proportioned with no abnormal  fat distribution.  Good muscle tone. Pulmonary:  Normal work of breathing at rest, no respiratory distress apparent. SpO2: 100 %  Musculoskeletal: All extremities are intact.  Neurological:  Awake, alert, oriented, and engaged.  No obvious focal neurological deficits or cognitive impairments.  Sensorium seems unclouded.   Speech is clear and coherent with logical content. Psychiatric:  Appropriate mood, pleasant and cooperative demeanor, thoughtful and engaged during the exam  Results   DIAGNOSTIC Ear examination: No tumors or polyps, minimal cerumen, no purulent discharge, improved clarity compared to the previous examination on 08/21/2023.        No results found for any visits on 08/21/23.  Office Visit on 03/09/2023  Component Date Value   Glucose 03/09/2023 74    BUN 03/09/2023 12    Creatinine, Ser 03/09/2023 0.74    eGFR 03/09/2023 97    BUN/Creatinine Ratio 03/09/2023 16    Sodium 03/09/2023 135    Potassium 03/09/2023 3.3 (L)    Chloride 03/09/2023 96    CO2 03/09/2023 21    Calcium 03/09/2023 10.1    Total Protein 03/09/2023 7.8    Albumin 03/09/2023 4.6    Globulin, Total 03/09/2023 3.2    Albumin/Globulin Ratio 03/09/2023 1.4    Bilirubin Total 03/09/2023 0.5    Alkaline Phosphatase 03/09/2023 106    AST 03/09/2023 16    ALT 03/09/2023 15    Hgb A1c MFr Bld 03/09/2023 5.5    Est. average glucose Bld* 03/09/2023 111    INSULIN 03/09/2023 18.1    Cholesterol, Total 03/09/2023 164    Triglycerides 03/09/2023 51    HDL 03/09/2023 71    VLDL Cholesterol Cal 03/09/2023 11    LDL Chol Calc (NIH) 03/09/2023 82    LDL/HDL Ratio 03/09/2023 1.2    Vit D, 25-Hydroxy 03/09/2023 62.7    Total Iron Binding Capac* 03/09/2023 394    UIBC 03/09/2023 345    Iron 03/09/2023 49    Iron Saturation 03/09/2023 12 (L)    Vitamin B-12 03/09/2023 948    Folate, Hemolysate 03/09/2023 338.0    Hematocrit 03/09/2023 36.0    Folate, RBC 03/09/2023 939    Ferritin 03/09/2023 75     Retic Ct Pct 03/09/2023 1.6    WBC 03/09/2023 9.7    RBC 03/09/2023 4.66    Hemoglobin 03/09/2023 11.5    MCV 03/09/2023 77 (L)    MCH 03/09/2023 24.7 (L)    MCHC 03/09/2023 31.9    RDW 03/09/2023 15.0    Platelets 03/09/2023 509 (H)   Office Visit on 10/18/2022  Component Date Value   Glucose 10/18/2022 76    BUN 10/18/2022 10    Creatinine, Ser 10/18/2022 0.85    eGFR 10/18/2022 82    BUN/Creatinine Ratio 10/18/2022 12    Sodium 10/18/2022 136    Potassium 10/18/2022 3.7    Chloride  10/18/2022 97    CO2 10/18/2022 22    Calcium 10/18/2022 10.3 (H)    Total Protein 10/18/2022 7.8    Albumin 10/18/2022 4.5    Globulin, Total 10/18/2022 3.3    Albumin/Globulin Ratio 10/18/2022 1.4    Bilirubin Total 10/18/2022 0.6    Alkaline Phosphatase 10/18/2022 106    AST 10/18/2022 21    ALT 10/18/2022 17    Hgb A1c MFr Bld 10/18/2022 5.6    Est. average glucose Bld* 10/18/2022 114    INSULIN 10/18/2022 23.5    Cholesterol, Total 10/18/2022 230 (H)    Triglycerides 10/18/2022 96    HDL 10/18/2022 67    VLDL Cholesterol Cal 10/18/2022 17    LDL Chol Calc (NIH) 10/18/2022 146 (H)    LDL/HDL Ratio 10/18/2022 2.2    Vit D, 25-Hydroxy 10/18/2022 53.1    Total Iron Binding Capac* 10/18/2022 467 (H)    UIBC 10/18/2022 416    Iron 10/18/2022 51    Iron Saturation 10/18/2022 11 (L)    Vitamin B-12 10/18/2022 1,431 (H)    Folate, Hemolysate 10/18/2022 380.0    Hematocrit 10/18/2022 39.1    Folate, RBC 10/18/2022 972    Ferritin 10/18/2022 48    Retic Ct Pct 10/18/2022 1.9    WBC 10/18/2022 7.1    RBC 10/18/2022 5.16    Hemoglobin 10/18/2022 12.1    MCV 10/18/2022 76 (L)    MCH 10/18/2022 23.4 (L)    MCHC 10/18/2022 30.9 (L)    RDW 10/18/2022 15.2    Platelets 10/18/2022 509 (H)    No image results found.   No results found.  MM 3D SCREENING MAMMOGRAM BILATERAL BREAST  Result Date: 01/30/2023 CLINICAL DATA:  Screening. EXAM: DIGITAL SCREENING BILATERAL MAMMOGRAM WITH  TOMOSYNTHESIS AND CAD TECHNIQUE: Bilateral screening digital craniocaudal and mediolateral oblique mammograms were obtained. Bilateral screening digital breast tomosynthesis was performed. The images were evaluated with computer-aided detection. COMPARISON:  Previous exam(s). ACR Breast Density Category b: There are scattered areas of fibroglandular density. FINDINGS: There are no findings suspicious for malignancy. IMPRESSION: No mammographic evidence of malignancy. A result letter of this screening mammogram will be mailed directly to the patient. RECOMMENDATION: Screening mammogram in one year. (Code:SM-B-01Y) BI-RADS CATEGORY  1: Negative. Electronically Signed   By: Norva Pavlov M.D.   On: 01/30/2023 13:20

## 2023-08-21 NOTE — Patient Instructions (Signed)
VISIT SUMMARY:  During your visit, we discussed your neck stiffness, recent ear infection, weight management, and hypertension. Your neck discomfort seems to be related to your sleeping position, and your ear infection has improved, leaving minor residual wax. Your weight loss regimen appears to be effective, and your hypertension is well-managed, possibly due to your weight loss.  YOUR PLAN:  -NECK PAIN: Your neck discomfort is likely due to your sleeping position. We recommend self-massage and suggest considering a change in pillow for better neck support.  -EAR INFECTION: Your ear infection has improved, but there is minor residual wax. We recommend using Debrox twice daily for one week to dissolve the wax, and then cleaning your ears with a Q-tip.  -WEIGHT MANAGEMENT: Your weight loss regimen with low-dose phentermine and topiramate is working well, as evidenced by your weight loss and looser fitting clothes. We will continue this regimen and follow up with the weight loss clinic. We discussed compounded tirzepatide online.  -HYPERTENSION: Your hypertension is well-managed with triamterene hydrochlorothiazide, and your blood pressure has improved, possibly due to your weight loss. We may consider discontinuing this medication in the future due to your weight loss.  INSTRUCTIONS:  No immediate follow-up is needed for your ears unless symptoms recur. We suggest massage for neck discomfort, continuing weight loss efforts, and monitoring blood pressure.

## 2023-08-22 ENCOUNTER — Other Ambulatory Visit (HOSPITAL_COMMUNITY): Payer: Self-pay

## 2023-08-23 DIAGNOSIS — F411 Generalized anxiety disorder: Secondary | ICD-10-CM | POA: Diagnosis not present

## 2023-08-24 ENCOUNTER — Other Ambulatory Visit (HOSPITAL_COMMUNITY): Payer: Self-pay

## 2023-09-04 ENCOUNTER — Ambulatory Visit (INDEPENDENT_AMBULATORY_CARE_PROVIDER_SITE_OTHER): Payer: Commercial Managed Care - PPO | Admitting: Internal Medicine

## 2023-09-04 ENCOUNTER — Other Ambulatory Visit (HOSPITAL_COMMUNITY): Payer: Self-pay

## 2023-09-04 ENCOUNTER — Encounter (INDEPENDENT_AMBULATORY_CARE_PROVIDER_SITE_OTHER): Payer: Self-pay

## 2023-09-04 ENCOUNTER — Encounter (INDEPENDENT_AMBULATORY_CARE_PROVIDER_SITE_OTHER): Payer: Self-pay | Admitting: Internal Medicine

## 2023-09-04 VITALS — BP 110/73 | HR 84 | Temp 97.8°F | Ht 68.0 in | Wt 259.0 lb

## 2023-09-04 DIAGNOSIS — E66813 Obesity, class 3: Secondary | ICD-10-CM

## 2023-09-04 DIAGNOSIS — F5089 Other specified eating disorder: Secondary | ICD-10-CM | POA: Diagnosis not present

## 2023-09-04 DIAGNOSIS — I10 Essential (primary) hypertension: Secondary | ICD-10-CM

## 2023-09-04 DIAGNOSIS — Z6841 Body Mass Index (BMI) 40.0 and over, adult: Secondary | ICD-10-CM

## 2023-09-04 MED ORDER — PHENTERMINE HCL 37.5 MG PO TABS
18.7500 mg | ORAL_TABLET | Freq: Every day | ORAL | 0 refills | Status: DC
Start: 2023-09-04 — End: 2023-10-05
  Filled 2023-09-04: qty 15, 30d supply, fill #0

## 2023-09-04 MED ORDER — TOPIRAMATE 25 MG PO TABS
75.0000 mg | ORAL_TABLET | Freq: Every day | ORAL | 0 refills | Status: DC
  Filled 2023-09-04: qty 90, 30d supply, fill #0

## 2023-09-04 NOTE — Progress Notes (Unsigned)
Office: [phone]  /  Fax: [phone]  WEIGHT SUMMARY AND BIOMETRICS  Vitals Temp: 97.8 F (36.6 C) BP: 110/73 Pulse Rate: 84 SpO2: 100 %   Anthropometric Measurements Height: 5\' 8"  (1.727 m) Weight: 259 lb (117.5 kg) BMI (Calculated): 39.39 Weight at Last Visit: 258 lb Weight Lost Since Last Visit: 0 Weight Gained Since Last Visit: 1 Starting Weight: 273 lb Total Weight Loss (lbs): 14 lb (6.35 kg) Peak Weight: 286 lb   Body Composition  Body Fat %: 50.5 % Fat Mass (lbs): 130.8 lbs Muscle Mass (lbs): 121.8 lbs Total Body Water (lbs): 97 lbs Visceral Fat Rating : 15    No data recorded Today's Visit #: 21  Starting Date: 02/17/22   HPI  Chief Complaint: OBESITY  Megan Mitchell is here to discuss her progress with her obesity treatment plan. She is on the keeping a food journal and adhering to recommended goals of 1600-1800 calories and 115 grams of protein and states she is following her eating plan approximately 85 to 90 % of the time. She states she is exercising 60 minutes 4 times per week.  Interval History:  Since last office visit she has {emweight change:30888}. She reports {EMADHERENCE:28838::"good adherence to reduced calorie nutritional plan."} She has been working on [employer] labels","not skipping meals","increasing protein intake at every meal","drinking more water","making healthier choices","reducing portion sizes","incorporating more whole foods"}  Orexigenic Control: {ACTIONS;DENIES/REPORTS:21021675::"Denies"} problems with appetite and hunger signals.  {ACTIONS;DENIES/REPORTS:21021675::"Denies"} problems with satiety and satiation.  {ACTIONS;DENIES/REPORTS:21021675::"Denies"} problems with eating patterns and portion control.  {ACTIONS;DENIES/REPORTS:21021675::"Denies"} abnormal cravings. {ACTIONS;DENIES/REPORTS:21021675::"Denies"} feeling deprived or restricted.   Barriers identified: {EMOBESITYBARRIERS:28841::"none"}.    Pharmacotherapy for weight loss: She is currently taking {EMPharmaco:28845}.    ASSESSMENT AND PLAN  TREATMENT PLAN FOR OBESITY:  Recommended Dietary Goals  Megan Mitchell is currently in the action stage of change. As such, her goal is to continue weight management plan. She has agreed to: {EMWTLOSSPLAN:29297::"continue current plan"}  Behavioral Intervention  We discussed the following Behavioral Modification Strategies today: {EMWMwtlossstrategies:28914::"continue to work on maintaining a reduced calorie state, getting the recommended amount of protein, incorporating whole foods, making healthy choices, staying well hydrated and practicing mindfulness when eating."}.  Additional resources provided today: {EMadditionalresources:29169::"None"}  Recommended Physical Activity Goals  Megan Mitchell has been advised to work up to 150 minutes of moderate intensity aerobic activity a week and strengthening exercises 2-3 times per week for cardiovascular health, weight loss maintenance and preservation of muscle mass.   She has agreed to :  {EMEXERCISE:28847::"Think about enjoyable ways to increase daily physical activity and overcoming barriers to exercise","Increase physical activity in their day and reduce sedentary time (increase NEAT)."}  Pharmacotherapy We discussed various medication options to help Megan Mitchell with her weight loss efforts and we both agreed to : {EMagreedrx:29170::"continue with nutritional and behavioral strategies"}  ASSOCIATED CONDITIONS ADDRESSED TODAY  There are no diagnoses linked to this encounter.  PHYSICAL EXAM:  Blood pressure 110/73, pulse 84, temperature 97.8 F (36.6 C), height 5\' 8"  (1.727 m), weight 259 lb (117.5 kg), last menstrual period 06/14/2020, SpO2 100%. Body mass index is 39.38 kg/m.  General: She is overweight, cooperative, alert, well developed, and in no acute distress. PSYCH: Has normal mood, affect and thought process.   HEENT: EOMI, sclerae are  anicteric. Lungs: Normal breathing effort, no conversational dyspnea. Extremities: No edema.  Neurologic: No gross sensory or motor deficits. No tremors or fasciculations noted.    DIAGNOSTIC DATA REVIEWED:  BMET    Component Value Date/Time   NA 135  03/09/2023 1455   K 3.3 (L) 03/09/2023 1455   CL 96 03/09/2023 1455   CO2 21 03/09/2023 1455   GLUCOSE 74 03/09/2023 1455   GLUCOSE 110 (H) 05/11/2021 1258   BUN 12 03/09/2023 1455   CREATININE 0.74 03/09/2023 1455   CREATININE 0.74 04/22/2015 1840   CALCIUM 10.1 03/09/2023 1455   GFRNONAA 59 (L) 05/11/2021 1258   GFRAA 82 11/04/2020 1122   Lab Results  Component Value Date   HGBA1C 5.5 03/09/2023   HGBA1C 5.7 (H) 04/23/2018   Lab Results  Component Value Date   INSULIN 18.1 03/09/2023   INSULIN 28.8 (H) 02/17/2022   Lab Results  Component Value Date   TSH 1.580 02/03/2022   CBC    Component Value Date/Time   WBC 9.7 03/09/2023 1455   WBC 9.9 05/11/2021 1258   RBC 4.66 03/09/2023 1455   RBC 4.34 05/11/2021 1258   HGB 11.5 03/09/2023 1455   HCT 36.0 03/09/2023 1455   PLT 509 (H) 03/09/2023 1455   MCV 77 (L) 03/09/2023 1455   MCH 24.7 (L) 03/09/2023 1455   MCH 23.5 (L) 05/11/2021 1258   MCHC 31.9 03/09/2023 1455   MCHC 30.4 05/11/2021 1258   RDW 15.0 03/09/2023 1455   Iron Studies    Component Value Date/Time   IRON 49 03/09/2023 1455   TIBC 394 03/09/2023 1455   FERRITIN 75 03/09/2023 1455   IRONPCTSAT 12 (L) 03/09/2023 1455   Lipid Panel     Component Value Date/Time   CHOL 164 03/09/2023 1455   TRIG 51 03/09/2023 1455   HDL 71 03/09/2023 1455   CHOLHDL 3.2 02/17/2022 0902   LDLCALC 82 03/09/2023 1455   Hepatic Function Panel     Component Value Date/Time   PROT 7.8 03/09/2023 1455   ALBUMIN 4.6 03/09/2023 1455   AST 16 03/09/2023 1455   ALT 15 03/09/2023 1455   ALKPHOS 106 03/09/2023 1455   BILITOT 0.5 03/09/2023 1455   BILIDIR 0.11 08/21/2017 1803   IBILI NOT CALCULATED 01/30/2012 0945       Component Value Date/Time   TSH 1.580 02/03/2022 1624   Nutritional Lab Results  Component Value Date   VD25OH 62.7 03/09/2023   VD25OH 53.1 10/18/2022   VD25OH 30.7 02/17/2022     No follow-ups on file.Marland Kitchen She was informed of the importance of frequent follow up visits to maximize her success with intensive lifestyle modifications for her multiple health conditions.   ATTESTASTION STATEMENTS:  Reviewed by clinician on day of visit: allergies, medications, problem list, medical history, surgical history, family history, social history, and previous encounter notes.     Worthy Rancher, MD

## 2023-09-05 DIAGNOSIS — F509 Eating disorder, unspecified: Secondary | ICD-10-CM | POA: Insufficient documentation

## 2023-09-05 NOTE — Assessment & Plan Note (Signed)
 See obesity treatment plan

## 2023-09-05 NOTE — Assessment & Plan Note (Signed)
Blood pressure is well-controlled.  She is currently on amlodipine, losartan and triamterene hydrochlorothiazide.  She is on phentermine for weight management but has not been affecting heart rate or blood pressure.  She will continue on current regimen monitor for orthostasis while losing weight.

## 2023-09-05 NOTE — Assessment & Plan Note (Signed)
She has increased orexigenic signaling, impaired satiety and inhibitory control. This is secondary to an abnormal energy regulation system and pathological neurohormonal pathways characteristic of excess adiposity.  In addition to nutritional and behavioral strategies she benefits from ongoing pharmacotherapy.

## 2023-09-06 ENCOUNTER — Other Ambulatory Visit (HOSPITAL_COMMUNITY): Payer: Self-pay

## 2023-09-13 DIAGNOSIS — F411 Generalized anxiety disorder: Secondary | ICD-10-CM | POA: Diagnosis not present

## 2023-09-25 ENCOUNTER — Other Ambulatory Visit: Payer: Self-pay | Admitting: Physician Assistant

## 2023-09-25 DIAGNOSIS — Z1231 Encounter for screening mammogram for malignant neoplasm of breast: Secondary | ICD-10-CM

## 2023-10-04 DIAGNOSIS — F411 Generalized anxiety disorder: Secondary | ICD-10-CM | POA: Diagnosis not present

## 2023-10-05 ENCOUNTER — Ambulatory Visit (INDEPENDENT_AMBULATORY_CARE_PROVIDER_SITE_OTHER): Payer: Commercial Managed Care - PPO | Admitting: Family Medicine

## 2023-10-05 ENCOUNTER — Other Ambulatory Visit (HOSPITAL_COMMUNITY): Payer: Self-pay

## 2023-10-05 VITALS — BP 113/72 | HR 75 | Temp 98.1°F | Ht 68.0 in | Wt 257.0 lb

## 2023-10-05 DIAGNOSIS — F5089 Other specified eating disorder: Secondary | ICD-10-CM

## 2023-10-05 DIAGNOSIS — Z6841 Body Mass Index (BMI) 40.0 and over, adult: Secondary | ICD-10-CM

## 2023-10-05 DIAGNOSIS — E66813 Obesity, class 3: Secondary | ICD-10-CM | POA: Diagnosis not present

## 2023-10-05 DIAGNOSIS — I10 Essential (primary) hypertension: Secondary | ICD-10-CM | POA: Diagnosis not present

## 2023-10-05 MED ORDER — TOPIRAMATE 100 MG PO TABS
100.0000 mg | ORAL_TABLET | Freq: Every day | ORAL | 0 refills | Status: DC
  Filled 2023-10-05: qty 30, 30d supply, fill #0

## 2023-10-05 MED ORDER — PHENTERMINE HCL 37.5 MG PO TABS
18.7500 mg | ORAL_TABLET | Freq: Every day | ORAL | 0 refills | Status: DC
Start: 2023-10-05 — End: 2023-11-02
  Filled 2023-10-05: qty 15, 30d supply, fill #0

## 2023-10-05 MED ORDER — TRIAMTERENE-HCTZ 37.5-25 MG PO TABS
1.0000 | ORAL_TABLET | Freq: Every day | ORAL | 0 refills | Status: DC
  Filled 2023-10-05 – 2023-10-27 (×2): qty 90, 90d supply, fill #0

## 2023-10-05 MED ORDER — LOSARTAN POTASSIUM 25 MG PO TABS
25.0000 mg | ORAL_TABLET | Freq: Every day | ORAL | 1 refills | Status: DC
  Filled 2023-10-05: qty 90, 90d supply, fill #0

## 2023-10-05 NOTE — Assessment & Plan Note (Signed)
Patient BP low at PCP a couple of weeks ago.  She is on norvasc, cozaar, maxzide.  She needs refills of maxzide and cozaar.  Follow up on BP at next appointment- if low will decrease losartan to half dose.

## 2023-10-05 NOTE — Progress Notes (Signed)
SUBJECTIVE:  Chief Complaint: Obesity  Interim History: Patient here for follow up.  She is experiencing significantly more sugar cravings than she was previously.  She was increased on her phentermine at last appointment.  She is wondering about increasing her topiramate.  She had a good Thanksgiving and went to the beach the day after and enjoyed herself.  For the next month she is planning to get thru Christmas with the grandchildren. She wants to plan a cruise somewhere.  Megan Mitchell is here to discuss her progress with her obesity treatment plan. She is on the keeping a food journal and adhering to recommended goals of 1500 calories and 30-40 grams of protein per meal and states she is following her eating plan approximately 100 % of the time. She states she is exercising 60 minutes 4 times per week.   OBJECTIVE: Visit Diagnoses: Problem List Items Addressed This Visit       Cardiovascular and Mediastinum   HTN (hypertension) - Primary    Patient BP low at PCP a couple of weeks ago.  She is on norvasc, cozaar, maxzide.  She needs refills of maxzide and cozaar.  Follow up on BP at next appointment- if low will decrease losartan to half dose.      Relevant Medications   triamterene-hydrochlorothiazide (MAXZIDE-25) 37.5-25 MG tablet   losartan (COZAAR) 25 MG tablet     Other   Eating disorder    Patient recently had increase in her phentermine at last appointment up to 18.75.  She has been journaling consistently. She is getting around 1500 calories a day now and changed the protein to 30-40g per meal.  Starting weight was 265lbs and she is now at 257.  Will increase topiramate to 100mg  a day and continue her phentermine at same dose.  PDMP checked and no concerns noted.      Relevant Medications   topiramate (TOPAMAX) 100 MG tablet   phentermine (ADIPEX-P) 37.5 MG tablet   Class 3 severe obesity with serious comorbidity and body mass index (BMI) of 40.0 to 44.9 in adult [facility])    Relevant Medications   phentermine (ADIPEX-P) 37.5 MG tablet   Other Visit Diagnoses     Essential hypertension       Relevant Medications   triamterene-hydrochlorothiazide (MAXZIDE-25) 37.5-25 MG tablet   losartan (COZAAR) 25 MG tablet       Vitals Temp: 98.1 F (36.7 C) BP: 113/72 Pulse Rate: 75 SpO2: 100 %   Anthropometric Measurements Height: 5\' 8"  (1.727 m) Weight: 257 lb (116.6 kg) BMI (Calculated): 39.09 Weight at Last Visit: 259 lb Weight Lost Since Last Visit: 2 Weight Gained Since Last Visit: 0 Starting Weight: 273 lb Total Weight Loss (lbs): 16 lb (7.258 kg)   Body Composition  Body Fat %: 49.9 % Fat Mass (lbs): 128.4 lbs Muscle Mass (lbs): 122.4 lbs Total Body Water (lbs): 95.2 lbs Visceral Fat Rating : 15   Other Clinical Data Today's Visit #: 22 Starting Date: 02/17/22     ASSESSMENT AND PLAN:  Diet: Megan Mitchell is currently in the action stage of change. As such, her goal is to continue with weight loss efforts. She has agreed to keeping a food journal and adhering to recommended goals of 1500 calories and 30-40 grams of protein per meal.  Exercise: Megan Mitchell has been instructed that some exercise is better than none for weight loss and overall health benefits.  She is going to continue with her walking and weight lifting and using her  resistance bands.   Behavior Modification:  We discussed the following Behavioral Modification Strategies today: increasing lean protein intake, increasing vegetables, meal planning and cooking strategies, better snacking choices, and keep a strict food journal. We discussed various medication options to help Megan Mitchell with her weight loss efforts and we both agreed to increase her topiramate up to 100mg  to match her higher dose oh phentermine.  No follow-ups on file.Marland Kitchen She was informed of the importance of frequent follow up visits to maximize her success with intensive lifestyle modifications for her multiple health  conditions.  Attestation Statements:   Reviewed by clinician on day of visit: allergies, medications, problem list, medical history, surgical history, family history, social history, and previous encounter notes.   Reuben Likes, MD

## 2023-10-05 NOTE — Assessment & Plan Note (Signed)
Patient recently had increase in her phentermine at last appointment up to 18.75.  She has been journaling consistently. She is getting around 1500 calories a day now and changed the protein to 30-40g per meal.  Starting weight was 265lbs and she is now at 257.  Will increase topiramate to 100mg  a day and continue her phentermine at same dose.  PDMP checked and no concerns noted.

## 2023-10-06 ENCOUNTER — Other Ambulatory Visit (HOSPITAL_COMMUNITY): Payer: Self-pay

## 2023-10-18 DIAGNOSIS — F411 Generalized anxiety disorder: Secondary | ICD-10-CM | POA: Diagnosis not present

## 2023-10-27 ENCOUNTER — Other Ambulatory Visit (INDEPENDENT_AMBULATORY_CARE_PROVIDER_SITE_OTHER): Payer: Self-pay | Admitting: Family Medicine

## 2023-10-27 ENCOUNTER — Other Ambulatory Visit (HOSPITAL_COMMUNITY): Payer: Self-pay

## 2023-10-27 DIAGNOSIS — F5089 Other specified eating disorder: Secondary | ICD-10-CM

## 2023-10-30 ENCOUNTER — Other Ambulatory Visit (HOSPITAL_COMMUNITY): Payer: Self-pay

## 2023-10-31 ENCOUNTER — Other Ambulatory Visit (HOSPITAL_COMMUNITY): Payer: Self-pay

## 2023-11-02 ENCOUNTER — Other Ambulatory Visit (HOSPITAL_BASED_OUTPATIENT_CLINIC_OR_DEPARTMENT_OTHER): Payer: Self-pay

## 2023-11-02 ENCOUNTER — Encounter (INDEPENDENT_AMBULATORY_CARE_PROVIDER_SITE_OTHER): Payer: Self-pay | Admitting: Family Medicine

## 2023-11-02 ENCOUNTER — Other Ambulatory Visit (HOSPITAL_COMMUNITY): Payer: Self-pay

## 2023-11-02 ENCOUNTER — Ambulatory Visit (INDEPENDENT_AMBULATORY_CARE_PROVIDER_SITE_OTHER): Payer: Commercial Managed Care - PPO | Admitting: Family Medicine

## 2023-11-02 DIAGNOSIS — N958 Other specified menopausal and perimenopausal disorders: Secondary | ICD-10-CM

## 2023-11-02 DIAGNOSIS — Z6838 Body mass index (BMI) 38.0-38.9, adult: Secondary | ICD-10-CM | POA: Diagnosis not present

## 2023-11-02 DIAGNOSIS — I10 Essential (primary) hypertension: Secondary | ICD-10-CM

## 2023-11-02 DIAGNOSIS — E8941 Symptomatic postprocedural ovarian failure: Secondary | ICD-10-CM

## 2023-11-02 DIAGNOSIS — E669 Obesity, unspecified: Secondary | ICD-10-CM

## 2023-11-02 MED ORDER — PHENTERMINE HCL 37.5 MG PO TABS: 18.7500 mg | ORAL_TABLET | Freq: Every day | ORAL | 0 refills | Status: DC

## 2023-11-02 MED ORDER — TOPIRAMATE 100 MG PO TABS
100.0000 mg | ORAL_TABLET | Freq: Every day | ORAL | 0 refills | Status: DC
  Filled 2023-11-02: qty 30, 30d supply, fill #0

## 2023-11-02 MED ORDER — LOSARTAN POTASSIUM 25 MG PO TABS
25.0000 mg | ORAL_TABLET | Freq: Every day | ORAL | 1 refills | Status: DC
Start: 2023-11-02 — End: 2024-02-01
  Filled 2023-11-02 – 2024-01-14 (×2): qty 90, 90d supply, fill #0

## 2023-11-02 MED ORDER — TRIAMTERENE-HCTZ 37.5-25 MG PO TABS
1.0000 | ORAL_TABLET | Freq: Every day | ORAL | 0 refills | Status: DC
  Filled 2023-11-02 – 2024-01-25 (×2): qty 90, 90d supply, fill #0

## 2023-11-02 NOTE — Progress Notes (Signed)
 .smr  Office: [phone]  /  Fax: [phone]  WEIGHT SUMMARY AND BIOMETRICS  Anthropometric Measurements Height: 5' 8 (1.727 m) Weight: 253 lb (114.8 kg) BMI (Calculated): 38.48 Weight at Last Visit: 257 lb Weight Lost Since Last Visit: 4 lb Weight Gained Since Last Visit: 0 Starting Weight: 273 lb Total Weight Loss (lbs): 20 lb (9.072 kg) Peak Weight: 286  lb   Body Composition  Body Fat %: 49.7 % Fat Mass (lbs): 126.2 lbs Muscle Mass (lbs): 121.2 lbs Total Body Water  (lbs): 93.4 lbs Visceral Fat Rating : 15   Other Clinical Data Fasting: no Labs: no Today's Visit #: 23 Starting Date: 02/17/22    Chief Complaint: OBESITY   History of Present Illness   The patient, with a history of hypertension and obesity, presents for a routine follow-up and medication refill. She is currently on losartan  and triamterene  hydrochlorothiazide  for hypertension, and topiramate  and phentermine  for obesity. Her blood pressure is well controlled at 103/69, and she has lost four pounds in the last month. She has been adhering to a diet of 1500 calories per day with a goal of 90 grams of protein.  The patient reports no issues with her current medications and denies feeling lightheaded or dizzy. She has been maintaining hydration and engaging in a combination of cardio and strength exercises most days of the week, primarily in the evenings at home.  Sleep has been generally satisfactory, although occasionally disrupted by hot flashes, a symptom of her post-hysterectomy menopausal status. She has been managing these symptoms without any specific treatment.  The patient expresses satisfaction with her current weight loss progress and has been focusing on protein intake. She has been using protein shakes as a supplement and is exploring new recipes to maintain dietary variety and meet her protein goals.          PHYSICAL EXAM:  Blood pressure 103/69, pulse 70, temperature 97.8 F (36.6  C), height 5' 8 (1.727 m), weight 253 lb (114.8 kg), last menstrual period 06/14/2020, SpO2 94%. Body mass index is 38.47 kg/m.  DIAGNOSTIC DATA REVIEWED:  BMET    Component Value Date/Time   NA 135 03/09/2023 1455   K 3.3 (L) 03/09/2023 1455   CL 96 03/09/2023 1455   CO2 21 03/09/2023 1455   GLUCOSE 74 03/09/2023 1455   GLUCOSE 110 (H) 05/11/2021 1258   BUN 12 03/09/2023 1455   CREATININE 0.74 03/09/2023 1455   CREATININE 0.74 04/22/2015 1840   CALCIUM  10.1 03/09/2023 1455   GFRNONAA 59 (L) 05/11/2021 1258   GFRAA 82 11/04/2020 1122   Lab Results  Component Value Date   HGBA1C 5.5 03/09/2023   HGBA1C 5.7 (H) 04/23/2018   Lab Results  Component Value Date   INSULIN  18.1 03/09/2023   INSULIN  28.8 (H) 02/17/2022   Lab Results  Component Value Date   TSH 1.580 02/03/2022   CBC    Component Value Date/Time   WBC 9.7 03/09/2023 1455   WBC 9.9 05/11/2021 1258   RBC 4.66 03/09/2023 1455   RBC 4.34 05/11/2021 1258   HGB 11.5 03/09/2023 1455   HCT 36.0 03/09/2023 1455   PLT 509 (H) 03/09/2023 1455   MCV 77 (L) 03/09/2023 1455   MCH 24.7 (L) 03/09/2023 1455   MCH 23.5 (L) 05/11/2021 1258   MCHC 31.9 03/09/2023 1455   MCHC 30.4 05/11/2021 1258   RDW 15.0 03/09/2023 1455   Iron Studies    Component Value Date/Time   IRON 49 03/09/2023 1455  TIBC 394 03/09/2023 1455   FERRITIN 75 03/09/2023 1455   IRONPCTSAT 12 (L) 03/09/2023 1455   Lipid Panel     Component Value Date/Time   CHOL 164 03/09/2023 1455   TRIG 51 03/09/2023 1455   HDL 71 03/09/2023 1455   CHOLHDL 3.2 02/17/2022 0902   LDLCALC 82 03/09/2023 1455   Hepatic Function Panel     Component Value Date/Time   PROT 7.8 03/09/2023 1455   ALBUMIN 4.6 03/09/2023 1455   AST 16 03/09/2023 1455   ALT 15 03/09/2023 1455   ALKPHOS 106 03/09/2023 1455   BILITOT 0.5 03/09/2023 1455   BILIDIR 0.11 08/21/2017 1803   IBILI NOT CALCULATED 01/30/2012 0945      Component Value Date/Time   TSH 1.580  02/03/2022 1624   Nutritional Lab Results  Component Value Date   VD25OH 62.7 03/09/2023   VD25OH 53.1 10/18/2022   VD25OH 30.7 02/17/2022     Assessment and Plan    Obesity   Obesity management with topiramate  and phentermine . Patient has lost 4 pounds in the last month despite the holiday season. She adheres to a 1500-calorie diet with at least 90 grams of protein daily and exercises regularly. No medication issues reported. Discussed portion control and protein intake strategies. Provided high-protein soup and stew recipes.   - Refill topiramate    - Refill phentermine    - Continue current diet and exercise regimen   - Provide recipes for high-protein soups and stews    Hypertension   Hypertension is well-controlled with losartan  and triamterene  hydrochlorothiazide . Blood pressure is 103/69 mmHg. No side effects or symptoms reported. Patient is hydrating well and adheres to medication.   - Refill losartan    - Refill triamterene  hydrochlorothiazide     Postmenopausal hot flashes secondary to surgical menopause   Hot flashes are present but not severe, occasionally disrupting sleep. Total hysterectomy induced menopause. Advised reducing simple carbohydrates to help with hot flashes. If symptoms worsen, consult primary care provider for further management.   - Monitor symptoms   -work on decreasing simple carbs to reduce symptoms - Consult primary care provider if symptoms worsen    Follow-up   - Schedule follow-up appointment in February 2025.          She was informed of the importance of frequent follow up visits to maximize her success with intensive lifestyle modifications for her multiple health conditions.    Louann Penton, MD

## 2023-11-03 ENCOUNTER — Other Ambulatory Visit (HOSPITAL_COMMUNITY): Payer: Self-pay

## 2023-11-03 ENCOUNTER — Other Ambulatory Visit (INDEPENDENT_AMBULATORY_CARE_PROVIDER_SITE_OTHER): Payer: Self-pay | Admitting: Family Medicine

## 2023-11-03 DIAGNOSIS — F5089 Other specified eating disorder: Secondary | ICD-10-CM

## 2023-11-06 ENCOUNTER — Telehealth (INDEPENDENT_AMBULATORY_CARE_PROVIDER_SITE_OTHER): Payer: Self-pay | Admitting: Family Medicine

## 2023-11-06 ENCOUNTER — Other Ambulatory Visit (HOSPITAL_COMMUNITY): Payer: Self-pay

## 2023-11-06 MED ORDER — PHENTERMINE HCL 37.5 MG PO TABS
18.7500 mg | ORAL_TABLET | Freq: Every morning | ORAL | 0 refills | Status: DC
  Filled 2023-11-06: qty 15, 30d supply, fill #0

## 2023-11-06 NOTE — Telephone Encounter (Signed)
 Patient called in to say her refill on Phentermine did not go through to the pharmacy. Patient states she needs refill sent into pharmacy at [facility] today. Patient states she came to appt last week especially for refills.

## 2023-11-07 NOTE — Telephone Encounter (Signed)
 Prescription was called in to [facility]. The message was verbally given to me and I called [facility] pharmacy.

## 2023-11-08 ENCOUNTER — Other Ambulatory Visit (HOSPITAL_COMMUNITY): Payer: Self-pay

## 2023-11-08 DIAGNOSIS — F411 Generalized anxiety disorder: Secondary | ICD-10-CM | POA: Diagnosis not present

## 2023-11-23 DIAGNOSIS — F411 Generalized anxiety disorder: Secondary | ICD-10-CM | POA: Diagnosis not present

## 2023-11-27 ENCOUNTER — Other Ambulatory Visit (HOSPITAL_COMMUNITY): Payer: Self-pay

## 2023-11-27 ENCOUNTER — Other Ambulatory Visit: Payer: Self-pay | Admitting: Physician Assistant

## 2023-11-27 MED ORDER — ATORVASTATIN CALCIUM 20 MG PO TABS
20.0000 mg | ORAL_TABLET | Freq: Every day | ORAL | 1 refills | Status: DC
  Filled 2023-11-27: qty 90, 90d supply, fill #0
  Filled 2024-02-19: qty 90, 90d supply, fill #1

## 2023-11-28 ENCOUNTER — Encounter (INDEPENDENT_AMBULATORY_CARE_PROVIDER_SITE_OTHER): Payer: Self-pay | Admitting: Family Medicine

## 2023-11-28 ENCOUNTER — Ambulatory Visit (INDEPENDENT_AMBULATORY_CARE_PROVIDER_SITE_OTHER): Payer: Commercial Managed Care - PPO | Admitting: Family Medicine

## 2023-11-28 VITALS — BP 108/71 | HR 79 | Temp 98.3°F | Ht 68.0 in | Wt 251.0 lb

## 2023-11-28 DIAGNOSIS — F5089 Other specified eating disorder: Secondary | ICD-10-CM

## 2023-11-28 DIAGNOSIS — E669 Obesity, unspecified: Secondary | ICD-10-CM | POA: Diagnosis not present

## 2023-11-28 DIAGNOSIS — Z6838 Body mass index (BMI) 38.0-38.9, adult: Secondary | ICD-10-CM

## 2023-11-28 DIAGNOSIS — I10 Essential (primary) hypertension: Secondary | ICD-10-CM

## 2023-11-28 MED ORDER — TOPIRAMATE 100 MG PO TABS
100.0000 mg | ORAL_TABLET | Freq: Every day | ORAL | 0 refills | Status: DC
  Filled 2023-11-28 – 2023-12-04 (×2): qty 30, 30d supply, fill #0

## 2023-11-28 MED ORDER — PHENTERMINE HCL 37.5 MG PO TABS
18.7500 mg | ORAL_TABLET | Freq: Every morning | ORAL | 0 refills | Status: DC
  Filled 2023-11-28 – 2023-12-04 (×2): qty 15, 30d supply, fill #0

## 2023-11-28 NOTE — Progress Notes (Signed)
SUBJECTIVE:  Chief Complaint: Obesity  Interim History: She is doing well on her meal plan getting in all calories and all protein 90-95% of the time.  Cravings have gotten better - she used to desire something sweet but this has improved.  She can do something small and be content.  She has been able to get all protein in.  Megan Mitchell is here to discuss her progress with her obesity treatment plan. She is on the keeping a food journal and adhering to recommended goals of 1500 calories and 90 grams of protein and states she is following her eating plan approximately 90-95 % of the time. She states she is exercising and strengthening 50 minutes 4 times per week.   OBJECTIVE: Visit Diagnoses: Problem List Items Addressed This Visit       Cardiovascular and Mediastinum   HTN (hypertension)   Blood pressure well-controlled today.  Patient is on combination blood pressure therapy.  We discussed medication changes to be made at next appointment.  She just got a 90-day supply at last appointment.  Will anticipate decreasing her medications at next appointment as her blood pressures have stayed well-managed.  She denies dizziness, lightheadedness, syncope.        Other   Eating disorder - Primary   Patient recently had increase in her phentermine at last appointment up to 18.75.  She has been journaling consistently. Starting weight was 265lbs and she is now at 251.  Will increase topiramate to 100mg  a day and continue her phentermine at same dose.  PDMP checked and no concerns noted.      Relevant Medications   phentermine (ADIPEX-P) 37.5 MG tablet   topiramate (TOPAMAX) 100 MG tablet   Other Visit Diagnoses       Obesity with starting BMI of 41.5       Relevant Medications   phentermine (ADIPEX-P) 37.5 MG tablet         11/28/2023    4:00 PM 11/02/2023    4:00 PM 10/05/2023    3:00 PM  Vitals with BMI  Height 5\' 8"  5\' 8"  5\' 8"   Weight 251 lbs 253 lbs 257 lbs  BMI 38.17 38.48 39.09   Systolic 108 103 409  Diastolic 71 69 72  Pulse 79 70 75    No data recorded  No data recorded  No data recorded  No data recorded    ASSESSMENT AND PLAN:  Diet: Megan Mitchell is currently in the action stage of change. As such, her goal is to continue with weight loss efforts. She has agreed to keeping a food journal and adhering to recommended goals of 1500 calories and 90 or more grams of protein daily.Patient to start food log or journaling meal plan.  The initial goal will be to habitually log or journal for at least 4 days a week.  The expectation it that patient may not initially meet calorie or protein goals as the nturitional understanding of food intake is begun.  We discussed the 10:1 ratio when reading a food label.  Patient agrees to keep a food log either electronically or on paper and bring to the next appointment to be able to dissect and discuss it with provider.    Exercise: Megan Mitchell has been instructed to continue exercising as is for weight loss and overall health benefits.   Behavior Modification:  We discussed the following Behavioral Modification Strategies today: increasing lean protein intake, decreasing simple carbohydrates, increasing vegetables, no skipping meals, and meal planning and  cooking strategies. We discussed various medication options to help Megan Mitchell with her weight loss efforts and we both agreed to continue current dosage of phentermine and topiramate..  No follow-ups on file.Marland Kitchen She was informed of the importance of frequent follow up visits to maximize her success with intensive lifestyle modifications for her multiple health conditions.  Attestation Statements:   Reviewed by clinician on day of visit: allergies, medications, problem list, medical history, surgical history, family history, social history, and previous encounter notes.     Reuben Likes, MD

## 2023-11-28 NOTE — Assessment & Plan Note (Signed)
Patient recently had increase in her phentermine at last appointment up to 18.75.  She has been journaling consistently. Starting weight was 265lbs and she is now at 251.  Will increase topiramate to 100mg  a day and continue her phentermine at same dose.  PDMP checked and no concerns noted.

## 2023-11-29 ENCOUNTER — Other Ambulatory Visit (HOSPITAL_COMMUNITY): Payer: Self-pay

## 2023-12-03 NOTE — Assessment & Plan Note (Addendum)
Blood pressure well-controlled today.  Patient is on combination blood pressure therapy.  We discussed medication changes to be made at next appointment.  She just got a 90-day supply at last appointment.  Will anticipate decreasing her medications at next appointment as her blood pressures have stayed well-managed.  She denies dizziness, lightheadedness, syncope.

## 2023-12-04 ENCOUNTER — Other Ambulatory Visit (HOSPITAL_COMMUNITY): Payer: Self-pay

## 2023-12-14 DIAGNOSIS — F411 Generalized anxiety disorder: Secondary | ICD-10-CM | POA: Diagnosis not present

## 2023-12-26 ENCOUNTER — Encounter (INDEPENDENT_AMBULATORY_CARE_PROVIDER_SITE_OTHER): Payer: Self-pay | Admitting: Family Medicine

## 2023-12-26 ENCOUNTER — Other Ambulatory Visit (HOSPITAL_COMMUNITY): Payer: Self-pay

## 2023-12-26 ENCOUNTER — Ambulatory Visit (INDEPENDENT_AMBULATORY_CARE_PROVIDER_SITE_OTHER): Payer: Commercial Managed Care - PPO | Admitting: Family Medicine

## 2023-12-26 VITALS — BP 99/66 | HR 78 | Temp 98.3°F | Ht 68.0 in | Wt 249.0 lb

## 2023-12-26 DIAGNOSIS — Z6841 Body Mass Index (BMI) 40.0 and over, adult: Secondary | ICD-10-CM

## 2023-12-26 DIAGNOSIS — E66813 Obesity, class 3: Secondary | ICD-10-CM

## 2023-12-26 DIAGNOSIS — I10 Essential (primary) hypertension: Secondary | ICD-10-CM | POA: Diagnosis not present

## 2023-12-26 DIAGNOSIS — Z6837 Body mass index (BMI) 37.0-37.9, adult: Secondary | ICD-10-CM | POA: Diagnosis not present

## 2023-12-26 DIAGNOSIS — F5089 Other specified eating disorder: Secondary | ICD-10-CM | POA: Diagnosis not present

## 2023-12-26 MED ORDER — PHENTERMINE HCL 37.5 MG PO TABS
18.7500 mg | ORAL_TABLET | Freq: Every morning | ORAL | 0 refills | Status: DC
  Filled 2023-12-26 – 2024-01-01 (×2): qty 30, 60d supply, fill #0

## 2023-12-26 MED ORDER — TOPIRAMATE 100 MG PO TABS
100.0000 mg | ORAL_TABLET | Freq: Every day | ORAL | 0 refills | Status: DC
  Filled 2023-12-26 – 2024-01-01 (×2): qty 60, 60d supply, fill #0

## 2023-12-26 MED ORDER — AMLODIPINE BESYLATE 10 MG PO TABS: 5.0000 mg | ORAL_TABLET | Freq: Every day | ORAL | Status: DC

## 2023-12-26 NOTE — Progress Notes (Signed)
 SUBJECTIVE:  Chief Complaint: Obesity  Interim History: Patient is doing well on logging her food.  She is getting total amount of calories and protein in daily.  She is trying to stick with the same foods to ensure she is hitting her goals daily.  She is doing quite a bit of beans like pinto beans.  She is feeling like eating more nutrition is getting substantially easier.  She is going over different foods with a  dietician.  She is doing her resistance bands, floor bike pedals and some weight training.  She is doing 5 days a week and about an hour at a time.   Megan Mitchell is here to discuss her progress with her obesity treatment plan. She is on the keeping a food journal and adhering to recommended goals of 1500 calories and 90 grams of protein and states she is following her eating plan approximately 100 % of the time. She states she is exercising 60 minutes 5 times per week.   OBJECTIVE: Visit Diagnoses: Problem List Items Addressed This Visit       Cardiovascular and Mediastinum   HTN (hypertension)   Blood pressure has been well controlled.  Will decrease amlodipine to 5mg  daily and follow up on BP at subsequent appointments.  Ultimate goal to be on as little medication as possible.      Relevant Medications   amLODipine (NORVASC) 10 MG tablet     Other   Eating disorder   Starting weight was 265lbs and she is now at 249.  PDMP checked and no concerns noted. Patient is taking 18.75mg  adipex and 100mg  topiramate daily.  She denies any side effects.  She is doing well with food intake control and macronutrient choice.  She needs refills today.      Relevant Medications   topiramate (TOPAMAX) 100 MG tablet   phentermine (ADIPEX-P) 37.5 MG tablet   Class 3 severe obesity with serious comorbidity and body mass index (BMI) of 40.0 to 44.9 in adult (HCC) - Primary   Starting weight: 273 Peak weight: 285 BMR: 1973 Previous obesity management: wegovy Body Fat %:  49% Starting Meal Plan: category 3  Now on combination of phentermine and topiramate with great results in terms of scale change.      Relevant Medications   phentermine (ADIPEX-P) 37.5 MG tablet   Other Visit Diagnoses       Essential hypertension       Relevant Medications   amLODipine (NORVASC) 10 MG tablet       No data recorded No data recorded No data recorded No data recorded     12/26/2023    3:00 PM 11/28/2023    4:00 PM 11/02/2023    4:00 PM  Vitals with BMI  Height 5\' 8"  5\' 8"  5\' 8"   Weight 249 lbs 251 lbs 253 lbs  BMI 37.87 38.17 38.48  Systolic 99 108 103  Diastolic 66 71 69  Pulse 78 79 70     ASSESSMENT AND PLAN:  Diet: Megan Mitchell is currently in the action stage of change. As such, her goal is to continue with weight loss efforts and has agreed to keeping a food journal and adhering to recommended goals of 1500 calories and 90 or more grams protein daily.   Exercise: For substantial health benefits, adults should do at least 150 minutes (2 hours and 30 minutes) a week of moderate-intensity, or 75 minutes (1 hour and 15 minutes) a week of vigorous-intensity aerobic physical activity,  or an equivalent combination of moderate- and vigorous-intensity aerobic activity. Aerobic activity should be performed in episodes of at least 10 minutes, and preferably, it should be spread throughout the week. She is planning to increase total resistance and intensity by the time of the next appointment.  She is going to take measurements and photos in the next few days to follow non scale victories.  Behavior Modification:  We discussed the following Behavioral Modification Strategies today: increasing lean protein intake, increasing vegetables, better snacking choices, and keep a strict food journal. We discussed various medication options to help Megan Mitchell with her weight loss efforts and we both agreed to continue current phentermine and topiramate doses.  No follow-ups on file.Marland Kitchen  She was informed of the importance of frequent follow up visits to maximize her success with intensive lifestyle modifications for her multiple health conditions.  Attestation Statements:   Reviewed by clinician on day of visit: allergies, medications, problem list, medical history, surgical history, family history, social history, and previous encounter notes.     Reuben Likes, MD

## 2023-12-27 ENCOUNTER — Other Ambulatory Visit (HOSPITAL_COMMUNITY): Payer: Self-pay

## 2023-12-28 DIAGNOSIS — F411 Generalized anxiety disorder: Secondary | ICD-10-CM | POA: Diagnosis not present

## 2023-12-30 ENCOUNTER — Other Ambulatory Visit (HOSPITAL_COMMUNITY): Payer: Self-pay

## 2024-01-01 ENCOUNTER — Other Ambulatory Visit (HOSPITAL_COMMUNITY): Payer: Self-pay

## 2024-01-01 ENCOUNTER — Other Ambulatory Visit: Payer: Self-pay

## 2024-01-02 NOTE — Assessment & Plan Note (Signed)
 Starting weight: 273 Peak weight: 285 BMR: 1975 Previous obesity management: wegovy Body Fat %: 49% Starting Meal Plan: category 3  Now on combination of phentermine and topiramate with great results in terms of scale change.

## 2024-01-02 NOTE — Assessment & Plan Note (Signed)
 Starting weight was 265lbs and she is now at 249.  PDMP checked and no concerns noted. Patient is taking 18.75mg  adipex and 100mg  topiramate daily.  She denies any side effects.  She is doing well with food intake control and macronutrient choice.  She needs refills today.

## 2024-01-02 NOTE — Assessment & Plan Note (Signed)
 Blood pressure has been well controlled.  Will decrease amlodipine to 5mg  daily and follow up on BP at subsequent appointments.  Ultimate goal to be on as little medication as possible.

## 2024-01-11 DIAGNOSIS — F411 Generalized anxiety disorder: Secondary | ICD-10-CM | POA: Diagnosis not present

## 2024-01-15 ENCOUNTER — Other Ambulatory Visit (HOSPITAL_COMMUNITY): Payer: Self-pay

## 2024-01-26 ENCOUNTER — Ambulatory Visit (INDEPENDENT_AMBULATORY_CARE_PROVIDER_SITE_OTHER): Admitting: Internal Medicine

## 2024-01-26 ENCOUNTER — Encounter: Payer: Self-pay | Admitting: Internal Medicine

## 2024-01-26 ENCOUNTER — Other Ambulatory Visit (HOSPITAL_COMMUNITY): Payer: Self-pay

## 2024-01-26 VITALS — BP 109/73 | HR 77 | Temp 97.3°F | Ht 68.0 in | Wt 249.4 lb

## 2024-01-26 DIAGNOSIS — Z6841 Body Mass Index (BMI) 40.0 and over, adult: Secondary | ICD-10-CM | POA: Diagnosis not present

## 2024-01-26 DIAGNOSIS — F419 Anxiety disorder, unspecified: Secondary | ICD-10-CM

## 2024-01-26 DIAGNOSIS — E66813 Obesity, class 3: Secondary | ICD-10-CM

## 2024-01-26 DIAGNOSIS — I10 Essential (primary) hypertension: Secondary | ICD-10-CM | POA: Diagnosis not present

## 2024-01-26 DIAGNOSIS — E782 Mixed hyperlipidemia: Secondary | ICD-10-CM

## 2024-01-26 DIAGNOSIS — M170 Bilateral primary osteoarthritis of knee: Secondary | ICD-10-CM

## 2024-01-26 MED ORDER — ARIPIPRAZOLE 2 MG PO TABS
2.0000 mg | ORAL_TABLET | Freq: Every day | ORAL | 0 refills | Status: DC
  Filled 2024-01-26: qty 30, 30d supply, fill #0

## 2024-01-26 MED ORDER — SERTRALINE HCL 50 MG PO TABS
50.0000 mg | ORAL_TABLET | Freq: Every day | ORAL | 3 refills | Status: DC
  Filled 2024-01-26: qty 90, 90d supply, fill #0

## 2024-01-26 NOTE — Progress Notes (Signed)
 ==============================  [facility]: [phone]   -- Medical Office Visit --  Patient: Megan Mitchell      Age: 53 y.o.       Sex:  female  Date:   01/26/2024 Today's Healthcare Provider: Lula Olszewski, MD  ==============================   Chief Complaint: Concerns of possible anxiety Severe work stress anxiety - buspirone didn't help and she doesn't recall ever being on Zoloft History of Present Illness 53 year old female with established generalized anxiety disorder presenting today with worsening anxiety symptoms primarily triggered by work-related stressors. She reports feeling overwhelmed due to high workload, understaffing, and lack of support from her team lead. 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 Patient has a significant psychiatric history including generalized anxiety disorder with previous medication trials. She was prescribed sertraline 100mg  daily in September (approximately 6 months ago) but does not recall taking it. Prior trial of buspirone was reportedly ineffective. She has not been taking any anxiety medications recently and expresses desire for a consistent daily medication regimen. She is currently seeing therapist Sherre Scarlet for anxiety management but notes that medications are not managed by her therapist. Of note, patient is currently taking phentermine 18.75mg  daily as part of her weight management program, which she acknowledges may be contributing to her anxiety symptoms. She has successfully reduced her weight from 273 lbs to current 249 lbs on combination therapy with phentermine and topiramate. Patient has multiple comorbidities including hypertension (well-controlled, recently decreased amlodipine to 5mg  daily), hyperlipidemia (most recent LDL 82mg /dL, down from 146mg /dL in December 4696),  elevated coronary calcium score of 11 (all in left circumflex), bilateral knee osteoarthritis (previously treated with intra-articular injections), and class 3 obesity (BMI now 37.9, improved from previous readings). No reported memory issues despite not recalling previous sertraline prescription. No current suicidal ideation, homicidal ideation, or psychotic symptoms reported. No substance abuse.   Background: Reviewed: She has HTN (hypertension); Severe obesity (BMI >= 40) (HCC); Anemia; Pelvic mass in female; Fibroids; Urinary tract infection; Pain in left knee; Upper respiratory tract infection; Pain in right knee; Osteoarthritis of knees, bilateral; Hyperlipidemia; Elevated coronary artery calcium score; Anxiety disorder; Arthritic gait; Eating disorder; and Class 3 severe obesity with serious comorbidity and body mass index (BMI) of 40.0 to 44.9 in adult [facility]) on their problem list.  Reviewed: She   has a past medical history of Allergy, Anemia, Anxiety, Arthritis, Fibroids, Hypertension, and Ovarian cyst.  Manually updated: No problems updated.  Reviewed:  Allergies as of 01/26/2024 - Review Complete 01/26/2024  Allergen Reaction Noted   Sulfa antibiotics Rash 07/30/2013    Medications: Reviewed: Current Outpatient Medications on File Prior to Visit  Medication Sig   amLODipine (NORVASC) 10 MG tablet Take 0.5 tablets (5 mg total) by mouth daily.   atorvastatin (LIPITOR) 20 MG tablet Take 1 tablet (20 mg total) by mouth daily.   carbamide peroxide (DEBROX) 6.5 % OTIC solution Place 5 drops into both ears 2 (two) times daily.   fluconazole (DIFLUCAN) 150 MG tablet Take 1 tablet by mouth now, may repeat in 3 days as needed   fluticasone (FLONASE) 50 MCG/ACT nasal spray Place 2 sprays into both nostrils daily.   losartan (COZAAR) 25 MG tablet Take 1 tablet (25 mg total) by mouth daily.   Multiple Vitamin (MULTIVITAMIN ADULT PO) Take by mouth.   OVER THE COUNTER MEDICATION Memo 2 chews  daily for hot flashes   phentermine (ADIPEX-P) 37.5 MG tablet Take 0.5 tablets (18.75 mg total) by mouth in the morning.   Probiotic Product (FORTIFY PROBIOTIC WOMENS PO) Take by mouth.   pseudoephedrine (SUDAFED) 120 MG 12 hr tablet Take 1 tablet (120 mg total) by mouth 2 (two) times daily.   Saline (SIMPLY SALINE) 0.9 % AERS Place 2 sprays into the nose at bedtime. Spray each nostril nightly to rinse sinuses.  Additional unlimited sprays as needed nasal congestion.   sertraline (ZOLOFT) 100 MG tablet Take 1 tablet (100 mg total) by mouth daily.   topiramate (TOPAMAX) 100 MG tablet Take 1 tablet (100 mg total) by mouth daily.   triamterene-hydrochlorothiazide (MAXZIDE-25) 37.5-25 MG tablet Take 1 tablet by mouth daily.   No current facility-administered medications on file prior to visit.  There are no discontinued medications.       Physical Exam:    01/26/2024    2:14 PM 12/26/2023    3:00 PM 11/28/2023    4:00 PM  Vitals with BMI  Height 5\' 8"  5\' 8"  5\' 8"   Weight 249 lbs 6 oz 249 lbs 251 lbs  BMI 37.93 37.87 38.17  Systolic 109 99 108  Diastolic 73 66 71  Pulse 77 78 79   Wt Readings from Last 10 Encounters:  01/26/24 249 lb 6.4 oz (113.1 kg)  12/26/23 249 lb (112.9 kg)  11/28/23 251 lb (113.9 kg)  11/02/23 253 lb (114.8 kg)  10/05/23 257 lb (116.6 kg)  09/04/23 259 lb (117.5 kg)  08/09/23 260 lb 12.8 oz (118.3 kg)  08/02/23 258 lb (117 kg)  07/10/23 262 lb 9.6 oz (119.1 kg)  06/29/23 259 lb (117.5 kg)  Vital signs reviewed.  Nursing notes reviewed. Weight trend reviewed. Physical Exam  Physical Exam Truncal adiposity    General Appearance:  No acute distress appreciable.   Well-groomed, healthy-appearing female.  Well proportioned with no abnormal fat distribution.  Good muscle tone. Pulmonary:  Normal work of breathing at rest, no respiratory distress apparent. SpO2: 99 %  Musculoskeletal: All extremities are intact.  Neurological:  Awake, alert, oriented, and  engaged.  No obvious focal neurological deficits or cognitive impairments.  Sensorium seems unclouded.   Speech is clear and coherent with logical content. Psychiatric:  Appropriate mood, pleasant and cooperative demeanor, thoughtful and engaged during the exam    No results found for any visits on 01/26/24. Office Visit on 03/09/2023  Component Date Value   Glucose 03/09/2023 74    BUN 03/09/2023 12    Creatinine, Ser 03/09/2023 0.74    eGFR 03/09/2023 97    BUN/Creatinine Ratio 03/09/2023 16    Sodium 03/09/2023 135    Potassium 03/09/2023 3.3 (L)    Chloride 03/09/2023 96    CO2 03/09/2023 21    Calcium 03/09/2023 10.1    Total Protein 03/09/2023 7.8    Albumin 03/09/2023 4.6    Globulin, Total 03/09/2023 3.2    Albumin/Globulin Ratio 03/09/2023 1.4    Bilirubin Total 03/09/2023 0.5    Alkaline Phosphatase 03/09/2023 106    AST 03/09/2023 16    ALT 03/09/2023 15    Hgb A1c MFr Bld 03/09/2023 5.5    Est. average glucose Bld* 03/09/2023 111    INSULIN 03/09/2023 18.1    Cholesterol, Total 03/09/2023 164    Triglycerides 03/09/2023 51    HDL 03/09/2023 71    VLDL Cholesterol Cal 03/09/2023 11    LDL Chol Calc (NIH) 03/09/2023 82  LDL/HDL Ratio 03/09/2023 1.2    Vit D, 25-Hydroxy 03/09/2023 62.7    Total Iron Binding Capac* 03/09/2023 394    UIBC 03/09/2023 345    Iron 03/09/2023 49    Iron Saturation 03/09/2023 12 (L)    Vitamin B-12 03/09/2023 948    Folate, Hemolysate 03/09/2023 338.0    Hematocrit 03/09/2023 36.0    Folate, RBC 03/09/2023 939    Ferritin 03/09/2023 75    Retic Ct Pct 03/09/2023 1.6    WBC 03/09/2023 9.7    RBC 03/09/2023 4.66    Hemoglobin 03/09/2023 11.5    MCV 03/09/2023 77 (L)    MCH 03/09/2023 24.7 (L)    MCHC 03/09/2023 31.9    RDW 03/09/2023 15.0    Platelets 03/09/2023 509 (H)   No image results found. No results found.      Assessment & Plan Anxiety disorder, unspecified type Assessment: Patient presents with exacerbation of  anxiety symptoms primarily related to workplace stressors including high workload, understaffing, and perceived lack of support from leadership. Anxiety is specifically work-triggered with minimal home life contribution. Previous trial of buspirone was ineffective. Sertraline 100mg  was prescribed approximately 6 months ago but patient does not recall taking it. Contributing factors include concurrent use of phentermine for weight management, which may potentiate anxiety symptoms. Despite anxiety, patient maintains high functioning at work but quality of life is significantly impacted. Plan: Restart sertraline (SSRI) at 25 mg  daily for 7 days, then increase to 50 mg daily Counsel on typical timeframe for SSRI efficacy (2-4 weeks) and potential initial side effects Consider short-term adjunctive therapy with aripiprazole (Abilify) 2mg  daily PRN for immediate relief of severe anxiety symptoms, particularly before work Recommend starting aripiprazole on a weekend first to assess tolerability Discussed importance of maintaining concurrent therapy with Sherre Scarlet Return in 3-4 weeks to assess medication response and tolerability Target 20-30% reduction in anxiety symptoms within first month of treatment Discussed warning signs requiring immediate attention (suicidal ideation, severe anxiety) Class 3 severe obesity with serious comorbidity and body mass index (BMI) of 40.0 to 44.9 in adult, unspecified obesity type [facility]) Assessment: Patient currently on phentermine 18.75mg  daily and topiramate 100mg  daily with good response. Weight has decreased from 273 lbs to current 249 lbs (BMI 37.9 from previous class 3 obesity). Concern that phentermine may be contributing to anxiety symptoms, but weight loss benefits currently outweigh risks. Discussed potential for aripiprazole to cause weight gain, though typically minimal at low doses. Plan: Continue current weight management medications given excellent  progress Monitor for potential interaction between anxiety medications and weight management Prioritize transition to sertraline monotherapy when anxiety stabilized to minimize weight implications Continue regular weight monitoring Consider screening for sleep apnea which can contribute to both anxiety and weight issues Recommended home screening with Sleep as Android app or similar tool Discussed potential consideration of Zepbound (tirzepatide) for weight management in future if appropriate Primary osteoarthritis of both knees Assessment: History of bilateral knee osteoarthritis previously treated with intra-articular steroid injections. No current acute complaints regarding knees. Previous injections in both knees with good response. Plan: No acute intervention needed today Continue current conservative management Return for knee injections every 3 months as needed for symptom management Re-evaluate if pain pattern changes or interferes with daily activities Primary hypertension Assessment: Currently well-controlled on decreased dose of amlodipine 5mg  daily (previously 10mg ) plus losartan 25mg  daily and triamterene-HCTZ 37.5-25mg  daily. Most recent BP 109/73, with consistent good control at recent visits. Plan: Continue current antihypertensive regimen with  decreased amlodipine 5mg  daily Goal to minimize medication burden as appropriate Continue home BP monitoring Reassess at next visit Mixed hyperlipidemia Assessment: On atorvastatin 20mg  with excellent improvement in lipid profile. Most recent LDL 82mg /dL (previously 146mg /dL). Patient has elevated coronary calcium score of 11, indicating appropriate secondary prevention strategy. Plan: Continue atorvastatin 20mg  daily Maintain LDL goal 100mg /dL given coronary calcium score Recheck lipid panel in 6 months Follow-up Return visit in 3-4 weeks for anxiety medication evaluation         Orders Placed During this Encounter:   Meds  ordered this encounter  Medications   sertraline (ZOLOFT) 50 MG tablet    Sig: Take 1 tablet (50 mg total) by mouth daily. To start, take just half tablets daily for first 2 weeks.    Dispense:  90 tablet    Refill:  3   ARIPiprazole (ABILIFY) 2 MG tablet    Sig: Take 1 tablet (2 mg total) by mouth daily. Take before work    Dispense:  30 tablet    Refill:  0            **This document was synthesized by artificial intelligence ([employer]) using HIPAA-compliant recording of the clinical interaction;   We discussed the use of AI scribe software for clinical note transcription with the patient, who gave verbal consent to proceed.    Additional Info: This encounter employed state-of-the-art, real-time, collaborative documentation. The patient actively reviewed and assisted in updating their electronic medical record on a shared screen, ensuring transparency and facilitating joint problem-solving for the problem list, overview, and plan. This approach promotes accurate, informed care. The treatment plan was discussed and reviewed in detail, including medication safety, potential side effects, and all patient questions. We confirmed understanding and comfort with the plan. Follow-up instructions were established, including contacting the office for any concerns, returning if symptoms worsen, persist, or new symptoms develop, and precautions for potential emergency department visits.   MEDICAL DECISION MAKING ATTESTATION Level of Medical Decision Making: Moderate Complexity 1. Number and Complexity of Problems Addressed Generalized anxiety disorder with exacerbation (moderate risk) - requires medication management with consideration of potential interactions with current medications Management of established obesity with ongoing treatment plan - stable with current therapy Monitoring of well-controlled hypertension on multiple medications Management of stable hyperlipidemia on medication with  known coronary calcium score Chronic bilateral knee osteoarthritis under management - stable 2. Amount and/or Complexity of Data Reviewed and Analyzed Review of complete medication list including potential drug interactions between anxiety medications and current weight management regimen Review of weight trend showing successful weight management (273 lbs to 249 lbs) Review of vital signs showing well-controlled blood pressure Analysis of previous medication trials and responses (buspirone ineffective, sertraline not recalled) Review of previous laboratory data including comprehensive metabolic panel, lipid panel, and hematologic studies 3. Risk of Complications and/or Morbidity or Mortality Moderate risk management decisions:  Prescription drug management requiring careful consideration of multiple medication interactions Management of mental health condition requiring medication adjustment with potential side effects Decision-making around continuing stimulant medication (phentermine) which may exacerbate anxiety Consideration of timing for aripiprazole usage to minimize occupational impact Monitoring required for both anxiety symptoms and potential medication side effects Multiple possible medication side effects requiring monitoring and patient education Total face-to-face time spent with patient: 23 minutes. This level of medical decision making supports a level 4 (40981) Evaluation and Management service code based on moderate complexity MDM involving multiple stable chronic illnesses with medication management and moderate risk prescription  drug management.

## 2024-01-27 ENCOUNTER — Other Ambulatory Visit: Payer: Self-pay | Admitting: Physician Assistant

## 2024-01-27 ENCOUNTER — Other Ambulatory Visit (HOSPITAL_COMMUNITY): Payer: Self-pay

## 2024-01-27 DIAGNOSIS — I10 Essential (primary) hypertension: Secondary | ICD-10-CM

## 2024-01-27 NOTE — Assessment & Plan Note (Signed)
 Assessment: Patient currently on phentermine 18.75mg  daily and topiramate 100mg  daily with good response. Weight has decreased from 273 lbs to current 249 lbs (BMI 37.9 from previous class 3 obesity). Concern that phentermine may be contributing to anxiety symptoms, but weight loss benefits currently outweigh risks. Discussed potential for aripiprazole to cause weight gain, though typically minimal at low doses. Plan: Continue current weight management medications given excellent progress Monitor for potential interaction between anxiety medications and weight management Prioritize transition to sertraline monotherapy when anxiety stabilized to minimize weight implications Continue regular weight monitoring Consider screening for sleep apnea which can contribute to both anxiety and weight issues Recommended home screening with Sleep as Android app or similar tool Discussed potential consideration of Zepbound (tirzepatide) for weight management in future if appropriate

## 2024-01-27 NOTE — Patient Instructions (Signed)
 PATIENT INSTRUCTIONS Your Treatment Plan Thank you for coming in today. We discussed your anxiety symptoms and have created a plan to help you feel better: Medication Changes Sertraline (Zoloft) Start taking 50mg  (half of a 100mg  tablet) every morning for 7 days After 7 days, increase to one full 100mg  tablet every morning This medication takes 2-4 weeks to reach full effectiveness Continue taking this medication daily, even when you start feeling better Aripiprazole (Abilify) Take one 2mg  tablet before work as needed for immediate anxiety relief Try taking your first dose on a weekend to see how it affects you This medication may cause some drowsiness initially Use only when needed while waiting for sertraline to take effect Continue your current medications: Phentermine 18.75mg  in the morning Topiramate 100mg  daily Amlodipine 5mg  daily Losartan 25mg  daily Triamterene-HCTZ 37.5-25mg  daily Atorvastatin 20mg  daily Fluticasone nasal spray 2 sprays in each nostril daily Other medications as prescribed Important Information The sertraline may cause temporary side effects such as mild nausea, headache, or sleep changes that typically improve within 1-2 weeks Report any significant side effects or worsening anxiety symptoms Continue your therapy sessions with Sherre Scarlet We discussed how phentermine may contribute to anxiety - we're balancing your weight management needs with anxiety treatment Self-Care Recommendations Consider trying a sleep tracking app (like Sleep as Android) to assess your sleep quality Practice stress reduction techniques discussed in therapy Continue your successful dietary changes that have helped with weight loss Maintain your regular exercise as tolerated by your knees Follow-up Care Return to clinic in 3-4 weeks to evaluate how the medication is working If you experience severe anxiety that isn't relieved by medication, increased depression, thoughts of  harming yourself, or unusual changes in behavior, seek immediate care Continue to monitor your blood pressure at home Questions? If you have any questions or concerns about your treatment plan, please contact our office. Phone: [clinic phone number] Patient Portal: [portal information] Next Appointment: Please schedule your follow-up visit in 3-4 weeks

## 2024-01-27 NOTE — Assessment & Plan Note (Signed)
 Assessment: On atorvastatin 20mg  with excellent improvement in lipid profile. Most recent LDL 82mg /dL (previously 146mg /dL). Patient has elevated coronary calcium score of 11, indicating appropriate secondary prevention strategy. Plan: Continue atorvastatin 20mg  daily Maintain LDL goal 100mg /dL given coronary calcium score Recheck lipid panel in 6 months Follow-up Return visit in 3-4 weeks for anxiety medication evaluation

## 2024-01-27 NOTE — Assessment & Plan Note (Signed)
 Assessment: Currently well-controlled on decreased dose of amlodipine 5mg  daily (previously 10mg ) plus losartan 25mg  daily and triamterene-HCTZ 37.5-25mg  daily. Most recent BP 109/73, with consistent good control at recent visits. Plan: Continue current antihypertensive regimen with decreased amlodipine 5mg  daily Goal to minimize medication burden as appropriate Continue home BP monitoring Reassess at next visit

## 2024-01-27 NOTE — Assessment & Plan Note (Signed)
 Assessment: Patient presents with exacerbation of anxiety symptoms primarily related to workplace stressors including high workload, understaffing, and perceived lack of support from leadership. Anxiety is specifically work-triggered with minimal home life contribution. Previous trial of buspirone was ineffective. Sertraline 100mg  was prescribed approximately 6 months ago but patient does not recall taking it. Contributing factors include concurrent use of phentermine for weight management, which may potentiate anxiety symptoms. Despite anxiety, patient maintains high functioning at work but quality of life is significantly impacted. Plan: Restart sertraline (SSRI) at 25 mg  daily for 7 days, then increase to 50 mg daily Counsel on typical timeframe for SSRI efficacy (2-4 weeks) and potential initial side effects Consider short-term adjunctive therapy with aripiprazole (Abilify) 2mg  daily PRN for immediate relief of severe anxiety symptoms, particularly before work Recommend starting aripiprazole on a weekend first to assess tolerability Discussed importance of maintaining concurrent therapy with Sherre Scarlet Return in 3-4 weeks to assess medication response and tolerability Target 20-30% reduction in anxiety symptoms within first month of treatment Discussed warning signs requiring immediate attention (suicidal ideation, severe anxiety)

## 2024-01-27 NOTE — Assessment & Plan Note (Signed)
 Assessment: History of bilateral knee osteoarthritis previously treated with intra-articular steroid injections. No current acute complaints regarding knees. Previous injections in both knees with good response. Plan: No acute intervention needed today Continue current conservative management Return for knee injections every 3 months as needed for symptom management Re-evaluate if pain pattern changes or interferes with daily activities

## 2024-01-29 ENCOUNTER — Other Ambulatory Visit (HOSPITAL_COMMUNITY): Payer: Self-pay

## 2024-01-30 ENCOUNTER — Other Ambulatory Visit (HOSPITAL_COMMUNITY): Payer: Self-pay

## 2024-01-30 DIAGNOSIS — F411 Generalized anxiety disorder: Secondary | ICD-10-CM | POA: Diagnosis not present

## 2024-02-01 ENCOUNTER — Ambulatory Visit (INDEPENDENT_AMBULATORY_CARE_PROVIDER_SITE_OTHER): Admitting: Family Medicine

## 2024-02-01 ENCOUNTER — Other Ambulatory Visit (HOSPITAL_COMMUNITY): Payer: Self-pay

## 2024-02-01 ENCOUNTER — Encounter (INDEPENDENT_AMBULATORY_CARE_PROVIDER_SITE_OTHER): Payer: Self-pay | Admitting: Family Medicine

## 2024-02-01 ENCOUNTER — Ambulatory Visit
Admission: RE | Admit: 2024-02-01 | Discharge: 2024-02-01 | Disposition: A | Discharge status: Home / Self Care | Payer: Commercial Managed Care - PPO | Source: Ambulatory Visit | Attending: Physician Assistant

## 2024-02-01 VITALS — BP 109/71 | HR 68 | Temp 98.5°F | Ht 68.0 in | Wt 246.0 lb

## 2024-02-01 DIAGNOSIS — F509 Eating disorder, unspecified: Secondary | ICD-10-CM

## 2024-02-01 DIAGNOSIS — I10 Essential (primary) hypertension: Secondary | ICD-10-CM

## 2024-02-01 DIAGNOSIS — Z6837 Body mass index (BMI) 37.0-37.9, adult: Secondary | ICD-10-CM | POA: Diagnosis not present

## 2024-02-01 DIAGNOSIS — Z1231 Encounter for screening mammogram for malignant neoplasm of breast: Secondary | ICD-10-CM | POA: Diagnosis not present

## 2024-02-01 DIAGNOSIS — F5089 Other specified eating disorder: Secondary | ICD-10-CM

## 2024-02-01 DIAGNOSIS — E669 Obesity, unspecified: Secondary | ICD-10-CM | POA: Diagnosis not present

## 2024-02-01 MED ORDER — PHENTERMINE HCL 37.5 MG PO TABS
18.7500 mg | ORAL_TABLET | Freq: Every morning | ORAL | 0 refills | Status: DC
  Filled 2024-02-01 – 2024-02-28 (×2): qty 30, 60d supply, fill #0

## 2024-02-01 MED ORDER — TOPIRAMATE 100 MG PO TABS
100.0000 mg | ORAL_TABLET | Freq: Every day | ORAL | 0 refills | Status: DC
  Filled 2024-02-01 – 2024-02-26 (×2): qty 60, 60d supply, fill #0

## 2024-02-01 NOTE — Progress Notes (Signed)
 SUBJECTIVE:  Chief Complaint: Obesity  Interim History: Patient had a low key last 6 weeks.  She is heading to [place] to see her in-laws and her sister in laws this weekend for 5 days. She is looking forward to the break.  She is able to decompress outside of the office. She is doing well with her food journaling she is hitting her total protein intake daily and mentions she is easily staying within her calories.  She is working out and [employer] and really trying to stay consistent with her activity.   Ardeth is here to discuss her progress with her obesity treatment plan. She is on the keeping a food journal and adhering to recommended goals of 1500 calories and 90 grams of protein and states she is following her eating plan approximately 95 % of the time. She states she is exercising 60 minutes 4 times per week.   OBJECTIVE: Visit Diagnoses: Problem List Items Addressed This Visit       Cardiovascular and Mediastinum   HTN (hypertension) - Primary   Patient is doing remarkably well on her medications but her blood pressures have been lower end of normal.  She is on 5mg  of norvasc and 25 of losartan.  We will stop losartan and follow up on her BP in 6 weeks.  She can check her blood pressures at home.  She is having occasional dizziness and lightheadedness.        Other   Eating disorder   Starting weight was 265lbs and she is now at 246.  PDMP checked and no concerns noted. Patient is taking 18.75mg  adipex and 100mg  topiramate daily.  She denies any side effects and is focusing on protein intake daily.      Relevant Medications   phentermine (ADIPEX-P) 37.5 MG tablet   topiramate (TOPAMAX) 100 MG tablet   Other Visit Diagnoses       Essential hypertension         BMI 37.0-37.9, adult         Obesity with starting BMI of 41.5       Relevant Medications   phentermine (ADIPEX-P) 37.5 MG tablet       Vitals Temp: 98.5 F (36.9 C) BP: 109/71 Pulse Rate:  68 SpO2: 100 %   Anthropometric Measurements Height: 5\' 8"  (1.727 m) Weight: 246 lb (111.6 kg) BMI (Calculated): 37.41 Weight at Last Visit: 249 lb Weight Lost Since Last Visit: 3 Weight Gained Since Last Visit: 0 Starting Weight: 273 lb Total Weight Loss (lbs): 27 lb (12.2 kg)   Body Composition  Body Fat %: 49.4 % Fat Mass (lbs): 122 lbs Muscle Mass (lbs): 118.4 lbs Total Body Water (lbs): 94.2 lbs Visceral Fat Rating : 14   Other Clinical Data Today's Visit #: 26 Starting Date: 02/17/22 Comments: 1500/90     ASSESSMENT AND PLAN:  Diet: Naomee is currently in the action stage of change. As such, her goal is to continue with weight loss efforts and has agreed to keeping a food journal and adhering to recommended goals of 1500 calories and 90 or more grams of protein daily.   Exercise:  For additional and more extensive health benefits, adults should increase their aerobic physical activity to 300 minutes (5 hours) a week of moderate-intensity, or 150 minutes a week of vigorous-intensity aerobic physical activity, or an equivalent combination of moderate- and vigorous-intensity activity. Additional health benefits are gained by engaging in physical activity beyond this amount.   Behavior Modification:  We discussed the following Behavioral Modification Strategies today: increasing lean protein intake, meal planning and cooking strategies, keeping healthy foods in the home, travel eating strategies, and keep a strict food journal. We discussed various medication options to help Sabriel with her weight loss efforts and we both agreed to continue current dose of phentermine and topiramate.  Return in about 6 weeks (around 03/14/2024).Marland Kitchen She was informed of the importance of frequent follow up visits to maximize her success with intensive lifestyle modifications for her multiple health conditions.  Attestation Statements:   Reviewed by clinician on day of visit: allergies,  medications, problem list, medical history, surgical history, family history, social history, and previous encounter notes.   Reuben Likes, MD

## 2024-02-01 NOTE — Assessment & Plan Note (Signed)
 Starting weight was 265lbs and she is now at 246.  PDMP checked and no concerns noted. Patient is taking 18.75mg  adipex and 100mg  topiramate daily.  She denies any side effects and is focusing on protein intake daily.

## 2024-02-01 NOTE — Assessment & Plan Note (Signed)
 Patient is doing remarkably well on her medications but her blood pressures have been lower end of normal.  She is on 5mg  of norvasc and 25 of losartan.  We will stop losartan and follow up on her BP in 6 weeks.  She can check her blood pressures at home.  She is having occasional dizziness and lightheadedness.

## 2024-02-02 ENCOUNTER — Other Ambulatory Visit (HOSPITAL_COMMUNITY): Payer: Self-pay

## 2024-02-03 ENCOUNTER — Other Ambulatory Visit (HOSPITAL_COMMUNITY): Payer: Self-pay

## 2024-02-05 ENCOUNTER — Other Ambulatory Visit (HOSPITAL_COMMUNITY): Payer: Self-pay

## 2024-02-06 ENCOUNTER — Ambulatory Visit (INDEPENDENT_AMBULATORY_CARE_PROVIDER_SITE_OTHER): Payer: Commercial Managed Care - PPO | Admitting: Family Medicine

## 2024-02-07 ENCOUNTER — Other Ambulatory Visit (HOSPITAL_COMMUNITY): Payer: Self-pay

## 2024-02-07 ENCOUNTER — Telehealth: Payer: Self-pay | Admitting: *Deleted

## 2024-02-07 DIAGNOSIS — I10 Essential (primary) hypertension: Secondary | ICD-10-CM

## 2024-02-07 MED ORDER — AMLODIPINE BESYLATE 10 MG PO TABS
10.0000 mg | ORAL_TABLET | Freq: Every day | ORAL | 0 refills | Status: DC
  Filled 2024-02-07: qty 43, 43d supply, fill #0
  Filled 2024-02-07: qty 90, 90d supply, fill #0

## 2024-02-07 NOTE — Telephone Encounter (Signed)
 Spoke to pt , asked her if Dr. Lawson Radar had given her Rx for Amlodipine? Pt said no. Told her okay, and are you taking 10 mg or 5 mg? Pt said she is taking 10 mg daily. Pt said she has an appt to discuss medications coming up. Told her you are due for you physical this month. Pt asked if the appt can be changed to her physical. Told her I will change appt and send Rx for Amlodipine 10 mg to the pharmacy. Pt verbalized understanding. Rx sent.

## 2024-02-07 NOTE — Telephone Encounter (Signed)
 Copied from CRM [id number]. Topic: Clinical - Prescription Issue >> Feb 07, 2024 11:41 AM Adaysia C wrote: Reason for CRM: Patient was informed by her pharmacy that her refill request for amLODipine (NORVASC) 10 MG tablet was denied by the provider; patient has 2 days worth of medication left; please follow up with patient to give her the reason why the prescription refill was denied; I could not submit a refill for the medication because there are no refills left; patient requested a follow up call as soon as available #[phone]

## 2024-02-08 ENCOUNTER — Other Ambulatory Visit (HOSPITAL_COMMUNITY): Payer: Self-pay

## 2024-02-12 DIAGNOSIS — Z1339 Encounter for screening examination for other mental health and behavioral disorders: Secondary | ICD-10-CM | POA: Diagnosis not present

## 2024-02-12 DIAGNOSIS — Z1211 Encounter for screening for malignant neoplasm of colon: Secondary | ICD-10-CM | POA: Diagnosis not present

## 2024-02-12 DIAGNOSIS — Z1239 Encounter for other screening for malignant neoplasm of breast: Secondary | ICD-10-CM | POA: Diagnosis not present

## 2024-02-12 DIAGNOSIS — Z01419 Encounter for gynecological examination (general) (routine) without abnormal findings: Secondary | ICD-10-CM | POA: Diagnosis not present

## 2024-02-12 DIAGNOSIS — Z90710 Acquired absence of both cervix and uterus: Secondary | ICD-10-CM | POA: Diagnosis not present

## 2024-02-13 DIAGNOSIS — F411 Generalized anxiety disorder: Secondary | ICD-10-CM | POA: Diagnosis not present

## 2024-02-26 ENCOUNTER — Ambulatory Visit (INDEPENDENT_AMBULATORY_CARE_PROVIDER_SITE_OTHER): Admitting: Physician Assistant

## 2024-02-26 ENCOUNTER — Encounter: Payer: Self-pay | Admitting: Physician Assistant

## 2024-02-26 ENCOUNTER — Other Ambulatory Visit (HOSPITAL_COMMUNITY): Payer: Self-pay

## 2024-02-26 VITALS — BP 120/80 | HR 88 | Temp 97.3°F | Ht 68.0 in | Wt 249.4 lb

## 2024-02-26 DIAGNOSIS — F419 Anxiety disorder, unspecified: Secondary | ICD-10-CM

## 2024-02-26 DIAGNOSIS — G8929 Other chronic pain: Secondary | ICD-10-CM | POA: Diagnosis not present

## 2024-02-26 DIAGNOSIS — F411 Generalized anxiety disorder: Secondary | ICD-10-CM | POA: Diagnosis not present

## 2024-02-26 DIAGNOSIS — Z6837 Body mass index (BMI) 37.0-37.9, adult: Secondary | ICD-10-CM | POA: Diagnosis not present

## 2024-02-26 DIAGNOSIS — E88819 Insulin resistance, unspecified: Secondary | ICD-10-CM | POA: Diagnosis not present

## 2024-02-26 DIAGNOSIS — I10 Essential (primary) hypertension: Secondary | ICD-10-CM

## 2024-02-26 DIAGNOSIS — F32A Depression, unspecified: Secondary | ICD-10-CM

## 2024-02-26 DIAGNOSIS — M545 Low back pain, unspecified: Secondary | ICD-10-CM | POA: Diagnosis not present

## 2024-02-26 DIAGNOSIS — E782 Mixed hyperlipidemia: Secondary | ICD-10-CM

## 2024-02-26 DIAGNOSIS — Z0000 Encounter for general adult medical examination without abnormal findings: Secondary | ICD-10-CM | POA: Diagnosis not present

## 2024-02-26 MED ORDER — TRIAMTERENE-HCTZ 37.5-25 MG PO TABS
1.0000 | ORAL_TABLET | Freq: Every day | ORAL | 0 refills | Status: DC
  Filled 2024-02-26 – 2024-04-22 (×2): qty 90, 90d supply, fill #0

## 2024-02-26 MED ORDER — SERTRALINE HCL 100 MG PO TABS
100.0000 mg | ORAL_TABLET | Freq: Every day | ORAL | 1 refills | Status: DC
  Filled 2024-02-26: qty 90, 90d supply, fill #0
  Filled 2024-07-26: qty 90, 90d supply, fill #1

## 2024-02-26 NOTE — Progress Notes (Signed)
 Subjective:    Megan Mitchell is a 53 y.o. female and is here for a comprehensive physical exam.  HPI  Acute Concerns: None.  Chronic Issues: Hypertension: Pt has been compliant with Amlodipine  10 mg once daily, Triamterene -HCTZ 37.5-25 mg once daily, and Losartan  25 mg once daily.  Tolerating her current regimen well.  She monitors her BP at home, with reliable readings from her cuff.  Her blood pressure has improved as she has intentionally losing 20-25 lbs in the last year or so.  She has discussed the possibility of discontinuing Losartan  with her [facility] provider given her BP has improved, but has continued with her current regimen stating she wanted to hold off on any med changes until today's visit.  Denies any dizziness or lightheadedness.   Anxiety / Depression She reports her anxiety and depression has recently worsened, citing stress from her work related stress (changes and shortages) has contributed to this.  She is currently on Zoloft  50 mg once daily.  She was recently prescribed Abilify  for 2 days before stopping due to it making her "feel different".  She has been following up with her therapist every 2 weeks, which has been helpful.  Denies any SI/HI.   Hyperlipidemia Pt is compliant with Atorvastatin  20 mg once daily. Tolerating well with no adverse side effects.  No acute concerns reported today.   Weight management Pt is on Phentermine  and Topamax , managed by [facility].  She reports good compliance and tolerance.  Denies any palpitation, insomnia, or any other adverse side effects to these medications.  She reports a goal weight of 230-245 lbs  Chronic lower back pain Pt complains of chronic lower back pain, ongoing for several years but recently worsening.  She believes her back pain is exacerbated from sitting in her chair for long periods of time at work.  She states she has had the same chair since 2016.    Health Maintenance: Immunizations -- UTD.  Colonoscopy -- UTD, last done 02/26/21. Polyps resected and retrieved. Non-bleeding ext and int hemorrhoids. Repeat in 5 years, next due on 02/26/26.  Mammogram -- UTD, last done 02/01/24. No mammographic evidence of malignancy.  PAP -- Hysterectomy.  Bone Density -- N/a Diet -- Healthy & well-balanced diet.  Exercise -- Regularly exercising multiple times per week.   Sleep habits -- No concerns.  Mood -- Stable overall; see more details above.   UTD with dentist? - Yes. UTD with eye doctor? - Yes.   Weight history: Wt Readings from Last 10 Encounters:  02/26/24 249 lb 6.1 oz (113.1 kg)  02/01/24 246 lb (111.6 kg)  01/26/24 249 lb 6.4 oz (113.1 kg)  12/26/23 249 lb (112.9 kg)  11/28/23 251 lb (113.9 kg)  11/02/23 253 lb (114.8 kg)  10/05/23 257 lb (116.6 kg)  09/04/23 259 lb (117.5 kg)  08/09/23 260 lb 12.8 oz (118.3 kg)  08/02/23 258 lb (117 kg)   Body mass index is 37.92 kg/m. Patient's last menstrual period was 06/14/2020 (approximate).  Alcohol use:  reports no history of alcohol use.  Tobacco use:  Tobacco Use: Low Risk  (02/26/2024)   Patient History    Smoking Tobacco Use: Never    Smokeless Tobacco Use: Never    Passive Exposure: Not on file   Eligible for lung cancer screening? no     01/26/2024    2:28 PM  Depression screen PHQ 2/9  Decreased Interest 0  Down, Depressed, Hopeless 0  PHQ - 2 Score 0     Other providers/specialists: Patient Care Team: Alexander Iba, [place] as PCP - General (Physician Assistant)    PMHx, SurgHx, SocialHx, Medications, and Allergies were reviewed in the Visit Navigator and updated as appropriate.   Past Medical History:  Diagnosis Date   Allergy    seasonal   Anemia    Anxiety    Arthritis    left knee   Fibroids    Hypertension    Ovarian cyst      Past Surgical History:  Procedure Laterality Date   CHOLECYSTECTOMY     COLONOSCOPY     ELBOW SURGERY      ROBOTIC ASSISTED TOTAL HYSTERECTOMY WITH BILATERAL SALPINGO OOPHERECTOMY Bilateral 05/11/2021   Procedure: XI ROBOTIC ASSISTED TOTAL HYSTERECTOMY GREATER THAN 250 GRAMS WITH BILATERAL SALPINGO OOPHORECTOMY;MINI LAPAROTOMY FOR CYST DECOMPRESSION;OMENTECTOMY;  Surgeon: Alphonso Aschoff, MD;  Location: [facility];  Service: Gynecology;  Laterality: Bilateral;   TUBAL LIGATION       Family History  Problem Relation Age of Onset   Hypertension Mother    Hypertension Maternal Grandmother    Heart attack Maternal Grandfather    Hypertension Maternal Grandfather    Colon polyps Neg Hx    Esophageal cancer Neg Hx    Rectal cancer Neg Hx    Stomach cancer Neg Hx    Breast cancer Neg Hx    Colon cancer Neg Hx     Social History   Tobacco Use   Smoking status: Never   Smokeless tobacco: Never  Vaping Use   Vaping status: Never Used  Substance Use Topics   Alcohol use: No   Drug use: No    Review of Systems:   Review of Systems  Constitutional:  Negative for chills, fever, malaise/fatigue and weight loss.  HENT:  Negative for hearing loss, sinus pain and sore throat.   Respiratory:  Negative for cough and hemoptysis.   Cardiovascular:  Negative for chest pain, palpitations, leg swelling and PND.  Gastrointestinal:  Negative for abdominal pain, constipation, diarrhea, heartburn, nausea and vomiting.  Genitourinary:  Negative for dysuria, frequency and urgency.  Musculoskeletal:  Negative for back pain, myalgias and neck pain.  Skin:  Negative for itching and rash.  Neurological:  Negative for dizziness, tingling, seizures and headaches.  Endo/Heme/Allergies:  Negative for polydipsia.  Psychiatric/Behavioral:  Negative for depression. The patient is not nervous/anxious.       Objective:   BP 120/80 (BP Location: Left Arm, Patient Position: Sitting, Cuff Size: Large)   Pulse 88   Temp (!) 97.3 F (36.3 C) (Temporal)   Ht 5\' 8"  (1.727 m)   Wt 249 lb 6.1 oz (113.1 kg)   LMP 06/14/2020  (Approximate)   SpO2 99%   BMI 37.92 kg/m  Body mass index is 37.92 kg/m.   General Appearance:    Alert, cooperative, no distress, appears stated age  Head:    Normocephalic, without obvious abnormality, atraumatic  Eyes:    PERRL, conjunctiva/corneas clear, EOM's intact, fundi    benign, both eyes  Ears:    Normal TM's and external ear canals, both ears  Nose:   Nares normal, septum midline, mucosa normal, no drainage    or sinus tenderness  Throat:   Lips, mucosa, and tongue normal; teeth and gums normal  Neck:   Supple, symmetrical, trachea midline, no adenopathy;    thyroid :  no enlargement/tenderness/nodules; no carotid   bruit or JVD  Back:     Symmetric, no  curvature, ROM normal, no CVA tenderness  Lungs:     Clear to auscultation bilaterally, respirations unlabored  Chest Wall:    No tenderness or deformity   Heart:    Regular rate and rhythm, S1 and S2 normal, no murmur, rub or gallop  Breast Exam:    Deferred  Abdomen:     Soft, non-tender, bowel sounds active all four quadrants,    no masses, no organomegaly  Genitalia:    Deferred  Extremities:   Extremities normal, atraumatic, no cyanosis or edema  Pulses:   2+ and symmetric all extremities  Skin:   Skin color, texture, turgor normal, no rashes or lesions  Lymph nodes:   Cervical, supraclavicular, and axillary nodes normal  Neurologic:   CNII-XII intact, normal strength, sensation and reflexes    throughout    Assessment/Plan:   Routine physical examination Today patient counseled on age appropriate routine health concerns for screening and prevention, each reviewed and up to date or declined. Immunizations reviewed and up to date or declined. Labs ordered and reviewed. Risk factors for depression reviewed and negative. Hearing function and visual acuity are intact. ADLs screened and addressed as needed. Functional ability and level of safety reviewed and appropriate. Education, counseling and referrals performed  based on assessed risks today. Patient provided with a copy of personalized plan for preventive services.  Essential hypertension Normotensive She would like to entertain reducing medication Advised as follows Stop losartan  Measure blood pressure for 2-4 weeks, message me with readings My goal <130/90 The next step would be to take 1/2 of a triamterene -hydrochloroTHIAZIDE  tablet daily She sees [facility] pretty regularly and gets frequent monitoring of blood pressure there -- recommend close follow up with them for ongoing regular check She can always reach out to us  for input/advice  BMI 37.0-37.9, adult Improving Management per [facility]  Mixed hyperlipidemia Update lipid panel and adjust Lipitor 20 mg daily  Anxiety and depression Uncontrolled Increase Zoloft  to 100 mg She is going to consider STD or Family Medical Leave Act [facility]) Continue counseling Follow up in 3-6 month(s), sooner if concerns  Chronic bilateral low back pain without sciatica No red flags Recommend more supportive lumbar support Continue efforts at weight loss and regular exercise If she is able to improve her work chair and symptom(s) persist, she will reach out and we can get imaging and likely refer to physical therapy  If worsening -- let us  know as soon as possible    I, Bernita Bristle, acting as a Neurosurgeon for [employer], [place]., have documented all relevant documentation on the behalf of Alexander Iba, [place], as directed by  Alexander Iba, PA while in the presence of Alexander Iba, [place].  I, Bernita Bristle, have reviewed all documentation for this visit. The documentation on 02/26/24 for the exam, diagnosis, procedures, and orders are all accurate and complete.  Alexander Iba, PA-C [facility]

## 2024-02-26 NOTE — Patient Instructions (Signed)
 It was great to see you!  Stop losartan  Measure blood pressure for 2-4 weeks, message me with readings My goal <130/90  The next step would be to take 1/2 of a triamterene -hydrochloroTHIAZIDE  tablet daily  Message me if you want to consider short term disability   Let's follow-up in 1 year, sooner if you have concerns.  Take care,  Alexander Iba PA-C

## 2024-02-27 ENCOUNTER — Encounter: Payer: Self-pay | Admitting: Physician Assistant

## 2024-02-27 LAB — CBC WITH DIFFERENTIAL/PLATELET
Basophils Absolute: 0.1 10*3/uL (ref 0.0–0.1)
Basophils Relative: 0.9 % (ref 0.0–3.0)
Eosinophils Absolute: 0.3 10*3/uL (ref 0.0–0.7)
Eosinophils Relative: 3.3 % (ref 0.0–5.0)
HCT: 37 % (ref 36.0–46.0)
Hemoglobin: 12 g/dL (ref 12.0–15.0)
Lymphocytes Relative: 33.7 % (ref 12.0–46.0)
Lymphs Abs: 2.8 10*3/uL (ref 0.7–4.0)
MCHC: 32.3 g/dL (ref 30.0–36.0)
MCV: 78.6 fl (ref 78.0–100.0)
Monocytes Absolute: 0.4 10*3/uL (ref 0.1–1.0)
Monocytes Relative: 4.3 % (ref 3.0–12.0)
Neutro Abs: 4.9 10*3/uL (ref 1.4–7.7)
Neutrophils Relative %: 57.8 % (ref 43.0–77.0)
Platelets: 504 10*3/uL — ABNORMAL HIGH (ref 150.0–400.0)
RBC: 4.71 Mil/uL (ref 3.87–5.11)
RDW: 15.5 % (ref 11.5–15.5)
WBC: 8.5 10*3/uL (ref 4.0–10.5)

## 2024-02-27 LAB — LIPID PANEL
Cholesterol: 161 mg/dL (ref 0–200)
HDL: 60.8 mg/dL (ref >39.00)
LDL Cholesterol: 85 mg/dL (ref 0–99)
NonHDL: 100.69
Total CHOL/HDL Ratio: 3
Triglycerides: 76 mg/dL (ref 0.0–149.0)
VLDL: 15.2 mg/dL (ref 0.0–40.0)

## 2024-02-27 LAB — COMPREHENSIVE METABOLIC PANEL WITH GFR
ALT: 14 U/L (ref 0–35)
AST: 18 U/L (ref 0–37)
Albumin: 4.2 g/dL (ref 3.5–5.2)
Alkaline Phosphatase: 76 U/L (ref 39–117)
BUN: 16 mg/dL (ref 6–23)
CO2: 27 meq/L (ref 19–32)
Calcium: 9.8 mg/dL (ref 8.4–10.5)
Chloride: 102 meq/L (ref 96–112)
Creatinine, Ser: 0.83 mg/dL (ref 0.40–1.20)
GFR: 79.69 mL/min (ref >60.00)
Glucose, Bld: 92 mg/dL (ref 70–99)
Potassium: 3.6 meq/L (ref 3.5–5.1)
Sodium: 138 meq/L (ref 135–145)
Total Bilirubin: 0.5 mg/dL (ref 0.2–1.2)
Total Protein: 7.9 g/dL (ref 6.0–8.3)

## 2024-02-27 LAB — HEMOGLOBIN A1C: Hgb A1c MFr Bld: 6 % (ref 4.6–6.5)

## 2024-02-28 ENCOUNTER — Other Ambulatory Visit (HOSPITAL_COMMUNITY): Payer: Self-pay

## 2024-03-12 DIAGNOSIS — F411 Generalized anxiety disorder: Secondary | ICD-10-CM | POA: Diagnosis not present

## 2024-03-18 ENCOUNTER — Encounter (INDEPENDENT_AMBULATORY_CARE_PROVIDER_SITE_OTHER): Payer: Self-pay | Admitting: Family Medicine

## 2024-03-18 ENCOUNTER — Ambulatory Visit (INDEPENDENT_AMBULATORY_CARE_PROVIDER_SITE_OTHER): Admitting: Family Medicine

## 2024-03-18 ENCOUNTER — Other Ambulatory Visit (HOSPITAL_COMMUNITY): Payer: Self-pay

## 2024-03-18 VITALS — BP 109/73 | HR 68 | Temp 98.1°F | Ht 68.0 in | Wt 245.0 lb

## 2024-03-18 DIAGNOSIS — E66813 Obesity, class 3: Secondary | ICD-10-CM | POA: Diagnosis not present

## 2024-03-18 DIAGNOSIS — R7303 Prediabetes: Secondary | ICD-10-CM | POA: Diagnosis not present

## 2024-03-18 DIAGNOSIS — Z6837 Body mass index (BMI) 37.0-37.9, adult: Secondary | ICD-10-CM

## 2024-03-18 DIAGNOSIS — F5089 Other specified eating disorder: Secondary | ICD-10-CM

## 2024-03-18 DIAGNOSIS — F509 Eating disorder, unspecified: Secondary | ICD-10-CM

## 2024-03-18 MED ORDER — TOPIRAMATE 100 MG PO TABS
100.0000 mg | ORAL_TABLET | Freq: Every day | ORAL | 0 refills | Status: DC
  Filled 2024-03-18 – 2024-04-24 (×2): qty 60, 60d supply, fill #0

## 2024-03-18 MED ORDER — METFORMIN HCL 500 MG PO TABS
500.0000 mg | ORAL_TABLET | Freq: Every day | ORAL | 0 refills | Status: DC
  Filled 2024-03-18 – 2024-04-22 (×2): qty 30, 30d supply, fill #0

## 2024-03-18 MED ORDER — PHENTERMINE HCL 37.5 MG PO TABS
18.7500 mg | ORAL_TABLET | Freq: Every morning | ORAL | 0 refills | Status: DC
  Filled 2024-03-18 – 2024-04-26 (×3): qty 30, 60d supply, fill #0

## 2024-03-18 NOTE — Assessment & Plan Note (Signed)
 Patient recent A1c of 6.0 which is increased from 5.5 even with 22lb weight loss. She has been working on [employer].  Confusing picture as to how patient can be actively losing weight and monitoring food intake and see an increase in A1c.

## 2024-03-18 NOTE — Progress Notes (Unsigned)
 SUBJECTIVE:  Chief Complaint: Obesity  Interim History: Since last appointment patient went to [place]  to see her FIL and went to [place] and [place].  She is getting ready for anniversary and birthday celebration next month.  She is craving more sugar than she did previously.  She is has tootsie roll pops and will do 1-2 lollipops and is ok but is craving cake or cupcakes.  She is eating more fruit.  For Memorial Day she mentions she is invited to a cookout. She had her physical recently and she was disappointed.   Megan Mitchell is here to discuss her progress with her obesity treatment plan. She is on the keeping a food journal and adhering to recommended goals of 1500 calories and 90 grams of protein and states she is following her eating plan approximately 95 % of the time. She states she is exercising 45 minutes 4 times per week.   OBJECTIVE: Visit Diagnoses: Problem List Items Addressed This Visit       Other   Eating disorder   Prediabetes - Primary   Patient recent A1c of 6.0 which is increased from 5.5 even with 22lb weight loss. She has been working on [employer].  Confusing picture as to how patient can be actively losing weight and monitoring food intake and see an increase in A1c.         Vitals Temp: 98.1 F (36.7 C) BP: 109/73 Pulse Rate: 68 SpO2: 99 %   Anthropometric Measurements Height: 5\' 8"  (1.727 m) Weight: 245 lb (111.1 kg) BMI (Calculated): 37.26 Weight at Last Visit: 246 lb Weight Lost Since Last Visit: 1 Weight Gained Since Last Visit: 0 Starting Weight: 273 lb Total Weight Loss (lbs): 28 lb (12.7 kg)   Body Composition  Body Fat %: 49.1 % Fat Mass (lbs): 120.6 lbs Muscle Mass (lbs): 118.6 lbs Total Body Water  (lbs): 93.8 lbs Visceral Fat Rating : 14   Other Clinical Data Today's Visit #: 27 Starting Date: 02/17/22 Comments: 1500/90     ASSESSMENT AND PLAN:  Diet: Megan Mitchell is currently in the action stage of change. As such,  her goal is to continue with weight loss efforts and has agreed to keeping a food journal and adhering to recommended goals of 1500 calories and 90 or more grams of protein daily. Patient to start food log or journaling meal plan.  The initial goal will be to habitually log or journal for at least 4 days a week.  The expectation it that patient may not initially meet calorie or protein goals as the nturitional understanding of food intake is begun.  We discussed the 10:1 ratio when reading a food label.  Patient agrees to keep a food log either electronically or on paper and bring to the next appointment to be able to dissect and discuss it with provider.    Exercise:  For substantial health benefits, adults should do at least 150 minutes (2 hours and 30 minutes) a week of moderate-intensity, or 75 minutes (1 hour and 15 minutes) a week of vigorous-intensity aerobic physical activity, or an equivalent combination of moderate- and vigorous-intensity aerobic activity. Aerobic activity should be performed in episodes of at least 10 minutes, and preferably, it should be spread throughout the week.  Behavior Modification:  We discussed the following Behavioral Modification Strategies today: increasing lean protein intake, decreasing simple carbohydrates, increasing vegetables, meal planning and cooking strategies, keeping healthy foods in the home, planning for success, and keep a strict food  journal. We discussed various medication options to help Megan Mitchell with her weight loss efforts and we both agreed to continue topiramate  and phentermine  at current dose.  Return in about 6 weeks (around 04/29/2024).   She was informed of the importance of frequent follow up visits to maximize her success with intensive lifestyle modifications for her multiple health conditions.  Attestation Statements:   Reviewed by clinician on day of visit: allergies, medications, problem list, medical history, surgical history, family  history, social history, and previous encounter notes.     Donaciano Frizzle, MD

## 2024-03-19 ENCOUNTER — Other Ambulatory Visit (HOSPITAL_COMMUNITY): Payer: Self-pay

## 2024-03-25 NOTE — Assessment & Plan Note (Signed)
 Patient doing well on combination of phentermine  and topiramate . Starting weight was 265lbs and she is now at 245.  PDMP checked and no concerns noted. Patient is taking 18.75mg  adipex and 100mg  topiramate  daily.  She denies any side effects and is focusing on protein intake daily.  Will refill for 2 months and reassess treatment response at next appointment.

## 2024-03-25 NOTE — Assessment & Plan Note (Signed)
 Anthropometric Measurements Height: 5\' 8"  (1.727 m) Weight: 245 lb (111.1 kg) BMI (Calculated): 37.26 Weight at Last Visit: 246 lb Weight Lost Since Last Visit: 1 Weight Gained Since Last Visit: 0 Starting Weight: 273 lb Total Weight Loss (lbs): 28 lb (12.7 kg) Body Composition  Body Fat %: 49.1 % Fat Mass (lbs): 120.6 lbs Muscle Mass (lbs): 118.6 lbs Total Body Water  (lbs): 93.8 lbs Visceral Fat Rating : 14 Other Clinical Data Today's Visit #: 27 Starting Date: 02/17/22 Comments: 1500/90

## 2024-03-27 ENCOUNTER — Other Ambulatory Visit (HOSPITAL_COMMUNITY): Payer: Self-pay

## 2024-03-27 DIAGNOSIS — R102 Pelvic and perineal pain: Secondary | ICD-10-CM | POA: Diagnosis not present

## 2024-04-02 DIAGNOSIS — F411 Generalized anxiety disorder: Secondary | ICD-10-CM | POA: Diagnosis not present

## 2024-04-16 DIAGNOSIS — F411 Generalized anxiety disorder: Secondary | ICD-10-CM | POA: Diagnosis not present

## 2024-04-23 ENCOUNTER — Other Ambulatory Visit (HOSPITAL_COMMUNITY): Payer: Self-pay

## 2024-04-24 ENCOUNTER — Other Ambulatory Visit (HOSPITAL_COMMUNITY): Payer: Self-pay

## 2024-04-25 ENCOUNTER — Other Ambulatory Visit (HOSPITAL_COMMUNITY): Payer: Self-pay

## 2024-04-26 ENCOUNTER — Other Ambulatory Visit (HOSPITAL_COMMUNITY): Payer: Self-pay

## 2024-04-26 ENCOUNTER — Other Ambulatory Visit: Payer: Self-pay

## 2024-04-30 DIAGNOSIS — F411 Generalized anxiety disorder: Secondary | ICD-10-CM | POA: Diagnosis not present

## 2024-05-01 ENCOUNTER — Encounter (INDEPENDENT_AMBULATORY_CARE_PROVIDER_SITE_OTHER): Payer: Self-pay | Admitting: Family Medicine

## 2024-05-01 ENCOUNTER — Ambulatory Visit (INDEPENDENT_AMBULATORY_CARE_PROVIDER_SITE_OTHER): Admitting: Family Medicine

## 2024-05-01 ENCOUNTER — Other Ambulatory Visit (HOSPITAL_COMMUNITY): Payer: Self-pay

## 2024-05-01 VITALS — BP 103/70 | HR 69 | Temp 98.2°F | Ht 68.0 in | Wt 246.0 lb

## 2024-05-01 DIAGNOSIS — F5089 Other specified eating disorder: Secondary | ICD-10-CM | POA: Diagnosis not present

## 2024-05-01 DIAGNOSIS — Z6837 Body mass index (BMI) 37.0-37.9, adult: Secondary | ICD-10-CM

## 2024-05-01 DIAGNOSIS — R7303 Prediabetes: Secondary | ICD-10-CM | POA: Diagnosis not present

## 2024-05-01 DIAGNOSIS — E669 Obesity, unspecified: Secondary | ICD-10-CM | POA: Diagnosis not present

## 2024-05-01 MED ORDER — TOPIRAMATE 100 MG PO TABS
100.0000 mg | ORAL_TABLET | Freq: Every day | ORAL | 0 refills | Status: DC
  Filled 2024-05-01 – 2024-06-24 (×2): qty 60, 60d supply, fill #0

## 2024-05-01 MED ORDER — PHENTERMINE HCL 37.5 MG PO TABS
18.7500 mg | ORAL_TABLET | Freq: Every morning | ORAL | 0 refills | Status: DC
Start: 2024-05-01 — End: 2024-06-20
  Filled 2024-05-01 – 2024-06-24 (×3): qty 30, 60d supply, fill #0

## 2024-05-01 NOTE — Assessment & Plan Note (Signed)
 Patient doing well on combination of phentermine  and topiramate . Starting weight was 265lbs and she is now at 245.  PDMP checked and no concerns noted. Patient is taking 18.75mg  adipex and 100mg  topiramate  daily.  She denies any side effects and is focusing on protein intake daily.  Will refill for 2 months and reassess treatment response at next appointment.

## 2024-05-01 NOTE — Progress Notes (Signed)
 SUBJECTIVE:  Chief Complaint: Obesity  Interim History: Since last appointment patient had a birthday and she had a surprise birthday party thrown by her husband.  For her anniversary she went to [facility] and she didn't care much for it- it was too quiet. At the end of this month she is planning to go to a beach but not sure where.  She has been mostly doing the same things and exercises- vegetables, lots of steak, chicken, shrimp, vegetables, salmon.  Limiting her bread intake and is doing mostly wraps.  Megan Mitchell is here to discuss her progress with her obesity treatment plan. She is on the keeping a food journal and adhering to recommended goals of 1500 calories and 90 grams of protein and states she is following her eating plan approximately 95 % of the time. She states she is exercising.   OBJECTIVE: Visit Diagnoses: Problem List Items Addressed This Visit       Other   Eating disorder - Primary   Relevant Medications   phentermine  (ADIPEX-P ) 37.5 MG tablet   topiramate  (TOPAMAX ) 100 MG tablet   Prediabetes   Other Visit Diagnoses       Obesity with starting BMI of 41.5       Relevant Medications   phentermine  (ADIPEX-P ) 37.5 MG tablet     BMI 37.0-37.9, adult           Vitals Temp: 98.2 F (36.8 C) BP: 103/70 Pulse Rate: 69 SpO2: 99 %   Anthropometric Measurements Height: 5' 8 (1.727 m) Weight: 246 lb (111.6 kg) BMI (Calculated): 37.41 Weight at Last Visit: 245 lb Weight Lost Since Last Visit: 0 Weight Gained Since Last Visit: 1 Starting Weight: 273 lb Total Weight Loss (lbs): 27 lb (12.2 kg)   Body Composition  Body Fat %: 49.4 % Fat Mass (lbs): 122 lbs Muscle Mass (lbs): 118.4 lbs Total Body Water  (lbs): 95.2 lbs Visceral Fat Rating : 14   Other Clinical Data Today's Visit #: 28 Starting Date: 02/17/22 Comments: 1500/90     ASSESSMENT AND PLAN: Assessment & Plan Other disorder of eating Patient doing well on combination of phentermine   and topiramate . Starting weight was 265lbs and she is now at 245.  PDMP checked and no concerns noted. Patient is taking 18.75mg  adipex and 100mg  topiramate  daily.  She denies any side effects and is focusing on protein intake daily.  Will refill for 2 months and reassess treatment response at next appointment. Prediabetes Patient recent A1c of 6.0 which is increased from 5.5 even with 22lb weight loss. She has been working on [employer].  Confusing picture as to how patient can be actively losing weight and monitoring food intake and see an increase in A1c.   Obesity with starting BMI of 41.5  BMI 37.0-37.9, adult    Diet: Megan Mitchell is currently in the action stage of change. As such, her goal is to continue with weight loss efforts and has agreed to keeping a food journal and adhering to recommended goals of 1500 calories and 90 or more grams of protein daily.   Exercise:  For substantial health benefits, adults should do at least 150 minutes (2 hours and 30 minutes) a week of moderate-intensity, or 75 minutes (1 hour and 15 minutes) a week of vigorous-intensity aerobic physical activity, or an equivalent combination of moderate- and vigorous-intensity aerobic activity. Aerobic activity should be performed in episodes of at least 10 minutes, and preferably, it should be spread throughout the week.  Behavior Modification:  We discussed the following Behavioral Modification Strategies today: increasing lean protein intake, decreasing simple carbohydrates, increasing vegetables, meal planning and cooking strategies, planning for success, and keep a strict food journal. We discussed various medication options to help Megan Mitchell with her weight loss efforts and we both agreed to continue phentermine  and topiramate  at current doses.  Return in about 6 weeks (around 06/12/2024).   She was informed of the importance of frequent follow up visits to maximize her success with intensive lifestyle  modifications for her multiple health conditions.  Attestation Statements:   Reviewed by clinician on day of visit: allergies, medications, problem list, medical history, surgical history, family history, social history, and previous encounter notes.     [name], MD

## 2024-05-01 NOTE — Assessment & Plan Note (Signed)
 Patient recent A1c of 6.0 which is increased from 5.5 even with 22lb weight loss. She has been working on [employer].  Confusing picture as to how patient can be actively losing weight and monitoring food intake and see an increase in A1c.

## 2024-05-02 ENCOUNTER — Other Ambulatory Visit (HOSPITAL_COMMUNITY): Payer: Self-pay

## 2024-05-10 ENCOUNTER — Telehealth: Payer: Self-pay | Admitting: Physician Assistant

## 2024-05-10 ENCOUNTER — Other Ambulatory Visit: Payer: Self-pay | Admitting: Physician Assistant

## 2024-05-10 ENCOUNTER — Other Ambulatory Visit (HOSPITAL_COMMUNITY): Payer: Self-pay

## 2024-05-10 DIAGNOSIS — I10 Essential (primary) hypertension: Secondary | ICD-10-CM

## 2024-05-10 MED ORDER — AMLODIPINE BESYLATE 10 MG PO TABS
10.0000 mg | ORAL_TABLET | Freq: Every day | ORAL | 0 refills | Status: DC
  Filled 2024-05-10: qty 90, 90d supply, fill #0

## 2024-05-10 NOTE — Telephone Encounter (Signed)
 Duplicate request

## 2024-05-10 NOTE — Telephone Encounter (Signed)
 Copied from CRM [id number]. Topic: Clinical - Medication Refill >> May 10, 2024 10:15 AM Robinson H wrote: Medication: amLODipine  (NORVASC ) 10 MG tablet  Has the patient contacted their pharmacy? Yes, told to reach out to provider (Agent: If no, request that the patient contact the pharmacy for the refill. If patient does not wish to contact the pharmacy document the reason why and proceed with request.) (Agent: If yes, when and what did the pharmacy advise?)  This is the patient's preferred pharmacy:  [place] [facility] [address] [place] [address] Phone: [phone] Fax: [phone]    Is this the correct pharmacy for this prescription? Yes If no, delete pharmacy and type the correct one.   Has the prescription been filled recently? No  Is the patient out of the medication? Yes  Has the patient been seen for an appointment in the last year OR does the patient have an upcoming appointment? Yes  Can we respond through MyChart? Yes  Agent: Please be advised that Rx refills may take up to 3 business days. We ask that you follow-up with your pharmacy.

## 2024-05-14 DIAGNOSIS — F411 Generalized anxiety disorder: Secondary | ICD-10-CM | POA: Diagnosis not present

## 2024-05-20 ENCOUNTER — Other Ambulatory Visit: Payer: Self-pay

## 2024-05-21 ENCOUNTER — Other Ambulatory Visit: Payer: Self-pay | Admitting: Physician Assistant

## 2024-05-21 ENCOUNTER — Other Ambulatory Visit (INDEPENDENT_AMBULATORY_CARE_PROVIDER_SITE_OTHER): Payer: Self-pay | Admitting: Family Medicine

## 2024-05-21 DIAGNOSIS — R7303 Prediabetes: Secondary | ICD-10-CM

## 2024-05-22 ENCOUNTER — Other Ambulatory Visit: Payer: Self-pay

## 2024-05-22 ENCOUNTER — Telehealth (INDEPENDENT_AMBULATORY_CARE_PROVIDER_SITE_OTHER): Payer: Self-pay | Admitting: Family Medicine

## 2024-05-22 ENCOUNTER — Other Ambulatory Visit (HOSPITAL_COMMUNITY): Payer: Self-pay

## 2024-05-22 MED ORDER — ATORVASTATIN CALCIUM 20 MG PO TABS
20.0000 mg | ORAL_TABLET | Freq: Every day | ORAL | 1 refills | Status: DC
  Filled 2024-05-22: qty 90, 90d supply, fill #0

## 2024-05-22 NOTE — Telephone Encounter (Signed)
 Patient needs a refill of Metformin . Patient's last pill will be taken on this upcoming Friday 05/24/2024. Pt did not get a refill during last visit with Dr. RAYMOND.

## 2024-05-23 ENCOUNTER — Other Ambulatory Visit (HOSPITAL_COMMUNITY): Payer: Self-pay

## 2024-05-23 ENCOUNTER — Other Ambulatory Visit: Payer: Self-pay | Admitting: Bariatrics

## 2024-05-23 DIAGNOSIS — R7303 Prediabetes: Secondary | ICD-10-CM

## 2024-05-23 MED ORDER — METFORMIN HCL 500 MG PO TABS
500.0000 mg | ORAL_TABLET | Freq: Every day | ORAL | 0 refills | Status: DC
  Filled 2024-05-23: qty 30, 30d supply, fill #0

## 2024-05-23 NOTE — Telephone Encounter (Signed)
 LAST APPOINTMENT DATE: 05/01/24 NEXT APPOINTMENT DATE: 06/20/24   [place] [facility] [address] [place] [address] Phone: [phone] Fax: [phone]  [id number] [address] ([place]), [place] - [address] [place] ([place]) [place] [address] Phone: [phone] Fax: [phone]  [facility] - [place], [place] - [address] [place] [address] Phone: [phone] Fax: [phone]  Patient is requesting a refill of the following medications: Requested Prescriptions    No prescriptions requested or ordered in this encounter    Date last filled: 03/18/24 Previously prescribed by Dr Berkeley  Lab Results  Component Value Date   HGBA1C 6.0 02/26/2024   HGBA1C 5.5 03/09/2023   HGBA1C 5.6 10/18/2022   Lab Results  Component Value Date   LDLCALC 85 02/26/2024   CREATININE 0.83 02/26/2024   Lab Results  Component Value Date   VD25OH 62.7 03/09/2023   VD25OH 53.1 10/18/2022   VD25OH 30.7 02/17/2022    BP Readings from Last 3 Encounters:  05/01/24 103/70  03/18/24 109/73  02/26/24 120/80

## 2024-05-28 DIAGNOSIS — F411 Generalized anxiety disorder: Secondary | ICD-10-CM | POA: Diagnosis not present

## 2024-06-11 DIAGNOSIS — F411 Generalized anxiety disorder: Secondary | ICD-10-CM | POA: Diagnosis not present

## 2024-06-20 ENCOUNTER — Ambulatory Visit (INDEPENDENT_AMBULATORY_CARE_PROVIDER_SITE_OTHER): Admitting: Family Medicine

## 2024-06-20 ENCOUNTER — Encounter (INDEPENDENT_AMBULATORY_CARE_PROVIDER_SITE_OTHER): Payer: Self-pay | Admitting: Family Medicine

## 2024-06-20 VITALS — BP 107/68 | HR 76 | Temp 98.0°F | Ht 68.0 in | Wt 246.0 lb

## 2024-06-20 DIAGNOSIS — E669 Obesity, unspecified: Secondary | ICD-10-CM | POA: Diagnosis not present

## 2024-06-20 DIAGNOSIS — Z6837 Body mass index (BMI) 37.0-37.9, adult: Secondary | ICD-10-CM

## 2024-06-20 DIAGNOSIS — F5089 Other specified eating disorder: Secondary | ICD-10-CM

## 2024-06-20 DIAGNOSIS — R7303 Prediabetes: Secondary | ICD-10-CM | POA: Diagnosis not present

## 2024-06-20 NOTE — Progress Notes (Signed)
 SUBJECTIVE:  Chief Complaint: Obesity  Interim History: Patient has had a great summer so far.  She has been working hard and then taking time off.  She is doing everything she can food and exercise wise and feels frustrated with the plateau she is on. She feels the medications she is on help keeps control of her intake.  She is watching her diet, calories and protein intake.  Nothing planned for Labor Day yet.  She went to zumba recently and really enjoyed herself.   Megan Mitchell is here to discuss her progress with her obesity treatment plan. She is on the keeping a food journal and adhering to recommended goals of 1500 calories and 90 grams of protein and states she is following her eating plan approximately 90 % of the time. She states she is walking, strength, weights for 45 minutes 4 times per week.   OBJECTIVE: Visit Diagnoses: Problem List Items Addressed This Visit   None   Vitals Temp: 98 F (36.7 C) BP: 107/68 Pulse Rate: 76 SpO2: 100 %   Anthropometric Measurements Height: 5' 8 (1.727 m) Weight: 246 lb (111.6 kg) BMI (Calculated): 37.41 Weight at Last Visit: 246 lb Weight Lost Since Last Visit: 0 Weight Gained Since Last Visit: 0 Starting Weight: 273 lb Total Weight Loss (lbs): 27 lb (12.2 kg)   Body Composition  Body Fat %: 49.7 % Fat Mass (lbs): 122.4 lbs Muscle Mass (lbs): 117.6 lbs Total Body Water  (lbs): 98.2 lbs Visceral Fat Rating : 14   Other Clinical Data Today's Visit #: 29 Starting Date: 02/17/22 Comments: 1500/90     ASSESSMENT AND PLAN: Assessment & Plan Other disorder of eating Starting weight of 265 pounds on combination phentermine  and topiramate .  Current weight of 246 pounds.  Patient has experienced greater than 5% weight loss on combination medications.  PDMP was checked with no concerns.  Patient is currently taking 18.75 mg of Adipex as well as 100 mg of topiramate  daily.  Needs refills of both today.  Refill sent to  pharmacy. Prediabetes Last A1c of 6.0 in April of this year.  Patient has been food logging monitoring her dietary intake and making sure she is getting in enough protein while staying within the calorie goal.  Continue current calorie goal and protein goal as outlined below. Obesity with starting BMI of 41.5  BMI 37.0-37.9, adult    Diet: Megan Mitchell is currently in the action stage of change. As such, her goal is to continue with weight loss efforts and has agreed to keeping a food journal and adhering to recommended goals of 1500 calories and 90 or more grams protein.   Exercise:  For additional and more extensive health benefits, adults should increase their aerobic physical activity to 300 minutes (5 hours) a week of moderate-intensity, or 150 minutes a week of vigorous-intensity aerobic physical activity, or an equivalent combination of moderate- and vigorous-intensity activity. Additional health benefits are gained by engaging in physical activity beyond this amount.  and Adults should also include muscle-strengthening activities that involve all major muscle groups on 2 or more days a week.  Behavior Modification:  We discussed the following Behavioral Modification Strategies today: increasing lean protein intake, decreasing simple carbohydrates, increasing vegetables, meal planning and cooking strategies, keeping healthy foods in the home, and planning for success. We discussed various medication options to help Megan Mitchell with her weight loss efforts and we both agreed to continue current medications of phentermine  and topiramate  at current doses.  Return in  about 8 weeks (around 08/15/2024).   She was informed of the importance of frequent follow up visits to maximize her success with intensive lifestyle modifications for her multiple health conditions.  Attestation Statements:   Reviewed by clinician on day of visit: allergies, medications, problem list, medical history, surgical history,  family history, social history, and previous encounter notes.     Megan Mitchell Cho, MD

## 2024-06-24 ENCOUNTER — Other Ambulatory Visit: Payer: Self-pay | Admitting: Family Medicine

## 2024-06-24 ENCOUNTER — Other Ambulatory Visit: Payer: Self-pay | Admitting: Bariatrics

## 2024-06-24 ENCOUNTER — Other Ambulatory Visit (HOSPITAL_COMMUNITY): Payer: Self-pay

## 2024-06-24 DIAGNOSIS — R7303 Prediabetes: Secondary | ICD-10-CM

## 2024-06-25 ENCOUNTER — Other Ambulatory Visit (HOSPITAL_COMMUNITY): Payer: Self-pay

## 2024-06-25 ENCOUNTER — Other Ambulatory Visit (INDEPENDENT_AMBULATORY_CARE_PROVIDER_SITE_OTHER): Payer: Self-pay

## 2024-06-25 ENCOUNTER — Encounter (INDEPENDENT_AMBULATORY_CARE_PROVIDER_SITE_OTHER): Payer: Self-pay

## 2024-06-25 DIAGNOSIS — R7303 Prediabetes: Secondary | ICD-10-CM

## 2024-06-25 DIAGNOSIS — F411 Generalized anxiety disorder: Secondary | ICD-10-CM | POA: Diagnosis not present

## 2024-06-25 MED ORDER — METFORMIN HCL 500 MG PO TABS
500.0000 mg | ORAL_TABLET | Freq: Every day | ORAL | 0 refills | Status: DC
  Filled 2024-06-25: qty 30, 30d supply, fill #0

## 2024-07-02 MED ORDER — PHENTERMINE HCL 37.5 MG PO TABS
18.7500 mg | ORAL_TABLET | Freq: Every morning | ORAL | 0 refills | Status: DC
  Filled 2024-07-02: qty 30, 60d supply, fill #0

## 2024-07-02 MED ORDER — TOPIRAMATE 100 MG PO TABS
100.0000 mg | ORAL_TABLET | Freq: Every day | ORAL | 0 refills | Status: DC
  Filled 2024-07-02: qty 60, 60d supply, fill #0

## 2024-07-02 NOTE — Assessment & Plan Note (Signed)
 Last A1c of 6.0 in April of this year.  Patient has been food logging monitoring her dietary intake and making sure she is getting in enough protein while staying within the calorie goal.  Continue current calorie goal and protein goal as outlined below.

## 2024-07-02 NOTE — Assessment & Plan Note (Signed)
 Starting weight of 265 pounds on combination phentermine  and topiramate .  Current weight of 246 pounds.  Patient has experienced greater than 5% weight loss on combination medications.  PDMP was checked with no concerns.  Patient is currently taking 18.75 mg of Adipex as well as 100 mg of topiramate  daily.  Needs refills of both today.  Refill sent to pharmacy.

## 2024-07-03 ENCOUNTER — Other Ambulatory Visit (HOSPITAL_COMMUNITY): Payer: Self-pay

## 2024-07-09 ENCOUNTER — Telehealth: Payer: Self-pay | Admitting: *Deleted

## 2024-07-09 DIAGNOSIS — F411 Generalized anxiety disorder: Secondary | ICD-10-CM | POA: Diagnosis not present

## 2024-07-09 NOTE — Telephone Encounter (Signed)
 Copied from CRM [id number]. Topic: Clinical - Request for Lab/Test Order >> Jul 09, 2024 11:53 AM [name] wrote: Reason for CRM: Patient is requesting a scan on her back, for lower back pain. Please advise the patient.

## 2024-07-09 NOTE — Telephone Encounter (Signed)
 Please contact pt and schedule an appt to evaluate back pain. We can not order testing without pt being seen.

## 2024-07-11 ENCOUNTER — Telehealth: Payer: Self-pay | Admitting: *Deleted

## 2024-07-11 NOTE — Telephone Encounter (Signed)
 Copied from CRM [id number]. Topic: General - Other >> Jul 11, 2024  8:01 AM Robinson H wrote: Reason for CRM: Patient called to reschedule appointment having some back pain declined being triaged.

## 2024-07-11 NOTE — Telephone Encounter (Signed)
 Please call patient to schedule appt

## 2024-07-15 ENCOUNTER — Ambulatory Visit: Payer: Self-pay

## 2024-07-15 NOTE — Telephone Encounter (Signed)
 Noted

## 2024-07-15 NOTE — Telephone Encounter (Signed)
  FYI Only or Action Required?: FYI only for provider.  Patient was last seen in primary care on 06/20/2024 by Megan Adelita PENNER, MD.  Called Nurse Triage reporting Back Pain.  Symptoms began several weeks ago.  Interventions attempted: OTC medications: advil .  Symptoms are: unchanged.  Triage Disposition: See PCP When Office is Open (Within 3 Days)  Patient/caregiver understands and will follow disposition?: Unsure  Copied from CRM #[id number]. Topic: Clinical - Red Word Triage >> Jul 15, 2024  1:20 PM Winona R wrote: Pt calling to reschedule an appointment for Sharp back pain on right side for about two weeks. Reason for Disposition  [1] MODERATE back pain (e.g., interferes with normal activities) AND [2] present > 3 days  Answer Assessment - Initial Assessment Questions Patient did not want to look for sooner appointment, scheduled for 08/05/2024  1. ONSET: When did the pain begin? (e.g., minutes, hours, days)     Two weeks ago 2. LOCATION: Where does it hurt? (upper, mid or lower back)     Right low back pain 3. SEVERITY: How bad is the pain?  (e.g., Scale 1-10; mild, moderate, or severe)     7/10 4. PATTERN: Is the pain constant? (e.g., yes, no; constant, intermittent)      Comes and goes 5. RADIATION: Does the pain shoot into your legs or somewhere else?     denies 6. CAUSE:  What do you think is causing the back pain?      unsure 7. BACK OVERUSE:  Any recent lifting of heavy objects, strenuous work or exercise?     Standing and cleaning aggravate pain 8. MEDICINES: What have you taken so far for the pain? (e.g., nothing, acetaminophen , NSAIDS)     Advil , does not take often.  Tries to deal with the pain 9. NEUROLOGIC SYMPTOMS: Do you have any weakness, numbness, or problems with bowel/bladder control?     denies 10. OTHER SYMPTOMS: Do you have any other symptoms? (e.g., fever, abdomen pain, burning with urination, blood in urine)       denies 11.  PREGNANCY: Is there any chance you are pregnant? When was your last menstrual period?       N/a  Protocols used: Back Pain-A-AH

## 2024-07-19 ENCOUNTER — Ambulatory Visit: Admitting: Physician Assistant

## 2024-07-25 ENCOUNTER — Other Ambulatory Visit: Payer: Self-pay | Admitting: Physician Assistant

## 2024-07-25 ENCOUNTER — Other Ambulatory Visit (INDEPENDENT_AMBULATORY_CARE_PROVIDER_SITE_OTHER): Payer: Self-pay | Admitting: Family Medicine

## 2024-07-25 DIAGNOSIS — I10 Essential (primary) hypertension: Secondary | ICD-10-CM

## 2024-07-25 DIAGNOSIS — R7303 Prediabetes: Secondary | ICD-10-CM

## 2024-07-26 ENCOUNTER — Other Ambulatory Visit: Payer: Self-pay

## 2024-07-26 ENCOUNTER — Other Ambulatory Visit (HOSPITAL_COMMUNITY): Payer: Self-pay

## 2024-07-26 MED ORDER — TRIAMTERENE-HCTZ 37.5-25 MG PO TABS
1.0000 | ORAL_TABLET | Freq: Every day | ORAL | 0 refills | Status: DC
  Filled 2024-07-26: qty 90, 90d supply, fill #0

## 2024-07-27 ENCOUNTER — Other Ambulatory Visit (HOSPITAL_COMMUNITY): Payer: Self-pay

## 2024-07-29 ENCOUNTER — Other Ambulatory Visit (HOSPITAL_COMMUNITY): Payer: Self-pay

## 2024-07-29 MED ORDER — METFORMIN HCL 500 MG PO TABS
500.0000 mg | ORAL_TABLET | Freq: Every day | ORAL | 0 refills | Status: DC
  Filled 2024-07-29: qty 30, 30d supply, fill #0

## 2024-07-29 NOTE — Telephone Encounter (Signed)
Check with Dr Marquis Lunch

## 2024-07-30 ENCOUNTER — Encounter: Payer: Self-pay | Admitting: Family Medicine

## 2024-07-30 ENCOUNTER — Ambulatory Visit (INDEPENDENT_AMBULATORY_CARE_PROVIDER_SITE_OTHER): Admitting: Family Medicine

## 2024-07-30 ENCOUNTER — Other Ambulatory Visit (HOSPITAL_COMMUNITY): Payer: Self-pay

## 2024-07-30 VITALS — BP 122/82 | HR 83 | Temp 98.2°F | Ht 68.0 in | Wt 244.2 lb

## 2024-07-30 DIAGNOSIS — J3081 Allergic rhinitis due to animal (cat) (dog) hair and dander: Secondary | ICD-10-CM

## 2024-07-30 DIAGNOSIS — Z23 Encounter for immunization: Secondary | ICD-10-CM | POA: Diagnosis not present

## 2024-07-30 DIAGNOSIS — I10 Essential (primary) hypertension: Secondary | ICD-10-CM | POA: Diagnosis not present

## 2024-07-30 DIAGNOSIS — J301 Allergic rhinitis due to pollen: Secondary | ICD-10-CM

## 2024-07-30 MED ORDER — FLUTICASONE PROPIONATE 50 MCG/ACT NA SUSP
1.0000 | Freq: Every day | NASAL | 6 refills | Status: AC
  Filled 2024-07-30: qty 16, 60d supply, fill #0

## 2024-07-30 NOTE — Progress Notes (Signed)
 Subjective  CC:  Chief Complaint  Patient presents with   Cough    Cough with head congestion and drainage since Friday. COVID test was negatinge.    HPI: Megan Mitchell is a 53 y.o. female who presents to the office today to address the problems listed above in the chief complaint. Discussed the use of AI scribe software for clinical note transcription with the patient, who gave verbal consent to proceed. Same day acute visit; PCP not available. New pt to me. Chart reviewed.   History of Present Illness Megan Mitchell is a 54 year old female with allergies who presents with congestion and dry cough. She has hypertension and prediabetes.  Reports long history of allergies  Upper respiratory symptoms - Congestion and sinus pressure present since Friday following a recent flight (4 days ago) - Clear nasal discharge - Sneezing and sinus congestion - No fever  Cough - Dry cough present since Friday following the flight - Robitussin used for cough without relief  Allergic exposures and allergic rhinitis - Known allergies to cats, dogs, other items with hair, and newspaper - Exposure to pet allergens during recent flight - Did not take allergy medication prior to travel - History of seasonal allergies; previously took allergy medications regularly but had stopped prior to this incident (flonase  is listed on med list) - Zyrtec  used during trip without symptom relief  No myalgias or ST   Assessment  1. Allergic rhinitis due to animal hair and dander   2. Need for influenza vaccination   3. Seasonal allergic rhinitis due to pollen   4. Primary hypertension      Plan  Assessment and Plan Assessment & Plan Acute allergic rhinitis with cough and nasal congestion Allergic rhinitis with nasal congestion and dry cough likely due to allergens and possible viral component.  - Zyrtec  nightly , explained can take up to a week before symptoms improve - Prescribed Flonase  nasal  spray. - Recommend Mucinex DM OTC. - Avoid decongestants due to hypertension. - Reassess in 1-2 weeks. - Consider Flonase  and Zyrtec  through fall if chronic.  Monitor bp.  Discussed symptoms of URI    Follow up: prn Orders Placed This Encounter  Procedures   Flu vaccine trivalent PF, 6mos and older(Flulaval,Afluria,Fluarix,Fluzone)   Meds ordered this encounter  Medications   fluticasone  (FLONASE ) 50 MCG/ACT nasal spray    Sig: Place 1 spray into both nostrils daily.    Dispense:  16 g    Refill:  6     I reviewed the patients updated PMH, FH, and SocHx.  Patient Active Problem List   Diagnosis Date Noted   Prediabetes 03/18/2024   Eating disorder 09/05/2023   Class 3 severe obesity with serious comorbidity and body mass index (BMI) of 40.0 to 44.9 in adult 09/05/2023   Arthritic gait 03/06/2023   Anxiety disorder 01/16/2023   Hyperlipidemia 12/14/2022   Elevated coronary artery calcium  score 12/14/2022   Pain in right knee 11/08/2022   Osteoarthritis of knees, bilateral 11/08/2022   Upper respiratory tract infection 07/27/2022   Pain in left knee 04/20/2022   Urinary tract infection 05/11/2021   Pelvic mass in female 04/08/2021   Fibroids 04/08/2021   Anemia 12/26/2014   HTN (hypertension) 03/22/2014   Severe obesity (BMI >= 40) (HCC) 03/22/2014   Current Meds  Medication Sig   amLODipine  (NORVASC ) 10 MG tablet Take 1 tablet (10 mg total) by mouth daily.   atorvastatin  (LIPITOR) 20 MG tablet Take  1 tablet (20 mg total) by mouth daily.   fluticasone  (FLONASE ) 50 MCG/ACT nasal spray Place 1 spray into both nostrils daily.   metFORMIN  (GLUCOPHAGE ) 500 MG tablet Take 1 tablet (500 mg total) by mouth daily with breakfast.   Multiple Vitamin (MULTIVITAMIN ADULT PO) Take by mouth.   phentermine  (ADIPEX-P ) 37.5 MG tablet Take 0.5 tablets (18.75 mg total) by mouth in the morning.   Probiotic Product (FORTIFY PROBIOTIC WOMENS PO) Take by mouth.   sertraline  (ZOLOFT ) 100  MG tablet Take 1 tablet (100 mg total) by mouth daily.   topiramate  (TOPAMAX ) 100 MG tablet Take 1 tablet (100 mg total) by mouth daily.   triamterene -hydrochlorothiazide  (MAXZIDE -25) 37.5-25 MG tablet Take 1 tablet by mouth daily.   [DISCONTINUED] fluticasone  (FLONASE ) 50 MCG/ACT nasal spray Place 2 sprays into both nostrils daily.   Allergies: Patient is allergic to sulfa  antibiotics. Family History: Patient family history includes Heart attack in her maternal grandfather; Hypertension in her maternal grandfather, maternal grandmother, and mother. Social History:  Patient  reports that she has never smoked. She has never used smokeless tobacco. She reports that she does not drink alcohol and does not use drugs.  Review of Systems: Constitutional: Negative for fever malaise or anorexia Cardiovascular: negative for chest pain Respiratory: negative for SOB or persistent cough Gastrointestinal: negative for abdominal pain  Objective  Vitals: BP 122/82   Pulse 83   Temp 98.2 F (36.8 C)   Ht 5' 8 (1.727 m)   Wt 244 lb 3.2 oz (110.8 kg)   LMP 06/14/2020 (Approximate)   SpO2 100%   BMI 37.13 kg/m  General: no acute distress , A&Ox3 send nasal drainage and throat clearing present. HEENT: PEERL, conjunctiva normal, neck is supple, oropharynx clear, TMs clear bilaterally, Cardiovascular:  RRR without murmur or gallop.  Respiratory:  Good breath sounds bilaterally, CTAB with normal respiratory effort Skin:  Warm, no rashes Commons side effects, risks, benefits, and alternatives for medications and treatment plan prescribed today were discussed, and the patient expressed understanding of the given instructions. Patient is instructed to call or message via MyChart if he/she has any questions or concerns regarding our treatment plan. No barriers to understanding were identified. We discussed Red Flag symptoms and signs in detail. Patient expressed understanding regarding what to do in case of  urgent or emergency type symptoms.  Medication list was reconciled, printed and provided to the patient in AVS. Patient instructions and summary information was reviewed with the patient as documented in the AVS. This note was prepared with assistance of Dragon voice recognition software. Occasional wrong-word or sound-a-like substitutions may have occurred due to the inherent limitations of voice recognition software

## 2024-07-30 NOTE — Patient Instructions (Addendum)
 Please follow up if symptoms do not improve or as needed.     VISIT SUMMARY: Megan Mitchell, you visited us  today due to congestion and a dry cough that started after a recent flight. You have a history of allergies and were exposed to pet allergens during your trip. You have been experiencing sneezing, sinus congestion, and clear nasal discharge, but no fever.  YOUR PLAN: -ACUTE ALLERGIC RHINITIS WITH COUGH AND NASAL CONGESTION: Allergic rhinitis is an allergic reaction that causes sneezing, congestion, and a runny nose. Your symptoms are likely due to allergens and possibly a viral component. You should take Zyrtec  nightly for one week and use Flonase  nasal spray as prescribed. Additionally, you can use Mucinex DM, an over-the-counter medication, to help with your cough. Avoid decongestants because of your high blood pressure. We will reassess your condition in 1-2 weeks. If your symptoms persist, consider continuing Flonase  and Zyrtec  through the fall season. If symptoms worsen, with more cough and chest congestion, then this is likely a viral infection. Continue supportive care and it should improve on its own.  INSTRUCTIONS: Please follow up in 1-2 weeks for reassessment. If your symptoms persist, we may need to continue your medications through the fall season.                      Contains text generated by Abridge.                                 Contains text generated by Abridge.

## 2024-08-05 ENCOUNTER — Other Ambulatory Visit (HOSPITAL_COMMUNITY): Payer: Self-pay

## 2024-08-05 ENCOUNTER — Encounter: Payer: Self-pay | Admitting: Physician Assistant

## 2024-08-05 ENCOUNTER — Ambulatory Visit: Admitting: Physician Assistant

## 2024-08-05 VITALS — BP 120/80 | HR 78 | Temp 97.7°F | Ht 68.0 in | Wt 250.2 lb

## 2024-08-05 DIAGNOSIS — Z23 Encounter for immunization: Secondary | ICD-10-CM

## 2024-08-05 DIAGNOSIS — M545 Low back pain, unspecified: Secondary | ICD-10-CM

## 2024-08-05 DIAGNOSIS — F419 Anxiety disorder, unspecified: Secondary | ICD-10-CM | POA: Diagnosis not present

## 2024-08-05 DIAGNOSIS — I10 Essential (primary) hypertension: Secondary | ICD-10-CM | POA: Diagnosis not present

## 2024-08-05 DIAGNOSIS — E785 Hyperlipidemia, unspecified: Secondary | ICD-10-CM

## 2024-08-05 MED ORDER — AMLODIPINE BESYLATE 10 MG PO TABS
10.0000 mg | ORAL_TABLET | Freq: Every day | ORAL | 3 refills | Status: AC
  Filled 2024-08-05: qty 90, 90d supply, fill #0
  Filled 2024-11-12: qty 90, 90d supply, fill #1

## 2024-08-05 MED ORDER — ATORVASTATIN CALCIUM 20 MG PO TABS
20.0000 mg | ORAL_TABLET | Freq: Every day | ORAL | 3 refills | Status: AC
  Filled 2024-08-05: qty 90, 90d supply, fill #0
  Filled 2024-11-12: qty 90, 90d supply, fill #1

## 2024-08-05 NOTE — Patient Instructions (Signed)
 Wt Readings from Last 3 Encounters:  08/05/24 250 lb 4 oz (113.5 kg)  07/30/24 244 lb 3.2 oz (110.8 kg)  06/20/24 246 lb (111.6 kg)   Wt Readings from Last 12 Encounters:  08/05/24 250 lb 4 oz (113.5 kg)  07/30/24 244 lb 3.2 oz (110.8 kg)  06/20/24 246 lb (111.6 kg)  05/01/24 246 lb (111.6 kg)  03/18/24 245 lb (111.1 kg)  02/26/24 249 lb 6.1 oz (113.1 kg)  02/01/24 246 lb (111.6 kg)  01/26/24 249 lb 6.4 oz (113.1 kg)  12/26/23 249 lb (112.9 kg)  11/28/23 251 lb (113.9 kg)  11/02/23 253 lb (114.8 kg)  10/05/23 257 lb (116.6 kg)
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
## 2024-08-06 DIAGNOSIS — F411 Generalized anxiety disorder: Secondary | ICD-10-CM | POA: Diagnosis not present

## 2024-08-08 ENCOUNTER — Other Ambulatory Visit (HOSPITAL_COMMUNITY): Payer: Self-pay

## 2024-08-15 ENCOUNTER — Ambulatory Visit (INDEPENDENT_AMBULATORY_CARE_PROVIDER_SITE_OTHER): Admitting: Family Medicine

## 2024-08-15 ENCOUNTER — Other Ambulatory Visit (HOSPITAL_COMMUNITY): Payer: Self-pay

## 2024-08-15 ENCOUNTER — Other Ambulatory Visit: Payer: Self-pay

## 2024-08-15 VITALS — BP 112/73 | HR 83 | Temp 98.3°F | Ht 68.0 in | Wt 245.0 lb

## 2024-08-15 DIAGNOSIS — Z6837 Body mass index (BMI) 37.0-37.9, adult: Secondary | ICD-10-CM

## 2024-08-15 DIAGNOSIS — R7303 Prediabetes: Secondary | ICD-10-CM | POA: Diagnosis not present

## 2024-08-15 DIAGNOSIS — E669 Obesity, unspecified: Secondary | ICD-10-CM

## 2024-08-15 DIAGNOSIS — E782 Mixed hyperlipidemia: Secondary | ICD-10-CM

## 2024-08-15 DIAGNOSIS — R0602 Shortness of breath: Secondary | ICD-10-CM | POA: Diagnosis not present

## 2024-08-15 DIAGNOSIS — F5089 Other specified eating disorder: Secondary | ICD-10-CM

## 2024-08-15 DIAGNOSIS — I10 Essential (primary) hypertension: Secondary | ICD-10-CM

## 2024-08-15 DIAGNOSIS — R718 Other abnormality of red blood cells: Secondary | ICD-10-CM | POA: Diagnosis not present

## 2024-08-15 MED ORDER — METFORMIN HCL 500 MG PO TABS
500.0000 mg | ORAL_TABLET | Freq: Every day | ORAL | 0 refills | Status: DC
  Filled 2024-08-15 – 2024-08-28 (×2): qty 30, 30d supply, fill #0

## 2024-08-15 MED ORDER — PHENTERMINE HCL 37.5 MG PO TABS
18.7500 mg | ORAL_TABLET | Freq: Every morning | ORAL | 0 refills | Status: DC
  Filled 2024-08-15 – 2024-08-19 (×2): qty 30, 60d supply, fill #0

## 2024-08-15 MED ORDER — TOPIRAMATE 100 MG PO TABS
100.0000 mg | ORAL_TABLET | Freq: Every day | ORAL | 0 refills | Status: DC
Start: 2024-08-15 — End: 2024-09-25
  Filled 2024-08-15: qty 60, 60d supply, fill #0

## 2024-08-15 NOTE — Assessment & Plan Note (Addendum)
 Resting metabolic rate of 8026 in January of this year.  Symptoms have improved slightly since that time.  Repeat resting metabolic rate of 8429 today.  She is focusing on increasing her activity over the last 8 months.

## 2024-08-15 NOTE — Progress Notes (Signed)
 SUBJECTIVE:  Chief Complaint: Obesity  Interim History: patient has been doing as best as she can over the last few months.  She flew to [place]  as her father in law turned 55.  She has been walking and lifting weights and staying on her meal plan.  She is trying to switch it up and stay within her calorie and protein goal.  Calorie wise she is sticking close to her normal intake.  Starting to make plans for the holiday season.    Megan Mitchell is here to discuss her progress with her obesity treatment plan. She is on the keeping a food journal and adhering to recommended goals of 1500 calories and 90 grams of protein and states she is following her eating plan approximately 100 % of the time. She states she is walking and lifting weights 90 minutes 4 times per week.   OBJECTIVE: Visit Diagnoses: Problem List Items Addressed This Visit       Other   Hyperlipidemia   Relevant Orders   Lipid Panel With LDL/HDL Ratio   Eating disorder   Relevant Medications   phentermine  (ADIPEX-P ) 37.5 MG tablet   topiramate  (TOPAMAX ) 100 MG tablet   Prediabetes   Relevant Medications   metFORMIN  (GLUCOPHAGE ) 500 MG tablet   Other Relevant Orders   Hemoglobin A1c   Insulin , random   Shortness of breath on exertion   Other Visit Diagnoses       SOB (shortness of breath) [R06.02]    -  Primary     Essential hypertension       Relevant Orders   Comprehensive metabolic panel with GFR     Microcytosis       Relevant Orders   CBC w/Diff/Platelet   Anemia panel       Vitals Temp: 98.3 F (36.8 C) BP: 112/73 Pulse Rate: 83 SpO2: 100 %   Anthropometric Measurements Height: 5' 8 (1.727 m) Weight: 245 lb (111.1 kg) BMI (Calculated): 37.26 Weight at Last Visit: 246 lb Weight Lost Since Last Visit: 1 Weight Gained Since Last Visit: 0 Starting Weight: 273 lb Total Weight Loss (lbs): 28 lb (12.7 kg)   Body Composition  Body Fat %: 49.1 % Fat Mass (lbs): 120.6 lbs Muscle Mass (lbs):  118.8 lbs Total Body Water  (lbs): 90.8 lbs Visceral Fat Rating : 14   Other Clinical Data RMR: 1570 Fasting: yes Labs: yes Today's Visit #: 30 Starting Date: 02/17/22 Comments: 1500/90     ASSESSMENT AND PLAN: Assessment & Plan SOB (shortness of breath) [R06.02]  Shortness of breath on exertion Resting metabolic rate of 8026 in January of this year.  Symptoms have improved slightly since that time.  Repeat resting metabolic rate of 8429 today.  She is focusing on increasing her activity over the last 8 months.   Essential hypertension Blood pressure well-controlled today.  This is despite being on stimulant medication.  CMP ordered to assess electrolytes as well as kidney function.  Will plan to discuss lab results at next appointment.  No change in current blood pressure medication dosages or treatment plan. Prediabetes Patient has done well at modifying her dietary intake to limit simple carbohydrates.  Will repeat A1c as well as fasting insulin  level today.  Discussed labs at next appointment.  Needs a refill of metformin  today at same dosage. Mixed hyperlipidemia Fasting for labs as well as indirect calorimetry.  Lipid panel ordered.  Will risk stratify using ASCVD risk calculator at next appointment. Microcytosis History of microcytosis.  Will need CBC as well as anemia panel to assess severity at this time.  May need to plan for iron supplementation if anemia panel showing iron deficiency. Other disorder of eating Doing well on combination of phentermine  and topiramate .  No change in dosage today.  PDMP checked with no concerns.  Refill sent to pharmacy.   Diet: Liisa is currently in the action stage of change. As such, her goal is to continue with weight loss efforts and has agreed to keeping a food journal and adhering to recommended goals of 1400 calories and 90 or more grams of protein daily.   Exercise:  All adults should avoid inactivity. Some activity is better than  none, and adults who participate in any amount of physical activity, gain some health benefits.  Behavior Modification:  We discussed the following Behavioral Modification Strategies today: increasing lean protein intake, decreasing simple carbohydrates, increasing vegetables, planning for success, and keep a strict food journal. We discussed various medication options to help Denita with her weight loss efforts and we both agreed to continue phentermine  and topiramate  at current doses.  Return in about 8 weeks (around 10/10/2024).   She was informed of the importance of frequent follow up visits to maximize her success with intensive lifestyle modifications for her multiple health conditions.  Attestation Statements:   Reviewed by clinician on day of visit: allergies, medications, problem list, medical history, surgical history, family history, social history, and previous encounter notes.     [name], MD

## 2024-08-17 LAB — CBC WITH DIFFERENTIAL/PLATELET
Basophils Absolute: 0.1 x10E3/uL (ref 0.0–0.2)
Basos: 1 %
EOS (ABSOLUTE): 0.2 x10E3/uL (ref 0.0–0.4)
Eos: 3 %
Hemoglobin: 12.3 g/dL (ref 11.1–15.9)
Immature Grans (Abs): 0 x10E3/uL (ref 0.0–0.1)
Immature Granulocytes: 0 %
Lymphocytes Absolute: 3 x10E3/uL (ref 0.7–3.1)
Lymphs: 37 %
MCH: 25.6 pg — ABNORMAL LOW (ref 26.6–33.0)
MCHC: 31.7 g/dL (ref 31.5–35.7)
MCV: 81 fL (ref 79–97)
Monocytes Absolute: 0.3 x10E3/uL (ref 0.1–0.9)
Monocytes: 4 %
Neutrophils Absolute: 4.5 x10E3/uL (ref 1.4–7.0)
Neutrophils: 55 %
Platelets: 488 x10E3/uL — ABNORMAL HIGH (ref 150–450)
RBC: 4.81 x10E6/uL (ref 3.77–5.28)
RDW: 14.5 % (ref 11.7–15.4)
WBC: 8.2 x10E3/uL (ref 3.4–10.8)

## 2024-08-17 LAB — LIPID PANEL WITH LDL/HDL RATIO
Cholesterol, Total: 196 mg/dL (ref 100–199)
HDL: 77 mg/dL (ref >39)
LDL Chol Calc (NIH): 107 mg/dL — ABNORMAL HIGH (ref 0–99)
LDL/HDL Ratio: 1.4 ratio (ref 0.0–3.2)
Triglycerides: 64 mg/dL (ref 0–149)
VLDL Cholesterol Cal: 12 mg/dL (ref 5–40)

## 2024-08-17 LAB — COMPREHENSIVE METABOLIC PANEL WITH GFR
ALT: 12 IU/L (ref 0–32)
AST: 15 IU/L (ref 0–40)
Albumin: 4.6 g/dL (ref 3.8–4.9)
Alkaline Phosphatase: 106 IU/L (ref 49–135)
BUN/Creatinine Ratio: 19 (ref 9–23)
BUN: 16 mg/dL (ref 6–24)
Bilirubin Total: 0.4 mg/dL (ref 0.0–1.2)
CO2: 24 mmol/L (ref 20–29)
Calcium: 10.2 mg/dL (ref 8.7–10.2)
Chloride: 102 mmol/L (ref 96–106)
Creatinine, Ser: 0.86 mg/dL (ref 0.57–1.00)
Globulin, Total: 3.2 g/dL (ref 1.5–4.5)
Glucose: 85 mg/dL (ref 70–99)
Potassium: 3.5 mmol/L (ref 3.5–5.2)
Sodium: 141 mmol/L (ref 134–144)
Total Protein: 7.8 g/dL (ref 6.0–8.5)
eGFR: 80 mL/min/1.73 (ref >59)

## 2024-08-17 LAB — HEMOGLOBIN A1C
Est. average glucose Bld gHb Est-mCnc: 111 mg/dL
Hgb A1c MFr Bld: 5.5 % (ref 4.8–5.6)

## 2024-08-17 LAB — ANEMIA PANEL
Ferritin: 57 ng/mL (ref 15–150)
Folate, Hemolysate: 398 ng/mL
Folate, RBC: 1026 ng/mL (ref >498)
Hematocrit: 38.8 % (ref 34.0–46.6)
Iron Saturation: 11 % — ABNORMAL LOW (ref 15–55)
Iron: 44 ug/dL (ref 27–159)
Retic Ct Pct: 1.9 % (ref 0.6–2.6)
Total Iron Binding Capacity: 395 ug/dL (ref 250–450)
UIBC: 351 ug/dL (ref 131–425)
Vitamin B-12: 1215 pg/mL (ref 232–1245)

## 2024-08-17 LAB — INSULIN, RANDOM: INSULIN: 17.5 u[IU]/mL (ref 2.6–24.9)

## 2024-08-19 ENCOUNTER — Other Ambulatory Visit (HOSPITAL_COMMUNITY): Payer: Self-pay

## 2024-08-22 ENCOUNTER — Ambulatory Visit: Admitting: Family Medicine

## 2024-08-25 NOTE — Assessment & Plan Note (Signed)
 Doing well on combination of phentermine  and topiramate .  No change in dosage today.  PDMP checked with no concerns.  Refill sent to pharmacy.

## 2024-08-25 NOTE — Assessment & Plan Note (Signed)
 Patient has done well at modifying her dietary intake to limit simple carbohydrates.  Will repeat A1c as well as fasting insulin  level today.  Discussed labs at next appointment.  Needs a refill of metformin  today at same dosage.

## 2024-08-25 NOTE — Assessment & Plan Note (Signed)
 Fasting for labs as well as indirect calorimetry.  Lipid panel ordered.  Will risk stratify using ASCVD risk calculator at next appointment.

## 2024-08-27 ENCOUNTER — Ambulatory Visit: Admitting: Internal Medicine

## 2024-08-27 DIAGNOSIS — F411 Generalized anxiety disorder: Secondary | ICD-10-CM | POA: Diagnosis not present

## 2024-08-28 ENCOUNTER — Other Ambulatory Visit (HOSPITAL_COMMUNITY): Payer: Self-pay

## 2024-09-12 ENCOUNTER — Other Ambulatory Visit (HOSPITAL_COMMUNITY): Payer: Self-pay

## 2024-09-12 DIAGNOSIS — M4316 Spondylolisthesis, lumbar region: Secondary | ICD-10-CM | POA: Diagnosis not present

## 2024-09-12 DIAGNOSIS — S39012A Strain of muscle, fascia and tendon of lower back, initial encounter: Secondary | ICD-10-CM | POA: Diagnosis not present

## 2024-09-12 DIAGNOSIS — M47816 Spondylosis without myelopathy or radiculopathy, lumbar region: Secondary | ICD-10-CM | POA: Diagnosis not present

## 2024-09-13 ENCOUNTER — Other Ambulatory Visit (HOSPITAL_COMMUNITY): Payer: Self-pay

## 2024-09-13 MED ORDER — MELOXICAM 7.5 MG PO TABS
7.5000 mg | ORAL_TABLET | Freq: Two times a day (BID) | ORAL | 0 refills | Status: AC
  Filled 2024-09-13: qty 28, 14d supply, fill #0

## 2024-09-13 MED ORDER — TRAMADOL HCL 50 MG PO TABS
50.0000 mg | ORAL_TABLET | Freq: Four times a day (QID) | ORAL | 0 refills | Status: AC | PRN
  Filled 2024-09-13: qty 20, 5d supply, fill #0

## 2024-09-13 MED ORDER — CYCLOBENZAPRINE HCL 5 MG PO TABS
5.0000 mg | ORAL_TABLET | ORAL | 0 refills | Status: AC
  Filled 2024-09-13: qty 15, 5d supply, fill #0

## 2024-09-21 ENCOUNTER — Other Ambulatory Visit (HOSPITAL_COMMUNITY): Payer: Self-pay

## 2024-09-21 ENCOUNTER — Other Ambulatory Visit (INDEPENDENT_AMBULATORY_CARE_PROVIDER_SITE_OTHER): Payer: Self-pay | Admitting: Family Medicine

## 2024-09-21 DIAGNOSIS — R7303 Prediabetes: Secondary | ICD-10-CM

## 2024-09-23 ENCOUNTER — Telehealth (INDEPENDENT_AMBULATORY_CARE_PROVIDER_SITE_OTHER): Payer: Self-pay | Admitting: Family Medicine

## 2024-09-23 NOTE — Telephone Encounter (Signed)
 Pt called and is scheduled to see Lonell on 12/11 and will be out of her metformin  after this Wednesday. She has contacted the pharmacy about it.  I did offer her an appt tomorrow afternoon with Dana or Wednesday am for an appt but she is not available either days.

## 2024-09-23 NOTE — Telephone Encounter (Signed)
 Please advise

## 2024-09-24 NOTE — Telephone Encounter (Signed)
 Got her on the schedule for tomorrow.

## 2024-09-25 ENCOUNTER — Other Ambulatory Visit (HOSPITAL_COMMUNITY): Payer: Self-pay

## 2024-09-25 ENCOUNTER — Encounter (INDEPENDENT_AMBULATORY_CARE_PROVIDER_SITE_OTHER): Payer: Self-pay | Admitting: Family Medicine

## 2024-09-25 ENCOUNTER — Ambulatory Visit (INDEPENDENT_AMBULATORY_CARE_PROVIDER_SITE_OTHER): Admitting: Family Medicine

## 2024-09-25 ENCOUNTER — Other Ambulatory Visit: Payer: Self-pay

## 2024-09-25 VITALS — BP 109/70 | HR 79 | Temp 98.0°F | Ht 68.0 in | Wt 246.0 lb

## 2024-09-25 DIAGNOSIS — Z6837 Body mass index (BMI) 37.0-37.9, adult: Secondary | ICD-10-CM

## 2024-09-25 DIAGNOSIS — E669 Obesity, unspecified: Secondary | ICD-10-CM | POA: Diagnosis not present

## 2024-09-25 DIAGNOSIS — F5089 Other specified eating disorder: Secondary | ICD-10-CM

## 2024-09-25 DIAGNOSIS — R7303 Prediabetes: Secondary | ICD-10-CM | POA: Diagnosis not present

## 2024-09-25 DIAGNOSIS — E782 Mixed hyperlipidemia: Secondary | ICD-10-CM

## 2024-09-25 MED ORDER — PHENTERMINE HCL 37.5 MG PO TABS
18.7500 mg | ORAL_TABLET | Freq: Every morning | ORAL | 0 refills | Status: DC
  Filled 2024-09-25 – 2024-10-16 (×3): qty 30, 60d supply, fill #0

## 2024-09-25 MED ORDER — TOPIRAMATE 100 MG PO TABS
100.0000 mg | ORAL_TABLET | Freq: Every day | ORAL | 0 refills | Status: DC
  Filled 2024-09-25 – 2024-10-16 (×3): qty 60, 60d supply, fill #0

## 2024-09-25 MED ORDER — METFORMIN HCL 500 MG PO TABS
500.0000 mg | ORAL_TABLET | Freq: Every day | ORAL | 1 refills | Status: DC
  Filled 2024-09-25: qty 90, 90d supply, fill #0

## 2024-09-25 NOTE — Progress Notes (Signed)
 SUBJECTIVE:  Chief Complaint: Obesity  Interim History: Patient feeling well overall.  She just bought new pants and is feeling like they are fitting loosely.  She has been eating mostly on plan except for this past weekend when she was indulgent at her son and daughter in law's vow renewal.  For Thanksgiving she is planning to help her husband fix some food and she is anticipating having a few friends and few family members over.  She has to work Friday.  Megan Mitchell is here to discuss her progress with her obesity treatment plan. She is on the keeping a food journal and adhering to recommended goals of 1400 calories and 90 grams of protein and states she is following her eating plan approximately 90 % of the time. She states she is exercising 45 minutes 4 times per week.   OBJECTIVE: Visit Diagnoses: Problem List Items Addressed This Visit       Other   Hyperlipidemia - Primary   Eating disorder   Relevant Medications   phentermine  (ADIPEX-P ) 37.5 MG tablet   topiramate  (TOPAMAX ) 100 MG tablet   Prediabetes   Relevant Medications   metFORMIN  (GLUCOPHAGE ) 500 MG tablet   Other Visit Diagnoses       BMI 37.0-37.9, adult         Obesity with starting BMI of 41.5       Relevant Medications   metFORMIN  (GLUCOPHAGE ) 500 MG tablet   phentermine  (ADIPEX-P ) 37.5 MG tablet       Vitals Temp: 98 F (36.7 C) BP: 109/70 Pulse Rate: 79 SpO2: 100 %   Anthropometric Measurements Height: 5' 8 (1.727 m) Weight: 246 lb (111.6 kg) BMI (Calculated): 37.41 Weight at Last Visit: 245 lb Weight Lost Since Last Visit: 0 Weight Gained Since Last Visit: 1 Starting Weight: 273 lb Total Weight Loss (lbs): 27 lb (12.2 kg)   Body Composition  Body Fat %: 49.6 % Fat Mass (lbs): 122.4 lbs Muscle Mass (lbs): 118 lbs Total Body Water  (lbs): 92.6 lbs Visceral Fat Rating : 14   Other Clinical Data Today's Visit #: 31 Starting Date: 02/17/22 Comments: 1400/90     ASSESSMENT AND  PLAN: Assessment & Plan Mixed hyperlipidemia The 10-year ASCVD risk score (Arnett DK, et al., 2019) is: 2%   Values used to calculate the score:     Age: 53 years     Clincally relevant sex: Female     Is Non-Hispanic African American: Yes     Diabetic: No     Tobacco smoker: No     Systolic Blood Pressure: 109 mmHg     Is BP treated: Yes     HDL Cholesterol: 77 mg/dL     Total Cholesterol: 196 mg/dL Patient is stable on 20 mg statin.  Will reassess cholesterol send in 4 to 6 months to see if LDL has been able to return to normal range. Prediabetes Significant improvement in A1c from 6.0-5.5.  Patient has been working on intensive lifestyle modifications which includes increased physical activity which is definitely helping with blood sugar control.  She is limiting her simple carbohydrate intake daily.  Continue current treatment plan. Other disorder of eating Patient started makeshift Qsymia on April 11, 2023 at 265 pounds.  She is now at 246 pounds with a 19 pound weight loss which is above the 5% goal.  She has been able to be consistent in her implementation of physical activity.  She has been able to stay consistent in her 5% loss.  PDMP checked with no concerns today.  2 months worth of prescription sent into pharmacy. BMI 37.0-37.9, adult  Obesity with starting BMI of 41.5    Diet: Megan Mitchell is currently in the action stage of change. As such, her goal is to continue with weight loss efforts and has agreed to keeping a food journal and adhering to recommended goals of 1400 calories and 90 or more grams of protein daily.   Exercise:  For additional and more extensive health benefits, adults should increase their aerobic physical activity to 300 minutes (5 hours) a week of moderate-intensity, or 150 minutes a week of vigorous-intensity aerobic physical activity, or an equivalent combination of moderate- and vigorous-intensity activity. Additional health benefits are gained by engaging  in physical activity beyond this amount.   Behavior Modification:  We discussed the following Behavioral Modification Strategies today: increasing lean protein intake, decreasing simple carbohydrates, increasing vegetables, meal planning and cooking strategies, planning for success, and keep a strict food journal. We discussed various medication options to help Megan Mitchell with her weight loss efforts and we both agreed to continue phentermine  and topiramate  at current dosage..  Return in about 8 weeks (around 11/20/2024).   She was informed of the importance of frequent follow up visits to maximize her success with intensive lifestyle modifications for her multiple health conditions.  Attestation Statements:   Reviewed by clinician on day of visit: allergies, medications, problem list, medical history, surgical history, family history, social history, and previous encounter notes.     [name], MD

## 2024-09-25 NOTE — Assessment & Plan Note (Addendum)
 The 10-year ASCVD risk score (Arnett DK, et al., 2019) is: 2%   Values used to calculate the score:     Age: 53 years     Clincally relevant sex: Female     Is Non-Hispanic African American: Yes     Diabetic: No     Tobacco smoker: No     Systolic Blood Pressure: 109 mmHg     Is BP treated: Yes     HDL Cholesterol: 77 mg/dL     Total Cholesterol: 196 mg/dL Patient is stable on 20 mg statin.  Will reassess cholesterol send in 4 to 6 months to see if LDL has been able to return to normal range.

## 2024-09-29 NOTE — Assessment & Plan Note (Signed)
 Patient started makeshift Qsymia on April 11, 2023 at 265 pounds.  She is now at 246 pounds with a 19 pound weight loss which is above the 5% goal.  She has been able to be consistent in her implementation of physical activity.  She has been able to stay consistent in her 5% loss.  PDMP checked with no concerns today.  2 months worth of prescription sent into pharmacy.

## 2024-09-29 NOTE — Assessment & Plan Note (Signed)
 Significant improvement in A1c from 6.0-5.5.  Patient has been working on intensive lifestyle modifications which includes increased physical activity which is definitely helping with blood sugar control.  She is limiting her simple carbohydrate intake daily.  Continue current treatment plan.

## 2024-10-09 ENCOUNTER — Other Ambulatory Visit (HOSPITAL_COMMUNITY): Payer: Self-pay

## 2024-10-09 MED ORDER — METHOCARBAMOL 750 MG PO TABS
750.0000 mg | ORAL_TABLET | Freq: Three times a day (TID) | ORAL | 0 refills | Status: AC
  Filled 2024-10-09: qty 90, 30d supply, fill #0

## 2024-10-10 ENCOUNTER — Ambulatory Visit (INDEPENDENT_AMBULATORY_CARE_PROVIDER_SITE_OTHER): Payer: Self-pay | Admitting: Nurse Practitioner

## 2024-10-14 ENCOUNTER — Other Ambulatory Visit (HOSPITAL_COMMUNITY): Payer: Self-pay

## 2024-10-14 DIAGNOSIS — M4316 Spondylolisthesis, lumbar region: Secondary | ICD-10-CM | POA: Diagnosis not present

## 2024-10-16 ENCOUNTER — Other Ambulatory Visit: Payer: Self-pay

## 2024-10-16 ENCOUNTER — Other Ambulatory Visit (HOSPITAL_COMMUNITY): Payer: Self-pay

## 2024-10-21 DIAGNOSIS — M4316 Spondylolisthesis, lumbar region: Secondary | ICD-10-CM | POA: Diagnosis not present

## 2024-10-21 DIAGNOSIS — M47816 Spondylosis without myelopathy or radiculopathy, lumbar region: Secondary | ICD-10-CM | POA: Diagnosis not present

## 2024-10-22 ENCOUNTER — Ambulatory Visit (INDEPENDENT_AMBULATORY_CARE_PROVIDER_SITE_OTHER): Admitting: Family Medicine

## 2024-10-23 ENCOUNTER — Other Ambulatory Visit (HOSPITAL_COMMUNITY): Payer: Self-pay

## 2024-10-23 ENCOUNTER — Other Ambulatory Visit: Payer: Self-pay | Admitting: Physician Assistant

## 2024-10-23 DIAGNOSIS — I10 Essential (primary) hypertension: Secondary | ICD-10-CM

## 2024-10-23 MED ORDER — SERTRALINE HCL 100 MG PO TABS
100.0000 mg | ORAL_TABLET | Freq: Every day | ORAL | 1 refills | Status: AC
  Filled 2024-10-23 (×2): qty 90, 90d supply, fill #0

## 2024-10-23 MED ORDER — TRIAMTERENE-HCTZ 37.5-25 MG PO TABS
1.0000 | ORAL_TABLET | Freq: Every day | ORAL | 0 refills | Status: AC
  Filled 2024-10-23 (×2): qty 90, 90d supply, fill #0

## 2024-10-25 ENCOUNTER — Ambulatory Visit: Admitting: Family Medicine

## 2024-10-25 NOTE — Progress Notes (Deleted)
" ° °  Acute Office Visit  Subjective:     Patient ID: DEMECIA Mitchell, female    DOB: [birth year], 54 y.o.   MRN: [id number]  No chief complaint on file.   HPI  Discussed the use of AI scribe software for clinical note transcription with the patient, who gave verbal consent to proceed.  History of Present Illness      ROS Per HPI      Objective:    LMP 06/14/2020    Physical Exam Vitals and nursing note reviewed.  Constitutional:      General: She is not in acute distress.    Appearance: Normal appearance. She is normal weight.  HENT:     Head: Normocephalic and atraumatic.     Right Ear: External ear normal.     Left Ear: External ear normal.     Nose: Nose normal.     Mouth/Throat:     Mouth: Mucous membranes are moist.     Pharynx: Oropharynx is clear.  Eyes:     Extraocular Movements: Extraocular movements intact.     Pupils: Pupils are equal, round, and reactive to light.  Cardiovascular:     Rate and Rhythm: Normal rate and regular rhythm.     Pulses: Normal pulses.     Heart sounds: Normal heart sounds.  Pulmonary:     Effort: Pulmonary effort is normal. No respiratory distress.     Breath sounds: Normal breath sounds. No wheezing, rhonchi or rales.  Musculoskeletal:        General: Normal range of motion.     Cervical back: Normal range of motion.     Right lower leg: No edema.     Left lower leg: No edema.  Lymphadenopathy:     Cervical: No cervical adenopathy.  Neurological:     General: No focal deficit present.     Mental Status: She is alert and oriented to person, place, and time.  Psychiatric:        Mood and Affect: Mood normal.        Thought Content: Thought content normal.     No results found for any visits on 10/25/24.      Assessment & Plan:   Assessment and Plan Assessment & Plan      No orders of the defined types were placed in this encounter.    No orders of the defined types were placed in this encounter.   No  follow-ups on file.  Corean LITTIE Ku, FNP  "

## 2024-11-12 ENCOUNTER — Other Ambulatory Visit (HOSPITAL_COMMUNITY): Payer: Self-pay

## 2024-11-13 ENCOUNTER — Other Ambulatory Visit (HOSPITAL_COMMUNITY): Payer: Self-pay

## 2024-11-15 ENCOUNTER — Other Ambulatory Visit: Payer: Self-pay | Admitting: Physician Assistant

## 2024-11-15 DIAGNOSIS — Z1231 Encounter for screening mammogram for malignant neoplasm of breast: Secondary | ICD-10-CM

## 2024-11-20 ENCOUNTER — Ambulatory Visit (INDEPENDENT_AMBULATORY_CARE_PROVIDER_SITE_OTHER): Admitting: Family Medicine

## 2024-11-28 ENCOUNTER — Ambulatory Visit (INDEPENDENT_AMBULATORY_CARE_PROVIDER_SITE_OTHER): Admitting: Family Medicine

## 2024-11-28 ENCOUNTER — Other Ambulatory Visit (HOSPITAL_COMMUNITY): Payer: Self-pay

## 2024-11-28 ENCOUNTER — Other Ambulatory Visit: Payer: Self-pay

## 2024-11-28 VITALS — BP 118/77 | HR 79 | Temp 98.5°F | Ht 68.0 in | Wt 242.0 lb

## 2024-11-28 DIAGNOSIS — R7303 Prediabetes: Secondary | ICD-10-CM

## 2024-11-28 DIAGNOSIS — F5089 Other specified eating disorder: Secondary | ICD-10-CM

## 2024-11-28 DIAGNOSIS — Z6836 Body mass index (BMI) 36.0-36.9, adult: Secondary | ICD-10-CM

## 2024-11-28 MED ORDER — METFORMIN HCL 500 MG PO TABS
500.0000 mg | ORAL_TABLET | Freq: Every day | ORAL | 1 refills | Status: AC
  Filled 2024-11-28: qty 90, 90d supply, fill #0

## 2024-11-28 MED ORDER — PHENTERMINE HCL 37.5 MG PO TABS
37.5000 mg | ORAL_TABLET | Freq: Every day | ORAL | 0 refills | Status: AC
  Filled 2024-11-28: qty 30, 30d supply, fill #0

## 2024-11-28 MED ORDER — PHENTERMINE HCL 37.5 MG PO TABS
37.5000 mg | ORAL_TABLET | Freq: Every morning | ORAL | 0 refills | Status: AC
  Filled 2024-11-28: qty 30, 30d supply, fill #0

## 2024-11-28 MED ORDER — TOPIRAMATE 100 MG PO TABS
100.0000 mg | ORAL_TABLET | Freq: Every day | ORAL | 0 refills | Status: AC
  Filled 2024-11-28: qty 90, 90d supply, fill #0
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
## 2025-01-09 ENCOUNTER — Ambulatory Visit (INDEPENDENT_AMBULATORY_CARE_PROVIDER_SITE_OTHER): Admitting: Family Medicine

## 2025-02-04 ENCOUNTER — Ambulatory Visit

## 2025-02-06 ENCOUNTER — Ambulatory Visit
